# Patient Record
Sex: Female | Born: 1965 | Race: Black or African American | Hispanic: No | Marital: Single | Smoking: Never smoker
Health system: Southern US, Community
[De-identification: ages and names within clinical notes are randomized; demographics above are authoritative.]

## PROBLEM LIST (undated history)

## (undated) ENCOUNTER — Emergency Department (HOSPITAL_COMMUNITY): Admission: EM | Payer: Medicaid Other | Source: Home / Self Care

## (undated) DIAGNOSIS — G4730 Sleep apnea, unspecified: Secondary | ICD-10-CM

## (undated) DIAGNOSIS — T7840XA Allergy, unspecified, initial encounter: Secondary | ICD-10-CM

## (undated) DIAGNOSIS — S62101A Fracture of unspecified carpal bone, right wrist, initial encounter for closed fracture: Secondary | ICD-10-CM

## (undated) DIAGNOSIS — H6690 Otitis media, unspecified, unspecified ear: Secondary | ICD-10-CM

## (undated) DIAGNOSIS — D649 Anemia, unspecified: Secondary | ICD-10-CM

## (undated) DIAGNOSIS — G4733 Obstructive sleep apnea (adult) (pediatric): Secondary | ICD-10-CM

## (undated) DIAGNOSIS — F329 Major depressive disorder, single episode, unspecified: Secondary | ICD-10-CM

## (undated) DIAGNOSIS — R0902 Hypoxemia: Secondary | ICD-10-CM

## (undated) DIAGNOSIS — F419 Anxiety disorder, unspecified: Secondary | ICD-10-CM

## (undated) DIAGNOSIS — I10 Essential (primary) hypertension: Secondary | ICD-10-CM

## (undated) DIAGNOSIS — F32A Depression, unspecified: Secondary | ICD-10-CM

## (undated) DIAGNOSIS — Z87410 Personal history of cervical dysplasia: Secondary | ICD-10-CM

## (undated) DIAGNOSIS — I272 Pulmonary hypertension, unspecified: Secondary | ICD-10-CM

## (undated) DIAGNOSIS — B20 Human immunodeficiency virus [HIV] disease: Secondary | ICD-10-CM

## (undated) DIAGNOSIS — Z8619 Personal history of other infectious and parasitic diseases: Secondary | ICD-10-CM

## (undated) DIAGNOSIS — J45909 Unspecified asthma, uncomplicated: Secondary | ICD-10-CM

## (undated) DIAGNOSIS — I2510 Atherosclerotic heart disease of native coronary artery without angina pectoris: Secondary | ICD-10-CM

## (undated) DIAGNOSIS — K219 Gastro-esophageal reflux disease without esophagitis: Secondary | ICD-10-CM

## (undated) DIAGNOSIS — S62102A Fracture of unspecified carpal bone, left wrist, initial encounter for closed fracture: Secondary | ICD-10-CM

## (undated) DIAGNOSIS — E669 Obesity, unspecified: Secondary | ICD-10-CM

## (undated) DIAGNOSIS — E119 Type 2 diabetes mellitus without complications: Secondary | ICD-10-CM

## (undated) DIAGNOSIS — M1990 Unspecified osteoarthritis, unspecified site: Secondary | ICD-10-CM

## (undated) DIAGNOSIS — E785 Hyperlipidemia, unspecified: Secondary | ICD-10-CM

## (undated) DIAGNOSIS — I5032 Chronic diastolic (congestive) heart failure: Secondary | ICD-10-CM

## (undated) HISTORY — PX: TYMPANOSTOMY TUBE PLACEMENT: SHX32

## (undated) HISTORY — DX: Anemia, unspecified: D64.9

## (undated) HISTORY — DX: Essential (primary) hypertension: I10

## (undated) HISTORY — DX: Anxiety disorder, unspecified: F41.9

## (undated) HISTORY — DX: Gastro-esophageal reflux disease without esophagitis: K21.9

## (undated) HISTORY — DX: Obesity, unspecified: E66.9

## (undated) HISTORY — DX: Otitis media, unspecified, unspecified ear: H66.90

## (undated) HISTORY — DX: Human immunodeficiency virus (HIV) disease: B20

## (undated) HISTORY — DX: Depression, unspecified: F32.A

## (undated) HISTORY — DX: Fracture of unspecified carpal bone, right wrist, initial encounter for closed fracture: S62.102A

## (undated) HISTORY — DX: Hyperlipidemia, unspecified: E78.5

## (undated) HISTORY — DX: Type 2 diabetes mellitus without complications: E11.9

## (undated) HISTORY — DX: Fracture of unspecified carpal bone, right wrist, initial encounter for closed fracture: S62.101A

## (undated) HISTORY — DX: Sleep apnea, unspecified: G47.30

## (undated) HISTORY — DX: Personal history of cervical dysplasia: Z87.410

## (undated) HISTORY — DX: Obstructive sleep apnea (adult) (pediatric): G47.33

## (undated) HISTORY — DX: Chronic diastolic (congestive) heart failure: I50.32

## (undated) HISTORY — DX: Major depressive disorder, single episode, unspecified: F32.9

## (undated) HISTORY — DX: Hypoxemia: R09.02

## (undated) HISTORY — DX: Pulmonary hypertension, unspecified: I27.20

## (undated) HISTORY — DX: Personal history of other infectious and parasitic diseases: Z86.19

## (undated) HISTORY — DX: Unspecified asthma, uncomplicated: J45.909

## (undated) HISTORY — PX: ADENOIDECTOMY: SHX5191

## (undated) HISTORY — DX: Unspecified osteoarthritis, unspecified site: M19.90

## (undated) HISTORY — DX: Allergy, unspecified, initial encounter: T78.40XA

---

## 1999-11-17 DIAGNOSIS — B20 Human immunodeficiency virus [HIV] disease: Secondary | ICD-10-CM

## 1999-11-17 HISTORY — DX: Human immunodeficiency virus (HIV) disease: B20

## 2002-06-21 DIAGNOSIS — B20 Human immunodeficiency virus [HIV] disease: Secondary | ICD-10-CM | POA: Insufficient documentation

## 2002-06-23 ENCOUNTER — Encounter (INDEPENDENT_AMBULATORY_CARE_PROVIDER_SITE_OTHER): Payer: Self-pay | Admitting: *Deleted

## 2002-06-23 LAB — CONVERTED CEMR LAB: CD4 Count: 200 microliters

## 2002-07-11 ENCOUNTER — Encounter: Payer: Self-pay | Admitting: *Deleted

## 2002-07-11 ENCOUNTER — Ambulatory Visit (HOSPITAL_COMMUNITY): Admission: RE | Admit: 2002-07-11 | Discharge: 2002-07-11 | Payer: Self-pay | Admitting: *Deleted

## 2002-07-27 ENCOUNTER — Encounter: Admission: RE | Admit: 2002-07-27 | Discharge: 2002-07-27 | Payer: Self-pay | Admitting: *Deleted

## 2002-07-31 ENCOUNTER — Encounter: Admission: RE | Admit: 2002-07-31 | Discharge: 2002-07-31 | Payer: Self-pay | Admitting: Internal Medicine

## 2002-08-17 ENCOUNTER — Encounter: Admission: RE | Admit: 2002-08-17 | Discharge: 2002-08-17 | Payer: Self-pay | Admitting: *Deleted

## 2002-08-24 ENCOUNTER — Encounter: Admission: RE | Admit: 2002-08-24 | Discharge: 2002-08-24 | Payer: Self-pay | Admitting: *Deleted

## 2002-09-06 ENCOUNTER — Inpatient Hospital Stay (HOSPITAL_COMMUNITY): Admission: RE | Admit: 2002-09-06 | Discharge: 2002-09-06 | Payer: Self-pay | Admitting: *Deleted

## 2002-09-13 ENCOUNTER — Inpatient Hospital Stay (HOSPITAL_COMMUNITY): Admission: AD | Admit: 2002-09-13 | Discharge: 2002-09-19 | Payer: Self-pay | Admitting: *Deleted

## 2002-09-13 ENCOUNTER — Encounter: Admission: RE | Admit: 2002-09-13 | Discharge: 2002-09-13 | Payer: Self-pay | Admitting: *Deleted

## 2002-09-13 ENCOUNTER — Encounter: Payer: Self-pay | Admitting: Obstetrics and Gynecology

## 2002-09-16 ENCOUNTER — Encounter (INDEPENDENT_AMBULATORY_CARE_PROVIDER_SITE_OTHER): Payer: Self-pay | Admitting: Specialist

## 2002-11-16 HISTORY — PX: NASAL SINUS SURGERY: SHX719

## 2002-12-05 ENCOUNTER — Ambulatory Visit (HOSPITAL_COMMUNITY): Admission: RE | Admit: 2002-12-05 | Discharge: 2002-12-05 | Payer: Self-pay | Admitting: Internal Medicine

## 2002-12-05 ENCOUNTER — Encounter: Admission: RE | Admit: 2002-12-05 | Discharge: 2002-12-05 | Payer: Self-pay | Admitting: Internal Medicine

## 2002-12-14 ENCOUNTER — Encounter: Admission: RE | Admit: 2002-12-14 | Discharge: 2002-12-14 | Payer: Self-pay | Admitting: Infectious Diseases

## 2002-12-26 ENCOUNTER — Encounter: Admission: RE | Admit: 2002-12-26 | Discharge: 2002-12-26 | Payer: Self-pay | Admitting: Internal Medicine

## 2003-02-14 ENCOUNTER — Ambulatory Visit (HOSPITAL_COMMUNITY): Admission: RE | Admit: 2003-02-14 | Discharge: 2003-02-15 | Payer: Self-pay | Admitting: Otolaryngology

## 2003-02-14 ENCOUNTER — Encounter (INDEPENDENT_AMBULATORY_CARE_PROVIDER_SITE_OTHER): Payer: Self-pay | Admitting: Specialist

## 2003-05-17 ENCOUNTER — Encounter: Admission: RE | Admit: 2003-05-17 | Discharge: 2003-05-17 | Payer: Self-pay | Admitting: Obstetrics and Gynecology

## 2003-05-17 ENCOUNTER — Encounter (INDEPENDENT_AMBULATORY_CARE_PROVIDER_SITE_OTHER): Payer: Self-pay | Admitting: Specialist

## 2003-06-12 ENCOUNTER — Encounter: Admission: RE | Admit: 2003-06-12 | Discharge: 2003-06-12 | Payer: Self-pay | Admitting: Obstetrics and Gynecology

## 2003-09-12 ENCOUNTER — Ambulatory Visit (HOSPITAL_COMMUNITY): Admission: RE | Admit: 2003-09-12 | Discharge: 2003-09-12 | Payer: Self-pay | Admitting: Infectious Diseases

## 2003-09-12 ENCOUNTER — Encounter: Payer: Self-pay | Admitting: Infectious Diseases

## 2003-09-12 ENCOUNTER — Encounter: Admission: RE | Admit: 2003-09-12 | Discharge: 2003-09-12 | Payer: Self-pay | Admitting: Infectious Diseases

## 2004-01-21 ENCOUNTER — Emergency Department (HOSPITAL_COMMUNITY): Admission: EM | Admit: 2004-01-21 | Discharge: 2004-01-22 | Payer: Self-pay | Admitting: Emergency Medicine

## 2004-06-02 ENCOUNTER — Ambulatory Visit (HOSPITAL_BASED_OUTPATIENT_CLINIC_OR_DEPARTMENT_OTHER): Admission: RE | Admit: 2004-06-02 | Discharge: 2004-06-02 | Payer: Self-pay | Admitting: Otolaryngology

## 2004-06-24 ENCOUNTER — Ambulatory Visit (HOSPITAL_COMMUNITY): Admission: RE | Admit: 2004-06-24 | Discharge: 2004-06-24 | Payer: Self-pay | Admitting: Infectious Diseases

## 2004-06-24 ENCOUNTER — Encounter: Admission: RE | Admit: 2004-06-24 | Discharge: 2004-06-24 | Payer: Self-pay | Admitting: Internal Medicine

## 2004-07-14 ENCOUNTER — Encounter: Admission: RE | Admit: 2004-07-14 | Discharge: 2004-07-14 | Payer: Self-pay | Admitting: Infectious Diseases

## 2004-08-25 ENCOUNTER — Emergency Department (HOSPITAL_COMMUNITY): Admission: EM | Admit: 2004-08-25 | Discharge: 2004-08-25 | Payer: Self-pay | Admitting: Family Medicine

## 2004-08-27 ENCOUNTER — Ambulatory Visit: Payer: Self-pay | Admitting: Infectious Diseases

## 2004-09-29 ENCOUNTER — Ambulatory Visit: Payer: Self-pay | Admitting: Infectious Diseases

## 2004-11-16 HISTORY — PX: WRIST FRACTURE SURGERY: SHX121

## 2004-11-18 ENCOUNTER — Encounter (INDEPENDENT_AMBULATORY_CARE_PROVIDER_SITE_OTHER): Payer: Self-pay | Admitting: *Deleted

## 2004-11-18 ENCOUNTER — Ambulatory Visit: Payer: Self-pay | Admitting: Family Medicine

## 2004-12-26 ENCOUNTER — Ambulatory Visit: Payer: Self-pay | Admitting: Infectious Diseases

## 2005-01-02 ENCOUNTER — Emergency Department (HOSPITAL_COMMUNITY): Admission: EM | Admit: 2005-01-02 | Discharge: 2005-01-02 | Payer: Self-pay | Admitting: Family Medicine

## 2005-01-05 ENCOUNTER — Ambulatory Visit: Payer: Self-pay | Admitting: Infectious Diseases

## 2005-01-05 ENCOUNTER — Encounter (INDEPENDENT_AMBULATORY_CARE_PROVIDER_SITE_OTHER): Payer: Self-pay | Admitting: *Deleted

## 2005-01-05 ENCOUNTER — Ambulatory Visit (HOSPITAL_COMMUNITY): Admission: RE | Admit: 2005-01-05 | Discharge: 2005-01-05 | Payer: Self-pay | Admitting: Infectious Diseases

## 2005-01-05 LAB — CONVERTED CEMR LAB: CD4 Count: 280 microliters

## 2005-02-01 ENCOUNTER — Emergency Department (HOSPITAL_COMMUNITY): Admission: EM | Admit: 2005-02-01 | Discharge: 2005-02-01 | Payer: Self-pay | Admitting: Family Medicine

## 2005-02-16 ENCOUNTER — Inpatient Hospital Stay (HOSPITAL_COMMUNITY): Admission: AC | Admit: 2005-02-16 | Discharge: 2005-02-23 | Payer: Self-pay

## 2005-02-16 ENCOUNTER — Ambulatory Visit: Payer: Self-pay | Admitting: Internal Medicine

## 2005-02-26 ENCOUNTER — Encounter: Admission: RE | Admit: 2005-02-26 | Discharge: 2005-05-27 | Payer: Self-pay | Admitting: Orthopedic Surgery

## 2005-02-27 ENCOUNTER — Emergency Department (HOSPITAL_COMMUNITY): Admission: EM | Admit: 2005-02-27 | Discharge: 2005-02-27 | Payer: Self-pay | Admitting: Family Medicine

## 2005-04-15 ENCOUNTER — Ambulatory Visit: Payer: Self-pay | Admitting: Infectious Diseases

## 2005-05-28 ENCOUNTER — Encounter: Admission: RE | Admit: 2005-05-28 | Discharge: 2005-06-08 | Payer: Self-pay | Admitting: Orthopedic Surgery

## 2005-08-19 ENCOUNTER — Ambulatory Visit (HOSPITAL_COMMUNITY): Admission: RE | Admit: 2005-08-19 | Discharge: 2005-08-19 | Payer: Self-pay | Admitting: Infectious Diseases

## 2005-08-19 ENCOUNTER — Ambulatory Visit: Payer: Self-pay | Admitting: Infectious Diseases

## 2005-08-19 ENCOUNTER — Encounter (INDEPENDENT_AMBULATORY_CARE_PROVIDER_SITE_OTHER): Payer: Self-pay | Admitting: *Deleted

## 2005-09-21 ENCOUNTER — Ambulatory Visit: Payer: Self-pay | Admitting: Infectious Diseases

## 2006-02-22 ENCOUNTER — Ambulatory Visit: Payer: Self-pay | Admitting: Infectious Diseases

## 2007-01-10 ENCOUNTER — Encounter (INDEPENDENT_AMBULATORY_CARE_PROVIDER_SITE_OTHER): Payer: Self-pay | Admitting: *Deleted

## 2007-01-10 LAB — CONVERTED CEMR LAB: Pap Smear: ABNORMAL

## 2007-01-23 ENCOUNTER — Encounter (INDEPENDENT_AMBULATORY_CARE_PROVIDER_SITE_OTHER): Payer: Self-pay | Admitting: *Deleted

## 2007-03-22 ENCOUNTER — Ambulatory Visit: Payer: Self-pay | Admitting: Infectious Diseases

## 2007-03-22 ENCOUNTER — Encounter: Admission: RE | Admit: 2007-03-22 | Discharge: 2007-03-22 | Payer: Self-pay | Admitting: Infectious Diseases

## 2007-03-22 DIAGNOSIS — H6690 Otitis media, unspecified, unspecified ear: Secondary | ICD-10-CM | POA: Insufficient documentation

## 2007-03-22 DIAGNOSIS — I10 Essential (primary) hypertension: Secondary | ICD-10-CM

## 2007-03-22 LAB — CONVERTED CEMR LAB
ALT: 27 units/L (ref 0–35)
CO2: 28 meq/L (ref 19–32)
Cholesterol: 137 mg/dL (ref 0–200)
Creatinine, Ser: 0.72 mg/dL (ref 0.40–1.20)
HCT: 41.4 % (ref 36.0–46.0)
HDL: 34 mg/dL — ABNORMAL LOW (ref >39)
HIV 1 RNA Quant: 8230 copies/mL — ABNORMAL HIGH (ref <50)
MCV: 86.4 fL (ref 78.0–100.0)
Platelets: 227 10*3/uL (ref 150–400)
RDW: 14 % (ref 11.5–14.0)
Total Bilirubin: 0.5 mg/dL (ref 0.3–1.2)
Total CHOL/HDL Ratio: 4
VLDL: 30 mg/dL (ref 0–40)
WBC: 3.5 10*3/uL — ABNORMAL LOW (ref 4.0–10.5)

## 2007-04-20 ENCOUNTER — Telehealth: Payer: Self-pay

## 2007-06-02 ENCOUNTER — Encounter (INDEPENDENT_AMBULATORY_CARE_PROVIDER_SITE_OTHER): Payer: Self-pay | Admitting: *Deleted

## 2007-08-18 ENCOUNTER — Encounter: Payer: Self-pay | Admitting: Infectious Diseases

## 2007-08-24 ENCOUNTER — Encounter: Admission: RE | Admit: 2007-08-24 | Discharge: 2007-08-24 | Payer: Self-pay | Admitting: Family Medicine

## 2007-09-06 ENCOUNTER — Encounter: Payer: Self-pay | Admitting: Infectious Diseases

## 2007-09-06 ENCOUNTER — Ambulatory Visit: Payer: Self-pay | Admitting: Internal Medicine

## 2007-09-06 ENCOUNTER — Encounter: Admission: RE | Admit: 2007-09-06 | Discharge: 2007-09-06 | Payer: Self-pay | Admitting: Internal Medicine

## 2007-09-06 LAB — CONVERTED CEMR LAB
AST: 29 units/L (ref 0–37)
Alkaline Phosphatase: 44 units/L (ref 39–117)
BUN: 12 mg/dL (ref 6–23)
Basophils Relative: 0 % (ref 0–1)
Creatinine, Ser: 0.74 mg/dL (ref 0.40–1.20)
Eosinophils Absolute: 0 10*3/uL (ref 0.0–0.7)
Eosinophils Relative: 1 % (ref 0–5)
HCT: 37.8 % (ref 36.0–46.0)
HIV-1 RNA Quant, Log: 4.45 — ABNORMAL HIGH (ref <1.70)
Hemoglobin: 12 g/dL (ref 12.0–15.0)
MCHC: 31.7 g/dL (ref 30.0–36.0)
MCV: 88.3 fL (ref 78.0–100.0)
Monocytes Absolute: 0.3 10*3/uL (ref 0.2–0.7)
Monocytes Relative: 10 % (ref 3–11)
Neutrophils Relative %: 43 % (ref 43–77)
RBC: 4.28 M/uL (ref 3.87–5.11)

## 2007-09-15 ENCOUNTER — Emergency Department (HOSPITAL_COMMUNITY): Admission: EM | Admit: 2007-09-15 | Discharge: 2007-09-15 | Payer: Self-pay | Admitting: Emergency Medicine

## 2007-09-15 DIAGNOSIS — B029 Zoster without complications: Secondary | ICD-10-CM | POA: Insufficient documentation

## 2007-09-20 ENCOUNTER — Ambulatory Visit: Payer: Self-pay | Admitting: Infectious Diseases

## 2007-10-06 ENCOUNTER — Emergency Department (HOSPITAL_COMMUNITY): Admission: EM | Admit: 2007-10-06 | Discharge: 2007-10-07 | Payer: Self-pay | Admitting: Emergency Medicine

## 2007-11-04 ENCOUNTER — Encounter: Payer: Self-pay | Admitting: Internal Medicine

## 2007-12-19 ENCOUNTER — Encounter: Admission: RE | Admit: 2007-12-19 | Discharge: 2007-12-19 | Payer: Self-pay | Admitting: Infectious Diseases

## 2007-12-19 ENCOUNTER — Ambulatory Visit: Payer: Self-pay | Admitting: Infectious Diseases

## 2007-12-19 ENCOUNTER — Encounter: Payer: Self-pay | Admitting: Internal Medicine

## 2007-12-19 DIAGNOSIS — N879 Dysplasia of cervix uteri, unspecified: Secondary | ICD-10-CM | POA: Insufficient documentation

## 2007-12-19 LAB — CONVERTED CEMR LAB
CO2: 28 meq/L (ref 19–32)
Calcium: 9.1 mg/dL (ref 8.4–10.5)
Chloride: 101 meq/L (ref 96–112)
Cholesterol: 137 mg/dL (ref 0–200)
Creatinine, Ser: 0.75 mg/dL (ref 0.40–1.20)
Glucose, Bld: 97 mg/dL (ref 70–99)
HCT: 41.6 % (ref 36.0–46.0)
MCV: 87.4 fL (ref 78.0–100.0)
RBC: 4.76 M/uL (ref 3.87–5.11)
Sodium: 140 meq/L (ref 135–145)
Total Bilirubin: 0.4 mg/dL (ref 0.3–1.2)
Total Protein: 8.7 g/dL — ABNORMAL HIGH (ref 6.0–8.3)
Triglycerides: 111 mg/dL (ref <150)
VLDL: 22 mg/dL (ref 0–40)
WBC: 3.1 10*3/uL — ABNORMAL LOW (ref 4.0–10.5)

## 2007-12-21 ENCOUNTER — Encounter: Payer: Self-pay | Admitting: Infectious Diseases

## 2007-12-21 LAB — CONVERTED CEMR LAB: HIV 1 RNA Quant: 33400 copies/mL — ABNORMAL HIGH (ref <50)

## 2008-02-29 ENCOUNTER — Ambulatory Visit: Payer: Self-pay | Admitting: Infectious Diseases

## 2008-02-29 ENCOUNTER — Encounter: Payer: Self-pay | Admitting: Internal Medicine

## 2008-02-29 ENCOUNTER — Encounter: Admission: RE | Admit: 2008-02-29 | Discharge: 2008-02-29 | Payer: Self-pay | Admitting: Infectious Diseases

## 2008-02-29 LAB — CONVERTED CEMR LAB
Albumin: 3.9 g/dL (ref 3.5–5.2)
Alkaline Phosphatase: 50 units/L (ref 39–117)
BUN: 13 mg/dL (ref 6–23)
CO2: 26 meq/L (ref 19–32)
Calcium: 9.3 mg/dL (ref 8.4–10.5)
Chloride: 103 meq/L (ref 96–112)
Eosinophils Absolute: 0.1 10*3/uL (ref 0.0–0.7)
Glucose, Bld: 88 mg/dL (ref 70–99)
HIV 1 RNA Quant: 31700 copies/mL — ABNORMAL HIGH (ref <50)
HIV-1 RNA Quant, Log: 4.5 — ABNORMAL HIGH (ref <1.70)
Lymphocytes Relative: 44 % (ref 12–46)
Lymphs Abs: 1.3 10*3/uL (ref 0.7–4.0)
MCV: 87.2 fL (ref 78.0–100.0)
Monocytes Relative: 11 % (ref 3–12)
Neutro Abs: 1.2 10*3/uL — ABNORMAL LOW (ref 1.7–7.7)
Neutrophils Relative %: 43 % (ref 43–77)
Platelets: 242 10*3/uL (ref 150–400)
Potassium: 3.8 meq/L (ref 3.5–5.3)
RBC: 4.76 M/uL (ref 3.87–5.11)
Sodium: 139 meq/L (ref 135–145)
Total Protein: 8.7 g/dL — ABNORMAL HIGH (ref 6.0–8.3)
WBC: 2.8 10*3/uL — ABNORMAL LOW (ref 4.0–10.5)

## 2008-03-14 ENCOUNTER — Ambulatory Visit: Payer: Self-pay | Admitting: Infectious Diseases

## 2008-03-14 DIAGNOSIS — J309 Allergic rhinitis, unspecified: Secondary | ICD-10-CM

## 2008-03-14 LAB — CONVERTED CEMR LAB: Pap Smear: NORMAL

## 2008-03-19 ENCOUNTER — Telehealth: Payer: Self-pay | Admitting: Internal Medicine

## 2008-03-20 ENCOUNTER — Encounter: Payer: Self-pay | Admitting: Licensed Clinical Social Worker

## 2008-03-21 ENCOUNTER — Encounter: Payer: Self-pay | Admitting: Infectious Diseases

## 2008-03-28 ENCOUNTER — Encounter: Payer: Self-pay | Admitting: Internal Medicine

## 2008-03-30 ENCOUNTER — Encounter: Payer: Self-pay | Admitting: Internal Medicine

## 2008-04-04 ENCOUNTER — Ambulatory Visit: Payer: Self-pay | Admitting: Internal Medicine

## 2008-04-17 ENCOUNTER — Telehealth: Payer: Self-pay | Admitting: Internal Medicine

## 2008-04-17 ENCOUNTER — Emergency Department (HOSPITAL_COMMUNITY): Admission: EM | Admit: 2008-04-17 | Discharge: 2008-04-17 | Payer: Self-pay | Admitting: Emergency Medicine

## 2008-04-18 ENCOUNTER — Telehealth (INDEPENDENT_AMBULATORY_CARE_PROVIDER_SITE_OTHER): Payer: Self-pay | Admitting: *Deleted

## 2008-05-07 ENCOUNTER — Ambulatory Visit: Payer: Self-pay | Admitting: Internal Medicine

## 2008-05-07 ENCOUNTER — Encounter: Admission: RE | Admit: 2008-05-07 | Discharge: 2008-05-07 | Payer: Self-pay | Admitting: Internal Medicine

## 2008-05-07 LAB — CONVERTED CEMR LAB
ALT: 18 units/L (ref 0–35)
AST: 27 units/L (ref 0–37)
Alkaline Phosphatase: 49 units/L (ref 39–117)
BUN: 11 mg/dL (ref 6–23)
Basophils Absolute: 0 10*3/uL (ref 0.0–0.1)
Basophils Relative: 1 % (ref 0–1)
Creatinine, Ser: 0.65 mg/dL (ref 0.40–1.20)
Eosinophils Absolute: 0.1 10*3/uL (ref 0.0–0.7)
MCHC: 32 g/dL (ref 30.0–36.0)
MCV: 85.6 fL (ref 78.0–100.0)
Monocytes Absolute: 0.3 10*3/uL (ref 0.1–1.0)
Monocytes Relative: 9 % (ref 3–12)
Neutro Abs: 1.6 10*3/uL — ABNORMAL LOW (ref 1.7–7.7)
Neutrophils Relative %: 48 % (ref 43–77)
Potassium: 3.7 meq/L (ref 3.5–5.3)
RBC: 4.23 M/uL (ref 3.87–5.11)
RDW: 14.1 % (ref 11.5–15.5)

## 2008-05-14 ENCOUNTER — Telehealth: Payer: Self-pay | Admitting: Internal Medicine

## 2008-09-17 ENCOUNTER — Encounter: Admission: RE | Admit: 2008-09-17 | Discharge: 2008-09-17 | Payer: Self-pay | Admitting: Infectious Diseases

## 2008-09-19 ENCOUNTER — Encounter: Payer: Self-pay | Admitting: Infectious Diseases

## 2008-09-19 ENCOUNTER — Ambulatory Visit: Payer: Self-pay | Admitting: Internal Medicine

## 2008-09-19 LAB — CONVERTED CEMR LAB
Albumin: 3.9 g/dL (ref 3.5–5.2)
Alkaline Phosphatase: 56 units/L (ref 39–117)
BUN: 8 mg/dL (ref 6–23)
Creatinine, Ser: 0.61 mg/dL (ref 0.40–1.20)
Eosinophils Absolute: 0 10*3/uL (ref 0.0–0.7)
Eosinophils Relative: 1 % (ref 0–5)
Glucose, Bld: 82 mg/dL (ref 70–99)
HCT: 41.1 % (ref 36.0–46.0)
HIV-1 RNA Quant, Log: 4.59 — ABNORMAL HIGH (ref <1.68)
Lymphs Abs: 1.1 10*3/uL (ref 0.7–4.0)
MCHC: 32.4 g/dL (ref 30.0–36.0)
MCV: 88.2 fL (ref 78.0–100.0)
Monocytes Absolute: 0.3 10*3/uL (ref 0.1–1.0)
Monocytes Relative: 11 % (ref 3–12)
RBC: 4.66 M/uL (ref 3.87–5.11)
Total Bilirubin: 0.4 mg/dL (ref 0.3–1.2)
WBC: 2.5 10*3/uL — ABNORMAL LOW (ref 4.0–10.5)

## 2008-10-02 ENCOUNTER — Ambulatory Visit: Payer: Self-pay | Admitting: Internal Medicine

## 2008-11-16 ENCOUNTER — Encounter: Payer: Self-pay | Admitting: Pulmonary Disease

## 2009-02-04 ENCOUNTER — Encounter: Payer: Self-pay | Admitting: Infectious Diseases

## 2009-02-08 ENCOUNTER — Ambulatory Visit: Payer: Self-pay | Admitting: Internal Medicine

## 2009-02-08 LAB — CONVERTED CEMR LAB
ALT: 21 units/L (ref 0–35)
Basophils Absolute: 0 10*3/uL (ref 0.0–0.1)
Basophils Relative: 1 % (ref 0–1)
CO2: 27 meq/L (ref 19–32)
Chloride: 100 meq/L (ref 96–112)
Eosinophils Relative: 2 % (ref 0–5)
GFR calc Af Amer: >60 mL/min (ref >60)
HIV 1 RNA Quant: 17700 copies/mL — ABNORMAL HIGH (ref <48)
Hemoglobin: 13.9 g/dL (ref 12.0–15.0)
LDL Cholesterol: 81 mg/dL (ref 0–99)
MCHC: 33.6 g/dL (ref 30.0–36.0)
Monocytes Absolute: 0.2 10*3/uL (ref 0.1–1.0)
Neutro Abs: 1.2 10*3/uL — ABNORMAL LOW (ref 1.7–7.7)
Potassium: 4.2 meq/L (ref 3.5–5.3)
RDW: 13.6 % (ref 11.5–15.5)
Sodium: 139 meq/L (ref 135–145)
Total Bilirubin: 0.4 mg/dL (ref 0.3–1.2)
Total Protein: 9.2 g/dL — ABNORMAL HIGH (ref 6.0–8.3)
VLDL: 33 mg/dL (ref 0–40)

## 2009-02-13 ENCOUNTER — Telehealth (INDEPENDENT_AMBULATORY_CARE_PROVIDER_SITE_OTHER): Payer: Self-pay | Admitting: *Deleted

## 2009-02-22 ENCOUNTER — Ambulatory Visit: Payer: Self-pay | Admitting: Internal Medicine

## 2009-08-19 ENCOUNTER — Encounter: Payer: Self-pay | Admitting: Infectious Diseases

## 2010-01-20 ENCOUNTER — Encounter: Payer: Self-pay | Admitting: *Deleted

## 2010-01-20 ENCOUNTER — Emergency Department (HOSPITAL_COMMUNITY): Admission: EM | Admit: 2010-01-20 | Discharge: 2010-01-20 | Payer: Self-pay | Admitting: Family Medicine

## 2010-04-24 ENCOUNTER — Encounter: Payer: Self-pay | Admitting: Infectious Diseases

## 2010-04-24 ENCOUNTER — Ambulatory Visit: Payer: Self-pay | Admitting: Internal Medicine

## 2010-04-24 LAB — CONVERTED CEMR LAB
ALT: 18 units/L (ref 0–35)
Alkaline Phosphatase: 58 units/L (ref 39–117)
Basophils Absolute: 0 10*3/uL (ref 0.0–0.1)
Basophils Relative: 1 % (ref 0–1)
CO2: 31 meq/L (ref 19–32)
Cholesterol: 133 mg/dL (ref 0–200)
Creatinine, Ser: 0.78 mg/dL (ref 0.40–1.20)
Eosinophils Absolute: 0.1 10*3/uL (ref 0.0–0.7)
HIV 1 RNA Quant: 40400 copies/mL — ABNORMAL HIGH (ref <48)
LDL Cholesterol: 59 mg/dL (ref 0–99)
MCHC: 32.2 g/dL (ref 30.0–36.0)
MCV: 88.8 fL (ref 78.0–100.0)
Monocytes Relative: 12 % (ref 3–12)
Neutro Abs: 1.1 10*3/uL — ABNORMAL LOW (ref 1.7–7.7)
Neutrophils Relative %: 33 % — ABNORMAL LOW (ref 43–77)
Platelets: 180 10*3/uL (ref 150–400)
RDW: 14.4 % (ref 11.5–15.5)
Sodium: 138 meq/L (ref 135–145)
Total Bilirubin: 0.4 mg/dL (ref 0.3–1.2)
Total CHOL/HDL Ratio: 3.2
Total Protein: 8.8 g/dL — ABNORMAL HIGH (ref 6.0–8.3)
Triglycerides: 159 mg/dL — ABNORMAL HIGH (ref <150)
VLDL: 32 mg/dL (ref 0–40)

## 2010-05-08 ENCOUNTER — Ambulatory Visit: Payer: Self-pay | Admitting: Internal Medicine

## 2010-05-08 ENCOUNTER — Inpatient Hospital Stay (HOSPITAL_COMMUNITY): Admission: EM | Admit: 2010-05-08 | Discharge: 2010-05-14 | Payer: Self-pay | Admitting: Emergency Medicine

## 2010-05-08 ENCOUNTER — Ambulatory Visit: Payer: Self-pay | Admitting: Cardiology

## 2010-05-08 ENCOUNTER — Encounter: Payer: Self-pay | Admitting: Internal Medicine

## 2010-05-08 ENCOUNTER — Ambulatory Visit: Payer: Self-pay | Admitting: Pulmonary Disease

## 2010-05-08 DIAGNOSIS — R0602 Shortness of breath: Secondary | ICD-10-CM

## 2010-05-09 ENCOUNTER — Encounter (INDEPENDENT_AMBULATORY_CARE_PROVIDER_SITE_OTHER): Payer: Self-pay | Admitting: *Deleted

## 2010-05-09 ENCOUNTER — Encounter: Payer: Self-pay | Admitting: Internal Medicine

## 2010-05-26 ENCOUNTER — Encounter (INDEPENDENT_AMBULATORY_CARE_PROVIDER_SITE_OTHER): Payer: Self-pay | Admitting: *Deleted

## 2010-05-28 ENCOUNTER — Ambulatory Visit: Payer: Self-pay | Admitting: Internal Medicine

## 2010-05-28 DIAGNOSIS — I272 Pulmonary hypertension, unspecified: Secondary | ICD-10-CM

## 2010-05-29 ENCOUNTER — Ambulatory Visit: Payer: Self-pay | Admitting: Internal Medicine

## 2010-05-30 ENCOUNTER — Telehealth: Payer: Self-pay | Admitting: Internal Medicine

## 2010-05-30 ENCOUNTER — Encounter: Payer: Self-pay | Admitting: Internal Medicine

## 2010-06-03 ENCOUNTER — Telehealth: Payer: Self-pay | Admitting: Internal Medicine

## 2010-06-04 ENCOUNTER — Encounter (INDEPENDENT_AMBULATORY_CARE_PROVIDER_SITE_OTHER): Payer: Self-pay | Admitting: *Deleted

## 2010-06-09 ENCOUNTER — Encounter: Payer: Self-pay | Admitting: Infectious Diseases

## 2010-06-13 ENCOUNTER — Encounter (INDEPENDENT_AMBULATORY_CARE_PROVIDER_SITE_OTHER): Payer: Self-pay | Admitting: *Deleted

## 2010-06-17 ENCOUNTER — Telehealth (INDEPENDENT_AMBULATORY_CARE_PROVIDER_SITE_OTHER): Payer: Self-pay | Admitting: *Deleted

## 2010-06-17 ENCOUNTER — Encounter (INDEPENDENT_AMBULATORY_CARE_PROVIDER_SITE_OTHER): Payer: Self-pay | Admitting: *Deleted

## 2010-06-20 ENCOUNTER — Encounter: Payer: Self-pay | Admitting: Infectious Diseases

## 2010-06-20 ENCOUNTER — Telehealth: Payer: Self-pay | Admitting: Infectious Diseases

## 2010-06-23 ENCOUNTER — Ambulatory Visit: Payer: Self-pay | Admitting: Internal Medicine

## 2010-06-23 ENCOUNTER — Encounter (INDEPENDENT_AMBULATORY_CARE_PROVIDER_SITE_OTHER): Payer: Self-pay | Admitting: *Deleted

## 2010-06-23 ENCOUNTER — Ambulatory Visit (HOSPITAL_COMMUNITY): Admission: RE | Admit: 2010-06-23 | Discharge: 2010-06-23 | Payer: Self-pay | Admitting: Internal Medicine

## 2010-06-23 ENCOUNTER — Telehealth: Payer: Self-pay | Admitting: Internal Medicine

## 2010-06-25 ENCOUNTER — Telehealth: Payer: Self-pay | Admitting: Internal Medicine

## 2010-06-27 ENCOUNTER — Ambulatory Visit: Payer: Self-pay | Admitting: Pulmonary Disease

## 2010-06-27 DIAGNOSIS — G4733 Obstructive sleep apnea (adult) (pediatric): Secondary | ICD-10-CM | POA: Insufficient documentation

## 2010-07-01 ENCOUNTER — Ambulatory Visit: Payer: Self-pay | Admitting: Internal Medicine

## 2010-07-01 ENCOUNTER — Encounter (INDEPENDENT_AMBULATORY_CARE_PROVIDER_SITE_OTHER): Payer: Self-pay | Admitting: *Deleted

## 2010-07-01 DIAGNOSIS — E66811 Obesity, class 1: Secondary | ICD-10-CM | POA: Insufficient documentation

## 2010-07-01 DIAGNOSIS — E669 Obesity, unspecified: Secondary | ICD-10-CM | POA: Insufficient documentation

## 2010-07-04 ENCOUNTER — Telehealth (INDEPENDENT_AMBULATORY_CARE_PROVIDER_SITE_OTHER): Payer: Self-pay | Admitting: *Deleted

## 2010-07-04 ENCOUNTER — Ambulatory Visit: Payer: Self-pay | Admitting: Internal Medicine

## 2010-07-04 ENCOUNTER — Encounter (INDEPENDENT_AMBULATORY_CARE_PROVIDER_SITE_OTHER): Payer: Self-pay | Admitting: *Deleted

## 2010-07-04 ENCOUNTER — Telehealth: Payer: Self-pay | Admitting: Internal Medicine

## 2010-07-04 LAB — CONVERTED CEMR LAB
ALT: 18 units/L (ref 0–35)
AST: 28 units/L (ref 0–37)
Alkaline Phosphatase: 59 units/L (ref 39–117)
Basophils Absolute: 0 10*3/uL (ref 0.0–0.1)
Basophils Relative: 1 % (ref 0–1)
Creatinine, Ser: 0.79 mg/dL (ref 0.40–1.20)
Eosinophils Relative: 3 % (ref 0–5)
HCT: 45.6 % (ref 36.0–46.0)
Hemoglobin: 13.9 g/dL (ref 12.0–15.0)
MCHC: 30.5 g/dL (ref 30.0–36.0)
Monocytes Absolute: 0.3 10*3/uL (ref 0.1–1.0)
Platelets: 160 10*3/uL (ref 150–400)
RDW: 15.8 % — ABNORMAL HIGH (ref 11.5–15.5)
Sodium: 139 meq/L (ref 135–145)
Total Bilirubin: 0.4 mg/dL (ref 0.3–1.2)

## 2010-07-08 ENCOUNTER — Encounter: Payer: Self-pay | Admitting: Pulmonary Disease

## 2010-07-09 ENCOUNTER — Encounter: Payer: Self-pay | Admitting: Pulmonary Disease

## 2010-07-10 ENCOUNTER — Ambulatory Visit: Payer: Self-pay | Admitting: Internal Medicine

## 2010-07-10 ENCOUNTER — Encounter: Payer: Self-pay | Admitting: Pulmonary Disease

## 2010-07-10 ENCOUNTER — Telehealth: Payer: Self-pay | Admitting: Pulmonary Disease

## 2010-07-10 DIAGNOSIS — E662 Morbid (severe) obesity with alveolar hypoventilation: Secondary | ICD-10-CM | POA: Insufficient documentation

## 2010-07-10 LAB — CONVERTED CEMR LAB
Alkaline Phosphatase: 62 units/L (ref 39–117)
Basophils Relative: 2 % — ABNORMAL HIGH (ref 0–1)
Eosinophils Absolute: 0.1 10*3/uL (ref 0.0–0.7)
Eosinophils Relative: 3 % (ref 0–5)
Glucose, Bld: 56 mg/dL — ABNORMAL LOW (ref 70–99)
HCT: 44.3 % (ref 36.0–46.0)
HIV-1 RNA Quant, Log: 4.44 — ABNORMAL HIGH (ref <1.68)
Lymphs Abs: 1.4 10*3/uL (ref 0.7–4.0)
MCHC: 30.5 g/dL (ref 30.0–36.0)
MCV: 91.7 fL (ref 78.0–100.0)
Platelets: 169 10*3/uL (ref 150–400)
RDW: 15.6 % — ABNORMAL HIGH (ref 11.5–15.5)
Sodium: 140 meq/L (ref 135–145)
Total Bilirubin: 0.4 mg/dL (ref 0.3–1.2)
Total Protein: 8.3 g/dL (ref 6.0–8.3)

## 2010-07-14 ENCOUNTER — Encounter: Payer: Self-pay | Admitting: Internal Medicine

## 2010-07-14 ENCOUNTER — Encounter: Payer: Self-pay | Admitting: Pulmonary Disease

## 2010-07-14 ENCOUNTER — Encounter: Payer: Self-pay | Admitting: Infectious Diseases

## 2010-07-15 ENCOUNTER — Encounter (INDEPENDENT_AMBULATORY_CARE_PROVIDER_SITE_OTHER): Payer: Self-pay | Admitting: *Deleted

## 2010-07-17 ENCOUNTER — Telehealth (INDEPENDENT_AMBULATORY_CARE_PROVIDER_SITE_OTHER): Payer: Self-pay | Admitting: *Deleted

## 2010-07-18 ENCOUNTER — Telehealth (INDEPENDENT_AMBULATORY_CARE_PROVIDER_SITE_OTHER): Payer: Self-pay | Admitting: *Deleted

## 2010-07-24 ENCOUNTER — Telehealth: Payer: Self-pay | Admitting: Pulmonary Disease

## 2010-07-25 ENCOUNTER — Telehealth (INDEPENDENT_AMBULATORY_CARE_PROVIDER_SITE_OTHER): Payer: Self-pay | Admitting: *Deleted

## 2010-07-29 ENCOUNTER — Ambulatory Visit: Payer: Self-pay | Admitting: Internal Medicine

## 2010-07-29 ENCOUNTER — Encounter: Payer: Self-pay | Admitting: Infectious Diseases

## 2010-08-27 ENCOUNTER — Ambulatory Visit (HOSPITAL_BASED_OUTPATIENT_CLINIC_OR_DEPARTMENT_OTHER)
Admission: RE | Admit: 2010-08-27 | Discharge: 2010-08-27 | Payer: Self-pay | Source: Home / Self Care | Admitting: Pulmonary Disease

## 2010-08-27 ENCOUNTER — Telehealth: Payer: Self-pay | Admitting: Pulmonary Disease

## 2010-09-03 ENCOUNTER — Encounter (INDEPENDENT_AMBULATORY_CARE_PROVIDER_SITE_OTHER): Payer: Self-pay | Admitting: *Deleted

## 2010-09-16 ENCOUNTER — Encounter (INDEPENDENT_AMBULATORY_CARE_PROVIDER_SITE_OTHER): Payer: Self-pay | Admitting: *Deleted

## 2010-09-16 ENCOUNTER — Ambulatory Visit: Payer: Self-pay | Admitting: Internal Medicine

## 2010-09-18 ENCOUNTER — Encounter
Admission: RE | Admit: 2010-09-18 | Discharge: 2010-09-18 | Payer: Self-pay | Source: Home / Self Care | Attending: Internal Medicine | Admitting: Internal Medicine

## 2010-09-25 ENCOUNTER — Encounter (INDEPENDENT_AMBULATORY_CARE_PROVIDER_SITE_OTHER): Payer: Self-pay | Admitting: *Deleted

## 2010-09-26 ENCOUNTER — Ambulatory Visit (HOSPITAL_BASED_OUTPATIENT_CLINIC_OR_DEPARTMENT_OTHER)
Admission: RE | Admit: 2010-09-26 | Discharge: 2010-09-26 | Payer: Self-pay | Source: Home / Self Care | Admitting: Pulmonary Disease

## 2010-09-26 ENCOUNTER — Encounter: Payer: Self-pay | Admitting: Pulmonary Disease

## 2010-10-06 ENCOUNTER — Encounter (INDEPENDENT_AMBULATORY_CARE_PROVIDER_SITE_OTHER): Payer: Self-pay | Admitting: *Deleted

## 2010-10-08 ENCOUNTER — Ambulatory Visit: Payer: Self-pay | Admitting: Pulmonary Disease

## 2010-10-14 ENCOUNTER — Telehealth: Payer: Self-pay | Admitting: Pulmonary Disease

## 2010-10-15 ENCOUNTER — Ambulatory Visit: Payer: Self-pay | Admitting: Internal Medicine

## 2010-10-15 ENCOUNTER — Telehealth (INDEPENDENT_AMBULATORY_CARE_PROVIDER_SITE_OTHER): Payer: Self-pay | Admitting: *Deleted

## 2010-10-17 ENCOUNTER — Ambulatory Visit: Payer: Self-pay | Admitting: Pulmonary Disease

## 2010-10-20 ENCOUNTER — Encounter (INDEPENDENT_AMBULATORY_CARE_PROVIDER_SITE_OTHER): Payer: Self-pay | Admitting: *Deleted

## 2010-10-20 ENCOUNTER — Telehealth: Payer: Self-pay | Admitting: Internal Medicine

## 2010-10-22 ENCOUNTER — Encounter: Payer: Self-pay | Admitting: Internal Medicine

## 2010-10-24 LAB — CONVERTED CEMR LAB
CO2: 37 meq/L — ABNORMAL HIGH (ref 19–32)
Chloride: 96 meq/L (ref 96–112)
Creatinine, Ser: 0.7 mg/dL (ref 0.4–1.2)
Sodium: 138 meq/L (ref 135–145)

## 2010-10-28 ENCOUNTER — Encounter: Payer: Self-pay | Admitting: Internal Medicine

## 2010-10-28 ENCOUNTER — Telehealth (INDEPENDENT_AMBULATORY_CARE_PROVIDER_SITE_OTHER): Payer: Self-pay | Admitting: *Deleted

## 2010-10-30 ENCOUNTER — Ambulatory Visit: Payer: Self-pay | Admitting: Internal Medicine

## 2010-10-30 ENCOUNTER — Encounter: Payer: Self-pay | Admitting: Internal Medicine

## 2010-10-30 LAB — CONVERTED CEMR LAB
AST: 28 units/L (ref 0–37)
Albumin: 3.7 g/dL (ref 3.5–5.2)
Alkaline Phosphatase: 55 units/L (ref 39–117)
Basophils Absolute: 0 10*3/uL (ref 0.0–0.1)
Basophils Relative: 0 % (ref 0–1)
Eosinophils Absolute: 0.1 10*3/uL (ref 0.0–0.7)
Hemoglobin: 14.4 g/dL (ref 12.0–15.0)
MCHC: 31.6 g/dL (ref 30.0–36.0)
MCV: 89.4 fL (ref 78.0–100.0)
Monocytes Absolute: 0.3 10*3/uL (ref 0.1–1.0)
Neutro Abs: 0.9 10*3/uL — ABNORMAL LOW (ref 1.7–7.7)
Neutrophils Relative %: 34 % — ABNORMAL LOW (ref 43–77)
Potassium: 4.2 meq/L (ref 3.5–5.3)
RDW: 15.7 % — ABNORMAL HIGH (ref 11.5–15.5)
Sodium: 138 meq/L (ref 135–145)
Total Bilirubin: 0.3 mg/dL (ref 0.3–1.2)
Total Protein: 8.6 g/dL — ABNORMAL HIGH (ref 6.0–8.3)

## 2010-11-03 ENCOUNTER — Telehealth (INDEPENDENT_AMBULATORY_CARE_PROVIDER_SITE_OTHER): Payer: Self-pay | Admitting: *Deleted

## 2010-11-04 ENCOUNTER — Ambulatory Visit: Payer: Self-pay | Admitting: Physician Assistant

## 2010-11-12 ENCOUNTER — Encounter: Payer: Self-pay | Admitting: Infectious Diseases

## 2010-11-12 ENCOUNTER — Encounter: Payer: Self-pay | Admitting: Internal Medicine

## 2010-11-12 ENCOUNTER — Ambulatory Visit: Payer: Self-pay | Admitting: Internal Medicine

## 2010-11-19 ENCOUNTER — Encounter: Payer: Self-pay | Admitting: Pulmonary Disease

## 2010-11-24 ENCOUNTER — Encounter: Payer: Self-pay | Admitting: Internal Medicine

## 2010-11-24 ENCOUNTER — Ambulatory Visit
Admission: RE | Admit: 2010-11-24 | Discharge: 2010-11-24 | Payer: Self-pay | Source: Home / Self Care | Attending: Internal Medicine | Admitting: Internal Medicine

## 2010-11-24 ENCOUNTER — Other Ambulatory Visit: Payer: Self-pay | Admitting: Internal Medicine

## 2010-11-24 LAB — BASIC METABOLIC PANEL
BUN: 9 mg/dL (ref 6–23)
CO2: 28 mEq/L (ref 19–32)
Calcium: 9.4 mg/dL (ref 8.4–10.5)
Chloride: 100 mEq/L (ref 96–112)
Creatinine, Ser: 0.5 mg/dL (ref 0.4–1.2)
GFR: 185.66 mL/min (ref >60.00)
Glucose, Bld: 81 mg/dL (ref 70–99)
Potassium: 4.2 mEq/L (ref 3.5–5.1)
Sodium: 136 mEq/L (ref 135–145)

## 2010-11-24 LAB — BRAIN NATRIURETIC PEPTIDE: Pro B Natriuretic peptide (BNP): 19.4 pg/mL (ref 0.0–100.0)

## 2010-11-25 ENCOUNTER — Encounter
Admission: RE | Admit: 2010-11-25 | Discharge: 2010-11-25 | Payer: Self-pay | Source: Home / Self Care | Attending: Internal Medicine | Admitting: Internal Medicine

## 2010-11-25 ENCOUNTER — Encounter: Payer: Self-pay | Admitting: Pulmonary Disease

## 2010-11-25 ENCOUNTER — Telehealth (INDEPENDENT_AMBULATORY_CARE_PROVIDER_SITE_OTHER): Payer: Self-pay | Admitting: *Deleted

## 2010-11-26 ENCOUNTER — Encounter: Payer: Self-pay | Admitting: Cardiology

## 2010-11-26 ENCOUNTER — Ambulatory Visit: Admit: 2010-11-26 | Payer: Self-pay | Admitting: Internal Medicine

## 2010-11-26 ENCOUNTER — Encounter: Payer: Self-pay | Admitting: *Deleted

## 2010-11-26 ENCOUNTER — Ambulatory Visit: Admission: RE | Admit: 2010-11-26 | Discharge: 2010-11-26 | Payer: Self-pay | Source: Home / Self Care

## 2010-11-26 ENCOUNTER — Encounter (HOSPITAL_COMMUNITY)
Admission: RE | Admit: 2010-11-26 | Discharge: 2010-12-16 | Payer: Self-pay | Source: Home / Self Care | Attending: Internal Medicine | Admitting: Internal Medicine

## 2010-12-02 ENCOUNTER — Emergency Department (HOSPITAL_COMMUNITY)
Admission: EM | Admit: 2010-12-02 | Discharge: 2010-12-02 | Payer: Self-pay | Source: Home / Self Care | Admitting: Family Medicine

## 2010-12-10 ENCOUNTER — Encounter: Payer: Self-pay | Admitting: Pulmonary Disease

## 2010-12-16 NOTE — Miscellaneous (Signed)
Summary: Bridge Counselor  Clinical Lists C BC referred pt to [employer] and [employer] today for ongoing case management, mental health counseling and medication management. Once pt is enrolled for their services, BC will close pt's file for bridge counseling.

## 2010-12-16 NOTE — Miscellaneous (Signed)
Summary: went to UC  Clinical Lists Changes she is aT UC for her lip ring being caught. she has never been here. gave one time ok. told caller to ask her to get her card changed to Dr. Ninetta Lights since this is who shows as pcp.Golden Circle RN  January 20, 2010 10:39 AM  Appended Document: Orders Update    Clinical Lists Changes  Orders: Added new Test order of T-CBC w/Diff [phone]) - Signed Added new Test order of T-CD4SP [facility]) (CD4SP) - Signed Added new Test order of T-Comprehensive Metabolic Panel [phone]) - Signed Added new Test order of T-HIV Viral Load [phone]) - Signed Added new Test order of T-Lipid Profile ([id number]) - Signed Added new Test order of T-RPR (Syphilis) ([id number]) - Signed      Process Orders Check Orders Results:     [facility]: ABN not required for this insurance Order queued for requisitioning for [facility]: April 24, 2010 3:20 PM  Tests Sent for requisitioning (April 24, 2010 3:20 PM):     04/24/2010: [facility] -- T-CBC w/Diff [[id number]] (signed)     04/24/2010: [facility] -- T-Comprehensive Metabolic Panel [[id number]] (signed)     04/24/2010: [facility] -- T-HIV Viral Load [phone] (signed)     04/24/2010: [facility] -- T-Lipid Profile [phone] (signed)     04/24/2010: [facility] -- T-RPR (Syphilis) [phone] (signed)

## 2010-12-16 NOTE — Miscellaneous (Signed)
Summary: [facility] [facility]   Imported By: Florinda Marker 06/23/2010 09:11:24  _____________________________________________________________________  External Attachment:    Type:   Image     Comment:   External Document

## 2010-12-16 NOTE — Miscellaneous (Signed)
Summary: Bridge Counselor  Clinical Lists Changes BC transported pt to Dr. Marchelle Gearing today. Pt is to follow up with Dr. Craige Cotta for her sleep evaluation. Pt is also to see a nutrtionist and begin pulmonary rehab to lose weight. BC will assist pt with sheduling and attending all appointments.  Sharol Roussel  July 03, 2010 9:31 AM

## 2010-12-16 NOTE — Miscellaneous (Signed)
Summary: BPAP titration study  Clinical Lists Changes Sub-optimal titration.  Titrated up to 25/20 cm H2O with AHI still significantly elevated.  Used 3 liters oxygen with BPAP and still had oxygen desaturation both with and without apneic respiratory events.  Will start patient on auto-BPAP range 5 to 25 cm H2O with 5 liters oxygen.  Will get BPAP download and ONO on these settings.  If unable to adequately control sleep disordered breathing (OSA, OHS) with this set up will then need to consider either adaptive servo-ventilator or tracheostomy and nocturnal ventilation.  Will have my nurse call to inform patient that we will set up autoBPAP and oxygen at 5 liters at night.  Will need to schedule next available ROV to discuss further. Orders: Added new Referral order of DME Referral (DME) - Signed  Appended Document: BPAP titration study Pt is aware and is already sch for f/u on 12/2 @ 1:45 w/ VS.

## 2010-12-16 NOTE — Progress Notes (Signed)
Summary: notice to return to work  Phone Note From Other Clinic   Caller: case Production designer, theatre/television/film kim- offifce [phone] . fax [phone] Request: Talk with Nurse Details of Complaint: pt had cath on monday, when will pt return to work.  Initial call taken by: Lorne Skeens,  June 25, 2010 2:00 PM  Follow-up for Phone Call        will send to Dr Gala Romney for reveiw Meredith Staggers, RN  June 25, 2010 5:24 PM   Additional Follow-up for Phone Call Additional follow up Details #1::        Patient with severe pulmonary hypertension. Would check with Dr. Marchelle Gearing regarding work capacity. From cardiac cath point of view, OK to return to work. Dolores Patty, MD, [facility]  June 26, 2010 5:10 PM  office is closed will c/b later Meredith Staggers, RN  June 26, 2010 5:54 PM     Additional Follow-up for Phone Call Additional follow up Details #2::    Selena Batten is aware and states pt has appt w/Ramaswamy tom and will discuss then Meredith Staggers, RN  June 30, 2010 2:13 PM

## 2010-12-16 NOTE — Cardiovascular Report (Signed)
Summary: Cath/Percutaneous Orders   Cath/Percutaneous Orders   Imported By: Roderic Ovens 06/12/2010 16:08:59  _____________________________________________________________________  External Attachment:    Type:   Image     Comment:   External Document

## 2010-12-16 NOTE — Progress Notes (Signed)
Summary: spironolactone  Phone Note Other Incoming   Caller: Kim case Product manager of Call: she states pt was given a prescription for Spironolactone 12.5mg  once daily after her cath today but she [facility], [facility] Initial call taken by: Meredith Staggers, RN,  June 23, 2010 2:46 PM    New/Updated Medications: SPIRONOLACTONE 25 MG TABS (SPIRONOLACTONE) 1/2 tab daily Prescriptions: SPIRONOLACTONE 25 MG TABS (SPIRONOLACTONE) 1/2 tab daily  #30 x 6   Entered by:   Meredith Staggers, RN   Authorized by:   Dolores Patty, MD, [facility]   Signed by:   Meredith Staggers, RN on 06/23/2010   Method used:   Faxed to ...       [facility] [employer] (mail-order)       [address] [place], [place]  [address]       Ph: [phone]       Fax: [phone]   RxID:   [id number]

## 2010-12-16 NOTE — Progress Notes (Signed)
Summary: sch cath  Phone Note Outgoing Call   Call placed by: Meredith Staggers, RN,  May 30, 2010 11:59 AM Call placed to: Patient Summary of Call: received mess from [facility] in pulm that pt needs right heart cath by Dr Gala Romney, have sch cath on 8/8 at 9am, have called pt and left mess to call back   Follow-up for Phone Call        Left message to call back Meredith Staggers, RN  June 02, 2010 3:59 PM   pt aware of instructins, she states she has an appt w/Dr Craige Cotta that day will contact his office and have them resch her appt Meredith Staggers, RN  June 04, 2010 4:08 PM      Appended Document: order    Clinical Lists Changes  Orders: Added new Referral order of Cardiac Catheterization (Cardiac Cath) - Signed

## 2010-12-16 NOTE — Progress Notes (Signed)
Summary: Is paperwork done?  Phone Note From Other Clinic   Caller: caseworker--kim--[phone] Call For: Jeb Schloemer Summary of Call: Patient saw MR Tues. and gave Victorino Dike disability papers, which Victorino Dike said she would get papers to [employer].  Kim spoke with [employer] to check on status of disability papers, but [employer] told her that they never received papers.  Do we know where paperwork is? Initial call taken by: Lehman Prom,  July 04, 2010 12:58 PM  Follow-up for Phone Call        I have the paperwork, it was sent back up from[employer]. This are not disability forms, it is to help DSS evaluate her for food stamps and other programs. Pt case worker states that Dr. Philipp Deputy will not complete forms and pt does not have a PMD at this time. Please advise, I have forms at my desk. Carron Curie CMA  July 04, 2010 1:27 PM   Additional Follow-up for Phone Call Additional follow up Details #1::        ok I will fill it. Patient needs to get PMD. Can I fill it next week ? If not, I will try when I have time this week Additional Follow-up by: Kalman Shan MD,  July 07, 2010 4:01 PM    Additional Follow-up for Phone Call Additional follow up Details #2::    Spoke with pt.  She states that she finally est with PCP last wk- Dr. Arvilla Market.  She states that since MR has the forms  already she would prefer that he fill these out for her.  She states that she would like for forms to be done asap since she has very limited amount of money to buy food and other neccesities.  Pls advise when done, thanks!! Follow-up by: Vernie Murders,  July 07, 2010 4:10 PM  Additional Follow-up for Phone Call Additional follow up Details #3:: Details for Additional Follow-up Action Taken: [facility] called on behalf of pt to find out if papers were done.  Can you please chjeck on this?  [phone] Selena Batten - please call and let her know.  MW, please advise on the status of pt's  paperwork. Arman Filter LPN  July 10, 2010 10:42 AM  Additional Follow-up by: Eugene Gavia,  July 10, 2010 10:38 AM   Appended Document: Is paperwork done? I am so busy in ICU. I have not had time to come over this week. I I will attend to it tonight/tomorrow.   Appended Document: Is paperwork done? [email or web address] for kim  Appended Document: Is paperwork done? Called, spoke with Selena Batten.  Informed her of above per MR.  She verbalized understanding and requesting for [place] to call her when paperwork completed so she can pick it up d/t pt has no transportation.    Appended Document: Is paperwork done? paperwork completed and Selena Batten has been notified. I placed original in envelope at front and made a copy and placed in MR scan folder.   Appended Document: Is paperwork done? done, given to [facility]

## 2010-12-16 NOTE — Miscellaneous (Signed)
Summary: [facility] [facility]  [place] [facility]   Imported By: Florinda Marker 07/29/2010 15:51:00  _____________________________________________________________________  External Attachment:    Type:   Image     Comment:   External Document

## 2010-12-16 NOTE — Progress Notes (Signed)
  Phone Note Other Incoming   Caller: Leslie Gates Summary of Call: Given [place] auth# for Sleep Apnea.  Leslie Gates is not [facility] pt.  Informed office this is a one time [place].  [phone]. Initial call taken by: Abundio Miu,  October 15, 2010 4:53 PM

## 2010-12-16 NOTE — Progress Notes (Signed)
Summary: speak to nurse  Phone Note Other Incoming Call back at [phone]   Caller: lisa//[facility] Summary of Call: Wants to speak with nurse in ref to pt's cpap machine. Initial call taken by: Darletta Moll,  October 14, 2010 2:42 PM  Follow-up for Phone Call        Spoke with lisa and she states the pt is calling them asking about her new machine and settings but they did no t have an order for this. I looked up order and it was sent to [facility]. [facility] was wanting to know why it was sent to [facility]? Also can we cancel the order at [facility] and send the order to [facility]?  I spoke to rhond awho processed th eorder and she states she will call Misty Stanley at [facility].Jennifer Yancey Flemings CMA  October 14, 2010 3:21 PM   Additional Follow-up for Phone Call Additional follow up Details #1::        When order came through, tried calling pt with no answer and no return call. Saw that she had O2 with [facility]. Order sent to [facility] b/c that is who supply's her with the oxygen. Most of the time, we keep o2 and dme equipment with the same company. Called and explained this to lisa with sleep med and she is stating that pt wants them to set her up with this equipment. Called pt again, and went straight to voice mail. LMOAM for pt to call me b/c she is due to be set up with [facility] tomorrow. Rhonda Cobb  October 14, 2010 4:00 PM  Pt returned my call and requested order faxed to [facility] for BiPap. Pt stated that sleep med will come to her and [facility] makes her come in to office. Order faxed to [facility] at [facility] to supply Auto BiPAP 5-25 cm  h20 with h/h and mask of choice. Pt needs to use 5 lpm of oxygen with BiPAP, BiPAP download sent after 2 wks of use. Hanve ono done on auto Bipap with 5 lpm of o2. Alfonso Ramus  October 14, 2010 5:16 PM

## 2010-12-16 NOTE — Progress Notes (Signed)
  DDS request recieved sent to [employer]. [facility] [place]  June 17, 2010 8:09 AM

## 2010-12-16 NOTE — Progress Notes (Signed)
Summary: Need new prescripton for Ventolin HFA  Phone Note From Pharmacy   Caller: [facility] Details for Reason: Need new prescription Summary of Call: Received a phone call from [facility] pharmacy requesting a new prescription for Ventolin HFA inhaler.  This medication is not listed on patient's EMR chart.  They received a transfer from [facility]  with no refills remaining therefore requiring a new script.  [facility] said this was written a while ago.  Please call or fax approval to [phone] Initial call taken by: Paulo Fruit  BS,CPht II,MPH,  June 20, 2010 12:08 PM    New/Updated Medications: PROAIR HFA 108 (90 BASE) MCG/ACT AERS (ALBUTEROL SULFATE) one puff every 6 hours Prescriptions: PROAIR HFA 108 (90 BASE) MCG/ACT AERS (ALBUTEROL SULFATE) one puff every 6 hours  #30 days x 6   Entered and Authorized by:   Johny Sax MD   Signed by:   Johny Sax MD on 06/20/2010   Method used:   Print then Give to Patient   RxID:   [id number]   Appended Document: Need new prescripton for Ventolin HFA spoke to [place] who read back the order at [facility]

## 2010-12-16 NOTE — Miscellaneous (Signed)
Summary: Bridge Counselor  Clinical Lists Changes BC made a home visit this date to speak with pt regarding [facility] services. BC also reminded pt of her appointment with Dr. Philipp Deputy on the 14th at 3:30pm. Pt has arranged medicaid transportation for this appointment. BC has an intake scheduled with pt on 06/03/10 at 11am to enroll pt into [facility] program and to discuss Tx adherence.  Sharol Roussel  May 27, 2010 12:59 PM

## 2010-12-16 NOTE — Progress Notes (Signed)
Summary: Refill/gh  Phone Note Refill Request Message from:  Fax from Pharmacy  Refills Requested: Medication #1:  LISINOPRIL 10 MG TABS Take 1 tablet by mouth once a day  Medication #2:  COREG 3.125 MG TABS Take 1 tablet by mouth two times a day  Medication #3:  CLARITIN 10 MG TABS Take 1 tablet by mouth once a day  Medication #4:  MULTIVITAMINS   TABS Take 1 tablet by mouth once a day Pt [facility]new [facility]   Method Requested: Electronic Initial call taken by: Angelina Ok RN,  June 03, 2010 3:31 PM  Follow-up for Phone Call        Rx faxed to pharmacy.  Can you please confirm that this fax was successful?  Thank you! Follow-up by: Nelda Bucks DO,  June 04, 2010 7:11 AM    Prescriptions: MULTIVITAMINS   TABS (MULTIPLE VITAMIN) Take 1 tablet by mouth once a day  #30 x 12   Entered and Authorized by:   Nelda Bucks DO   Signed by:   Nelda Bucks DO on 06/04/2010   Method used:   Faxed to ...       [facility] [employer] (mail-order)       [address] [place], [place]  [address]       Ph: [phone]       Fax: [phone]   RxID:   [id number] LISINOPRIL 10 MG TABS (LISINOPRIL) Take 1 tablet by mouth once a day  #60 x 6   Entered and Authorized by:   Nelda Bucks DO   Signed by:   Nelda Bucks DO on 06/04/2010   Method used:   Faxed to ...       [facility] [employer] (mail-order)       [address] [place], [place]  [address]       Ph: [phone]       Fax: [phone]   RxID:   [id number] COREG 3.125 MG TABS (CARVEDILOL) Take 1 tablet by mouth two times a day  #60 x 6   Entered and Authorized by:   Nelda Bucks DO   Signed by:   Nelda Bucks DO on 06/04/2010   Method used:   Faxed to ...       [facility] [employer] (mail-order)       [address] [place], [place]  [address]       Ph: [phone]       Fax: [phone]   RxID:   [id number] CLARITIN 10 MG TABS (LORATADINE) Take 1 tablet by mouth once a  day  #30 x 12   Entered and Authorized by:   Nelda Bucks DO   Signed by:   Nelda Bucks DO on 06/04/2010   Method used:   Faxed to ...       [facility] [employer] (mail-order)       [address] [place], [place]  [address]       Ph: [phone]       Fax: [phone]   RxID:   [id number]

## 2010-12-16 NOTE — Consult Note (Signed)
Summary: [facility]   Imported By: Florinda Marker 09/08/2010 11:16:40  _____________________________________________________________________  External Attachment:    Type:   Image     Comment:   External Document

## 2010-12-16 NOTE — Procedures (Signed)
Summary: Oximetry / [facility]  [facility]xim[facility] / [facility]   Imported By: Lennie Odor 08/11/2010 12:01:02  _____________________________________________________________________  External Attachment:    Type:   Image     Comment:   External Document

## 2010-12-16 NOTE — Initial Assessments (Signed)
Summary: Hospital Admission  INTERNAL MEDICINE ADMISSION HISTORY AND PHYSICAL  ***Please place in Progress Notes section of chart***  First Contact: Dr. Arvilla Market [phone]) Second Contact: Dr. Comer Locket [phone])  Weekdays, Holidays, or after 5pm weekdays  First Contact: [phone] Second Contact:[phone]   PCP: Dr. Ninetta Lights  CC: SOB  HPI: Ms Kernen is a 45 y/o F with PMH of HIV (last CD4 330, 04/2010), HTN, and HLD who presents from ID clinic with c/c of SOB, LE swelling, and nasal congestion x 2 weeks and hypoxia noted during her OP ID visit.  She reports a 2 weeks hx of progressive SOB and bilateral foot swelling.  She noted her left foot became swollen after she was bitten by a mosquito, however a few days later her right foot began to swell.  She denies cough, fevers, chills, sweats, sick contacts, NVD, dizziness, and syncope.  She reports right-sided chest pain that occurs intermittently when she walks and is alleviated with rest. She reports a decreased ability to walk for more than 5 min before needed to stop to "catch her breath."   SHe also notes occasional wheezing, especially when the temp is very high outside.    She has no other complaints.  States she has not taken her HAART in approx 1.5 yrs, 2/2 inability to afford meds and increased difficulty in getting her meds once the pills were not longer delivered to her home.  She also has not taken any of her other medicines 2/2 cost.    ALLERGIES: NKDA  PAST MEDICAL HISTORY:  Hypertension HIV; last CD4 count 330 (04/24/10) Sleep apnea on CPAP Asthma Dyslipidemia Chronic sinusitis   MEDICATIONS: pt has not taken HAART for >1.5 yr, no other meds for 51mo NORVASC 10 MG TABS (AMLODIPINE BESYLATE) Take 1 tablet by mouth once a day HYDROXYZINE HCL 25 MG  TABS (HYDROXYZINE HCL) Take 1 tablet by mouth four times a day as needed FIORICET 50-325-40 MG  TABS (BUTALBITAL-APAP-CAFFEINE) as needed headahce FAMOTIDINE 20 MG  TABS (FAMOTIDINE)  Take 1 tablet by mouth once a day CLARITIN-D 12 HOUR 5-120 MG  TB12 (LORATADINE-PSEUDOEPHEDRINE) Take 1 tablet by mouth two times a day NAPHCON-A 0.025-0.3 %  SOLN (NAPHAZOLINE-PHENIRAMINE) 2 drops each eye two times a day TRUVADA 200-300 MG  TABS (EMTRICITABINE-TENOFOVIR) Take 1 tablet by mouth once a day NASACORT AQ 55 MCG/ACT  AERS (TRIAMCINOLONE ACETONIDE(NASAL)) one spray two times a day PROAIR HFA 108 (90 BASE) MCG/ACT AERS (ALBUTEROL SULFATE) one puff every 6 hours REYATAZ 300 MG CAPS (ATAZANAVIR SULFATE) Take 1 tablet by mouth once a day * NORVIR 100 MG TABLETS (RITONAVIR) Take 1 tablet by mouth once a day   SOCIAL HISTORY: Pt lives with her 2 children: daughter age 82, son age 47, and with grandchild age 42 Has a boyfriend x 2 yrs, also HIV positive Recently started working in the Surveyor, 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[employer]) Never Smoked Alcohol use-no Drug use-no   FAMILY HISTORY HTN, EtOH-ism, colon ca Mother and father deceased 2/2 complications from cirrhosis   ROS: as per HPI, all other systems reviewed and negative   [facility]IT[facility]:  [facility]  Height:      63 inches (160.02 cm) Weight:      192.0 pounds (87.27 kg) BMI:     34.13 O2 Sat:      87 % on Room air Temp:     98.4 degrees F (36.89 degrees C) oral Pulse rate:   81 / minute BP sitting:   170 /  126  (right arm)   In ED: T: 97.4 P: 80 BP: 180/120  R: 24 (12-16 on exam) O2SAT: 96 on 2L  PHYSICAL EXAM:  Gen: Patient is in NAD, Pleasant, Alert and oriented x 3. Eyes: PERRL, EOMI, No signs of anemia or jaundince. ENT: MMM, OP clear, No erythema, thrush or exudate. Neck: Supple, 8cm JVD + hepatojugular reflex, no bruits Resp: decreased BS througout, CTAB CVS: Clear S1S2 RRR, No M/R/G GI: Abdomen is soft. ND, NT, NG, NR, BS+. No organomegaly. Ext: Trace bilateral edema, cyanosis or clubbing. Lymph: No palpable lymphadenopathy. MS: Moving all 4 extremities. Neuro: A&O X3, CN II - XII are grossly intact. Motor strength  is 5/5 in the all 4 extremities, Sensations intact to light touch, Gait normal, Cerebellar signs negative. Psych: Appropriate  LABS: none av  June 9th   Tests: (1) CBC with Diff (10010)   WBC                  [L]  3.3 K/uL                    4.0-10.5   RBC                       5.10 MIL/uL                 3.87-5.11   Hemoglobin                14.6 g/dL                   16.1-09.6   Hematocrit                45.3 %                      36.0-46.0   MCV                       88.8 fL                     78.0-100.0 ! MCH                       28.6 pg                     26.0-34.0   MCHC                      32.2 g/dL                   04.5-40.9   RDW                       14.4 %                      11.5-15.5   Platelet Count            180 K/uL                    150-400   Granulocyte %        [L]  33 %                        43-77   Absolute Gran        [L]  1.1 K/uL  1.7-7.7   Lymph %              [H]  52 %                        12-46   Absolute Lymph            1.7 K/uL                    0.7-4.0   Mono %                    12 %                        3-12   Absolute Mono             0.4 K/uL                    0.1-1.0   Eos %                     3 %                         0-5   Absolute Eos              0.1 K/uL                    0.0-0.7   Baso %                    1 %                         0-1   Absolute Baso             0.0 K/uL                    0.0-0.1   WBC Morphology       RESULT: Criteria for review not met   RBC Morphology       RESULT: Criteria for review not met   Smear Review       RESULT: Criteria for review not met  Tests: (2) Comprehensive Metabolic Panel (57322)   Sodium                    138 mEq/L                   135-145   Potassium                 4.2 mEq/L                   3.5-5.3   Chloride                  98 mEq/L                    96-112   CO2                       31 mEq/L                    19-32   Glucose                    74 mg/dL  70-99   BUN                       13 mg/dL                    1-61   Creatinine                0.78 mg/dL                  0.40-1.20   Bilirubin, Total          0.4 mg/dL                   0.9-6.0   Alkaline Phosphatase      58 U/L                      39-117   AST/SGOT                  30 U/L                      0-37   ALT/SGPT                  18 U/L                      0-35   Total Protein        [H]  8.8 g/dL                    4.5-4.0   Albumin                   3.8 g/dL                    9.8-1.1   Calcium                   9.3 mg/dL                   9.1-47.8  Tests: (3) Lipid Profile (29562)   Cholesterol               133 mg/dL                   1-308     ATP III Classification:           < 200        mg/dL        Desirable          200 - 239     mg/dL        Borderline High          >= 240        mg/dL        High        Triglyceride         [H]  159 mg/dL                   <657  HDL Cholesterol            42 mg/dL                    >84  Total Chol/HDL Ratio       3.2 Ratio  VLDL Cholesterol (Calc)    32 mg/dL  0-40  LDL Cholesterol (Calc)     59 mg/dL                    4-54        Tests: (4) RPR Reflex to T.pallidum Ab, Total (09811)   RPR                       NON REAC                    NON REAC   ASSESSMENT AND PLAN:  (1) SOB with hypoxia The etiology of Ms. Oquin's SOB with hypoxia is unclear.  It is unlikely that her symptoms refleft PCP PNA, or other opportunistic infection, as her recent CD4 count is 330.  She does not have any known CAD, however her peripheral edema and JVD suggest CHF as a possibility.  Another possibility is PNA (likely CAP).  PTX and PE (much less likely based on pts exam and hx) are possible, but much less likely as the pt is not in respiratory distress and able to maintain adequate 02 sat with minimal supplementation.  Her symptoms are also not c/w acute asthma exacerbation. - Will admit to  telemetry - Check stat BNP, CXR - Will check 2D echo - Will not start empiric abx, as pt is not exhibiting clear signs of infection - Start Lasix at 40mg  by mouth  - Will provide albuterol prn  (2) HTN Pts BP was high on admission.  This is partially the result of non-adherence to her medication regimen as well as acute elevation 2/2 stress of hospital admission.  She is without signs/symptoms of hypertensive urgency/emergency. - Will start norvasc and lasxi - Continue to monitor closely  (3) Chronic sinusitis/ sinus congestion - Will start Claritin  (4)HIV, CD 4 of 330 - CD 4 recently checked, will not repeat lab - Pt has not been on HAART for >34yr.  Will defer re-starting therapy to out-pt ID f/u.   (5)VTE prophy - Lovenox        I performed and/or observed a history and physical examination of the patient.  I discussed the case with the residents as noted and reviewed the residents' notes.  I agree with the findings and plan--please refer to the attending physician note for more details.  Signature  Printed Name

## 2010-12-16 NOTE — Progress Notes (Signed)
Summary: FYI  Phone Note Other Incoming Call back at [phone]   Caller: Sigmund Hazel w/[facility] Summary of Call: trying to get in touch with pt to retrieve card but pt hasn't returned call.  Will continue to call, but will let you know Monday if she still cannot reach pt. Initial call taken by: Eugene Gavia,  July 04, 2010 10:29 AM  Follow-up for Phone Call        Spoke with patients daughter-pt is sleeping at this time and knows to get in touch with company about the card.Reynaldo Minium CMA  July 04, 2010 10:34 AM

## 2010-12-16 NOTE — Progress Notes (Signed)
Summary: nos appt  Phone Note Call from Patient   Caller: [email or web address]  Call For: sood Summary of Call: Rsc nos from 10/11 to 10/21. Initial call taken by: Darletta Moll,  August 27, 2010 2:55 PM

## 2010-12-16 NOTE — Miscellaneous (Signed)
Summary: Bridge Counselor  Clinical Lists Changes [facility] transported pt home today after her scheduled heart cath procedure. Pt was stable and discharged early. BC coordinated getting her Rx sent to [facility] from Dr. Prescott Gum office as well as her eye drops from Dr. Danella Deis office. Pt will see Dr. Craige Cotta on Friday August 12th at 3:30 and will then follow up with Dr. Gala Romney.  Sharol Roussel  June 24, 2010 10:56 AM

## 2010-12-16 NOTE — Letter (Signed)
Summary: [place] [employer]: CMN  [place] [facility] [employer]: CMN   Imported By: Florinda Marker 06/18/2010 10:24:59  _____________________________________________________________________  External Attachment:    Type:   Image     Comment:   External Document

## 2010-12-16 NOTE — Miscellaneous (Signed)
Summary: Bridge Counselor  Clinical Lists Changes Pt called BC today to discuss her appointment with Dr. Olivia Canter. BC scheduled medicaid transportation for ct to be at the hospital by 7am for her scheduled heart cath.  Sharol Roussel  June 17, 2010 3:22 PM

## 2010-12-16 NOTE — Letter (Signed)
Summary: [facility] Dept of Social Services  [facility] Dept of Social Services   Imported By: Lennie Odor 07/17/2010 10:21:24  _____________________________________________________________________  External Attachment:    Type:   Image     Comment:   External Document

## 2010-12-16 NOTE — Progress Notes (Signed)
Summary: download-  Phone Note Call from Patient Call back at [phone]   Caller: Misty Stanley @ [facility] Call For: Leslie Gates Reason for Call: Talk to Nurse Summary of Call: Do you want her to do a download on loaner machine when she picks it up?  Did we get download faxed over to [place] yesterday on pt? Initial call taken by: Eugene Gavia,  July 10, 2010 1:24 PM  Follow-up for Phone Call        Pls advise, thanks Vernie Murders  July 10, 2010 2:41 PM    Additional Follow-up for Phone Call Additional follow up Details #1::        I need download from machine she is currently using.  I have not seen any download yet. Additional Follow-up by: Coralyn Helling MD,  July 11, 2010 1:49 PM    Additional Follow-up for Phone Call Additional follow up Details #2::    attempted to call lisa at the sleep lab---no answer--will try back later-- Randell Loop [facility]  July 11, 2010 3:13 PM   ATC Misty Stanley with sleep med- [id number] Vernie Murders  July 14, 2010 11:33 AM Lawson Fiscal I am sending this to you if dr Leslie Gates needs a download since each nurse gets results for physicians   Philipp Deputy [facility]  July 14, 2010 11:37 AM   Misty Stanley will fax download results that VS does not have from the loaner machine.Michel Bickers [facility]  July 14, 2010 12:04 PM Additional download received and placed in Dr. Evlyn Courier folder for his review. Follow-up by: Michel Bickers CMA,  July 15, 2010 9:21 AM  Additional Follow-up for Phone Call Additional follow up Details #3:: Details for Additional Follow-up Action Taken: Have download and will d/w pt. Additional Follow-up by: Coralyn Helling MD,  July 23, 2010 9:03 AM

## 2010-12-16 NOTE — Letter (Signed)
Summary: CMN Orders / [facility]  [facility] Orders / [facility]   Imported By: Lennie Odor 10/21/2010 10:33:28  _____________________________________________________________________  External Attachment:    Type:   Image     Comment:   External Document

## 2010-12-16 NOTE — Miscellaneous (Signed)
Summary: Bridge Counselor  Clinical Lists Changes BC enrolled pt into the [facility] program yesterday. BC will help pt coordinate all of the referral appointments that were made upon her hospital discharge on 6.29.11.  Sharol Roussel  June 04, 2010 2:42 PM

## 2010-12-16 NOTE — Miscellaneous (Signed)
Summary: Bridge Counselor  Clinical Lists Changes Pt's file for bridge counseling was closed today.

## 2010-12-16 NOTE — Miscellaneous (Signed)
Summary: Bridge Counselor  Clinical Lists Changes [facility] transported pt to [facility] today for her scheduled appointment with Dr. Philipp Deputy. Pt's HIV regimen was changed today and BC provided treatment adherence with pt regarding how and when to take the new medication. Pt expressed understanding and will call the clinic if she experiences any problems.

## 2010-12-16 NOTE — Miscellaneous (Signed)
Summary: Bridge Counselor  Clinical Lists Changes BC went to see pt while she was in the hospital. Pt reports being non compliant with appointments and medications. Pt knows that she needs to do better and reports that her health has declined recently. Pt has agreed to [facility] services and will call [facility] next week after discharge to schedule an intake with [facility].  Sharol Roussel  May 12, 2010 11:56 AM

## 2010-12-16 NOTE — Assessment & Plan Note (Signed)
Summary: NEW ID PT EST CARE WITH DR MILLS/[facility]   Vital Signs:  Patient profile:   45 year old female Height:      63 inches (160.02 cm) Weight:      197.9 pounds (89.95 kg) BMI:     35.18 O2 Sat:      95 % on 2 L/min Temp:     97.2 degrees F oral Pulse rate:   77 / minute BP sitting:   170 / 125  (right arm)  Vitals Entered By: Chinita Pester RN (July 04, 2010 1:31 PM)  O2 Flow:  2 L/min CC: New clinic/doctor - to establish care. Pt. stated she did not take HTN med. today. Is Patient Diabetic? No Pain Assessment Patient in pain? no      Nutritional Status BMI of > 30 = obese  Have you ever been in a relationship where you felt threatened, hurt or afraid?Unable to ask; someone w/pt.   Does patient need assistance? Functional Status Self care Ambulation Normal   Primary Care Avryl Roehm:  [facility]/Vollmer - PMD., Dr Marchelle Gearing - Pulmonary, Dr. Craige Cotta - sleep, Dr. Teressa Lower - Cards  CC:  New clinic/doctor - to establish care. Pt. stated she did not take HTN med. today.Marland Kitchen  History of Present Illness: Pt is a 45 y/o F with PMH outlined in the EMR who presents today to establish care with a PCP.  1. HTN: pts BP is elevated today. States she did not take her am meds today; she had a very lat night last night, woke up late and didn't take her meds before leaving the house.   2. Pulmonary HTN/OSA: pt is following with pulm. Now on continuous O2 and recently started spironolactone with improvement of signs.  SHe is also following with [facility] for management of her right heart failure.  Is currently monitoring pulse ox at night while using CPAP in an effort to titrate CpAP; will f/u with PCCM.  Pt will hopefully be starting pulmonary rehab.  3. HIV dz: follow with Dr[facility]ollmer at [facility]; recently resumed ART 05/2010.  Now on Norvir, Truvada and Reyataz. Last CD4 in 04/2010 was 330 with VL of [id number].  Plan is to recheck labs lat Aug/early sept; has appt on 8/28.  She is taking her meds  regularly  No headaches, c/p, worsening sob,fever, chills, or other complaint.     Depression History:      The patient denies a depressed mood most of the day and a diminished interest in her usual daily activities.  The patient denies significant weight loss, significant weight gain, insomnia, and hypersomnia.  The patient denies symptoms of a manic disorder including persistently & abnormally elevated mood and abnormally & persistently irritable mood.         Preventive Screening-Counseling & Management  Alcohol-Tobacco     Alcohol drinks/day: 0     Smoking Status: never  Caffeine-Diet-Exercise     Caffeine use/day: soda     Does Patient Exercise: no  Current Medications (verified): 1)  Claritin 10 Mg Tabs (Loratadine) .... Take 1 Tablet By Mouth Once A Day 2)  Nasacort Aq 55 Mcg/act  Aers (Triamcinolone Acetonide(Nasal)) .... One Spray Two Times A Day 3)  Proair Hfa 108 (90 Base) Mcg/act Aers (Albuterol Sulfate) .... One Puff Every 6 Hours 4)  Truvada 200-300 Mg  Tabs (Emtricitabine-Tenofovir) .... Take 1 Tablet By Mouth Once A Day 5)  Reyataz 300 Mg Caps (Atazanavir Sulfate) .... Take 1 Tablet By Mouth Once A Day  6)  Norvir 100 Mg Tabs (Ritonavir) .... Take 1 Tablet By Mouth Once A Day 7)  Coreg 3.125 Mg Tabs (Carvedilol) .... Take 1 Tablet By Mouth Two Times A Day 8)  Spironolactone 25 Mg Tabs (Spironolactone) .... 1/2 Tab Daily 9)  Lisinopril 10 Mg Tabs (Lisinopril) .... Take 1 Tablet By Mouth Once A Day 10)  Naphcon-A 0.025-0.3 %  Soln (Naphazoline-Pheniramine) .... 2 Drops Each Eye Two Times A Day 11)  Apap 325 Mg Tabs (Acetaminophen) .... Take 1 Tablet By Mouth Every 4 Hours As Needed 12)  Multivitamins   Tabs (Multiple Vitamin) .... Take 1 Tablet By Mouth Once A Day  Allergies (verified): No Known Drug Allergies  Past History:  Past medical, surgical, family and social histories (including risk factors) reviewed for relevance to current acute and chronic  problems.  Past Medical History: Reviewed history from 06/27/2010 and no changes required.  1. Pulmonary hypertension.       a.     On May 09, 2010, 2-D echocardiogram:  Ejection fraction 60%        to 65%, no regional wall motion abnormalities.  Grade 2 diastolic        dysfunction.  Moderately dilated right ventricle with mild-to-        moderately reduced function.  Pulmonary artery systolic pressure        37 mmHg.       b.     Shortness of breath and hypoxia on home oxygen at 2 L per        minute.       c.    Right heart catheterization June 23, 2010: RA pressure 21-25, RV 78/39 with EDP 21, PA pressure 84/44 with mean 60, PCWP 25-30, Fick CO 4.2 L/min, CI 2.3 L/min/m2, PVR 7.1 Woods unit. Oxygen saturations>>PA 65%, Femoral artery 95%, SVC 70%.     d. Spirometry June 2011: FEV1 48%, FVC 48%, FEV1% 98     e. ABG on room air May 13, 2010: pH 7.42, PaCO2 56, PaO2 59    2. Severe obstructive sleep apnea.       a.     PSG 06/08/04 RDI 92.1, oxygen nadir 57%  3. Chronic diastolic congestive heart failure.   4. Hypertension.   5. Human immunodeficiency virus diagnosed approximately 2001.   6. Noncompliance.   7. History of bilateral wrist fractures status post surgical repair in       2007.   8. History of herpes zoster.   9. History of cervical dysplasia.   10.Status post adenoidectomy.   11.Dysphagia.   12. Chronic otitis media  13. Allergic rhinitis  14. Cervical dysplasia  Past Surgical History: Reviewed history from 06/27/2010 and no changes required. Ear tubes-as a child C-section Sinus surgery 2004 [place] Bilateral wrist repair-2006 [place] Adenoidectomy  Family History: Reviewed history from 06/27/2010 and no changes required. Mother - age 72 ESRD Father - GSW Several family members with hypertension  Social History: Reviewed history from 07/01/2010 and no changes required. Alcohol use-no Drug use-no Marital Status: single Children: 2 Occupation:  unemployed  Patient never smoked.   Physical Exam  General:  on supplemental oxygen.  alert, well-developed, well-nourished, and well-hydrated.   Head:  normocephalic and atraumatic.   Eyes:  PERRLA. EOMI; conjunctiva and sclera clear Mouth:  pharynx pink and moist, no erythema, and no exudates.   Lungs:  normal respiratory effort, no accessory muscle use, no crackles, and no wheezes.  Decreased breath sounds bilaterally with  fair air movement. Heart:  normal rate, regular rhythm, no murmur, no gallop, and no JVD.   Abdomen:  soft, non-tender, and normal bowel sounds.   Extremities:  No edema Neurologic:  alert & oriented X3, cranial nerves II-XII intact, strength normal in all extremities, and gait normal.   Skin:  turgor normal and color normal.   Psych:  Oriented X3, memory intact for recent and remote, normally interactive, good eye contact, not anxious appearing, and not depressed appearing.     Impression & Recommendations:  Problem # 1:  HYPERTENSION (ICD-401.9) BP elevated today; I believe this is the result of pt missing am meds. Review of the EMR reveals excellent control of BP at previous appts with ID and pulm (SBPs 120-140).  WIll continue her current regimen and recheck in 1 month.  Reviewed importance of medication compliance to prevent dangerous and debilitating sequelae of uncontrolled HTN; pt voiced understanding and plan to take meds regularly. Will check a CMET today as pt was recently started on spironolactone and has upcoming appt with ID.   Her updated medication list for this problem includes:    Coreg 3.125 Mg Tabs (Carvedilol) .Marland Kitchen... Take 1 tablet by mouth two times a day    Spironolactone 25 Mg Tabs (Spironolactone) .Marland Kitchen... 1/2 tab daily    Lisinopril 10 Mg Tabs (Lisinopril) .Marland Kitchen... Take 1 tablet by mouth once a day  Orders: T-Comprehensive Metabolic Panel ([id number]) T-CBC w/Diff ([id number])  Problem # 2:  HYPERTENSION, PULMONARY (ICD-416.8) Reviewed  notes from pulmonary.  Greatly appreciate their input and assistance with this pt.  WIll continue to defer managment to them.  I wonder if pt would benefit from anticoagulation with coumadin to prevent microemboli/PE; assuming she is able to demonstrate compliance with ART and CPAP.  Will continue to follow recs from pulm.  Problem # 3:  HIV DISEASE (ICD-042) Pt is tolerating ART well. Has appt with ID on 8/28 for repeat labs.  WIll obtain CMET and CBC today. Greatly appreciate ID assistance and help with this pt; will f/u note from next visit and continue to follow recs.  Orders: T-Comprehensive Metabolic Panel [phone] [id number]) T-CBC w/Diff ([id number])  Problem # 4:  Preventive Health Care (ICD-V70.0) Pt states she is UTD with annual pap smears.  Will obtain records.  She will continue with recommended vaccination schedule with ID.  Problem # 5:  MORBID OBESITY (ICD-278.01) Pt referred to nutritionist at her last appt with pulm.  Invited her to join the [facility] monthly extreme makeover classes for further help and support with weight loss and healthy lifestyle modifications.  Complete Medication List: 1)  Claritin 10 Mg Tabs (Loratadine) .... Take 1 tablet by mouth once a day 2)  Nasacort Aq 55 Mcg/act Aers (Triamcinolone acetonide(nasal)) .... One spray two times a day 3)  Proair Hfa 108 (90 Base) Mcg/act Aers (Albuterol sulfate) .... One puff every 6 hours 4)  Truvada 200-300 Mg Tabs (Emtricitabine-tenofovir) .... Take 1 tablet by mouth once a day 5)  Reyataz 300 Mg Caps (Atazanavir sulfate) .... Take 1 tablet by mouth once a day 6)  Norvir 100 Mg Tabs (Ritonavir) .... Take 1 tablet by mouth once a day 7)  Coreg 3.125 Mg Tabs (Carvedilol) .... Take 1 tablet by mouth two times a day 8)  Spironolactone 25 Mg Tabs (Spironolactone) .... 1/2 tab daily 9)  Lisinopril 10 Mg Tabs (Lisinopril) .... Take 1 tablet by mouth once a day 10)  Naphcon-a 0.025-0.3 % Soln (Naphazoline-pheniramine) .... 2 drops  each eye two times a day 11)  Apap 325 Mg Tabs (Acetaminophen) .... Take 1 tablet by mouth every 4 hours as needed 12)  Multivitamins Tabs (Multiple vitamin) .... Take 1 tablet by mouth once a day  Patient Instructions: 1)  Please schedule a follow-up appointment with Dr. Arvilla Market in late Sept. 2)  Check out one of our Extreme makeover clases.  They are in the clinic every 2nd Tues of each month at 11:00am-11. The next class in on Sept 13 2011. 3)  I will call you if any of your lab work is abnormal. 4)  Keep up the great work! 5)  Call the clinic at [phone] with any concerns or questions.  Prevention & Chronic Care Immunizations   Influenza vaccine: Historical  (05/14/2010)    Tetanus booster: Not documented    Pneumococcal vaccine: Pneumovax  (09/06/2007)  Other Screening   Pap smear: Normal  (03/14/2008)    Mammogram: ASSESSMENT: Negative - BI-RADS 1^MM DIGITAL SCREENING  (09/17/2008)  Reports requested:   Last Pap report requested.  Smoking status: never  (07/04/2010)  Lipids   Total Cholesterol: 133  (04/24/2010)   LDL: 59  (04/24/2010)   LDL Direct: Not documented   HDL: 42  (04/24/2010)   Triglycerides: 159  (04/24/2010)  Hypertension   Last Blood Pressure: 170 / 125  (07/04/2010)   Serum creatinine: 0.78  (04/24/2010)   Serum potassium 4.2  (04/24/2010) CMP ordered     Hypertension flowsheet reviewed?: Yes   Progress toward BP goal: Unchanged  Self-Management Support :   Personal Goals (by the next clinic visit) :      Personal blood pressure goal: 140/90  (07/04/2010)   Patient will work on the following items until the next clinic visit to reach self-care goals:     Medications and monitoring: take my medicines every day, bring all of my medications to every visit  (07/04/2010)     Eating: eat foods that are low in salt, eat baked foods instead of fried foods  (07/04/2010)    Hypertension self-management support: Education handout, Written self-care plan,  Resources for patients handout  (07/04/2010)   Hypertension self-care plan printed.   Hypertension education handout printed      Resource handout printed.   Nursing Instructions: Request report of last Pap (Dr. Clearance Coots)   Process Orders Check Orders Results:     [facility]: ABN not required for this insurance Tests Sent for requisitioning (July 04, 2010 3:15 PM):     07/04/2010: [facility] -- T-Comprehensive Metabolic Panel [[id number]] (signed)     07/04/2010: [facility] -- [facility] w/Diff [[id number]] (signed)     Appended Document: NEW ID PT EST CARE WITH DR MILLS/[facility] Received PAP report from Dr. Verdell Carmine office; to be scanned into pt.'s chart.

## 2010-12-16 NOTE — Progress Notes (Signed)
Summary: Records request   Records request received from the fax machine from [facility]. Forwarded to [employer] for processing. Leslie Gates  July 17, 2010 5:20 PM

## 2010-12-16 NOTE — Miscellaneous (Signed)
Summary: Bridge Counselor  Clinical Lists Changes BC coordinated ct's paperwork for DSS today. BC picked up the paperwork from Pulmonary and took them to DSS on ct's behalf.  Sharol Roussel  July 16, 2010 12:47 PM

## 2010-12-16 NOTE — Letter (Signed)
Summary: CMN/[facility]  CMN/[facility]   Imported By: Lester Chambers 10/14/2010 12:57:16  _____________________________________________________________________  External Attachment:    Type:   Image     Comment:   External Document

## 2010-12-16 NOTE — Progress Notes (Signed)
  Phone Note Other Incoming   Request: Send information Summary of Call: Request received from Disability Determination Services forwarded to [employer].       

## 2010-12-16 NOTE — Miscellaneous (Signed)
Summary: Overnight oximetry using autoCPAP and 2 liters oxygen  Clinical Lists Changes Test time 10hrs 57 min.  Mean SpO2 77%, Low 47%,  Spent 86% of test time with SpO2 < 88%.  Results d/w pt.  Have ordered in-lab BPAP titration study to be done using supplemental oxygen.

## 2010-12-16 NOTE — Miscellaneous (Signed)
Summary: Overnight oximetry on room air.  Clinical Lists Changes I had order ONO do be done on CPAP and supplemental oxygen (please see order from 06/27/10).  For some reason [facility] performed test on room air.  As a result pt had significant oxygen desaturation.  I will request that our coordinator reschedule ONO, but to ensure this time test is done with patient using CPAP and 2 liters oxygen. Orders: Added new Referral order of DME Referral (DME) - Signed

## 2010-12-16 NOTE — Progress Notes (Signed)
Summary: disability papers  Phone Note From Other Clinic   Caller: KIM POFF/ CASEWORKER FOR PT Call For: [facility] Summary of Call: caller is inquiring about disability papers that were sent to [employer] tues. are they ready? kim poff [phone] Initial call taken by: Tivis Ringer, CNA,  July 04, 2010 12:20 PM  Follow-up for Phone Call        [employer] is taking care of this; flag sent to [facility].Reynaldo Minium CMA  July 04, 2010 12:41 PM

## 2010-12-16 NOTE — Assessment & Plan Note (Signed)
Summary: hsfu   Referring Provider:  Hospital Primary Provider:  [facility]/Takira Sherrin  CC:  Hosp. follow up, pt. has been off HIV meds since hosp., and pt. has O2 at home.  History of Present Illness: Pt forgot her oxygen at home. She does not feel SOB at this time. She saw her Pulmonary MD yesterday. She is here to get started back on meds.    Preventive Screening-Counseling & Management  Alcohol-Tobacco     Alcohol drinks/day: 0     Smoking Status: never  Caffeine-Diet-Exercise     Caffeine use/day: soda     Does Patient Exercise: no  Safety-Violence-Falls     Seat Belt Use: yes      Drug Use:  no.     Updated Prior Medication List: CLARITIN 10 MG TABS (LORATADINE) Take 1 tablet by mouth once a day NAPHCON-A 0.025-0.3 %  SOLN (NAPHAZOLINE-PHENIRAMINE) 2 drops each eye two times a day TRUVADA 200-300 MG  TABS (EMTRICITABINE-TENOFOVIR) Take 1 tablet by mouth once a day NASACORT AQ 55 MCG/ACT  AERS (TRIAMCINOLONE ACETONIDE(NASAL)) one spray two times a day PROAIR HFA 108 (90 BASE) MCG/ACT AERS (ALBUTEROL SULFATE) one puff every 6 hours REYATAZ 300 MG CAPS (ATAZANAVIR SULFATE) Take 1 tablet by mouth once a day APAP 325 MG TABS (ACETAMINOPHEN) Take 1 tablet by mouth every 4 hours as needed COREG 3.125 MG TABS (CARVEDILOL) Take 1 tablet by mouth two times a day LISINOPRIL 10 MG TABS (LISINOPRIL) Take 1 tablet by mouth once a day MULTIVITAMINS   TABS (MULTIPLE VITAMIN) Take 1 tablet by mouth once a day NORVIR 100 MG TABS (RITONAVIR) Take 1 tablet by mouth once a day  Current Allergies (reviewed today): No known allergies  Past History:  Past Medical History: Last updated: 05/28/2010 SHORTNESS OF BREATH (ICD-786.05) ACUTE SINUSITIS, UNSPECIFIED (ICD-461.9) DYSPNEA ON EXERTION (ICD-786.09) ALLERGIC RHINITIS CAUSE UNSPECIFIED (ICD-477.9) DYSPLASIA OF CERVIX UNSPECIFIED (ICD-622.10) HERPES ZOSTER (ICD-053.9) OTITIS MEDIA, CHRONIC (ICD-382.9) FRACTURE, WRIST, RIGHT  (ICD-814.00) FRACTURE, WRIST, LEFT (ICD-814.00) HYPERTENSION (ICD-401.9) HIV DISEASE (ICD-042)    Review of Systems  The patient denies anorexia, fever, weight loss, chest pain, and syncope.    Vital Signs:  Patient profile:   45 year old female Height:      63 inches (160.02 cm) Weight:      193.0 pounds (87.73 kg) BMI:     34.31 O2 Sat:      85 % on Room air Temp:     98.1 degrees F (36.72 degrees C) oral Pulse rate:   81 / minute BP sitting:   125 / 84  (right arm)  Vitals Entered By: Wendall Mola CMA Duncan Dull) (May 29, 2010 2:57 PM)  O2 Flow:  Room air CC: Hosp. follow up, pt. has been off HIV meds since hosp., pt. has O2 at home Is Patient Diabetic? No Pain Assessment Patient in pain? no      Nutritional Status BMI of > 30 = obese Nutritional Status Detail appetite "good"  Does patient need assistance? Functional Status Self care Ambulation Normal Comments pt. said she was not given HIV meds in the hospital   Physical Exam  General:  alert, well-developed, well-nourished, and well-hydrated.   Head:  normocephalic and atraumatic.   Mouth:  pharynx pink and moist.   Lungs:  normal breath sounds and no wheezes.          Medication Adherence: 05/29/2010   Adherence to medications reviewed with patient. Counseling to provide adequate adherence provided  Impression & Recommendations:  Problem # 1:  HIV DISEASE (ICD-042) Discussed treatment options.  Pt would like to go back on previous regimen of Reyataz, norvir and Truvada. I discussed the need for her to take her meds every day.  Pt will return in 6 weeks for repeat labs.  Diagnostics Reviewed:  CD4: 330 (04/25/2010)   WBC: 3.3 (04/24/2010)   Hgb: 14.6 (04/24/2010)   HCT: 45.3 (04/24/2010)   Platelets: 180 (04/24/2010) HIV genotype: REPORT (12/21/2007)   HIV-1 RNA: 40400 (04/24/2010)   HBSAg: No (01/10/2007)  Problem # 2:  HYPERTENSION, PULMONARY  (ICD-416.8) Pt seeing pulmonary. pt encouraged to use her O2 and she was placed on O2 here.  Problem # 3:  SLEEP APNEA (ICD-780.57) awaiting CPAP.  Medications Added to Medication List This Visit: 1)  Norvir 100 Mg Tabs (Ritonavir) .... Take 1 tablet by mouth once a day  Other Orders: Est. Patient Level III (04540) Future Orders: T-CD4SP ([facility]) (CD4SP) ... 07/10/2010 T-HIV Viral Load [phone]) ... 07/10/2010 T-Comprehensive Metabolic Panel [phone]) ... 07/10/2010 T-CBC w/Diff ([id number]) ... 07/10/2010  Patient Instructions: 1)  Please schedule a follow-up appointment in 8 weeks, 2 weeks after labs.  Prescriptions: TRUVADA 200-300 MG  TABS (EMTRICITABINE-TENOFOVIR) Take 1 tablet by mouth once a day  #30 x 5   Entered and Authorized by:   Yisroel Ramming MD   Signed by:   Yisroel Ramming MD on 05/29/2010   Method used:   Print then Give to Patient   RxID:   [id number] REYATAZ 300 MG CAPS (ATAZANAVIR SULFATE) Take 1 tablet by mouth once a day  #30 x 5   Entered and Authorized by:   Yisroel Ramming MD   Signed by:   Yisroel Ramming MD on 05/29/2010   Method used:   Print then Give to Patient   RxID:   [id number] NORVIR 100 MG TABS (RITONAVIR) Take 1 tablet by mouth once a day  #30 x 5   Entered and Authorized by:   Yisroel Ramming MD   Signed by:   Yisroel Ramming MD on 05/29/2010   Method used:   Print then Give to Patient   RxID:   [id number]  RXs called to [facility] Wendall Mola CMA ( AAMA)  May 29, 2010 4:20 PM

## 2010-12-16 NOTE — Assessment & Plan Note (Signed)
Summary: 3 months/ mbw   Visit Type:  Follow-up Copy to:  Dr. Marchelle Gearing, Dr. Jones Broom Primary Provider/Referring Provider:  Hatcher/Vollmer - PMD., Dr Marchelle Gearing - Pulmonary, Dr. Craige Cotta - sleep, Dr. Teressa Lower - Cards  CC:  Leslie Gates here for follow-up. Leslie Gates states increased SOB x 2 months..  History of Present Illness: Obese BMI 33 (40# weighg gain in 2011), AA female, with HIV since 2000 (compliant since summer 2011 following case manager Kim Puff involvement), OSA since 2003 with CPAP noncompliance. Has Cass 3  dyspnea with desaturation at 185 feet of walkingon room air in July 2011. ECHO June 2011 with Grade 2 diast dysfunction. PFTs Spirometry June 2011 suggestive of restriction, CT Chest June 201 without evidence of PE or parencymal disease. ABG on room air from May 13, 2010: pH 7.42, PaCO2 56, PaO2 59.  OV 07/01/2010: She is following up after visiting Dr. Craige Cotta on 06/27/2010 for OSA eval; a sleep study is pending. She also had Rt heart cath on 06/23/2010. PA meanis 60, PCWP is 25-30, TRANPULM GRADIENT - 30,  CI is 2.3L, and PVR is 7.1 [facility]. Spironlactone was started. AFter this dyspnea and edema has improved. Case Manager states she is compliant with all meds. No new complaints. She is now motivated to lose weight and get better. DR Bensimohn is recommending adcirca for diastolic dysnf related Pulmnary hypertension. REC: a) WEIGHT LOSS; b) SLEEP APNEA OPTIMIZATION c) AFTER ABOVE ASSESS for [facility]   October 15, 2010: Followup Pulmnary Hypertenstion.  Since last visit I note she attended xtreme makeover class once in Sept 2011. Despite this has actually gained 10# weight. BMI is now > 36. DR Craige Cotta has noticed that CPAP was not working and is in process of setting auto BIPAP 25-5 for her. This has not been set up and she is waiting for machine. IF this fails, he is considering TRACH or SERVO vent. SHe tells me thta dyspnea and edema are better although we note she is now needing more O2. Denies  fever, cough, sputum or any illness     Preventive Screening-Counseling & Management  Alcohol-Tobacco     Alcohol drinks/day: 0     Smoking Status: never  Caffeine-Diet-Exercise     Caffeine use/day: soda     Does Patient Exercise: no  Allergies: No Known Drug Allergies  Past History:  Past medical, surgical, family and social histories (including risk factors) reviewed, and no changes noted (except as noted below).  Past Medical History:  1. Pulmonary hypertension.       a.     On May 09, 2010, 2-D echocardiogram:  Ejection fraction 60%        to 65%, no regional wall motion abnormalities.  Grade 2 diastolic        dysfunction.  Moderately dilated right ventricle with mild-to-        moderately reduced function.  Pulmonary artery systolic pressure        37 mmHg.       b.     Shortness of breath and hypoxia on home oxygen at 2 L per        minute.       c.    Right heart catheterization June 23, 2010: RA pressure 21-25, RV 78/39 with EDP 21, PA pressure 84/44 with mean 60, PCWP 25-30, Fick CO 4.2 L/min, CI 2.3 L/min/m2, PVR 7.1 [facility]. Oxygen saturations>>PA 65%, Femoral artery 95%, SVC 70%.     d. Spirometry June 2011: FEV1  48%, FVC 48%, FEV1% 98     e. ABG on room air May 13, 2010: pH 7.42, PaCO2 56, PaO2 59    f. 05/12/2010 (10/07/2007) CT angio - NEGATIVE FOR PE  2. Severe obstructive sleep apnea.       a.     PSG 06/08/04 RDI 92.1, oxygen nadir 57%  3. Chronic diastolic congestive heart failure.   4. Hypertension.   5. Human immunodeficiency virus diagnosed approximately 2001.   - CD4 340 in August 2011  6. Noncompliance.   7. History of bilateral wrist fractures status post surgical repair in       2007.   8. History of herpes zoster.   9. History of cervical dysplasia.   10.Status post adenoidectomy.   11.Dysphagia.   12. Chronic otitis media  13. Allergic rhinitis  14. Cervical dysplasia  Past Surgical History: Reviewed history from 06/27/2010 and no  changes required. Ear tubes-as a child C-section Sinus surgery 2004 [facility] Bilateral wrist repair-2006 [facility] Adenoidectomy  Past Pulmonary History:  Pulmonary History:  DATE OF ADMISSION:  05/08/2010   DATE OF DISCHARGE:  05/14/2010      DISCHARGE DIAGNOSES:   1. Shortness of breath/hypoxia, multifactorial.   2. Pulmonary hypertension.   3. Severe obstructive sleep apnea.   4. Diastolic heart failure.   5. Hypertension.   6. Human immunodeficiency virus.   Family History: Reviewed history from 06/27/2010 and no changes required. Mother - age 39 ESRD Father - GSW Several family members with hypertension  Social History: Reviewed history from 07/01/2010 and no changes required. Alcohol use-no Drug use-no Marital Status: single Children: 2 Occupation: unemployed  Patient never smoked.   Review of Systems       The patient complains of shortness of breath with activity, shortness of breath at rest, and nasal congestion/difficulty breathing through nose.  The patient denies productive cough, non-productive cough, coughing up blood, chest pain, irregular heartbeats, acid heartburn, indigestion, loss of appetite, weight change, abdominal pain, difficulty swallowing, sore throat, tooth/dental problems, headaches, sneezing, itching, ear ache, anxiety, depression, hand/feet swelling, joint stiffness or pain, rash, change in color of mucus, and fever.    Vital Signs:  Patient profile:   45 year old female Height:      63 inches Weight:      206.13 pounds BMI:     36.65 O2 Sat:      83 % on Room air Temp:     98.4 degrees F oral Pulse rate:   85 / minute BP sitting:   160 / 114  (right arm) Cuff size:   large  Vitals Entered By: Carron Curie CMA (October 15, 2010 11:28 AM)  O2 Flow:  Room air  O2 Sat Comments Placed Leslie Gates on 2 liters oxygen and sats increased to 94%.  CC: Leslie Gates here for follow-up. Leslie Gates states increased SOB x 2 months. Comments Medications reviewed with  patient Carron Curie CMA  October 15, 2010 11:29 AM Daytime phone number verified with patient.    Physical Exam  General:  on supplemental oxygen.   Head:  normocephalic and atraumatic Eyes:  PERRLA/EOM intact; conjunctiva and sclera clear Ears:  TMs intact and clear with normal canals Nose:  clear nasal discharge.   Mouth:  MP 3, no exudate Neck:  no JVD.   Chest Wall:  no deformities noted Lungs:  diminished breath sounds, no wheezing or rales Heart:  regular rhythm, normal rate, and no murmurs.   Abdomen:  bowel sounds positive;  abdomen soft and non-tender without masses, or organomegaly Msk:  no deformity or scoliosis noted with normal posture Pulses:  pulses normal Extremities:  1+ pedal edema.   Neurologic:  normal CN II-XII and strength normal.   Skin:  intact without lesions or rashes Cervical Nodes:  no significant adenopathy Axillary Nodes:  no significant adenopathy Psych:  alert, cooperative   Impression & Recommendations:  Problem # 1:  HYPERTENSION, PULMONARY (ICD-416.8) Assessment Unchanged She is gaining weight - ? due to decompensation Rt CHF or nutrtioon related. SLeep Apnea control is sub-optimal.  Dr Craige Cotta now planning auto BIPAP with plans for trach/servo vnt if auto-BIPA also fails. I will check BMET, BNP to see if there is any element of congestion going on. If so, will add diuresis with lasix. Due the fac PCPW is 25-30 the majore element to correct is PVH due to Heart Failure presevered Ejection FRaction. This is despite the fact that Transpulm gradiaent is 30 and PVR is 7 both suggesting component of PAH.   Orders: TLB-BMP (Basic Metabolic Panel-BMET) (80048-METABOL) TLB-BNP (B-Natriuretic Peptide) (83880-BNPR) Est. Patient Level III (40981)  Medications Added to Medication List This Visit: 1)  Furosemide 20 Mg Tabs (Furosemide) .... Take 1 tablet daily  Patient Instructions: 1)  please try your new bipap machine 2)  you are gaining weight  instead of losing weight 3)  continue to be strick with diet 4)  give blood for testing today 5)  return to see me in 6 months 6)  .... 7)  update 8)  start lasix 20mg  per day 9)  rov 1 month with bmet and bnp check and wieght check Prescriptions: FUROSEMIDE 20 MG TABS (FUROSEMIDE) take 1 tablet daily  #30 x 1   Entered and Authorized by:   Kalman Shan MD   Signed by:   Kalman Shan MD on 10/16/2010   Method used:   Print then Give to Patient   RxID:   [id number]    Immunization History:  Influenza Immunization History:    Influenza:  historical (08/18/2010)

## 2010-12-16 NOTE — Procedures (Signed)
Summary: Oximetry/[facility]  Oximetry/[facility]   Imported By: Sherian Rein 07/14/2010 10:06:36  _____________________________________________________________________  External Attachment:    Type:   Image     Comment:   External Document

## 2010-12-16 NOTE — Miscellaneous (Signed)
Summary: Bridge Counselor  Clinical Lists Changes BC transported pt to [facility] today to file for disability income. Pt's appointment took longer than expected and pt had to reschedule her appointment with [facility]. [facility] called the [facility] to reschedule for pt. Pt's next appointment is August 19th at 1:30pm. [facility] will provide transportation.  Sharol Roussel  June 13, 2010 2:15 PM

## 2010-12-16 NOTE — Assessment & Plan Note (Signed)
Summary: per ramaswamy f/u/MS   Visit Type:  Follow-up Copy to:  Dr. Marchelle Gearing, Dr. Jones Broom Primary Sydne Gates/Referring Leslie Gates:  Hatcher/Vollmer - PMD., Dr Marchelle Gearing - Pulmonary, Dr. Craige Cotta - sleep, Dr. Teressa Lower - Cards  CC:  Pt here for follow-up to discuss heart cath. pt staes breathing is the same. Leslie Gates  History of Present Illness: Obese BMI 33 (40# weighg gain in 2011), AA female, with HIV since 2000 (compliant since summer 2011 following case manager Kim Puff involvement), OSA since 2003 with CPAP noncompliance. Has Cass 3  dyspnea with desaturation at 185 feet of walkingon room air in July 2011. ECHO June 2011 with Grade 2 diast dysfunction. PFTs Spirometry June 2011 suggestive of restriction, CT Chest June 201 without evidence of PE or parencymal disease. ABG on room air from May 13, 2010: pH 7.42, PaCO2 56, PaO2 59.  OV 07/01/2010: She is following up after visiting Dr. Craige Cotta on 06/27/2010 for OSA eval; a sleep study is pending. She also had Rt heart cath on 06/23/2010. PA meanis 60, PCWP is 25-30, CI is 2.3L, and PVR is 7.1 [employer]. Spironlactone was started. AFter this dyspnea and edema has improved. Case Manager states she is compliant with all meds. No new complaints. She is now motivated to lose weight and get better. DR Bensimohn is recommending adcirca for diastolic dysnf related Pulmnary hypertension.   Preventive Screening-Counseling & Management  Alcohol-Tobacco     Smoking Status: never  Current Medications (verified): 1)  Claritin 10 Mg Tabs (Loratadine) .... Take 1 Tablet By Mouth Once A Day 2)  Nasacort Aq 55 Mcg/act  Aers (Triamcinolone Acetonide(Nasal)) .... One Spray Two Times A Day 3)  Proair Hfa 108 (90 Base) Mcg/act Aers (Albuterol Sulfate) .... One Puff Every 6 Hours 4)  Truvada 200-300 Mg  Tabs (Emtricitabine-Tenofovir) .... Take 1 Tablet By Mouth Once A Day 5)  Reyataz 300 Mg Caps (Atazanavir Sulfate) .... Take 1 Tablet By Mouth Once A Day 6)  Norvir 100 Mg Tabs  (Ritonavir) .... Take 1 Tablet By Mouth Once A Day 7)  Coreg 3.125 Mg Tabs (Carvedilol) .... Take 1 Tablet By Mouth Two Times A Day 8)  Spironolactone 25 Mg Tabs (Spironolactone) .... 1/2 Tab Daily 9)  Lisinopril 10 Mg Tabs (Lisinopril) .... Take 1 Tablet By Mouth Once A Day 10)  Naphcon-A 0.025-0.3 %  Soln (Naphazoline-Pheniramine) .... 2 Drops Each Eye Two Times A Day 11)  Apap 325 Mg Tabs (Acetaminophen) .... Take 1 Tablet By Mouth Every 4 Hours As Needed 12)  Multivitamins   Tabs (Multiple Vitamin) .... Take 1 Tablet By Mouth Once A Day  Allergies (verified): No Known Drug Allergies  Past History:  Family History: Last updated: 06/27/2010 Mother - age 41 ESRD Father - GSW Several family members with hypertension  Social History: Last updated: 07/01/2010 Alcohol use-no Drug use-no Marital Status: single Children: 2 Occupation: unemployed  Patient never smoked.   Risk Factors: Alcohol Use: 0 (05/29/2010) Caffeine Use: soda (05/29/2010) Exercise: no (05/29/2010)  Risk Factors: Smoking Status: never (07/01/2010)  Past Medical History: Reviewed history from 06/27/2010 and no changes required.  1. Pulmonary hypertension.       a.     On May 09, 2010, 2-D echocardiogram:  Ejection fraction 60%        to 65%, no regional wall motion abnormalities.  Grade 2 diastolic        dysfunction.  Moderately dilated right ventricle with mild-to-        moderately reduced  function.  Pulmonary artery systolic pressure        37 mmHg.       b.     Shortness of breath and hypoxia on home oxygen at 2 L per        minute.       c.    Right heart catheterization June 23, 2010: RA pressure 21-25, RV 78/39 with EDP 21, PA pressure 84/44 with mean 60, PCWP 25-30, Fick CO 4.2 L/min, CI 2.3 L/min/m2, PVR 7.1 Woods unit. Oxygen saturations>>PA 65%, Femoral artery 95%, SVC 70%.     d. Spirometry June 2011: FEV1 48%, FVC 48%, FEV1% 98     e. ABG on room air May 13, 2010: pH 7.42, PaCO2 56, PaO2  59    2. Severe obstructive sleep apnea.       a.     PSG 06/08/04 RDI 92.1, oxygen nadir 57%  3. Chronic diastolic congestive heart failure.   4. Hypertension.   5. Human immunodeficiency virus diagnosed approximately 2001.   6. Noncompliance.   7. History of bilateral wrist fractures status post surgical repair in       2007.   8. History of herpes zoster.   9. History of cervical dysplasia.   10.Status post adenoidectomy.   11.Dysphagia.   12. Chronic otitis media  13. Allergic rhinitis  14. Cervical dysplasia  Past Surgical History: Reviewed history from 06/27/2010 and no changes required. Ear tubes-as a child C-section Sinus surgery 2004 [facility] Bilateral wrist repair-2006 [facility] Adenoidectomy  Past Pulmonary History:  Pulmonary History:  DATE OF ADMISSION:  05/08/2010   DATE OF DISCHARGE:  05/14/2010      DISCHARGE DIAGNOSES:   1. Shortness of breath/hypoxia, multifactorial.   2. Pulmonary hypertension.   3. Severe obstructive sleep apnea.   4. Diastolic heart failure.   5. Hypertension.   6. Human immunodeficiency virus.   Family History: Reviewed history from 06/27/2010 and no changes required. Mother - age 71 ESRD Father - GSW Several family members with hypertension  Social History: Reviewed history from 05/28/2010 and no changes required. Alcohol use-no Drug use-no Marital Status: single Children: 2 Occupation: unemployed  Patient never smoked.   Review of Systems       The patient complains of shortness of breath with activity and shortness of breath at rest.  The patient denies productive cough, non-productive cough, coughing up blood, chest pain, irregular heartbeats, acid heartburn, indigestion, loss of appetite, weight change, abdominal pain, difficulty swallowing, sore throat, tooth/dental problems, headaches, nasal congestion/difficulty breathing through nose, sneezing, itching, ear ache, anxiety, depression, hand/feet swelling, joint  stiffness or pain, rash, change in color of mucus, and fever.    Vital Signs:  Patient profile:   45 year old female Height:      63 inches Weight:      195 pounds BMI:     34.67 O2 Sat:      63 % on Room air Temp:     98.1 degrees F oral Pulse rate:   80 / minute BP sitting:   124 / 82  (right arm) Cuff size:   regular  Vitals Entered By: Carron Curie CMA (July 01, 2010 10:26 AM)  O2 Flow:  Room air  O2 Sat Comments Pt upon arrival was on room air at 63%. Placed pt on 3 liters o2 and sat increased to 91%.  CC: Pt here for follow-up to discuss heart cath. pt staes breathing is the same.  Comments Medications reviewed  with patient Carron Curie CMA  July 01, 2010 10:31 AM Daytime phone number verified with patient.    Physical Exam  General:  on supplemental oxygen.   Head:  normocephalic and atraumatic Eyes:  PERRLA/EOM intact; conjunctiva and sclera clear Ears:  TMs intact and clear with normal canals Nose:  clear nasal discharge.   Mouth:  MP 3, no exudate Neck:  no JVD.   Chest Wall:  no deformities noted Lungs:  diminished breath sounds, no wheezing or rales Heart:  regular rhythm, normal rate, and no murmurs.   Abdomen:  bowel sounds positive; abdomen soft and non-tender without masses, or organomegaly Msk:  no deformity or scoliosis noted with normal posture Pulses:  pulses normal Extremities:  no clubbing, cyanosis, edema, or deformity noted Neurologic:  normal CN II-XII and strength normal.   Skin:  intact without lesions or rashes Cervical Nodes:  no significant adenopathy Axillary Nodes:  no significant adenopathy Psych:  alert, cooperative   Impression & Recommendations:  Problem # 1:  HYPERTENSION, PULMONARY (ICD-416.8) Assessment Unchanged  Orders: Rehabilitation Referral (Rehab) Est. Patient Level III (16109)  She has multiple possible causes for this, including diastolic dysfunction, obstructive sleep apnea, hypoventilation, and  possibly related to HIV.  However, it appears that the dominant etiology is due to OSA and diastolic dysfunction.  SHe is on spirnolactone since 06/23/2010 with subjetive improvement. I had a long chat with her and case manager Davina Poke. Explained that [facility] is an option but would want her to a) get compliant with CPAP first; b) finish Dr. Evlyn Courier OSA instructionsand CPAP titration'; c) attend nutrtion class to lose wieght; d) attend pulmnary rehab; e) get 6 minute walk test as baseline. Once these are completed, we will consider trial of adcirca. They are agreeable to this plan. Will see her again in 3-4 months  Other Orders: Nutrition Referral (Nutrition)  Patient Instructions: 1)  please see nutrtionist for weight loss 2)  pleas start attending pulmonary rehab 3)  finish up with Dr. Craige Cotta for sleep evaluation  4)  please be compliantwith meds and CPAP 5)  have 6 minute walking test at rehab or here before return 6)  return in 3-4 months 7)  callor come sooner if there are problems    Appended Document: per ramaswamy f/u/MS     Allergies: No Known Drug Allergies  Past Pulmonary History:  Pulmonary History:  DATE OF ADMISSION:  05/08/2010   DATE OF DISCHARGE:  05/14/2010      DISCHARGE DIAGNOSES:   1. Shortness of breath/hypoxia, multifactorial.   2. Pulmonary hypertension.   3. Severe obstructive sleep apnea.   4. Diastolic heart failure.   5. Hypertension.   6. Human immunodeficiency virus.    Other Orders: Est. Patient Level III (60454)

## 2010-12-16 NOTE — Assessment & Plan Note (Signed)
Summary: CHECKUP [MKJ]   Primary Provider:  Hatcher/Jalayah Gutridge  CC:  follow-up visit, pt. not taking HIV meds for one year, right now out of all meds/B/P elevated, pt. very shob, and feet swollen.  History of Present Illness: Pt is a 45 year old female with a history of HTN, HIV and COPDwho returns to clinic after about a year.  She c/o SOB and dyspnea on exertion, orthopnea as well as swelling in her legs.  She denies cough or fever.  No CP. She has not been taking any of her medications for the past year.  Preventive Screening-Counseling & Management  Alcohol-Tobacco     Alcohol drinks/day: 0     Smoking Status: never  Caffeine-Diet-Exercise     Caffeine use/day: soda     Does Patient Exercise: no  Safety-Violence-Falls     Seat Belt Use: yes      Drug Use:  no.    Comments: pt. given condoms   Updated Prior Medication List: not taking any of her meds Current Allergies (reviewed today): No known allergies  Past History:  Past Medical History: Last updated: 03/22/2007 Hypertension  Review of Systems       The patient complains of dyspnea on exertion and peripheral edema.  The patient denies anorexia, fever, weight loss, chest pain, syncope, and abdominal pain.    Vital Signs:  Patient profile:   45 year old female Height:      63 inches (160.02 cm) Weight:      192.0 pounds (87.27 kg) BMI:     34.13 O2 Sat:      87 % on Room air Temp:     98.4 degrees F (36.89 degrees C) oral Pulse rate:   81 / minute BP sitting:   170 / 126  (right arm)  Vitals Entered By: Wendall Mola CMA Duncan Dull) (May 08, 2010 3:01 PM)  O2 Flow:  Room air CC: follow-up visit, pt. not taking HIV meds for one year, right now out of all meds/B/P elevated, pt. very shob,feet swollen Is Patient Diabetic? No Pain Assessment Patient in pain? no      Nutritional Status BMI of > 30 = obese Nutritional Status Detail appetite "ok"  Does patient need assistance? Functional Status Self  care Ambulation Normal   Physical Exam  General:  alert, well-developed, and mild distress.  alert, well-developed, and mild distress.   Head:  normocephalic and atraumatic.  normocephalic and atraumatic.   Mouth:  pharynx pink and moist.  pharynx pink and moist.   Lungs:  decreased breath sounds half way up both lung fields no crackles and no wheezes.  no crackles and no wheezes.   Heart:  normal rate and regular rhythm.  normal rate and regular rhythm.   Extremities:  2+ left pedal edema and 2+ right pedal edema.  2+ left pedal edema.     Impression & Recommendations:  Problem # 1:  SHORTNESS OF BREATH (ICD-786.05)  Pt hypoxic with decreased BS bilateral lower lung fields and peripheral edema.  Probable CHF. Will place on O2 and send to Ed for admission  Orders: Est. Patient Level IV (96789)  Problem # 2:  HYPERTENSION (ICD-401.9)  uncontrolled - off meds admit  Her updated medication list for this problem includes:    Norvasc 10 Mg Tabs (Amlodipine besylate) .Marland Kitchen... Take 1 tablet by mouth once a day

## 2010-12-16 NOTE — Progress Notes (Signed)
Summary: requesting med change  Phone Note Call from Patient   Caller: Patient Summary of Call: Pt. called requesting Kaletra liquid be switched to old regimen in pill form Initial call taken by: Wendall Mola CMA Duncan Dull),  October 20, 2010 3:58 PM  Follow-up for Phone Call        ok Follow-up by: Yisroel Ramming MD,  October 20, 2010 4:08 PM    New/Updated Medications: REYATAZ 300 MG CAPS (ATAZANAVIR SULFATE) Take 1 capsule by mouth once a day NORVIR 100 MG TABS (RITONAVIR) Take 1 tablet by mouth once a day Prescriptions: NORVIR 100 MG TABS (RITONAVIR) Take 1 tablet by mouth once a day  #30 x 5   Entered by:   Wendall Mola CMA ( AAMA)   Authorized by:   Yisroel Ramming MD   Signed by:   Wendall Mola CMA ( AAMA) on 10/20/2010   Method used:   Telephoned to ...       [facility] (mail-order)       [address] [place], [place]  [address]       Ph: [phone]       Fax: [phone]   RxID:   [id number] REYATAZ 300 MG CAPS (ATAZANAVIR SULFATE) Take 1 capsule by mouth once a day  #30 x 5   Entered by:   Wendall Mola CMA ( AAMA)   Authorized by:   Yisroel Ramming MD   Signed by:   Wendall Mola CMA ( AAMA) on 10/20/2010   Method used:   Telephoned to ...       [facility] (mail-order)       [address] [place], [place]  [address]       Ph: [phone]       Fax: [phone]   RxID:   [id number] TRUVADA 200-300 MG  TABS (EMTRICITABINE-TENOFOVIR) Take 1 tablet by mouth once a day  #30 x 5   Entered by:   Wendall Mola CMA ( AAMA)   Authorized by:   Yisroel Ramming MD   Signed by:   Wendall Mola CMA ( AAMA) on 10/20/2010   Method used:   Telephoned to ...       [facility] (mail-order)       [address] [place], [place]  [address]       Ph: [phone]       Fax: [phone]   RxID:   [id number]

## 2010-12-16 NOTE — Assessment & Plan Note (Signed)
Summary: f/u ov/vs   Referring Tija Biss:  Dr. Marchelle Gearing, Dr. Jones Broom Primary Anthonymichael Munday:  Hatcher/Vollmer - PMD., Dr Marchelle Gearing - Pulmonary, Dr. Craige Cotta - sleep, Dr. Teressa Lower - Cards  CC:  follow-up visit, pt. has not taken HAART meds in 2 weeks, difficulty swallowing pills, and B/P elevated.  History of Present Illness: Pt stopped taking her HIV meds because they were too large for her to swallow. She agrees to try liquid Kaletra. She is till undergoing evaluation for her SOB.  Preventive Screening-Counseling & Management  Alcohol-Tobacco     Alcohol drinks/day: 0     Smoking Status: never  Caffeine-Diet-Exercise     Caffeine use/day: soda     Does Patient Exercise: no  Safety-Violence-Falls     Seat Belt Use: yes      Drug Use:  no.    Comments: pt. declined condoms   Updated Prior Medication List: CLARITIN 10 MG TABS (LORATADINE) Take 1 tablet by mouth once a day NASACORT AQ 55 MCG/ACT  AERS (TRIAMCINOLONE ACETONIDE(NASAL)) one spray two times a day PROAIR HFA 108 (90 BASE) MCG/ACT AERS (ALBUTEROL SULFATE) one puff every 6 hours TRUVADA 200-300 MG  TABS (EMTRICITABINE-TENOFOVIR) Take 1 tablet by mouth once a day COREG 3.125 MG TABS (CARVEDILOL) Take 1 tablet by mouth two times a day SPIRONOLACTONE 25 MG TABS (SPIRONOLACTONE) 1/2 tab daily LISINOPRIL 10 MG TABS (LISINOPRIL) Take 1 tablet by mouth once a day NAPHCON-A 0.025-0.3 %  SOLN (NAPHAZOLINE-PHENIRAMINE) 2 drops each eye two times a day APAP 325 MG TABS (ACETAMINOPHEN) Take 1 tablet by mouth every 4 hours as needed MULTIVITAMINS   TABS (MULTIPLE VITAMIN) Take 1 tablet by mouth once a day KALETRA 400-100 MG/5ML SOLN (LOPINAVIR-RITONAVIR) 10ml by mouth once a day  Current Allergies (reviewed today): No known allergies  Past History:  Past Medical History: Last updated: 06/27/2010  1. Pulmonary hypertension.       a.     On May 09, 2010, 2-D echocardiogram:  Ejection fraction 60%        to 65%, no regional wall  motion abnormalities.  Grade 2 diastolic        dysfunction.  Moderately dilated right ventricle with mild-to-        moderately reduced function.  Pulmonary artery systolic pressure        37 mmHg.       b.     Shortness of breath and hypoxia on home oxygen at 2 L per        minute.       c.    Right heart catheterization June 23, 2010: RA pressure 21-25, RV 78/39 with EDP 21, PA pressure 84/44 with mean 60, PCWP 25-30, Fick CO 4.2 L/min, CI 2.3 L/min/m2, PVR 7.1 Woods unit. Oxygen saturations>>PA 65%, Femoral artery 95%, SVC 70%.     d. Spirometry June 2011: FEV1 48%, FVC 48%, FEV1% 98     e. ABG on room air May 13, 2010: pH 7.42, PaCO2 56, PaO2 59    2. Severe obstructive sleep apnea.       a.     PSG 06/08/04 RDI 92.1, oxygen nadir 57%  3. Chronic diastolic congestive heart failure.   4. Hypertension.   5. Human immunodeficiency virus diagnosed approximately 2001.   6. Noncompliance.   7. History of bilateral wrist fractures status post surgical repair in       2007.   8. History of herpes zoster.   9. History of cervical dysplasia.   10.Status  post adenoidectomy.   11.Dysphagia.   12. Chronic otitis media  13. Allergic rhinitis  14. Cervical dysplasia  Review of Systems  The patient denies anorexia, fever, and weight loss.    Vital Signs:  Patient profile:   45 year old female Height:      63 inches (160.02 cm) Weight:      197.8 pounds (89.91 kg) BMI:     35.17 O2 Sat:      94 % on 2 L/min Temp:     98.0 degrees F (36.67 degrees C) oral Pulse rate:   78 / minute BP sitting:   159 / 106  (right arm)  Vitals Entered By: Wendall Mola CMA ( AAMA) (September 16, 2010 2:45 PM)  O2 Flow:  2 L/min CC: follow-up visit, pt. has not taken HAART meds in 2 weeks, difficulty swallowing pills, B/P elevated Is Patient Diabetic? No Pain Assessment Patient in pain? no      Nutritional Status BMI of > 30 = obese Nutritional Status Detail appetite "normal"  Have you  ever been in a relationship where you felt threatened, hurt or afraid?No   Does patient need assistance? Functional Status Self care Ambulation Normal Comments pt. has not been taking HIV meds   Physical Exam  General:  alert, well-developed, well-nourished, and well-hydrated.   Head:  normocephalic and atraumatic.   Lungs:  normal breath sounds.     Impression & Recommendations:  Problem # 1:  HIV DISEASE (ICD-042) Will switch toTruvada and Kaletra. Potential side effects discussed. F/u labs in 6 weeks. 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 Her updated medication list for this problem includes:    Coreg 3.125 Mg Tabs (Carvedilol) .Marland Kitchen... Take 1 tablet by mouth two times a day    Spironolactone 25 Mg Tabs (Spironolactone) .Marland Kitchen... 1/2 tab daily    Lisinopril 10 Mg Tabs (Lisinopril) .Marland Kitchen... Take 1 tablet by mouth once a day  Medications Added to Medication List This Visit: 1)  Kaletra 400-100 Mg/49ml Soln (Lopinavir-ritonavir) .Marland Kitchen.. 10ml by mouth once a day  Other Orders: Est. Patient Level III (13244) Future Orders: T-CD4SP ([facility]) (CD4SP) ... 10/28/2010 T-HIV Viral Load [phone]) ... 10/28/2010 T-Comprehensive Metabolic Panel [id number]) ... 10/28/2010 T-CBC w/Diff ([id number]) ... 10/28/2010  Patient Instructions: 1)  Please schedule a follow-up appointment in 8 weeks, 2 weeks after labs. 2)  schedule for PAP in PAP clinic  Prescriptions: KALETRA 400-100 MG/5ML SOLN (LOPINAVIR-RITONAVIR) 10ml by mouth once a day  #337ml x 5   Entered and Authorized by:   Yisroel Ramming MD   Signed  by:   Yisroel Ramming MD on 09/16/2010   Method used:   Print then Give to Patient   RxID:   [id number]     Appended Document: f/u ov/vs RX for Kaletra called to [facility]

## 2010-12-16 NOTE — Assessment & Plan Note (Signed)
Summary: sleep eval/apc   Visit Type:  Initial Consult Copy to:  Dr. Marchelle Gearing, Dr. Jones Broom Primary Provider/Referring Provider:  Hatcher/Vollmer  CC:  Sleep consult.  Epworth is 21.Marland Kitchen  History of Present Illness: 45 yo female for sleep evaluation with history of severe sleep apnea, hypoventilation, diastolic dysfunction, HIV, and pulmonary hypertension  She was diagnosed with severe sleep apnea in 2005, but has not been consistent with CPAP therapy.  She is being evaluated for dyspnea, hypoxemia, and pulmonary hypertension.  Sleep consultation was requested to determine what interventions could be done to optimize her therapy for sleep apnea.  She goes to sleep at 1 am.  She falls asleep quickly.  She has been trying to be more regular about using CPAP recently.  She has a full face mask.  She is also using 2 liters oxygen 24/7.  She wakes up several times to use the bathroom.  She is still snoring even while using CPAP.  She will also get cramps in her legs.  She gets out of bed at 10am, but still feels sleepy.  She will also get headaches in the morning.  She has problems with sinus congestion, and this can cause some trouble with using CPAP.  She does not use anything to help her sleep, or stay awake.  She does talk in her sleep.  She denies sleep walking, nightmares, or bruxism.  There is no history of restless legs.  She denies sleep hallucinations, sleep paralysis, or cataplexy.  She has gained some weight since her original sleep study.  She denies history of thyroid disease.  She feels anxious about her health problems, but otherwise feels her mood is good.  She had spirometry during recent admission suggestive of restrictive defect.  CT chest from June 2011, did not show evidence for thrombo-embolic disease or lung parenchymal disease.  ABG on room air from May 13, 2010: pH 7.42, PaCO2 56, PaO2 59.  CT of Chest  Procedure date:  05/12/2010  Findings:      1. Pulmonary  hypertension.       a.     On May 09, 2010, 2-D echocardiogram:  Ejection fraction 60%        to 65%, no regional wall motion abnormalities.  Grade 2 diastolic        dysfunction.  Moderately dilated right ventricle with mild-to-        moderately reduced function.  Pulmonary artery systolic pressure        37 mmHg.       b.     Shortness of breath and hypoxia on home oxygen at 2 L per        minute.   2. Severe obstructive sleep apnea.       a.     Using continuous positive airway pressure intermittently.   3. Chronic diastolic congestive heart failure.   4. Hypertension.   5. Human immunodeficiency virus diagnosed approximately 10 years ago.   6. Noncompliance.   7. History of bilateral wrist fractures status post surgical repair in       2007.   8. History of herpes zoster.   9. History of cervical dysplasia.   10.Status post adenoidectomy.   11.Dysphagia.    Preventive Screening-Counseling & Management  Alcohol-Tobacco     Smoking Status: never  Current Medications (verified): 1)  Claritin 10 Mg Tabs (Loratadine) .... Take 1 Tablet By Mouth Once A Day 2)  Naphcon-A 0.025-0.3 %  Soln (Naphazoline-Pheniramine) .... 2 Drops  Each Eye Two Times A Day 3)  Truvada 200-300 Mg  Tabs (Emtricitabine-Tenofovir) .... Take 1 Tablet By Mouth Once A Day 4)  Nasacort Aq 55 Mcg/act  Aers (Triamcinolone Acetonide(Nasal)) .... One Spray Two Times A Day 5)  Proair Hfa 108 (90 Base) Mcg/act Aers (Albuterol Sulfate) .... One Puff Every 6 Hours 6)  Reyataz 300 Mg Caps (Atazanavir Sulfate) .... Take 1 Tablet By Mouth Once A Day 7)  Apap 325 Mg Tabs (Acetaminophen) .... Take 1 Tablet By Mouth Every 4 Hours As Needed 8)  Coreg 3.125 Mg Tabs (Carvedilol) .... Take 1 Tablet By Mouth Two Times A Day 9)  Lisinopril 10 Mg Tabs (Lisinopril) .... Take 1 Tablet By Mouth Once A Day 10)  Multivitamins   Tabs (Multiple Vitamin) .... Take 1 Tablet By Mouth Once A Day 11)  Norvir 100 Mg Tabs (Ritonavir) .... Take  1 Tablet By Mouth Once A Day 12)  Spironolactone 25 Mg Tabs (Spironolactone) .... 1/2 Tab Daily  Allergies (verified): No Known Drug Allergies  Past History:  Past Medical History:  1. Pulmonary hypertension.       a.     On May 09, 2010, 2-D echocardiogram:  Ejection fraction 60%        to 65%, no regional wall motion abnormalities.  Grade 2 diastolic        dysfunction.  Moderately dilated right ventricle with mild-to-        moderately reduced function.  Pulmonary artery systolic pressure        37 mmHg.       b.     Shortness of breath and hypoxia on home oxygen at 2 L per        minute.       c.    Right heart catheterization June 23, 2010: RA pressure 21-25, RV 78/39 with EDP 21, PA pressure 84/44 with mean 60, PCWP 25-30, Fick CO 4.2 L/min, CI 2.3 L/min/m2, PVR 7.1 Woods unit. Oxygen saturations>>PA 65%, Femoral artery 95%, SVC 70%.     d. Spirometry June 2011: FEV1 48%, FVC 48%, FEV1% 98     e. ABG on room air May 13, 2010: pH 7.42, PaCO2 56, PaO2 59    2. Severe obstructive sleep apnea.       a.     PSG 06/08/04 RDI 92.1, oxygen nadir 57%  3. Chronic diastolic congestive heart failure.   4. Hypertension.   5. Human immunodeficiency virus diagnosed approximately 2001.   6. Noncompliance.   7. History of bilateral wrist fractures status post surgical repair in       2007.   8. History of herpes zoster.   9. History of cervical dysplasia.   10.Status post adenoidectomy.   11.Dysphagia.   12. Chronic otitis media  13. Allergic rhinitis  14. Cervical dysplasia  Past Surgical History: Ear tubes-as a child C-section Sinus surgery 2004 [facility] Bilateral wrist repair-2006 [facility] Adenoidectomy  Past Pulmonary History:  Pulmonary History:  DATE OF ADMISSION:  05/08/2010   DATE OF DISCHARGE:  05/14/2010      DISCHARGE DIAGNOSES:   1. Shortness of breath/hypoxia, multifactorial.   2. Pulmonary hypertension.   3. Severe obstructive sleep apnea.   4. Diastolic heart  failure.   5. Hypertension.   6. Human immunodeficiency virus.   Family History: Mother - age 89 ESRD Father - GSW Several family members with hypertension  Vital Signs:  Patient profile:   45 year old female Height:  63 inches (160.02 cm) Weight:      193.25 pounds (87.84 kg) BMI:     34.36 O2 Sat:      80 % on Room air Temp:     98.5 degrees F (36.94 degrees C) oral Pulse rate:   92 / minute BP sitting:   140 / 110  (left arm) Cuff size:   large  Vitals Entered By: Michel Bickers CMA (June 27, 2010 2:55 PM)  O2 Sat at Rest %:  80 O2 Flow:  Room air  O2 Sat Comments Patient placed on 2 liters of oxygen and her sats recovered to 95%. Pulse was 74. Patient brought her oxygen to the appt but did not have it on as she walked from the waiting room to the exam room.Michel Bickers [facility]  June 27, 2010 3:10 PM CC: Sleep consult.  Epworth is 21. Comments Medications reviewed. Daytime phone verified. Michel Bickers CMA  June 27, 2010 3:11 PM   Physical Exam  General:  on supplemental oxygen.   Nose:  clear nasal discharge.   Mouth:  MP 3, no exudate Neck:  no JVD.   Lungs:  diminished breath sounds, no wheezing or rales Heart:  regular rhythm, normal rate, and no murmurs.   Abdomen:  bowel sounds positive; abdomen soft and non-tender without masses, or organomegaly Extremities:  no clubbing, cyanosis, edema, or deformity noted Neurologic:  normal CN II-XII and strength normal.   Cervical Nodes:  no significant adenopathy Psych:  alert, cooperative   Impression & Recommendations:  Problem # 1:  OBSTRUCTIVE SLEEP APNEA (ICD-327.23) She has a history of OSA with sleep study from 2005 showing severe sleep apnea.  She has continued daytime sleepiness.  She was not very compliant with her CPAP before.  She reports having better compliance now.  Will get a copy of her current CPAP download and arrange for an overnight oximetry on CPAP and supplemental oxygen.  Explained that  depending on the results of these tests she may need to have a repeat in-lab titration study and may need to be changed to BPAP.  Problem # 2:  HYPERTENSION, PULMONARY (ICD-416.8) She has multiple possible causes for this, including diastolic dysfunction, obstructive sleep apnea, hypoventilation, and possibly related to HIV.  May be best to optimize her other secondary causes first, and then re-assess whether she needs specific therapy targeting her pulmonary hypertension.  Will defer decision for this to Dr. Marchelle Gearing and Dr. Jones Broom.  Complete Medication List: 1)  Claritin 10 Mg Tabs (Loratadine) .... Take 1 tablet by mouth once a day 2)  Nasacort Aq 55 Mcg/act Aers (Triamcinolone acetonide(nasal)) .... One spray two times a day 3)  Proair Hfa 108 (90 Base) Mcg/act Aers (Albuterol sulfate) .... One puff every 6 hours 4)  Truvada 200-300 Mg Tabs (Emtricitabine-tenofovir) .... Take 1 tablet by mouth once a day 5)  Reyataz 300 Mg Caps (Atazanavir sulfate) .... Take 1 tablet by mouth once a day 6)  Norvir 100 Mg Tabs (Ritonavir) .... Take 1 tablet by mouth once a day 7)  Coreg 3.125 Mg Tabs (Carvedilol) .... Take 1 tablet by mouth two times a day 8)  Spironolactone 25 Mg Tabs (Spironolactone) .... 1/2 tab daily 9)  Lisinopril 10 Mg Tabs (Lisinopril) .... Take 1 tablet by mouth once a day 10)  Naphcon-a 0.025-0.3 % Soln (Naphazoline-pheniramine) .... 2 drops each eye two times a day 11)  Apap 325 Mg Tabs (Acetaminophen) .... Take 1 tablet by mouth every 4 hours  as needed 12)  Multivitamins Tabs (Multiple vitamin) .... Take 1 tablet by mouth once a day  Other Orders: Est. Patient Level V (16109) DME Referral (DME)  Patient Instructions: 1)  Will get report from CPAP machine 2)  Will arrange for oxygen test at night 3)  Follow up in 2 months

## 2010-12-16 NOTE — Miscellaneous (Signed)
Summary: Bridge Counselor  Clinical Lists Changes BC transported pt to [facility] today to see Dr. Arvilla Market. Pt was in to establish a PCP. Pt was very pleased with Dr. Arvilla Market and will return for follow up in a month.  Sharol Roussel  July 08, 2010 11:25 AM

## 2010-12-16 NOTE — Assessment & Plan Note (Signed)
Summary: CLASS/DS   Allergies: No Known Drug Allergies Patient attended a 1 hour [employer]: Lifestyle meeting today. The meeting included information about: healthy food choices - more fruits and vegetables, healthy fats, healthy snacks on a budget- made healthy lower fat grilled cheese & tomato sandwiches on whole wheatbread and learned exercises to keep feet healthy and feeling good.

## 2010-12-16 NOTE — Progress Notes (Signed)
  Phone Note Other Incoming   Request: Send information Summary of Call: Request for records received from [facility]. Request forwarded to [facility].      Appended Document:  Request from [facility] sent to [facility]

## 2010-12-16 NOTE — Letter (Signed)
Summary: Cardiac Catheterization Instructions- Main Lab  [facility], Main Office  [address]   [place], [place] [address]   Phone: [phone]  Fax: [phone]     05/30/2010 MRN: [id number]  [facility] [address] [place], [place]  [address]  Dear Leslie Gates,   You are scheduled for Cardiac Catheterization on Monday 06/23/10 with  Dr. Gala Romney  Please arrive at the [facility] at 7:00 am      a.m./p.m. on the day of your procedure.  1. DIET     __X__ Nothing to eat or drink after midnight except your medications with a sip of water.  2. Come to the [place] office on             for lab work.  The lab at [facility] is open from 8:30 a.m. to 1:30 p.m. and 2:30 p.m. to 5:00 p.m.  The lab at [facility] is open from 7:30 a.m. to 5:30 p.m.  You do not have to be fasting.  3. MAKE SURE YOU TAKE YOUR ASPIRIN.  4. _____ DO NOT TAKE these medications before your procedure:         ________________________________________________________________________       __X__ YOU MAY TAKE ALL of your remaining medications with a small amount of water.      ____ START NEW medications:     ________________________________________________________________________________      ____ Leslie Gates instructions:     ________________________________________________________________________________  5. Plan for one night stay - bring personal belongings (i.e. toothpaste, toothbrush, etc.)  6. Bring a current list of your medications and current insurance cards.  7. Must have a responsible person to drive you home.   8. Someone must be with yu for the first 24 hours after you arrive home.  9. Please wear clothes that are easy to get on and off and wear slip-on shoes.  *Special note: Every effort is made to have your procedure done on time.  Occasionally there are emergencies that present themselves at the hospital that may  cause delays.  Please be patient if a delay does occur.  If you have any questions after you get home, please call the office at the number listed above.  Meredith Staggers, RN

## 2010-12-16 NOTE — Miscellaneous (Signed)
  Clinical Lists Changes  Observations: Added new observation of YEARAIDSPOS: 2005  (10/06/2010 11:33)

## 2010-12-16 NOTE — Miscellaneous (Signed)
Summary: Bridge Counselor  Clinical Lists Changes BC spoke with pt today. Pt reports that she is having to do her sleep apnea test over again because someone called her and told her "they lost the data." Pt reports that she hopes to have this done next week. BC rescheduled pt's missed appointment with Dr. Philipp Deputy for October 26th at [facility] will provide transportation so that pt makes the appointment.

## 2010-12-16 NOTE — Progress Notes (Signed)
Summary: ONO results  Phone Note Call from Patient Call back at Home Phone [phone]   Caller: Patient Call For: sood Reason for Call: Talk to Nurse, Lab or Test Results Summary of Call: pt would like results of her ONO. Initial call taken by: Eugene Gavia,  July 24, 2010 4:13 PM  Follow-up for Phone Call        called [facility] with [facility], ono done 8.24.11 and faxed to [facility] on 9.2.11.  not in EMR, Lawson Fiscal have you seen this ono? Boone Master CNA/MA  July 24, 2010 4:21 PM   Per Lawson Fiscal, results have been recieived.  I called pt and advised that we have results and that [facility] is out of the office until 08/04/10 and we will contact her once results are reviewed.  Pt verbalized understanding and is okay to wait. Follow-up by: Vernie Murders,  July 24, 2010 4:31 PM  Additional Follow-up for Phone Call Additional follow up Details #1::        spoke with pt about CPAP download and ONO. Additional Follow-up by: Coralyn Helling MD,  July 31, 2010 3:25 PM

## 2010-12-16 NOTE — Assessment & Plan Note (Signed)
Summary: POST HOSP SEEN BY SIMONDS/CB   Visit Type:  Hospital Follow-up Copy to:  Hospital Primary Provider/Referring Provider:  Hatcher/Vollmer  CC:  Pt c/o SOB with activity and at rest.  History of Present Illness: 45 year old AA female. OBESE BMI 33.  with HIV for 10 years and nonccompliant on HAART R (CD4 count June 2011 at 330). x. Also Sleep apnea for 7 years and using CPAP irregularly at home but no sleep doctor. At baseline was very functional but past  6 months gained 40# due to dietary indiscretions. After that has had new onset dyspnea on exertion that was initially progressive but hsa stabilized. Class 3 dyspnea. Was admitted for same 6/.23/2011 through 05/14/2010. Had CT angio chest 05/12/2010  that rouled out PE, mediastinal nodes and infiltrates. ECHO 6/24/2011Gr2 diastolic dysfn, showed RV dilatation with reduced EF and PA pressure . PFTs showed restriction in spirometry with Fev1 48% and FVC 48% and normal ratio.  BNP in hospital was 155. She has been referred here for further care. No change in symptoms since discharge. Overall stable. Stil with class 3 dyspnea. Walking desaturation test on room air - she desaturates  Preventive Screening-Counseling & Management  Alcohol-Tobacco     Alcohol drinks/day: 0     Smoking Status: never  Caffeine-Diet-Exercise     Caffeine use/day: soda     Does Patient Exercise: no  Current Medications (verified): 1)  Norvasc 10 Mg Tabs (Amlodipine Besylate) .... Take 1 Tablet By Mouth Once A Day 2)  Hydroxyzine Hcl 25 Mg  Tabs (Hydroxyzine Hcl) .... Take 1 Tablet By Mouth Four Times A Day As Needed 3)  Fioricet 50-325-40 Mg  Tabs (Butalbital-Apap-Caffeine) .... As Needed [place] 4)  Famotidine 20 Mg  Tabs (Famotidine) .... Take 1 Tablet By Mouth Once A Day 5)  Claritin 10 Mg Tabs (Loratadine) .... Take 1 Tablet By Mouth Once A Day 6)  Naphcon-A 0.025-0.3 %  Soln (Naphazoline-Pheniramine) .... 2 Drops Each Eye Two Times A Day 7)   Truvada 200-300 Mg  Tabs (Emtricitabine-Tenofovir) .... Take 1 Tablet By Mouth Once A Day 8)  Nasacort Aq 55 Mcg/act  Aers (Triamcinolone Acetonide(Nasal)) .... One Spray Two Times A Day 9)  Proair Hfa 108 (90 Base) Mcg/act Aers (Albuterol Sulfate) .... One Puff Every 6 Hours 10)  Reyataz 300 Mg Caps (Atazanavir Sulfate) .... Take 1 Tablet By Mouth Once A Day 11)  Norvir 100 Mg Tablets (Ritonavir) .... Take 1 Tablet By Mouth Once A Day 12)  Apap 325 Mg Tabs (Acetaminophen) .... Take 1 Tablet By Mouth Every 4 Hours As Needed 13)  Coreg 3.125 Mg Tabs (Carvedilol) .... Take 1 Tablet By Mouth Two Times A Day 14)  Lisinopril 10 Mg Tabs (Lisinopril) .... Take 1 Tablet By Mouth Once A Day 15)  Multivitamins   Tabs (Multiple Vitamin) .... Take 1 Tablet By Mouth Once A Day  Allergies (verified): No Known Drug Allergies  Past History:  Family History: Last updated: 03/22/2007 Family History Hypertension  Social History: Last updated: 05/28/2010 Alcohol use-no Drug use-no Marital Status: single Children: 2 Occupation: unemployed   Risk Factors: Alcohol Use: 0 (05/28/2010) Caffeine Use: soda (05/28/2010) Exercise: no (05/28/2010)  Risk Factors: Smoking Status: never (05/28/2010)  Past Medical History: SHORTNESS OF BREATH (ICD-786.05) ACUTE SINUSITIS, UNSPECIFIED (ICD-461.9) DYSPNEA ON EXERTION (ICD-786.09) ALLERGIC RHINITIS CAUSE UNSPECIFIED (ICD-477.9) DYSPLASIA OF CERVIX UNSPECIFIED (ICD-622.10) HERPES ZOSTER (ICD-053.9) OTITIS MEDIA, CHRONIC (ICD-382.9) FRACTURE, WRIST, RIGHT (ICD-814.00) FRACTURE, WRIST, LEFT (ICD-814.00) HYPERTENSION (ICD-401.9) HIV DISEASE (ICD-042)  Past Surgical History: Ear tubes-as a child Bilateral wrist repair-2007 Adenoidectomy  Past Pulmonary History:  Pulmonary History:  DATE OF ADMISSION:  05/08/2010   DATE OF DISCHARGE:  05/14/2010      DISCHARGE DIAGNOSES:   1. Shortness of breath/hypoxia, multifactorial.   2. Pulmonary  hypertension.   3. Severe obstructive sleep apnea.   4. Diastolic heart failure.   5. Hypertension.   6. Human immunodeficiency virus.   Social History: Alcohol use-no Drug use-no Marital Status: single Children: 2 Occupation: unemployed   Review of Systems       The patient complains of shortness of breath with activity, shortness of breath at rest, non-productive cough, chest pain, irregular heartbeats, acid heartburn, indigestion, weight change, abdominal pain, difficulty swallowing, sore throat, headaches, nasal congestion/difficulty breathing through nose, sneezing, itching, anxiety, depression, hand/feet swelling, and joint stiffness or pain.  The patient denies productive cough, coughing up blood, loss of appetite, tooth/dental problems, ear ache, rash, change in color of mucus, and fever.    Vital Signs:  Patient profile:   45 year old female Height:      63 inches Weight:      192 pounds BMI:     34.13 O2 Sat:      91 % on Room air Temp:     98.3 degrees F oral Pulse rate:   74 / minute BP sitting:   140 / 92  (left arm) Cuff size:   large  Vitals Entered By: Zackery Barefoot CMA (May 28, 2010 2:30 PM)  O2 Flow:  Room air   Serial Vital Signs/Assessments:  Comments: Ambulatory Pulse Oximetry  Resting; HR_____    02 Sat_____  Lap1 (185 feet)   HR_____   02 Sat_____ Lap2 (185 feet)   HR_____   02 Sat_____    Lap3 (185 feet)   HR_____   02 Sat_____  ___Test Completed without Difficulty _x__Test Stopped due to: pt desat to 86%RA, pt recovered to 95% 2 liters after walk. Zackery Barefoot CMA  May 28, 2010 2:57 PM    By: Zackery Barefoot CMA  Ambulatory Pulse Oximetry  Resting; HR__82___    02 Sat__93%RA___  Lap1 (185 feet)   HR__100___   02 Sat__86%RA___ Lap2 (185 feet)   HR_____   02 Sat_____    Lap3 (185 feet)   HR_____   02 Sat_____  ___Test Completed without Difficulty _x__Test Stopped due to: pt desat to 86%RA, pt recovered to 95% 2 liters after  walk. Zackery Barefoot CMA  May 28, 2010 2:58 PM    By: Zackery Barefoot CMA   CC: Pt c/o SOB with activity and at rest Comments Medications reviewed with patient Verified contact number and pharmacy with patient Zackery Barefoot CMA  May 28, 2010 2:30 PM    Physical Exam  General:  well developed, well nourished, in no acute distressobese.   Head:  normocephalic and atraumatic Eyes:  PERRLA/EOM intact; conjunctiva and sclera clear Ears:  TMs intact and clear with normal canals Nose:  no deformity, discharge, inflammation, or lesions Mouth:  no deformity or lesionsMelampatti Class III.   Neck:  no masses, thyromegaly, or abnormal cervical nodes Chest Wall:  no deformities noted Lungs:  clear bilaterally to auscultation and percussion Heart:  regular rate and rhythm, S1, S2 without murmurs, rubs, gallops, or clicks Abdomen:  bowel sounds positive; abdomen soft and non-tender without masses, or organomegaly Msk:  no deformity or scoliosis noted with normal posture Pulses:  pulses normal Extremities:  no clubbing,  cyanosis, edema, or deformity noted Neurologic:  CN II-XII grossly intact with normal reflexes, coordination, muscle strength and tone Skin:  intact without lesions or rashes Cervical Nodes:  no significant adenopathy Axillary Nodes:  no significant adenopathy Psych:  alert and cooperative; normal mood and affect; normal attention span and concentration   Impression & Recommendations:  Problem # 1:  SLEEP APNEA (ICD-780.57) Assessment New Long time sleep apnea. Irregular CPAP use. No sleep MD. Recent weight gain  plan refer dR. Sood Orders: Sleep Disorder Referral (Sleep Disorder) Est. Patient Level IV (30865)  Problem # 2:  HYPERTENSION, PULMONARY (ICD-416.8) Assessment: New  Seend on ECHO. Need to sort out if due to HIV or Not. If due to HIV then she is eligiigble for advanced PAH Rx  PLAN refer Dr. Teressa Lower for right heart cath  Orders: Cardiology  Referral (Cardiology) Sleep Disorder Referral (Sleep Disorder) Est. Patient Level IV (78469)  Problem # 3:  SHORTNESS OF BREATH (ICD-786.05) Assessment: Unchanged Probably combo of deconditioning, obesity and pulmonary hypertension. Per dc summary PFTs show restriction. I will get these and review. Meanwhile need to proceed with PAH workup with right heart cath Orders: Cardiology Referral (Cardiology) Est. Patient Level IV (62952)  Medications Added to Medication List This Visit: 1)  Claritin 10 Mg Tabs (Loratadine) .... Take 1 tablet by mouth once a day 2)  Apap 325 Mg Tabs (Acetaminophen) .... Take 1 tablet by mouth every 4 hours as needed 3)  Coreg 3.125 Mg Tabs (Carvedilol) .... Take 1 tablet by mouth two times a day 4)  Lisinopril 10 Mg Tabs (Lisinopril) .... Take 1 tablet by mouth once a day 5)  Multivitamins Tabs (Multiple vitamin) .... Take 1 tablet by mouth once a day  Patient Instructions: 1)  plese see Dr. Teressa Lower for heart evaluation 2)  please see Dr. Craige Cotta for sleep eval 3)  return to see me after you see both of them    Immunization History:  Influenza Immunization History:    Influenza:  historical (05/14/2010)    Appended Document: POST HOSP SEEN BY SIMONDS/CB Jesusita Oka, This lady needs right heart cath  Appended Document: POST HOSP SEEN BY SIMONDS/CB we got it. I am out this week but she is scheduled for 8/8. Thanks.   Appended Document: POST HOSP SEEN BY SIMONDS/CB pls schedule fu after seeing both Dr. Craige Cotta and Bensimohn   Appended Document: POST HOSP SEEN BY SIMONDS/CB Spoke with pt and she states she ahs an apt with Dr.bensimhn on 8/8 and Sood 8/12. Pt is scheduled with MR on aug 16 at 10:40. Pt is aware of this apt and verbally stated she understood

## 2010-12-16 NOTE — Miscellaneous (Signed)
Summary: autoCPAP download 07/04/10 to 07/10/10  Clinical Lists Changes Used on 6 of 7 nights with average 3hrs .  Had some problems with airleak.  AHI remained elevated at 33 even with maximal pressure up to 15 cm.  Explained results to pt over the phone.  She will likely do better with BPAP given her OSA and OHS.  Will proceed with arranging in-lab BPAP titration study, and then also determine how much supplemental oxygen she needs. Problems: Added new problem of SLEEP RELATED HYPOVENTILATION/HYPOXEMIA CCE (ICD-327.26) Orders: Added new Referral order of Sleep Disorder Referral (Sleep Disorder) - Signed

## 2010-12-18 NOTE — Letter (Signed)
Summary: generic letter  [facility]  [address]   [place], [place] [address]   Phone: [phone]  Fax: [phone]    10/28/2010  MIKITA LESMEISTER [address] [place], [place]  [address]   TO WHOM IT MAY CONCERN  The following is a list of problems the above mentioned patients suffers from    1. Pulmonary hypertension and hypoxemia     2.  Severe obstructive sleep apnea.       a.     PSG 06/08/04 RDI 92.1, oxygen nadir 57%  3. Chronic diastolic congestive heart failure.   4. Hypertension.   5. Human immunodeficiency virus diagnosed approximately 2001.    6. History of bilateral wrist fractures status post surgical repair in       2007.   7 History of herpes zoster.   8 History of cervical dysplasia.   9.Status post adenoidectomy.   10..Dysphagia.   11 Chronic otitis media  12. Allergic rhinitis  13. Cervical dysplasia  Please do not hesitate to contact our office if you have any further questions

## 2010-12-18 NOTE — Letter (Signed)
Summary: DME/[facility]  DME/[facility]   Imported By: Sherian Rein 12/12/2010 07:35:12  _____________________________________________________________________  External Attachment:    Type:   Image     Comment:   External Document

## 2010-12-18 NOTE — Letter (Signed)
Summary: [facility]  [facility]   Imported By: Lester Granville 12/12/2010 07:43:15  _____________________________________________________________________  External Attachment:    Type:   Image     Comment:   External Document

## 2010-12-18 NOTE — Assessment & Plan Note (Signed)
Summary: Cardiology Nuclear Testing  Nuclear Med Background Indications for Stress Test: Evaluation for Ischemia   History: Asthma, Echo, Heart Catheterization  History Comments: 6/11 Echo: EF= 60% 8/11 Heart Cath: Severe Pulm HTN  Symptoms: DOE, Fatigue, Fatigue with Exertion, Rapid HR, SOB  Symptoms Comments: Exertional (L) arm tingling.   Nuclear Pre-Procedure Cardiac Risk Factors: Hypertension Caffeine/Decaff Intake: none NPO After: 8:00 PM Lungs: Clear IV 0.9% NS with Angio Cath: 18g     IV Site: R Antecubital IV Started by: Stanton Kidney, EMT-P Chest Size (in) 36     Cup Size C     Height (in): 61 Weight (lb): 192 BMI: 36.41 Tech Comments: The patient took lisinopril and amlodipine this am.  Nuclear Med Study 1 or 2 day study:  1 day     Stress Test Type:  Eugenie Birks Reading MD:  Cassell Clement, MD     Referring MD:  D. Bensimhon Resting Radionuclide:  Technetium 89m Tetrofosmin     Resting Radionuclide Dose:  11 mCi  Stress Radionuclide:  Technetium 47m Tetrofosmin     Stress Radionuclide Dose:  33 mCi   Stress Protocol      Max HR:  106 bpm     Predicted Max HR:  177 bpm  Max Systolic BP: 135 mm Hg     Percent Max HR:  59.89 %Rate Pressure Product:  16109  Lexiscan: 0.4 mg   Stress Test Technologist:  Irean Hong,  RN     Nuclear Technologist:  Doyne Keel, CNMT  Rest Procedure  Myocardial perfusion imaging was performed at rest 45 minutes following the intravenous administration of Technetium 60m Tetrofosmin.  Stress Procedure  The patient received IV Lexiscan 0.4 mg over 15-seconds.  Technetium 66m Tetrofosmin injected at 30-seconds.  There were no significant changes with lexiscan.  Quantitative spect images were obtained after a 45 minute delay.  QPS Raw Data Images:  Normal; no motion artifact; normal heart/lung ratio. Stress Images:  Normal homogeneous uptake in all areas of the myocardium. Rest Images:  Normal homogeneous uptake in all areas of the  myocardium. Subtraction (SDS):  No evidence of ischemia. Transient Ischemic Dilatation:  1.12  (Normal <1.22)  Lung/Heart Ratio:  0.21  (Normal <0.45)  Quantitative Gated Spect Images QGS EDV:  87 ml QGS ESV:  27 ml QGS EF:  68 % QGS cine images:  Normal wall motion.  Findings Normal nuclear study      Overall Impression  Exercise Capacity: Lexiscan with no exercise. BP Response: Normal blood pressure response. Clinical Symptoms: No chest pain ECG Impression: No significant ST segment change suggestive of ischemia. Overall Impression: Normal stress nuclear study.  Appended Document: Cardiology Nuclear Testing ok  Appended Document: Cardiology Nuclear Testing Left message to call back   Appended Document: Cardiology Nuclear Testing pt aware

## 2010-12-18 NOTE — Assessment & Plan Note (Signed)
Summary: F/U  //KP   Visit Type:  Follow-up Copy to:  Dr. Marchelle Gearing, Dr. Jones Broom Primary Niraj Kudrna/Referring Hyman Crossan:  Hatcher/Vollmer - PMD., Dr Marchelle Gearing - Pulmonary, Dr. Craige Cotta - sleep, Dr. Teressa Lower - Cards  CC:  OSA follow-up...pt is sleeping on 5 liters O2 at night and says this gives her headaches.Leslie KitchenMarland Kitchenpt has not received new cpap machine from DME.  History of Present Illness: 45 yo female with OSA/OHS, 2nd pulmonary HTN.  She had titration study, but was sub-optimal.  She is now using 5 liters oxygen at night, but has not received her autoBPAP.  She says there is a problem with getting the machine paid for.  Current Medications (verified): 1)  Claritin 10 Mg Tabs (Loratadine) .... Take 1 Tablet By Mouth Once A Day 2)  Nasacort Aq 55 Mcg/act  Aers (Triamcinolone Acetonide(Nasal)) .... One Spray Two Times A Day 3)  Proair Hfa 108 (90 Base) Mcg/act Aers (Albuterol Sulfate) .... One Puff Every 6 Hours 4)  Truvada 200-300 Mg  Tabs (Emtricitabine-Tenofovir) .... Take 1 Tablet By Mouth Once A Day 5)  Coreg 3.125 Mg Tabs (Carvedilol) .... Take 1 Tablet By Mouth Two Times A Day 6)  Spironolactone 25 Mg Tabs (Spironolactone) .... 1/2 Tab Daily 7)  Lisinopril 10 Mg Tabs (Lisinopril) .... Take 1 Tablet By Mouth Once A Day 8)  Naphcon-A 0.025-0.3 %  Soln (Naphazoline-Pheniramine) .... 2 Drops Each Eye Two Times A Day 9)  Apap 325 Mg Tabs (Acetaminophen) .... Take 1 Tablet By Mouth Every 4 Hours As Needed 10)  Multivitamins   Tabs (Multiple Vitamin) .... Take 1 Tablet By Mouth Once A Day 11)  Kaletra 400-100 Mg/30ml Soln (Lopinavir-Ritonavir) .Leslie Gates.. 10ml By Mouth Once A Day 12)  Furosemide 20 Mg Tabs (Furosemide) .... Take 1 Tablet Daily  Allergies (verified): No Known Drug Allergies  Past Pulmonary History:  Pulmonary History:  DATE OF ADMISSION:  05/08/2010   DATE OF DISCHARGE:  05/14/2010      DISCHARGE DIAGNOSES:   1. Shortness of breath/hypoxia, multifactorial.   2. Pulmonary  hypertension.   3. Severe obstructive sleep apnea.   4. Diastolic heart failure.   5. Hypertension.   6. Human immunodeficiency virus.   Vital Signs:  Patient profile:   45 year old female Height:      63 inches (160.02 cm) Weight:      203.38 pounds (92.45 kg) BMI:     36.16 O2 Sat:      91 % on Room air Temp:     97.4 degrees F (36.33 degrees C) oral Pulse rate:   83 / minute BP sitting:   130 / 80  (left arm) Cuff size:   regular  Vitals Entered By: Michel Bickers CMA (October 17, 2010 1:49 PM)  O2 Sat at Rest %:  91 O2 Flow:  Room air CC: OSA follow-up...pt is sleeping on 5 liters O2 at night and says this gives her headaches.Leslie KitchenMarland Kitchenpt has not received new cpap machine from DME Comments Medications reviewed with patient Michel Bickers [facility]  October 17, 2010 1:57 PM   Physical Exam  General:  obese.   Nose:  clear nasal discharge.   Mouth:  MP 3, no exudate, 3+ tonsills Neck:  no JVD.   Lungs:  diminished breath sounds, no wheezing or rales Heart:  regular rhythm, normal rate, and no murmurs.   Extremities:  no clubbing, cyanosis, edema, or deformity noted Cervical Nodes:  no significant adenopathy   Impression & Recommendations:  Problem #  1:  OBSTRUCTIVE SLEEP APNEA (ICD-327.23) Unfortunately she has not received her autoBPAP.  I explained that if BPAP does not work she may need to consider tracheostomy.  Will arrange for ENT evaluation to determine if surgical intervention can assist with acheiving better tolerance of PAP therapy.  I explained to her that surgery is not likely to be currative of her sleep disordered breathing.  Problem # 2:  SLEEP RELATED HYPOVENTILATION/HYPOXEMIA CCE (ICD-327.26) She is to continue BPAP and supplemental oxygen.  Problem # 3:  MORBID OBESITY (ICD-278.01) Explained the importance of weight loss.  Complete Medication List: 1)  Claritin 10 Mg Tabs (Loratadine) .... Take 1 tablet by mouth once a day 2)  Nasacort Aq 55 Mcg/act Aers  (Triamcinolone acetonide(nasal)) .... One spray two times a day 3)  Proair Hfa 108 (90 Base) Mcg/act Aers (Albuterol sulfate) .... One puff every 6 hours 4)  Truvada 200-300 Mg Tabs (Emtricitabine-tenofovir) .... Take 1 tablet by mouth once a day 5)  Coreg 3.125 Mg Tabs (Carvedilol) .... Take 1 tablet by mouth two times a day 6)  Spironolactone 25 Mg Tabs (Spironolactone) .... 1/2 tab daily 7)  Lisinopril 10 Mg Tabs (Lisinopril) .... Take 1 tablet by mouth once a day 8)  Naphcon-a 0.025-0.3 % Soln (Naphazoline-pheniramine) .... 2 drops each eye two times a day 9)  Apap 325 Mg Tabs (Acetaminophen) .... Take 1 tablet by mouth every 4 hours as needed 10)  Multivitamins Tabs (Multiple vitamin) .... Take 1 tablet by mouth once a day 11)  Kaletra 400-100 Mg/79ml Soln (Lopinavir-ritonavir) .Leslie Gates.. 10ml by mouth once a day 12)  Furosemide 20 Mg Tabs (Furosemide) .... Take 1 tablet daily  Other Orders: Est. Patient Level III (08657) ENT Referral (ENT)  Patient Instructions: 1)  Will arrange for autoBPAP 2)  Will arrange for ENT eval 3)  Follow up in 2 months

## 2010-12-18 NOTE — Letter (Signed)
Summary: Plan/[facility]  Plan/[facility]   Imported By: Lester Asbury 10/28/2010 08:45:23  _____________________________________________________________________  External Attachment:    Type:   Image     Comment:   External Document

## 2010-12-18 NOTE — Progress Notes (Signed)
  [facility]  November 03, 2010 12:58 PM

## 2010-12-18 NOTE — Procedures (Signed)
Summary: Oxim[facility]  Oxim[facility]   Imported By: Sherian Rein 12/10/2010 10:40:27  _____________________________________________________________________  External Attachment:    Type:   Image     Comment:   External Document

## 2010-12-18 NOTE — Miscellaneous (Signed)
Summary: walk test  Ambulatory Pulse Oximetry  Resting; HR____68_    02 Sat__97___  Lap1 (185 feet)   HR__92___   02 Sat_96____ Lap2 (185 feet)   HR__105___   02 Sat_88____    Lap3 (185 feet)   HR_____   02 Sat_____  ___Test Completed without Difficulty ___Test Stopped due to:  Clinical Lists Changes

## 2010-12-18 NOTE — Assessment & Plan Note (Signed)
Summary: 1 mth withlabs//jrc   Visit Type:  Follow-up Copy to:  Dr. Marchelle Gearing, Dr. Jones Broom Primary Provider/Referring Provider:  Hatcher/Vollmer - PMD., Dr Marchelle Gearing - Pulmonary, Dr. Craige Cotta - sleep, Dr. Teressa Lower - Cards  CC:  Pt here for one month follow-up. Pt states SOb has been improved since getting Bipap machine.  Marland Kitchen  History of Present Illness: Obese, OSA, HFpEF 60% with diast dysfn, HIV, Pulmonary hypertension on right heart cath with features all c/w HFpEF and related to diast dysfn (PA mean 60, PCWP 25-30, TPG 31, CI 2.3, PVR 7)   November 24, 2010: Followup for pulm htn. Last seen Nov 2011. In interim, has been able to lose 10# weight due to dietary changes and motoivation. Complaint with all meds. Yet to start lasix. Several weeks ago developed bilateral arm tingling and saw Dr. Teressa Lower on 11/12/2010  - nuclear medicine stress test is pending. Otherwise, no new problems. Still with class 3 dyspnea. Desaturated to 88% after walking 155 feet x 2 laps but not using o2 at night. Compliant with bipap at night.    Preventive Screening-Counseling & Management  Alcohol-Tobacco     Alcohol drinks/day: 0     Smoking Status: never  Current Medications (verified): 1)  Claritin 10 Mg Tabs (Loratadine) .... Take 1 Tablet By Mouth Once A Day 2)  Proair Hfa 108 (90 Base) Mcg/act Aers (Albuterol Sulfate) .... One Puff Every 6 Hours 3)  Truvada 200-300 Mg  Tabs (Emtricitabine-Tenofovir) .... Take 1 Tablet By Mouth Once A Day 4)  Spironolactone 25 Mg Tabs (Spironolactone) .... 1/2 Tab Daily 5)  Lisinopril 10 Mg Tabs (Lisinopril) .... Take 1 Tablet By Mouth Once A Day 6)  Naphcon-A 0.025-0.3 %  Soln (Naphazoline-Pheniramine) .... 2 Drops Each Eye Two Times A Day 7)  Apap 325 Mg Tabs (Acetaminophen) .... Take 1 Tablet By Mouth Every 4 Hours As Needed 8)  Multivitamins   Tabs (Multiple Vitamin) .... Take 1 Tablet By Mouth Once A Day 9)  Reyataz 300 Mg Caps (Atazanavir Sulfate) .... Take 1  Capsule By Mouth Once A Day 10)  Norvir 100 Mg Tabs (Ritonavir) .... Take 1 Tablet By Mouth Once A Day 11)  Amlodipine Besylate 10 Mg Tabs (Amlodipine Besylate) .... Once A Day 12)  Lasix 20 Mg Tabs (Furosemide) .... Take 1 Tablet By Mouth Once A Day  Allergies (verified): No Known Drug Allergies  Past History:  Past medical, surgical, family and social histories (including risk factors) reviewed, and no changes noted (except as noted below).  Past Medical History:  #. Pulmonary hypertension.       a.     On May 09, 2010, 2-D echocardiogram:  EF 60%.  Grade 2 diastolic   dysfunction.  Moderately dilated right ventricle with mild-to-       moderately reduced function.  Pulmonary artery systolic pressure       37 mmHg.       b.    Right heart catheterization June 23, 2010: RA pressure 21-25, RV 78/39 with EDP 21, PA pressure 84/44 with mean 60, PCWP 25-30, Fick CO 4.2 L/min, CI 2.3 L/min/m2, PVR 7.1 Woods unit. Oxygen saturations>>PA 65%, Femoral artery 95%, SVC 70%.     d. Spirometry June 2011: FEV1 48%, FVC 48%, FEV1% 98     e. ABG on room air May 13, 2010: pH 7.42, PaCO2 56, PaO2 59       f. 05/12/2010 (10/07/2007) CT angio - NEGATIVE FOR PE  # Severe obstructive  sleep apnea.       a.     PSG 06/08/04 RDI 92.1, oxygen nadir 57%  #. Chronic diastolic congestive heart failure.  #. Hypertension.  # Human immunodeficiency virus diagnosed approximately 2001.   - CD4 340 in August 2011  #. Noncompliance.  #. History of bilateral wrist fractures status post surgical repair in      2007.   #. History of herpes zoster.   #. History of cervical dysplasia.   #.Status post adenoidectomy.   #.Dysphagia.   #. Chronic otitis media  #. Allergic rhinitis  #. Cervical dysplasia #. Obesity  - BMI 36.5, 206# - Nov 2011  - BMI 34.71, 195#  - Jan 2012  Past Surgical History: Reviewed history from 06/27/2010 and no changes required. Ear tubes-as a child C-section Sinus surgery 2004  [facility] Bilateral wrist repair-2006 [facility] Adenoidectomy  Past Pulmonary History:  Pulmonary History:  DATE OF ADMISSION:  05/08/2010   DATE OF DISCHARGE:  05/14/2010      DISCHARGE DIAGNOSES:   1. Shortness of breath/hypoxia, multifactorial.   2. Pulmonary hypertension.   3. Severe obstructive sleep apnea.   4. Diastolic heart failure.   5. Hypertension.   6. Human immunodeficiency virus.   Family History: Reviewed history from 06/27/2010 and no changes required. Mother - age 22 ESRD Father - GSW Several family members with hypertension  Social History: Reviewed history from 07/01/2010 and no changes required. Alcohol use-no Drug use-no Marital Status: single Children: 2 Occupation: unemployed  Patient never smoked.   Review of Systems       The patient complains of shortness of breath with activity, shortness of breath at rest, and nasal congestion/difficulty breathing through nose.  The patient denies productive cough, non-productive cough, coughing up blood, chest pain, irregular heartbeats, acid heartburn, indigestion, loss of appetite, weight change, abdominal pain, difficulty swallowing, sore throat, tooth/dental problems, headaches, sneezing, itching, ear ache, anxiety, depression, hand/feet swelling, joint stiffness or pain, rash, change in color of mucus, and fever.    Vital Signs:  Patient profile:   45 year old female Height:      63 inches Weight:      195.25 pounds BMI:     34.71 O2 Sat:      90 % on Room air Temp:     97.8 degrees F oral Pulse rate:   77 / minute BP sitting:   160 / 120  (right arm) Cuff size:   regular  Vitals Entered By: Carron Curie CMA (November 24, 2010 4:19 PM)  O2 Flow:  Room air CC: Pt here for one month follow-up. Pt states SOb has been improved since getting Bipap machine.   Comments Medications reviewed with patient Carron Curie CMA  November 24, 2010 4:21 PM Daytime phone number verified with  patient.    Physical Exam  General:  obese.  looks well. slimmer than before Head:  normocephalic and atraumatic Eyes:  PERRLA/EOM intact; conjunctiva and sclera clear Ears:  TMs intact and clear with normal canals Nose:  clear nasal discharge.   Mouth:  MP 3, no exudate, 3+ tonsills Neck:  no JVD.   Chest Wall:  no deformities noted Lungs:  diminished breath sounds, no wheezing or rales Heart:  regular rhythm, normal rate, and no murmurs.   Abdomen:  bowel sounds positive; abdomen soft and non-tender without masses, or organomegaly Msk:  no deformity or scoliosis noted with normal posture Pulses:  pulses normal Extremities:  no clubbing, cyanosis,  edema, or deformity noted Neurologic:  normal CN II-XII and strength normal.   Skin:  intact without lesions or rashes Cervical Nodes:  no significant adenopathy Axillary Nodes:  no significant adenopathy Psych:  alert, cooperative   Impression & Recommendations:  Problem # 1:  HYPERTENSION, PULMONARY (ICD-416.8) Assessment Unchanged  she is now losing weight through dietary approach. This is very encouraging. She is yet to start lasix for Diast dysfn. She is not in an exercise program either. I would like her to start lasix and enrol in [facility] rehabilitation. During this time, perhaps with weight loss we can get her sleep apnea more optimized. IF all goes well, we can reassess for advanced [facility] Rx. She is agreeable iwth plan. Meanwhile, we will wait for restults of cardiac stress test  Orders: Est. Patient Level III (47829)  Medications Added to Medication List This Visit: 1)  Lasix 20 Mg Tabs (Furosemide) .... Take 1 tablet by mouth once a day  Patient Instructions: 1)  please continue weight loss 2)  pleast start lasix as soon as possible 3)  i am referring you to pulmonary rehab 4)   - takes 1 month to get in 5)   - start attending that regularly 6)  followup with your other doctors as scheduled 7)  return in 6 months  for followup 8)  at followup can decide on additional testing or meds for pulmnary  hypertenstion

## 2010-12-18 NOTE — Assessment & Plan Note (Signed)
Summary: eph /hypertension-mb   Visit Type:  eph / np Referring Provider:  Dr. Marchelle Gearing, Dr. Jones Broom Primary Provider:  Hatcher/Vollmer - PMD., Dr Marchelle Gearing - Pulmonary, Dr. Craige Cotta - sleep, Dr. Teressa Lower - Cards  CC:  shortness of breath and swelling.  History of Present Illness: [name] is a 45 year old woman with history of obesity, hypertension, HIV, and obstructive sleep apnea who was recently found to have pulmonary hypertension with RV dysfunction on echocardiogram.    I perfromed a RHC on her in August 2011.  RA 21, RV pressure 78/39/21.  Pulmonary artery pressure 84/44 (60). PCWP 25-30.  Fick  4.2 L/2.3 Pulmonary vascular resistance was 7.1 Woods units.  The patient had evidence of Cheyne-Stokes breathing during the procedure secondary to mild sedation.  We started her on spiro for her elevated L heart pressures but we also suggested the possibility of Adcirca as a pulmonary vasodilator.   Has been following with both Dr.s Craige Cotta and [place]. Trying to help her with weight loss and management of OSA. At last visit started BIPAP with plans for trach/servo vnt if auto-BIPAP also fails. Also added lasix. Have not started pulomonary VD therapy yet.   Presents for cardiology f/u post cath. Still SOB with mild activity. Very fatigued. Using BIPAP. [place]lling OK but hasn't started her lasix yet. Mild orthopnea. When she walks notices L arm can tingle. Given that it only heppens with exertion she feels it is her heart. No CP. Has lost about 5 pounds.   Problems Prior to Update: 1)  Sleep Related Hypoventilation/hypoxemia Cce  (ICD-327.26) 2)  Morbid Obesity  (ICD-278.01) 3)  Obstructive Sleep Apnea  (ICD-327.23) 4)  Hypertension, Pulmonary  (ICD-416.8) 5)  Adenoidectomy  () 6)  Shortness of Breath  (ICD-786.05) 7)  Allergic Rhinitis Cause Unspecified  (ICD-477.9) 8)  Dysplasia of Cervix Unspecified  (ICD-622.10) 9)  Herpes Zoster  (ICD-053.9) 10)  Otitis Media, Chronic   (ICD-382.9) 11)  Hypertension  (ICD-401.9) 12)  HIV Disease  (ICD-042)  Medications Prior to Update: 1)  Claritin 10 Mg Tabs (Loratadine) .... Take 1 Tablet By Mouth Once A Day 2)  Proair Hfa 108 (90 Base) Mcg/act Aers (Albuterol Sulfate) .... One Puff Every 6 Hours 3)  Truvada 200-300 Mg  Tabs (Emtricitabine-Tenofovir) .... Take 1 Tablet By Mouth Once A Day 4)  Coreg 3.125 Mg Tabs (Carvedilol) .... Take 1 Tablet By Mouth Two Times A Day 5)  Spironolactone 25 Mg Tabs (Spironolactone) .... 1/2 Tab Daily 6)  Lisinopril 10 Mg Tabs (Lisinopril) .... Take 1 Tablet By Mouth Once A Day 7)  Naphcon-A 0.025-0.3 %  Soln (Naphazoline-Pheniramine) .... 2 Drops Each Eye Two Times A Day 8)  Apap 325 Mg Tabs (Acetaminophen) .... Take 1 Tablet By Mouth Every 4 Hours As Needed 9)  Multivitamins   Tabs (Multiple Vitamin) .... Take 1 Tablet By Mouth Once A Day 10)  Furosemide 20 Mg Tabs (Furosemide) .... Take 1 Tablet Daily 11)  Reyataz 300 Mg Caps (Atazanavir Sulfate) .... Take 1 Capsule By Mouth Once A Day 12)  Norvir 100 Mg Tabs (Ritonavir) .... Take 1 Tablet By Mouth Once A Day  Current Medications (verified): 1)  Claritin 10 Mg Tabs (Loratadine) .... Take 1 Tablet By Mouth Once A Day 2)  Proair Hfa 108 (90 Base) Mcg/act Aers (Albuterol Sulfate) .... One Puff Every 6 Hours 3)  Truvada 200-300 Mg  Tabs (Emtricitabine-Tenofovir) .... Take 1 Tablet By Mouth Once A Day 4)  Coreg 3.125 Mg Tabs (Carvedilol) .Marland KitchenMarland KitchenMarland Kitchen  Take 1 Tablet By Mouth Two Times A Day 5)  Spironolactone 25 Mg Tabs (Spironolactone) .... 1/2 Tab Daily 6)  Lisinopril 10 Mg Tabs (Lisinopril) .... Take 1 Tablet By Mouth Once A Day 7)  Naphcon-A 0.025-0.3 %  Soln (Naphazoline-Pheniramine) .... 2 Drops Each Eye Two Times A Day 8)  Apap 325 Mg Tabs (Acetaminophen) .... Take 1 Tablet By Mouth Every 4 Hours As Needed 9)  Multivitamins   Tabs (Multiple Vitamin) .... Take 1 Tablet By Mouth Once A Day 10)  Furosemide 20 Mg Tabs (Furosemide) .... Take 1  Tablet Daily 11)  Reyataz 300 Mg Caps (Atazanavir Sulfate) .... Take 1 Capsule By Mouth Once A Day 12)  Norvir 100 Mg Tabs (Ritonavir) .... Take 1 Tablet By Mouth Once A Day  Allergies (verified): No Known Drug Allergies  Past History:  Past Medical History: Last updated: 10/15/2010  1. Pulmonary hypertension.       a.     On May 09, 2010, 2-D echocardiogram:  Ejection fraction 60%        to 65%, no regional wall motion abnormalities.  Grade 2 diastolic        dysfunction.  Moderately dilated right ventricle with mild-to-        moderately reduced function.  Pulmonary artery systolic pressure        37 mmHg.       b.     Shortness of breath and hypoxia on home oxygen at 2 L per        minute.       c.    Right heart catheterization June 23, 2010: RA pressure 21-25, RV 78/39 with EDP 21, PA pressure 84/44 with mean 60, PCWP 25-30, Fick CO 4.2 L/min, CI 2.3 L/min/m2, PVR 7.1 Woods unit. Oxygen saturations>>PA 65%, Femoral artery 95%, SVC 70%.     d. Spirometry June 2011: FEV1 48%, FVC 48%, FEV1% 98     e. ABG on room air May 13, 2010: pH 7.42, PaCO2 56, PaO2 59    f. 05/12/2010 (10/07/2007) CT angio - NEGATIVE FOR PE  2. Severe obstructive sleep apnea.       a.     PSG 06/08/04 RDI 92.1, oxygen nadir 57%  3. Chronic diastolic congestive heart failure.   4. Hypertension.   5. Human immunodeficiency virus diagnosed approximately 2001.   - CD4 340 in August 2011  6. Noncompliance.   7. History of bilateral wrist fractures status post surgical repair in       2007.   8. History of herpes zoster.   9. History of cervical dysplasia.   10.Status post adenoidectomy.   11.Dysphagia.   12. Chronic otitis media  13. Allergic rhinitis  14. Cervical dysplasia  Past Surgical History: Last updated: 06/27/2010 Ear tubes-as a child C-section Sinus surgery 2004 [facility] Bilateral wrist repair-2006 [facility] Adenoidectomy  Family History: Last updated: 06/27/2010 Mother - age 55  ESRD Father - GSW Several family members with hypertension  Social History: Last updated: 07/01/2010 Alcohol use-no Drug use-no Marital Status: single Children: 2 Occupation: unemployed  Patient never smoked.   Risk Factors: Alcohol Use: 0 (10/15/2010) Caffeine Use: soda (10/15/2010) Exercise: no (10/15/2010)  Risk Factors: Smoking Status: never (10/15/2010)  Family History: Reviewed history from 06/27/2010 and no changes required. Mother - age 82 ESRD Father - GSW Several family members with hypertension  Social History: Reviewed history from 07/01/2010 and no changes required. Alcohol use-no Drug use-no Marital Status: single Children: 2 Occupation: unemployed  Patient never smoked.   Review of Systems       As per HPI and past medical history; otherwise all systems negative.   Vital Signs:  Patient profile:   45 year old female Height:      63 inches Weight:      199 pounds BMI:     35.38 Pulse rate:   73 / minute BP sitting:   163 / 108  (left arm) Cuff size:   regular  Vitals Entered By: Caralee Ates CMA (November 12, 2010 2:23 PM)  Physical Exam  General:  obese. short of breath with mild exertion.   HEENT: normal Neck: supple. JVP 7. Carotids 2+ bilat; no bruits. No lymphadenopathy or thryomegaly appreciated. Cor: PMI nondisplaced. Regular rate & rhythm. No rubs, gallops. 2/6 SEM RSB prominent P2. no RV lift. Lungs: clear Abdomen: obese soft, nontender, nondistended. No hepatosplenomegaly. No bruits or masses. Good bowel sounds. Extremities: no cyanosis, clubbing, rash, tr-1+ edema Neuro: alert & orientedx3, cranial nerves grossly intact. moves all 4 extremities w/o difficulty. affect pleasant    Impression & Recommendations:  Problem # 1:  Arm tingiling Doubt this is ischemia but she is very concerned about it particularly due to the exertional component. Unable to do treadmill. Will order [employer].  Problem # 2:  HYPERTENSION,  PULMONARY (ICD-416.8) She has both L and R sided components. I agree with Drs. Sood and Magnolia that adequate treatment of OSA and weight loss are the main components of treating her PAH but given degree of PAH I also would consider trial of pulmaonary vasodialtor such as AdCirca. I reinforced need for weight loss and compliance with BiPAP.   Problem # 3:  HYPERTENSION (ICD-401.9) BP remains markedly elevated. Will stop Coreg and switch to Norvasc 10.   Other Orders: Nuclear Stress Test (Nuc Stress Test)  Patient Instructions: 1)  Your physician recommends that you schedule a follow-up appointment as needed 2)  Your physician has recommended you make the following change in your medication: Stop Carvedilol and start Amlodipine 10 mg daily 3)  Your physician has requested that you have an exercise stress myoview.  For further information please visit [email or web address]  Please follow instruction sheet, as given. Prescriptions: AMLODIPINE BESYLATE 10 MG TABS (AMLODIPINE BESYLATE) once a day  #90 x 3   Entered by:   Charolotte Capuchin, RN   Authorized by:   Dolores Patty, MD, [facility]   Signed by:   Charolotte Capuchin, RN on 11/12/2010   Method used:   Faxed to ...       [facility] (mail-order)       [address] [place], [place]  [address]       Ph: [phone]       Fax: [phone]   RxID:   [id number] AMLODIPINE BESYLATE 10 MG TABS (AMLODIPINE BESYLATE) once a day  #30 x 1   Entered by:   Charolotte Capuchin, RN   Authorized by:   Dolores Patty, MD, [facility]   Signed by:   Charolotte Capuchin, RN on 11/12/2010   Method used:   Electronically to        [facility] (retail)       [address]       [place], [place]  [id number]       Ph: [phone]       Fax: [phone]   RxID:   [id number]

## 2010-12-18 NOTE — Progress Notes (Signed)
Summary: Nuclear Pre-Procedure  Phone Note Outgoing Call Call back at [facility] Phone [phone]   Call placed by: Stanton Kidney, EMT-P,  November 25, 2010 2:15 PM Action Taken: Phone Call Completed Summary of Call: Left message with information on Myoview Information Sheet (see scanned document for details). Stanton Kidney, EMT-P  November 25, 2010 2:15 PM     Nuclear Med Background Indications for Stress Test: Evaluation for Ischemia   History: Echo, Heart Catheterization  History Comments: 6/11 Echo: EF= 60% 8/11 Heart Cath: Severe Pulm HTN  Symptoms: DOE, SOB    Nuclear Pre-Procedure Cardiac Risk Factors: Hypertension Height (in): 63

## 2010-12-18 NOTE — Miscellaneous (Signed)
Summary: autoBPAP download from 10/21/10 to 11/19/09  Clinical Lists Changes Used on 28 of 28 nights with average 5hrs 42 min.  Optimal pressure 22/16, but wide pressure range.  Average AHI 8 with minimal airleak.  Will have my nurse inform patient that BPAP report looked good.  Will place order to continue with autoBPAP set up. Orders: Added new Referral order of DME Referral (DME) - Signed  Appended Document: autoBPAP download from 10/21/10 to 11/19/09 Patient is aware BPAP looks good and to continue per Dr. Craige Cotta.

## 2010-12-18 NOTE — Miscellaneous (Signed)
Summary: [facility] DDS   DDS   Imported By: Florinda Marker 11/12/2010 16:40:58  _____________________________________________________________________  External Attachment:    Type:   Image     Comment:   External Document

## 2010-12-18 NOTE — Letter (Signed)
Summary: CMN for DME / SleepMed Therapy  CMN for DME / Sleep[facility] Therapy   Imported By: Lennie Odor 12/09/2010 17:17:14  _____________________________________________________________________  External Attachment:    Type:   Image     Comment:   External Document

## 2010-12-18 NOTE — Progress Notes (Signed)
Summary: letter needed  Phone Note Call from Patient Call back at Home Phone [phone]   Caller: Patient Call For: sood Summary of Call: needs a letter about her condition for social security Initial call taken by: Lacinda Axon,  October 28, 2010 1:08 PM  Follow-up for Phone Call        Pt requesting Drs. Sood and Ramaswamy to write a letter stating pt's condition and what she is being treated for. Pt request same be mailed to her home address. Zackery Barefoot CMA  October 28, 2010 2:18 PM   Additional Follow-up for Phone Call Additional follow up Details #1::        can health port take care of this ? Additional Follow-up by: Kalman Shan MD,  October 28, 2010 2:43 PM    Additional Follow-up for Phone Call Additional follow up Details #2::    [employer] strictly deals with disability forms Vernie Murders  October 28, 2010 3:15 PM   Additional Follow-up for Phone Call Additional follow up Details #3:: Details for Additional Follow-up Action Taken: doone. I do not think Dr. Craige Cotta needs to do another one to replicate the same thing.  Additional Follow-up by: Kalman Shan MD,  October 28, 2010 6:01 PM    Called, spoke with pt.  She is aware letter completed by MR.  Pt mailing address verified and placed in mail per pt request.  Pt also aware no letter should be needed by VS as it will more than likely replicate what MR has done.  Pt will call office back if she feels another letter from VS is needed once letter received from MR.  Gweneth Dimitri RN  October 29, 2010 9:27 AM

## 2010-12-23 ENCOUNTER — Encounter (HOSPITAL_COMMUNITY): Payer: Medicaid Other | Attending: Internal Medicine

## 2010-12-23 DIAGNOSIS — Z5189 Encounter for other specified aftercare: Secondary | ICD-10-CM | POA: Insufficient documentation

## 2010-12-23 DIAGNOSIS — G4733 Obstructive sleep apnea (adult) (pediatric): Secondary | ICD-10-CM | POA: Insufficient documentation

## 2010-12-23 DIAGNOSIS — B20 Human immunodeficiency virus [HIV] disease: Secondary | ICD-10-CM | POA: Insufficient documentation

## 2010-12-23 DIAGNOSIS — G4736 Sleep related hypoventilation in conditions classified elsewhere: Secondary | ICD-10-CM | POA: Insufficient documentation

## 2010-12-23 DIAGNOSIS — R0602 Shortness of breath: Secondary | ICD-10-CM | POA: Insufficient documentation

## 2010-12-23 DIAGNOSIS — I10 Essential (primary) hypertension: Secondary | ICD-10-CM | POA: Insufficient documentation

## 2010-12-30 ENCOUNTER — Encounter (HOSPITAL_COMMUNITY): Payer: Medicaid Other

## 2011-01-01 ENCOUNTER — Encounter (HOSPITAL_COMMUNITY): Payer: Medicaid Other

## 2011-01-01 NOTE — Letter (Signed)
Summary: CMN for Auto Bi-Level/SleepMed  CMN for Auto Bi-Level/SleepMed   Imported By: Sherian Rein 12/23/2010 09:04:03  _____________________________________________________________________  External Attachment:    Type:   Image     Comment:   External Document

## 2011-01-02 ENCOUNTER — Ambulatory Visit: Payer: Medicaid Other | Admitting: Adult Health

## 2011-01-05 ENCOUNTER — Other Ambulatory Visit: Payer: Self-pay

## 2011-01-05 ENCOUNTER — Telehealth (INDEPENDENT_AMBULATORY_CARE_PROVIDER_SITE_OTHER): Payer: Self-pay | Admitting: *Deleted

## 2011-01-06 ENCOUNTER — Encounter (HOSPITAL_COMMUNITY): Payer: Medicaid Other

## 2011-01-08 ENCOUNTER — Encounter (HOSPITAL_COMMUNITY): Payer: Medicaid Other

## 2011-01-13 ENCOUNTER — Encounter (HOSPITAL_COMMUNITY): Payer: Medicaid Other

## 2011-01-13 NOTE — Progress Notes (Signed)
Summary: carrier tank---LMOMTCB x1  Phone Note Call from Patient Call back at [facility] Phone [phone]   Caller: Patient Call For: sood Summary of Call: Pt requests an order for O2 carrier tank. Initial call taken by: Darletta Moll,  January 05, 2011 12:46 PM  Follow-up for Phone Call        [facility].Michel Bickers [facility]  January 05, 2011 2:26 PM  Already done see other PN  Vernie Murders  January 06, 2011 12:38 PM

## 2011-01-13 NOTE — Progress Notes (Signed)
Summary: portable o2  Phone Note Call from Patient Call back at Home Phone [phone] P PH     Caller: Patient Call For: sood Reason for Call: Talk to Nurse Summary of Call: Patient requesting an order for portable o2, she used [facility]. Initial call taken by: Lehman Prom,  January 05, 2011 2:38 PM  Follow-up for Phone Call        Spoke with pt.  She is requesting that order be sent to [facility] for portable o2.  She states that she only uses o2 at hs, but needs portable system for when she has to travel.  Pls advise thanks Follow-up by: Vernie Murders,  January 05, 2011 4:57 PM  Additional Follow-up for Phone Call Additional follow up Details #1::        order sent. Additional Follow-up by: Coralyn Helling MD,  January 05, 2011 4:59 PM    Additional Follow-up for Phone Call Additional follow up Details #2::    called and spoke with pt.  pt aware order sent to DME company for portable oxygen. Marland KitchenArman Filter LPN  January 05, 2011 5:02 PM

## 2011-01-15 ENCOUNTER — Encounter (HOSPITAL_COMMUNITY): Payer: Self-pay

## 2011-01-20 ENCOUNTER — Encounter (HOSPITAL_COMMUNITY): Payer: Self-pay

## 2011-01-22 ENCOUNTER — Encounter (HOSPITAL_COMMUNITY): Payer: Self-pay

## 2011-01-27 ENCOUNTER — Encounter (HOSPITAL_COMMUNITY): Payer: Self-pay

## 2011-01-29 ENCOUNTER — Encounter (HOSPITAL_COMMUNITY): Payer: Self-pay

## 2011-01-29 LAB — T-HELPER CELL (CD4) - (RCID CLINIC ONLY): CD4 % Helper T Cell: 24 % — ABNORMAL LOW (ref 33–55)

## 2011-01-30 LAB — POCT I-STAT 3, VENOUS BLOOD GAS (G3P V)
Acid-Base Excess: 1 mmol/L (ref 0.0–2.0)
Acid-Base Excess: 6 mmol/L — ABNORMAL HIGH (ref 0.0–2.0)
O2 Saturation: 62 %
TCO2: 36 mmol/L (ref 0–100)
TCO2: 38 mmol/L (ref 0–100)
pCO2, Ven: 71.6 mmHg (ref 45.0–50.0)
pCO2, Ven: 76 mmHg (ref 45.0–50.0)
pH, Ven: 7.267 (ref 7.250–7.300)
pO2, Ven: 41 mmHg (ref 30.0–45.0)

## 2011-01-30 LAB — BASIC METABOLIC PANEL
BUN: 8 mg/dL (ref 6–23)
Chloride: 100 mEq/L (ref 96–112)
Glucose, Bld: 83 mg/dL (ref 70–99)
Potassium: 4.2 mEq/L (ref 3.5–5.1)

## 2011-01-30 LAB — CBC
HCT: 44.3 % (ref 36.0–46.0)
MCV: 90.2 fL (ref 78.0–100.0)
RDW: 15.2 % (ref 11.5–15.5)
WBC: 2.9 10*3/uL — ABNORMAL LOW (ref 4.0–10.5)

## 2011-01-30 LAB — POCT I-STAT 3, ART BLOOD GAS (G3+)
O2 Saturation: 94 %
TCO2: 36 mmol/L (ref 0–100)
pCO2 arterial: 70.6 mmHg (ref 35.0–45.0)
pO2, Arterial: 81 mmHg (ref 80.0–100.0)

## 2011-01-30 LAB — APTT: aPTT: 27 seconds (ref 24–37)

## 2011-02-01 LAB — BASIC METABOLIC PANEL
CO2: 38 mEq/L — ABNORMAL HIGH (ref 19–32)
Calcium: 8.9 mg/dL (ref 8.4–10.5)
Calcium: 9.1 mg/dL (ref 8.4–10.5)
Calcium: 9.3 mg/dL (ref 8.4–10.5)
Chloride: 98 mEq/L (ref 96–112)
GFR calc Af Amer: >60 mL/min (ref >60)
GFR calc Af Amer: >60 mL/min (ref >60)
GFR calc Af Amer: >60 mL/min (ref >60)
GFR calc non Af Amer: >60 mL/min (ref >60)
GFR calc non Af Amer: >60 mL/min (ref >60)
Glucose, Bld: 94 mg/dL (ref 70–99)
Potassium: 4.8 mEq/L (ref 3.5–5.1)
Potassium: 4.8 mEq/L (ref 3.5–5.1)
Sodium: 136 mEq/L (ref 135–145)
Sodium: 138 mEq/L (ref 135–145)
Sodium: 139 mEq/L (ref 135–145)

## 2011-02-01 LAB — BLOOD GAS, ARTERIAL
Acid-Base Excess: 11.3 mmol/L — ABNORMAL HIGH (ref 0.0–2.0)
Drawn by: 331471
pCO2 arterial: 56.6 mmHg — ABNORMAL HIGH (ref 35.0–45.0)
pH, Arterial: 7.422 — ABNORMAL HIGH (ref 7.350–7.400)

## 2011-02-01 LAB — COMPREHENSIVE METABOLIC PANEL WITH GFR
ALT: 23 U/L (ref 0–35)
AST: 38 U/L — ABNORMAL HIGH (ref 0–37)
Albumin: 3.2 g/dL — ABNORMAL LOW (ref 3.5–5.2)
Alkaline Phosphatase: 54 U/L (ref 39–117)
BUN: 8 mg/dL (ref 6–23)
CO2: 31 meq/L (ref 19–32)
Calcium: 8.8 mg/dL (ref 8.4–10.5)
Chloride: 104 meq/L (ref 96–112)
Creatinine, Ser: 0.69 mg/dL (ref 0.4–1.2)
GFR calc non Af Amer: >60 mL/min
Glucose, Bld: 78 mg/dL (ref 70–99)
Potassium: 4.2 meq/L (ref 3.5–5.1)
Sodium: 139 meq/L (ref 135–145)
Total Bilirubin: 0.8 mg/dL (ref 0.3–1.2)
Total Protein: 8.2 g/dL (ref 6.0–8.3)

## 2011-02-01 LAB — URINALYSIS, ROUTINE W REFLEX MICROSCOPIC
Bilirubin Urine: NEGATIVE
Glucose, UA: NEGATIVE mg/dL
Hgb urine dipstick: NEGATIVE
Ketones, ur: NEGATIVE mg/dL
Leukocytes, UA: NEGATIVE
Nitrite: NEGATIVE
Protein, ur: 30 mg/dL — AB
Specific Gravity, Urine: 1.012 (ref 1.005–1.030)
Urobilinogen, UA: 0.2 mg/dL (ref 0.0–1.0)
pH: 7.5 (ref 5.0–8.0)

## 2011-02-01 LAB — CBC
MCHC: 32.7 g/dL (ref 30.0–36.0)
Platelets: 117 10*3/uL — ABNORMAL LOW (ref 150–400)
RDW: 14.5 % (ref 11.5–15.5)
WBC: 3 10*3/uL — ABNORMAL LOW (ref 4.0–10.5)

## 2011-02-01 LAB — URINE CULTURE
Colony Count: NO GROWTH
Culture: NO GROWTH

## 2011-02-01 LAB — BRAIN NATRIURETIC PEPTIDE: Pro B Natriuretic peptide (BNP): 155 pg/mL — ABNORMAL HIGH (ref 0.0–100.0)

## 2011-02-01 LAB — HEMOGLOBIN A1C: Mean Plasma Glucose: 103 mg/dL (ref <117)

## 2011-02-01 LAB — TSH: TSH: 1.206 u[IU]/mL (ref 0.350–4.500)

## 2011-02-02 LAB — T-HELPER CELL (CD4) - (RCID CLINIC ONLY)
CD4 % Helper T Cell: 19 % — ABNORMAL LOW (ref 33–55)
CD4 T Cell Abs: 330 uL — ABNORMAL LOW (ref 400–2700)

## 2011-02-03 ENCOUNTER — Telehealth: Payer: Self-pay | Admitting: Pulmonary Disease

## 2011-02-03 ENCOUNTER — Encounter (HOSPITAL_COMMUNITY): Payer: Medicaid Other

## 2011-02-03 NOTE — Telephone Encounter (Signed)
Order was sent to [facility] on 01/05/11 for pt to be evaluated for portable o2.  I called [facility] and was placed on hold for several minutes, so I went ahead and refaxed the order and advised that they need to contact the pt today as this is the second request.  LMOM for the pt to be made aware.

## 2011-02-03 NOTE — Telephone Encounter (Signed)
lmomtcb x1 

## 2011-02-05 ENCOUNTER — Encounter (HOSPITAL_COMMUNITY): Payer: Self-pay

## 2011-02-10 ENCOUNTER — Encounter (HOSPITAL_COMMUNITY): Payer: Self-pay

## 2011-02-12 ENCOUNTER — Encounter (HOSPITAL_COMMUNITY): Payer: Self-pay

## 2011-02-12 NOTE — Telephone Encounter (Signed)
Pt aware order sent to DME for portable oxygen. Carron Curie, CMA

## 2011-02-17 ENCOUNTER — Encounter (HOSPITAL_COMMUNITY): Payer: Medicaid Other | Attending: Critical Care Medicine

## 2011-02-17 DIAGNOSIS — G4736 Sleep related hypoventilation in conditions classified elsewhere: Secondary | ICD-10-CM | POA: Insufficient documentation

## 2011-02-17 DIAGNOSIS — I10 Essential (primary) hypertension: Secondary | ICD-10-CM | POA: Insufficient documentation

## 2011-02-17 DIAGNOSIS — R0602 Shortness of breath: Secondary | ICD-10-CM | POA: Insufficient documentation

## 2011-02-17 DIAGNOSIS — B20 Human immunodeficiency virus [HIV] disease: Secondary | ICD-10-CM | POA: Insufficient documentation

## 2011-02-17 DIAGNOSIS — G4733 Obstructive sleep apnea (adult) (pediatric): Secondary | ICD-10-CM | POA: Insufficient documentation

## 2011-02-17 DIAGNOSIS — Z5189 Encounter for other specified aftercare: Secondary | ICD-10-CM | POA: Insufficient documentation

## 2011-02-19 ENCOUNTER — Encounter (HOSPITAL_COMMUNITY): Payer: Medicaid Other

## 2011-02-24 ENCOUNTER — Encounter (HOSPITAL_COMMUNITY): Payer: Medicaid Other

## 2011-02-26 ENCOUNTER — Encounter (HOSPITAL_COMMUNITY): Payer: Medicaid Other

## 2011-02-26 LAB — T-HELPER CELL (CD4) - (RCID CLINIC ONLY)
CD4 % Helper T Cell: 22 % — ABNORMAL LOW (ref 33–55)
CD4 T Cell Abs: 260 uL — ABNORMAL LOW (ref 400–2700)

## 2011-03-03 ENCOUNTER — Encounter (HOSPITAL_COMMUNITY): Payer: Medicaid Other

## 2011-03-04 ENCOUNTER — Telehealth: Payer: Self-pay | Admitting: Internal Medicine

## 2011-03-04 NOTE — Telephone Encounter (Signed)
Left message to call back  

## 2011-03-04 NOTE — Telephone Encounter (Signed)
Pt states she needs a letter stating her condition.  Pt would like to talk to [employer]

## 2011-03-04 NOTE — Telephone Encounter (Signed)
Pt needs a letter for [employer] stating her medical condition and that she uses portable liquid oxygen during the day and cpap at night, will have Dr Gala Romney complete on Tue 4/24 and let her know when done

## 2011-03-05 ENCOUNTER — Encounter: Payer: Self-pay | Admitting: Adult Health

## 2011-03-05 ENCOUNTER — Encounter (HOSPITAL_COMMUNITY): Payer: Medicaid Other

## 2011-03-05 ENCOUNTER — Ambulatory Visit (INDEPENDENT_AMBULATORY_CARE_PROVIDER_SITE_OTHER): Payer: Medicaid Other | Admitting: Adult Health

## 2011-03-05 DIAGNOSIS — Z79899 Other long term (current) drug therapy: Secondary | ICD-10-CM

## 2011-03-05 DIAGNOSIS — B20 Human immunodeficiency virus [HIV] disease: Secondary | ICD-10-CM

## 2011-03-05 DIAGNOSIS — J309 Allergic rhinitis, unspecified: Secondary | ICD-10-CM

## 2011-03-05 DIAGNOSIS — I272 Pulmonary hypertension, unspecified: Secondary | ICD-10-CM

## 2011-03-05 DIAGNOSIS — I2789 Other specified pulmonary heart diseases: Secondary | ICD-10-CM

## 2011-03-05 LAB — CBC WITH DIFFERENTIAL/PLATELET
Basophils Absolute: 0 10*3/uL (ref 0.0–0.1)
Basophils Relative: 0 % (ref 0–1)
Eosinophils Absolute: 0.3 10*3/uL (ref 0.0–0.7)
Hemoglobin: 13.1 g/dL (ref 12.0–15.0)
MCH: 29 pg (ref 26.0–34.0)
MCHC: 31.3 g/dL (ref 30.0–36.0)
Monocytes Relative: 9 % (ref 3–12)
Neutro Abs: 3.3 10*3/uL (ref 1.7–7.7)
Neutrophils Relative %: 60 % (ref 43–77)
RDW: 14.4 % (ref 11.5–15.5)

## 2011-03-06 LAB — COMPREHENSIVE METABOLIC PANEL
AST: 33 U/L (ref 0–37)
BUN: 10 mg/dL (ref 6–23)
CO2: 32 mEq/L (ref 19–32)
Calcium: 9.4 mg/dL (ref 8.4–10.5)
Chloride: 97 mEq/L (ref 96–112)
Creat: 0.6 mg/dL (ref 0.40–1.20)
Total Bilirubin: 0.4 mg/dL (ref 0.3–1.2)

## 2011-03-06 LAB — LIPID PANEL
Cholesterol: 135 mg/dL (ref 0–200)
HDL: 35 mg/dL — ABNORMAL LOW (ref >39)
Total CHOL/HDL Ratio: 3.9 Ratio

## 2011-03-06 LAB — T-HELPER CELL (CD4) - (RCID CLINIC ONLY): CD4 % Helper T Cell: 19 % — ABNORMAL LOW (ref 33–55)

## 2011-03-09 ENCOUNTER — Other Ambulatory Visit: Payer: Self-pay | Admitting: Licensed Clinical Social Worker

## 2011-03-09 LAB — HIV-1 RNA ULTRAQUANT REFLEX TO GENTYP+
HIV 1 RNA Quant: 26700 copies/mL — ABNORMAL HIGH (ref <20)
HIV-1 RNA Quant, Log: 4.43 {Log} — ABNORMAL HIGH (ref <1.30)

## 2011-03-10 ENCOUNTER — Encounter (HOSPITAL_COMMUNITY): Payer: Medicaid Other

## 2011-03-11 NOTE — Telephone Encounter (Signed)
Dr Gala Romney feels pcp should complete letter since doesn't see pt very frequently, pt is aware and will contact pcp

## 2011-03-12 ENCOUNTER — Encounter (HOSPITAL_COMMUNITY): Payer: Medicaid Other

## 2011-03-13 LAB — HIV-1 GENOTYPR PLUS

## 2011-03-17 ENCOUNTER — Encounter (HOSPITAL_COMMUNITY): Payer: Medicaid Other

## 2011-03-19 ENCOUNTER — Ambulatory Visit (INDEPENDENT_AMBULATORY_CARE_PROVIDER_SITE_OTHER): Payer: Medicaid Other | Admitting: Adult Health

## 2011-03-19 ENCOUNTER — Encounter (HOSPITAL_COMMUNITY): Payer: Medicaid Other

## 2011-03-19 ENCOUNTER — Encounter: Payer: Self-pay | Admitting: Adult Health

## 2011-03-19 VITALS — BP 144/94 | HR 70 | Temp 99.0°F | Ht 62.0 in | Wt 189.0 lb

## 2011-03-19 DIAGNOSIS — B20 Human immunodeficiency virus [HIV] disease: Secondary | ICD-10-CM

## 2011-03-19 NOTE — Progress Notes (Signed)
She presents today for followup for which he was supposed to be evaluated as to how. She is doing with her current antiretroviral medications. However, she just received her medications. 2 days ago and started, and then. Therefore, evaluating her tolerance to the regimen, is little difficult today. She voices no physical complaints and has no specific issues to discuss today. We, therefore, instructed her to return to clinic in 4 weeks, have labs drawn then, and followup with [place] in 6 weeks or sooner if there is a problem. That needs to be addressed. She verbally acknowledged this and agreed with plan.

## 2011-03-19 NOTE — Progress Notes (Signed)
  Subjective:    Patient ID: Leslie Gates, female    DOB: [birth year], 45 y.o.   MRN: [id number]  HPI    Review of Systems     Objective:   Physical Exam        Assessment & Plan:

## 2011-03-24 ENCOUNTER — Encounter (HOSPITAL_COMMUNITY): Payer: Medicaid Other

## 2011-03-26 ENCOUNTER — Encounter (HOSPITAL_COMMUNITY): Payer: Medicaid Other

## 2011-03-31 ENCOUNTER — Encounter (HOSPITAL_COMMUNITY): Payer: Medicaid Other

## 2011-04-02 ENCOUNTER — Encounter (HOSPITAL_COMMUNITY): Payer: Medicaid Other

## 2011-04-03 NOTE — Op Note (Signed)
Leslie Gates, Leslie Gates              ACCOUNT NO.:  [id number]   MEDICAL RECORD NO.:  [id number]          PATIENT TYPE:  INP   LOCATION:  5028                         FACILITY:  [facility]   PHYSICIAN:  Artist Pais. Weingold, M.D.DATE OF BIRTH:  [birth year]   DATE OF PROCEDURE:  02/18/2005  DATE OF DISCHARGE:                                 OPERATIVE REPORT   PREOPERATIVE DIAGNOSIS:  Bilateral intra-articular distal radius fractures  with carpal tunnel symptoms.   POSTOPERATIVE DIAGNOSIS:  Bilateral intra-articular distal radius fractures  with carpal tunnel symptoms.   PROCEDURE:  Open reduction and internal fixation, bilateral distal radial  fractures, with DVR plate and screws, and bilateral carpal tunnel releases  through separate incisions.   SURGEON:  Artist Pais. Mina Marble, M.D.   ASSISTANT:  Aura Fey. Bobbe Medico.   ANESTHESIA:  General.   TOURNIQUET TIME:  45 minutes left, 40 minutes right.   OPERATIVE REPORT:  The patient was taken to the operating room.  After the  induction of adequate general anesthesia, both the right and left upper  extremity were prepped and draped in the usual sterile fashion.  Starting  with the right side, an Esmarch was used to exsanguinate the limb.  The  tourniquet was inflated to 250 mmHg.  At this point in time, a volar  approach to the distal radius was undertaken with a 6 cm incision over the  flexor carpi radialis tendon, the incision was taken down through the skin  and subcutaneous tissue, and the sheath overlying the FCR was incised.  The  FCR was retracted to the midline and the artery to the radial side, and this  interval was developed down to the level of the pronator quadratus.  The  pronator quadratus was subperiosteally stripped off the distal radius volar  aspect.  Closed reduction was performed, including reduction of a large  volar fragment.  The DVR right plate was fastened to the volar aspect of the  radius with a cortical screw in  the slotted hole.  Intraoperative  fluoroscopy then showed good reduction and placement of the plate in both  the AP, lateral and oblique view.  The remaining three screw holes  proximally were placed with the appropriate cortical screws 2.5 mm, followed  by placement of smooth pegs x7 distally to reduce the fracture.  Intraoperative x-ray showed good reduction on both the AP, lateral and  oblique view.  The wound was thoroughly irrigated.  It was loosely closed  with 3-0 Vicryl for the pronator quadratus and a running 3-0 Prolene  subcuticular stitch on the skin.  A second incision was made in the palmar  aspect of the right hand in line with the long finger metacarpal starting at  Kaplan's cardinal line.  The incision was taken down through the skin and  subcutaneous tissues.  The palmar fascia was identified and split.  The  distal edge of the transverse carpal ligament was incised and the median  nerve was identified and protected with a Therapist, nutritional, and the remaining  aspects of the transverse carpal ligament were divided under direct vision  using a curved blunt scissors.  There was a significant amount of blood in  the canal, which was irrigated out, and this was also then loosely closed  with a running 3-0 Prolene subcuticular stitch.  At this point in time the  wound was dressed with Steri-Strips, 4 x 4's, fluffs and a volar splint.  The tourniquet was released.  Attention was then paid to the left side,  where an Esmarch again was used to exsanguinate the limb.  The tourniquet  was inflated to 275 mmHg.  A standard approach was undertaken as per the  right side.  Dissection was carried down through the skin and subcutaneous  tissues.  The radial artery was retracted.  The FCR was retracted.  The  fracture site was identified.  There was significant comminution and dorsal  displacement.  After this was identified and reduced, the plate was fastened  to the volar aspect of the  distal radius as per protocol and intraoperative  fluoroscopy showed good reduction in both the AP, lateral and oblique view.  The three remaining proximal cortical screws were placed, followed by  placement of the seven smooth pegs distally.  Intraoperative x-ray showed  good reduction in both the AP, lateral and oblique view.  The wound was  thoroughly irrigated and then again loosely closed in layers of 3-0 Vicryl  and 3-0 Prolene and then staples on the skin.  A second incision was made in  the palmar aspect of the left hand in line with the long finger metacarpal  starting at Kaplan's cardinal line.  The incision was taken down through the  skin and subcutaneous tissues.  The palmar fascia was identified and split.  The distal edge of the transverse carpal ligament was identified and split  with a 15 blade, thus exposing the underlying median nerve and contents of  the carpal canal.  A Freer elevator was then used to protect the median  nerve and then the remaining aspects of the transverse carpal ligament were  divided under direct vision using a curved blunt scissors.  There was a  significant amount of blood in the canal and this was also irrigated out,  and this was then loosely closed with a running 3-0 Prolene subcuticular  stitch.  Steri-Strips, 4 x 4's, fluffs, a compressive dressing and a volar  splint was applied.  The patient tolerated the procedure well, went to the  recovery room in stable condition.      MAW/MEDQ  D:  02/18/2005  T:  02/18/2005  Job:  [id number]

## 2011-04-03 NOTE — Group Therapy Note (Signed)
   NAME:  Leslie Gates, Leslie Gates                        ACCOUNT NO.:  [id number]   MEDICAL RECORD NO.:  [id number]                   PATIENT TYPE:  OUT   LOCATION:  [facility]                           FACILITY:  [facility]   PHYSICIAN:  Argentina Donovan, M.D.                   DATE OF BIRTH:  09-Jul-1966   DATE OF SERVICE:  06/12/2003                                    CLINIC NOTE   HISTORY OF PRESENT ILLNESS:  The patient is a 45 year old female with a five  year history of HIV who had an atypical Pap smear recently showing high risk  HPV and showing atypical squamous cells, slight atypia.  Colposcopic  examination was carried out.  The cervix was clean and with a very small  opening in this patient that had had previous cesarean sections.  However, I  could see the entire transition zone 360 degrees and it was satisfactory  examination.  There was an acetowhite area around the entire cervix,  especially noted at about 10 o'clock, but no punctation, atypical vessels,  no mosaicism were noted.  An ECC was done and a biopsy of the aforementioned  point.  Pending pathology report.  Atypical Pap smear.                                               Argentina Donovan, M.D.    PR/MEDQ  D:  06/12/2003  T:  06/12/2003  Job:  [id number]

## 2011-04-03 NOTE — Group Therapy Note (Signed)
   NAME:  Leslie Gates, HEFTER                        ACCOUNT NO.:  [id number]   MEDICAL RECORD NO.:  [id number]                   PATIENT TYPE:  OUT   LOCATION:  [facility]                           FACILITY:  [facility]   PHYSICIAN:  Argentina Donovan, M.D.                   DATE OF BIRTH:  January 04, 1966   DATE OF SERVICE:  05/17/2003                                    CLINIC NOTE   SUBJECTIVE:  The patient is a 45 year old G3, P3 who is presenting for  follow-up of abnormal Pap smear.  The patient was suggested to have an  abnormal Pap smear during her pregnancy.  The patient did not have follow-up  Pap smear after delivery in November 2003.  The patient is followed in [facility]  Clinic for HIV positive status.  She is presenting for follow-up of her  abnormal Pap smear.  No vaginal discharge.  No irregular bleeding.   OBJECTIVE:  VITAL SIGNS:  Noted in chart.  GENERAL:  Well-nourished, well-developed female in no acute distress.  GENITOURINARY:  Cervix was quite difficult to visualize secondary to  prolapsed tissue as well as long vault.  Large speculum was required in  addition to a lateral retractor to be used to obtain Pap smear.  Pap smear  was performed without difficulty.  No masses on visual examination.  No  gross abnormalities appreciated.   ASSESSMENT/PLAN:  History of abnormal Pap smear.  We will repeat Pap smear  at this time.  Will contact the patient in the upcoming one to two weeks in  regards to the status of Pap smear and indication for further studies.  The  patient expressed understanding at this time.     Nilda Simmer, M.D.                        Argentina Donovan, M.D.    KS/MEDQ  D:  05/17/2003  T:  05/18/2003  Job:  [id number]
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
## 2011-04-03 NOTE — Op Note (Signed)
NAME:  Leslie Gates, Leslie Gates                        ACCOUNT NO.:  [id number]   MEDICAL RECORD NO.:  [id number]                   PATIENT TYPE:  OIB   LOCATION:  5711                                 FACILITY:  [facility]   PHYSICIAN:  Suzanna Obey, M.D.                    DATE OF BIRTH:  Mar 18, 1966   DATE OF PROCEDURE:  02/14/2003  DATE OF DISCHARGE:                                 OPERATIVE REPORT   PREOPERATIVE DIAGNOSES:  1. Chronic sinusitis.  2. Deviated septum.  3. Turbinate hypertrophy.  4. Adenoid hypertrophy.  5. Chronic serous otitis media.   POSTOPERATIVE DIAGNOSES:  1. Chronic sinusitis.  2. Deviated septum.  3. Turbinate hypertrophy.  4. Adenoid hypertrophy.  5. Chronic serous otitis media.   PROCEDURES:  1. Bilateral maxillary antrostomy.  2. Bilateral ethmoidectomy.  3. Septoplasty.  4. Submucous resection of inferior turbinates.  5. Adenoidectomy.  6. Bilateral myringotomy with tubes.   SURGEON:  Suzanna Obey, M.D.   ANESTHESIA:  General endotracheal.   ESTIMATED BLOOD LOSS:  Approximately 100 mL.   INDICATIONS FOR PROCEDURE:  The patient is a 45 year old who has had almost  a lifelong history of nasal obstruction.  She cannot breathe through her  nose at all.  She has had facial pain and pressure.  She had been treated  medically which has failed to resolve any of her nasal obstruction symptoms.  Her CT scan showed bilateral maxillary thickening and ethmoid thickening.  She has significant turbinate hypertrophy by nasal endoscopy.  She has  bilateral serous effusion with retraction of the right tympanic membrane.  She was informed of the risks and benefits of the procedure including  bleeding, infection, perforation, change in the external appearance of the  nose, numbness of the teeth, CSF leak, change in sense of smell, blindness,  chronic crusting and drying, perforation of the tympanic membrane, hearing  loss, and risks of the anesthetic.  All questions were  answered and consent  was obtained.   DESCRIPTION OF PROCEDURE:  The patient was taken to the operating room and  placed in the supine position.  After adequate general endotracheal tube  anesthesia, she was prepped and draped in the usual sterile manner.  The  oxymetazoline pledgets were placed into the nose bilaterally in the septum,  and the inferior turbinate and middle turbinates were injected with 1%  lidocaine with 1:100,000 epinephrine.   The left hemitransfixion incision was performed raising the  mucoperichondrial and ostial flap. The cartilage was divided about 2 cm  posterior to the caudal strut and the deviated portion of the anterior  cartilage was removed.  The posterior bony portion was removed with Leane Para-  Middleton forceps after elevating the opposite flap.  This corrected the  septal deflection.  The left side was begun with the nasal endoscopy.  There  was a polyp in the middle meatus which was biopsied and microdebrided  out.  Francesca Oman was opened widely and there was purulent material in the  maxillary sinus that was suctioned out.  The maxillary sinus had very  thickened mucosa.  The antrostomy was opened widely with a microdebrider.   The ethmoid cavity was opened and dissection of the ethmoids was created  from posterior to anterior.  There was some thickened material in the  ethmoid cavities.  The pledget of oxymetazoline was placed and the right  side was repeated in a similar fashion.  Again, there was some thick  material that was suctioned out and antrostomy opened widely.  The thickened  mucosa of the maxillary sinus and the uncinate process was removed up to the  attachment of the middle turbinate and then the ethmoid cavity was opened.  Dissection was performed with a microdebrider from posterior to anterior.  There was some thickened material.   The pledget was placed.  The turbinates were then infractured and midline  incision made with a 15 blade,  mucosal flap elevated superiorly.  The  inferior mucosa and bone were removed with turbinate scissors and the edges  cauterized with suction cautery.  The flap was laid back down over the raw  surface and the turbinate was outfractured bilaterally.  The adenoid tissue  could easily be seen coming through the choana up into the nose.  This was  cauterized, injected and then microdebrider was used to remove the portion  extending into the nasal cavity with the nasal endoscopy.   The patient then had placed a Crowe-Davis mouth gag and a red rubber  catheter was inserted.  The adenoid tissue was immense and completely  filling the nasopharynx.  It was cauterized and then microdebrided out.  This was then controlled with hemostasis with suction cautery.  This was  also used to remove the remaining pieces of adenoid tissue.  This opened up  the nasopharynx and choana significantly.  The nasopharynx was irrigated  expressing clear fluid.  The sinuses were then placed with Kyung Rudd packs  soaked in bacitracin in each ethmoid cavity and injected with saline.   The hemitransfixion incision was closed with interrupted  4-0 chromic and 4-  0 plain gut placed to the septum.  Telfa rolls soaked in bacitracin, placed  into the nose bilaterally and secured with a 3-0 nylon.  The Crowe-Davis  mouth gag and red rubber catheter were removed.   The right ear was begun cleaning the cerumen out under otomicroscope  direction.  Myringotomy made in the anterior inferior quadrant.  There was a  lot of retraction of the tympanic membrane and retraction pocket in the  posterior superior quadrant.  There was no evidence of cholesteatoma but  there was some crusted debris that was removed.  There was very thick mucoid  material suctioned from the middle ear and a Paparella tube placed without  difficulty.  Cipro HC was instilled.  The left ear had a lot of myringosclerosis but no retraction and no  cholesteatoma.   An anterior inferior myringotomy and a Paparella tube was  placed without difficulty.   The patient was then awakened and brought to recovery room in stable  condition.  Counts correct.                                               Suzanna Obey, M.D.  JB/MEDQ  D:  02/14/2003  T:  02/14/2003  Job:  [id number]

## 2011-04-03 NOTE — Op Note (Signed)
NAME:  Leslie Gates, Leslie Gates                        ACCOUNT NO.:  [id number]   MEDICAL RECORD NO.:  [id number]                   PATIENT TYPE:  INP   LOCATION:  9180                                 [facility]   PHYSICIAN:  Phil D. Okey Dupre, M.D.                  DATE OF BIRTH:  [birth year]   DATE OF PROCEDURE:  09/16/2002  DATE OF DISCHARGE:                                 OPERATIVE REPORT   PROCEDURES:  1. Low flap cesarean section.  2. Bilateral tubal ligation.   PREOPERATIVE DIAGNOSES:  1. Preeclampsia.  2. Human immunodeficiency virus infection with high viral load.  3. Advanced maternal age.   POSTOPERATIVE DIAGNOSES:  1. Preeclampsia.  2. Human immunodeficiency virus infection with high viral load.  3. Advanced maternal age.   SURGEON:  Javier Glazier. Okey Dupre, M.D.   PROCEDURE WENT AS FOLLOWS:  Under satisfactory spinal anesthesia with the  patient in dorsal supine position, a Foley catheter in the urinary bladder,  the abdomen was prepped and draped in the usual sterile manner and entered  through a Pfannenstiel incision situated 3 cm above the symphysis pubis and  extending for a total length of 16 cm.  The abdomen was entered by layers.  On entering the peritoneal cavity, the visceral peritoneum on the anterior  surface of the uterus was opened transversely by sharp dissection.  The  bladder pushed away from the lower uterine segment which was entered by  sharp and blunt dissection.  From an OT  presentation, the baby boy was  easily delivered.  The cord doubly clamped, divided.  The baby handed to the  pediatrician.  The placenta manually removed.  It was somewhat adherent to  the uterine wall but could be peeled off.  The uterus was then explored to  make sure there was no residual tissue left and the cervix dilated with the  ring forceps.  The uterus was then closed with a continuous running-lock 0  Vicryl in an atraumatic needle.  Each fallopian tube was grasped in the mid  portion with a Babcock clamp.  An opening made in an avascular portion of  the broad ligament beneath the tube with a hemostat and a 1 plain catgut  suture brought through that, tied around the distal and proximal ends of the  tube forming a 2-cm loop above the tube.  A second _____ tie of the same  material was tied just below the aforementioned tie.  The section of tube  above the tie was excised and either ends of the tube within the sutures was  coagulated with hot cautery and the tubes were placed into the peritoneal  cavity.  The area of the uterine incision was observed and oozing a small  amount in the mid portion and a figure-of-eight of 0 Vicryl was used to  control this.  All sutures were then cut short and the fascia  was closed  from either end of the incision meeting in the mid point with continuous  alternating-locked 0 Vicryl on an atraumatic needle.  Hot cautery was used  to control subcutaneous bleeders and skin edges approximated with skin  staples.  A dry sterile dressing was applied.  The  patient tolerated the procedure well and was transferred to the recovery  room in satisfactory condition with the Foley catheter draining clear urine.  At the end of the procedure tape, instrument, sponge, and needle count were  reported correct.                                                Phil D. Okey Dupre, M.D.    PDR/MEDQ  D:  09/16/2002  T:  09/16/2002  Job:  [id number]
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
## 2011-04-03 NOTE — Procedures (Signed)
NAME:  Leslie Gates, Leslie Gates            ACCOUNT NO.:  [id number]   MEDICAL RECORD NO.:  [id number]          PATIENT TYPE:  OUT   LOCATION:  SLEEP CENTER                 FACILITY[facility]  [facility]   PHYSICIAN:  Clinton D. Maple Hudson, M.D. DATE OF BIRTH:  06/27/1966   DATE OF ADMISSION:  06/02/2004  DATE OF DISCHARGE:  06/02/2004                              NOCTURNAL POLYSOMNOGRAM   REFERRING PHYSICIAN:  Dr. Suzanna Obey   INDICATION FOR STUDY:  Hypersomnia with obstructive sleep apnea.  Complaints  of snoring, waking, choking, sleep talking, cough and a postnasal drip,  excessive daytime somnolence with difficulty concentrating.   EPWORTH SLEEPINESS SCORE:  23/24   BODY MASS INDEX:  28.3   WEIGHT:  160 pounds   MEDICATIONS:  No medications were listed.   SLEEP ARCHITECTURE:  Total sleep time recorded 402 minutes with sleep  efficiency of 93%.  Stage I was 4%, stage II 71%, Stages III and IV 3%, REM  21%.  Latency to sleep onset was 9.5 minutes, latency to REM was 24 minutes.  Wake after sleep onset 19 minutes.  Arousal index 36 times per hour, most  associated with respiratory events.   RESPIRATORY DATA:  Split-study protocol.  Severe obstructive sleep  apnea/hypopnea syndrome, RDI 92.1 with 202 obstructive apneas, 1 central  apnea, 18 hypopneas recorded before initiation of CPAP.  She slept only  supine.  REM RDI was 48.1/hr.  CPAP was titrated to 17 CWP, RDI of 0.6/hr  using a medium Respironics full face mask with heated humidifier which gave  good control.   OXYGEN DATA:  Before CPAP control snoring was moderate to loud with severe  oxygen desaturation to 57%.  Oxygenation was normal on CPAP.   CARDIAC DATA:  Normal sinus rhythm with occasional PACs.   MOVEMENT/PARASOMNIA:  Fifty seven body jerks were recorded of which eight  were associated with arousals for a periodic limb movement with arousal  index of 1.2/hr, consistent with very mild periodic limb movement syndrome.  This is  difficult to distinguish from arousals and jerks associated with  apneic events and can be reassessed after CPAP control if appropriate.   IMPRESSION/RECOMMENDATION:  Severe obstructive sleep apnea/hypopnea syndrome  with Respiratory Disturbance Index 92/hr.  Good control on continuous  positive airway pressure at 17 CWP.  Very mild periodic limb movement.                                   ______________________________                                Rennis Chris. Maple Hudson, M.D.                                Diplomate, American Board of Sleep Medicine    CDY/MEDQ  D:  06/08/2004 11:33:53  T:  06/09/2004 10:38:31  Job:  [id number]

## 2011-04-03 NOTE — Discharge Summary (Signed)
NAME:  Leslie Gates, Leslie Gates                        ACCOUNT NO.:  [id number]   MEDICAL RECORD NO.:  [id number]                   PATIENT TYPE:  INP   LOCATION:  9146                                 FACILITY:  [facility]   PHYSICIAN:  Phil D. Okey Dupre, M.D.                  DATE OF BIRTH:  [birth year]   DATE OF ADMISSION:  09/13/2002  DATE OF DISCHARGE:  09/19/2002                                 DISCHARGE SUMMARY   DISCHARGE DIAGNOSES:  1. Status post low transverse cesarean section with bilateral tubal ligation     postoperative day number three.  2. Severe preeclampsia.  3. Human immunodeficiency virus.   DISCHARGE MEDICATIONS:  1. Percocet 5/325 mg one p.o. q.4h. p.r.n. number 20.  2. Ibuprofen 600 mg one p.o. q.6h. p.r.n. number 20.   DISCHARGE INSTRUCTIONS:  Include the OB teaching booklet.  [facility].   HOSPITAL COURSE:  The patient is a 45 year old African-American female that  presented to [facility] G3, P2-0-0-1 at 33 and 3 weeks that was sent  to [facility] for an evaluation for increased blood pressures.  Prior to pregnancy patient had a history of HIV and had a history of IUGR in  a previous pregnancy and the baby died of SIDS.  She also has advanced  maternal age and prior to pregnancy was on HIV medications, prenatal  vitamins, and iron.   OB HISTORY:  She had spontaneous vaginal delivery x2.  She has a history of  herpes and a history of Pap with LGSIL and HPV, also a history of fibroids.   PAST MEDICAL HISTORY:  She is HIV positive with a viral load of greater than  4000 on August 2003 diagnosed about three years ago.  CD-4 count 200 in  August 2003.   FAMILY HISTORY:  She lives with her daughter.  Denies any tobacco, alcohol,  or drug use.   PRENATAL LABORATORY RESULTS:  She is syphilis negative.  HIV positive.  Gonorrhea negative.  Hepatitis B surface antigen negative.  Her most recent  ultrasound was one week prior showing a decreased amniotic fluid index at  6.2 given her early IUGR in the past.  On presentation her cervix was 2.4 cm  and shortened.  On admission she was afebrile, vital signs stable with a  blood pressure of 166/110-149/96.  Original laboratories revealed an LDH of  157.   The patient was admitted to floor and started on magnesium.  Blood pressure  was monitored.  Uric acid and LDH levels improved on admission.  HIV  medications were continued.  The patient was complaining of right upper  quadrant pain on October 31.  Blood pressure remained 160/107.  She had  trace edema.  Platelet count was 197,000, hemoglobin 10.9, uric acid 5.3,  and her 24-hour urine showed 462  mg of protein.  Due to her severe pregnancy  induced hypertension and based on her growth restriction, oligohydramnios,  abnormal Doppler ultrasound, and high viral load HIV, a cesarean section was  planned for the morning of September 16, 2002.  A low transverse cesarean  section with bilateral tubal ligation was performed on September 16, 2002  without complications.  The patient tolerated the procedure well and was  diuresing well after the procedure.  Her magnesium sulfate was discontinued  24 hours after delivery and patient was transferred to the floor.  She  remained on her HIV medications and blood pressure remained under good  control, currently at 150/90.  On discharge day blood pressure has gone down  to 138/80.  Staples have been removed.  The patient will be discharged home.  Baby remains in the NICU.  She desires circumcision after discharge and baby  [facility].     Lorne Skeens, D.O.                         Phil D. Okey Dupre, M.D.    Erick Alley  D:  09/19/2002  T:  09/20/2002  Job:  [id number]

## 2011-04-07 ENCOUNTER — Encounter (HOSPITAL_COMMUNITY): Payer: Medicaid Other

## 2011-04-09 ENCOUNTER — Encounter (HOSPITAL_COMMUNITY): Payer: Medicaid Other

## 2011-04-09 ENCOUNTER — Other Ambulatory Visit: Payer: Medicaid Other

## 2011-04-10 ENCOUNTER — Telehealth: Payer: Self-pay | Admitting: Internal Medicine

## 2011-04-10 ENCOUNTER — Encounter: Payer: Self-pay | Admitting: *Deleted

## 2011-04-10 ENCOUNTER — Ambulatory Visit (INDEPENDENT_AMBULATORY_CARE_PROVIDER_SITE_OTHER)
Admission: RE | Admit: 2011-04-10 | Discharge: 2011-04-10 | Disposition: A | Discharge status: Home / Self Care | Payer: Medicaid Other | Source: Ambulatory Visit | Attending: Adult Health | Admitting: Adult Health

## 2011-04-10 ENCOUNTER — Ambulatory Visit (INDEPENDENT_AMBULATORY_CARE_PROVIDER_SITE_OTHER): Payer: Medicaid Other | Admitting: Adult Health

## 2011-04-10 VITALS — BP 126/90 | HR 88 | Temp 100.2°F | Ht 62.0 in | Wt 189.0 lb

## 2011-04-10 DIAGNOSIS — J209 Acute bronchitis, unspecified: Secondary | ICD-10-CM

## 2011-04-10 DIAGNOSIS — R0602 Shortness of breath: Secondary | ICD-10-CM

## 2011-04-10 MED ORDER — LEVALBUTEROL HCL 0.63 MG/3ML IN NEBU
0.6300 mg | INHALATION_SOLUTION | Freq: Once | RESPIRATORY_TRACT | Status: AC
  Administered 2011-04-10: 0.63 mg via RESPIRATORY_TRACT

## 2011-04-10 NOTE — Assessment & Plan Note (Signed)
Complicated female with underlying HIV+ with questionable med compliance w/ associated pulmonary HTN Hypoxia and OSA/OHS on BIPAP - Off her O2 she has significant desaturations , on O2 sats are adequate.  Have recommended admission to hospital -she declines.  Will begin on Avelox 400mg  daily for 7 days Mucinex DM Twice daily  As needed  Cough/congestion Fluids and rest  follow up in 2 weeks and As needed   Wear O2 continuously at 4 l/m , along w/ bipap at At bedtime   Advised if getting worse or not improving will need to be seen sooner or go to ER.

## 2011-04-10 NOTE — Assessment & Plan Note (Signed)
fdsf

## 2011-04-10 NOTE — Patient Instructions (Addendum)
Avelox 400mg  daily for 7 days--samples given  Mucinex DM Twice daily  As needed  Cough/congestion Fluids and rest.  I will call with xray results.  Please contact office for sooner follow up if symptoms do not improve or worsen or seek emergency care  follow up 2 weeks Dr. Marchelle Gearing and As needed

## 2011-04-10 NOTE — Progress Notes (Signed)
Subjective:    Patient ID: Leslie Gates, female    DOB: 01/05/1966, 45 y.o.   MRN: [id number]  HPI Obese BMI 56 AA female, with HIV since 2000 (following case manager Davina Poke ), OSA since 2003 with CPAP noncompliance. Has Cass 3 dyspnea with desaturation at 185 feet of walkingon room air in July 2011. ECHO June 2011 with Grade 2 diast dysfunction. PFTs Spirometry June 2011 suggestive of restriction, CT Chest June 201 without evidence of PE or parencymal disease. ABG on room air from May 13, 2010: pH 7.42, PaCO2 56, PaO2 59.   OV 07/01/2010: She is following up after visiting Dr. Craige Cotta on 06/27/2010 for OSA eval; a sleep study is pending. She also had Rt heart cath on 06/23/2010. PA meanis 60, PCWP is 25-30, TRANPULM GRADIENT - 30, CI is 2.3L, and PVR is 7.1 Woods Unit. Spironlactone was started. AFter this dyspnea and edema has improved. Case Manager states she is compliant with all meds. No new complaints. She is now motivated to lose weight and get better. DR Bensimohn is recommending adcirca for diastolic dysnf related Pulmnary hypertension. REC: a) WEIGHT LOSS; b) SLEEP APNEA OPTIMIZATION c) AFTER ABOVE ASSESS for [facility]   October 15, 2010: Followup Pulmnary Hypertenstion. Since last visit I note she attended xtreme makeover class once in Sept 2011. Despite this has actually gained 10# weight. BMI is now > 36. DR Craige Cotta has noticed that CPAP was not working and is in process of setting auto BIPAP 25-5 for her. This has not been set up and she is waiting for machine. IF this fails, he is considering TRACH or SERVO vent. SHe tells me thta dyspnea and edema are better although we note she is now needing more O2. Denies fever, cough, sputum or any illness   November 24, 2010: Followup for pulm htn. Last seen Nov 2011. In interim, has been able to lose 10# weight due to dietary changes and motoivation. Complaint with all meds. Yet to start lasix. Several weeks ago developed bilateral arm tingling  and saw Dr. Teressa Lower on 11/12/2010 - nuclear medicine stress test is pending. Otherwise, no new problems. Still with class 3 dyspnea. Desaturated to 88% after walking 155 feet x 2 laps but not using o2 at night. Compliant with bipap at night.    04/10/11 Acute OV  Pt presents for an acute office visit. Complains of prod cough with yellow mucus, wheezing, increased SOB x2days. She did not wear her O2 to the office today. Sats were low on arrival at 76% , she was placed on O2 at 4 l/m at 94%. She says she has not been taking her antivirals meds for her HIV due to diarrhea. Says Dr. Ninetta Lights is aware however on review did not see that in the last ID clinic notes.   Says she is has good appetite w/ no n/v. Says she was doing good and has been wearing her O2 at home at all times. At home she wears O2 at 4 l/m continuous. Says she uses her BIPAP each night.   No leg swelling or chest pain.  I have recommended hospitlization today however she declines.     Past Medical History:  1. Pulmonary hypertension.  a. On May 09, 2010, 2-D echocardiogram: Ejection fraction 60%  to 65%, no regional wall motion abnormalities. Grade 2 diastolic  dysfunction. Moderately dilated right ventricle with mild-to-  moderately reduced function. Pulmonary artery systolic pressure  37 mmHg.  b. Shortness of breath  and hypoxia on home oxygen at 2 L per minute.  c. Right heart catheterization June 23, 2010: RA pressure 21-25, RV 78/39 with EDP 21, PA pressure 84/44 with mean 60, PCWP 25-30, Fick CO 4.2 L/min, CI 2.3 L/min/m2, PVR 7.1 Woods unit. Oxygen saturations>>PA 65%, Femoral artery 95%, SVC 70%.  d. Spirometry June 2011: FEV1 48%, FVC 48%, FEV1% 98  e. ABG on room air May 13, 2010: pH 7.42, PaCO2 56, PaO2 59  f. 05/12/2010 (10/07/2007) CT angio - NEGATIVE FOR PE  2. Severe obstructive sleep apnea.  a. PSG 06/08/04 RDI 92.1, oxygen nadir 57%  3. Chronic diastolic congestive heart failure.  4. Hypertension.  5. Human  immunodeficiency virus diagnosed approximately 2001.  - CD4 340 in August 2011  6. Noncompliance.  7. History of bilateral wrist fractures status post surgical repair in  2007.  8. History of herpes zoster.  9. History of cervical dysplasia.  10.Status post adenoidectomy.  11.Dysphagia.  12. Chronic otitis media  13. Allergic rhinitis  14. Cervical dysplasia     Review of Systems Constitutional:   No  weight loss, night sweats,  Fevers, chills,  +fatigue, or  lassitude.  HEENT:   No headaches,  Difficulty swallowing,  Tooth/dental problems, or  Sore throat,                No sneezing, itching, ear ache, nasal congestion, post nasal drip,   CV:  No chest pain,  Orthopnea, PND, swelling in lower extremities, anasarca, dizziness, palpitations, syncope.   GI  No heartburn, indigestion, abdominal pain, nausea, vomiting, diarrhea, change in bowel habits, loss of appetite, bloody stools.   Resp:+ shortness of breath with exertion      No coughing up of blood.    No chest wall deformity  Skin: no rash or lesions.  GU: no dysuria, change in color of urine, no urgency or frequency.  No flank pain, no hematuria   MS:  No joint pain or swelling.  No decreased range of motion.  Marland Kitchen  Psych:  No change in mood or affect. No depression or anxiety.            Objective:   Physical Exam GEN: A/Ox3; pleasant , NAD, obese   HEENT:  [place]/AT,  EACs-clear, TMs-wnl, NOSE-clear, THROAT-clear, no lesions, no postnasal drip or exudate noted.   NECK:  Supple w/ fair ROM; no JVD; normal carotid impulses w/o bruits; no thyromegaly or nodules palpated; no lymphadenopathy.  RESP  Coarse BS w/ no wheezing no accessory muscle use, no dullness to percussion  CARD:  RRR, no m/r/g  , no peripheral edema, pulses intact, no cyanosis or clubbing.  GI:   Soft & nt; nml bowel sounds; no organomegaly or masses detected.  Musco: Warm bil, no deformities or joint swelling noted.   Neuro: alert, no focal  deficits noted.    Skin: Warm, no lesions or rashes         Assessment & Plan:

## 2011-04-10 NOTE — Telephone Encounter (Signed)
Pt states she is having increased sob, cough, chest tightness and feels aweful. Pt states she needs to be seen today. Offered apt this AM but could not make it. Pt is going to come in and see TP this afternoon at 4:15 to be evaluated

## 2011-04-16 ENCOUNTER — Other Ambulatory Visit: Payer: Medicaid Other

## 2011-04-17 ENCOUNTER — Encounter: Payer: Self-pay | Admitting: *Deleted

## 2011-04-17 NOTE — Progress Notes (Signed)
Quick Note:  ATC both contact numbers for pt. Was told neither number works. Will send results letter to pt's home. ______

## 2011-04-22 ENCOUNTER — Other Ambulatory Visit: Payer: Self-pay | Admitting: Obstetrics

## 2011-04-24 ENCOUNTER — Ambulatory Visit: Payer: Medicaid Other | Admitting: Adult Health

## 2011-04-30 ENCOUNTER — Telehealth: Payer: Self-pay | Admitting: Internal Medicine

## 2011-04-30 NOTE — Telephone Encounter (Signed)
ATC pt's home # and received same message Aundra Millet received earlier.  I also ATC # listed as pt's cell # but this was disconnected.  WCB.

## 2011-04-30 NOTE — Telephone Encounter (Signed)
ATC pt at home # to get answers to MR's questions. However automated machine stated " the quick care customer is unavailable either phone is off or outside service area. Please try your call again later."  No option to leave a message.  WCB

## 2011-04-30 NOTE — Telephone Encounter (Signed)
Need to know where she is going.. And how long is travel ? And at destination will she get back on 4L ?

## 2011-04-30 NOTE — Telephone Encounter (Signed)
Pt called back.  She was very upset because she states she spoke with someone earlier and was told they would call back in 15 minutes.  She could not tell me the name of the person she spoke with just that is was a Estate agent."  I apologized for this and explained to pt that I do not see any documentation so I will just need to clarify message to make sure MR receives the correct message.  She is currently at [facility] and put Elease Hashimoto, who works with [facility], on the phone.  Per Elease Hashimoto, pt is trying to travel.  Pt states she will be leaving Monday.  Elease Hashimoto states they have a travel concentrator they could loan the pt while she is traveling.  However, this only goes up to 3lpm.  Pt is on 4lpm.  Elease Hashimoto states the patient can use this concentrator in the car, with the battery pack, and plug it in at night to use with bipap - however, will only be on 3Lpm not 4 lmp.  Pt requesting to have it approved to travel at 3 lmp for this reason.  I advised Elease Hashimoto MR is seeing pts but will have him address this asap.  We will call back once he addresses it.  Elease Hashimoto will inform pt of this. MR, pls advise if this is ok.  Thanks!

## 2011-05-01 NOTE — Telephone Encounter (Signed)
PATIENT CALLED AGAIN.  SHE INSISTS THAT [facility] NEEDS WRITTEN ORDER FAXED TO THEM.

## 2011-05-01 NOTE — Telephone Encounter (Signed)
ATC pt's home number and msg states customer unavailble, cell number states not valid number, WCB home number later.

## 2011-05-01 NOTE — Telephone Encounter (Signed)
I already faxed order and I have been waiting on call back from [facility] to ensure that this was received and nothing else is needed. I tried calling pt back, and msg states "[facility] customer are unavailable". WCB.

## 2011-05-01 NOTE — Telephone Encounter (Signed)
I spoke with pt. She is upset we have not sent order yet. I explained I was wanting to speak with Gaynelle Adu to just give a verbal.  She states that something still needs to be written. I advised will go ahead and fax order to [facility]- stamped with MR sig and I am going to wait for a call back from [place] to make sure that he received this.

## 2011-05-01 NOTE — Telephone Encounter (Signed)
PATIENT CALLED BACK AND WANTS CALL BACK REGARDING MACHINE.  PLEASE CALL [phone] BY 2 PM OR SHE IS GOING TO CALL BACK AGAIN.

## 2011-05-01 NOTE — Telephone Encounter (Signed)
ATC Leslie Gates and msg states she is out of the office until next wk, so called and LMOVM for Leslie Gates at [facility] so return my call.

## 2011-05-01 NOTE — Telephone Encounter (Signed)
Pt called back.  Best # to reach her at is [phone].  To answer MR's questions:  Pt states she will be traveling by car to [place].  The drive is approx 4 hours.  She is planning to stay x 1 month.  Pt does plan to go back on the 4 LPM once she arrives in [place] but does need the order for 3 LPM while she is in the car traveling.  Will forward message back to [place] to address.

## 2011-05-01 NOTE — Telephone Encounter (Signed)
ATC pt, same msg given, WCB.

## 2011-05-01 NOTE — Telephone Encounter (Signed)
That is fine. Please give order

## 2011-05-04 NOTE — Telephone Encounter (Signed)
Spoke with Will at [facility] to verify that the order faxed to them on 6/15 was received. He states that the order was received and nothing further needed.

## 2011-05-07 ENCOUNTER — Encounter: Payer: Self-pay | Admitting: *Deleted

## 2011-05-07 NOTE — Progress Notes (Signed)
Due to inability to contact the patient both from this office and by Darral Dash, liaison for [facility], a letter was sent by certified mail to pt's home on 6.21.12.  Pt did sign a "contract" on the date the oxygen tank was loaned to her.  Pt was given a copy of this, and the original retained.

## 2011-05-11 ENCOUNTER — Other Ambulatory Visit: Payer: Self-pay | Admitting: Adult Health

## 2011-06-12 ENCOUNTER — Other Ambulatory Visit: Payer: Self-pay | Admitting: Internal Medicine

## 2011-06-15 ENCOUNTER — Other Ambulatory Visit: Payer: Self-pay | Admitting: *Deleted

## 2011-06-15 DIAGNOSIS — I10 Essential (primary) hypertension: Secondary | ICD-10-CM

## 2011-06-15 MED ORDER — MULTIVITAMINS PO TABS: 1.0000 | ORAL_TABLET | Freq: Every day | ORAL | Status: DC

## 2011-06-15 MED ORDER — LISINOPRIL 10 MG PO TABS: 10.0000 mg | ORAL_TABLET | Freq: Every day | ORAL | Status: DC

## 2011-07-27 ENCOUNTER — Ambulatory Visit: Payer: Medicaid Other | Admitting: Internal Medicine

## 2011-07-27 ENCOUNTER — Other Ambulatory Visit (INDEPENDENT_AMBULATORY_CARE_PROVIDER_SITE_OTHER): Payer: Medicaid Other

## 2011-07-27 DIAGNOSIS — B20 Human immunodeficiency virus [HIV] disease: Secondary | ICD-10-CM

## 2011-07-28 LAB — PATHOLOGIST SMEAR REVIEW

## 2011-07-28 LAB — COMPREHENSIVE METABOLIC PANEL
AST: 71 U/L — ABNORMAL HIGH (ref 0–37)
Alkaline Phosphatase: 49 U/L (ref 39–117)
BUN: 10 mg/dL (ref 6–23)
Calcium: 9.4 mg/dL (ref 8.4–10.5)
Chloride: 102 mEq/L (ref 96–112)
Creat: 0.7 mg/dL (ref 0.50–1.10)
Glucose, Bld: 71 mg/dL (ref 70–99)

## 2011-07-28 LAB — T-HELPER CELL (CD4) - (RCID CLINIC ONLY): CD4 % Helper T Cell: 19 % — ABNORMAL LOW (ref 33–55)

## 2011-07-28 LAB — CBC WITH DIFFERENTIAL/PLATELET
Basophils Relative: 1 % (ref 0–1)
Eosinophils Absolute: 0.1 10*3/uL (ref 0.0–0.7)
Eosinophils Relative: 3 % (ref 0–5)
MCH: 27.4 pg (ref 26.0–34.0)
MCHC: 32.3 g/dL (ref 30.0–36.0)
Neutrophils Relative %: 40 % — ABNORMAL LOW (ref 43–77)
Platelets: 85 10*3/uL — ABNORMAL LOW (ref 150–400)

## 2011-07-28 LAB — HIV-1 RNA ULTRAQUANT REFLEX TO GENTYP+: HIV 1 RNA Quant: 14000 copies/mL — ABNORMAL HIGH (ref <20)

## 2011-08-07 ENCOUNTER — Ambulatory Visit: Payer: Medicaid Other | Admitting: Adult Health

## 2011-08-07 LAB — HIV-1 GENOTYPR PLUS

## 2011-08-10 ENCOUNTER — Ambulatory Visit: Payer: Medicaid Other | Admitting: Infectious Disease

## 2011-08-11 LAB — T-HELPER CELL (CD4) - (RCID CLINIC ONLY)
CD4 % Helper T Cell: 20 — ABNORMAL LOW
CD4 T Cell Abs: 290 — ABNORMAL LOW

## 2011-08-12 ENCOUNTER — Ambulatory Visit (INDEPENDENT_AMBULATORY_CARE_PROVIDER_SITE_OTHER): Payer: Medicaid Other | Admitting: Infectious Disease

## 2011-08-12 ENCOUNTER — Encounter: Payer: Self-pay | Admitting: Infectious Disease

## 2011-08-12 VITALS — BP 123/84 | HR 82 | Temp 97.9°F | Wt 185.0 lb

## 2011-08-12 DIAGNOSIS — Z23 Encounter for immunization: Secondary | ICD-10-CM

## 2011-08-12 DIAGNOSIS — I2789 Other specified pulmonary heart diseases: Secondary | ICD-10-CM

## 2011-08-12 DIAGNOSIS — N879 Dysplasia of cervix uteri, unspecified: Secondary | ICD-10-CM

## 2011-08-12 DIAGNOSIS — R21 Rash and other nonspecific skin eruption: Secondary | ICD-10-CM

## 2011-08-12 DIAGNOSIS — B20 Human immunodeficiency virus [HIV] disease: Secondary | ICD-10-CM

## 2011-08-12 MED ORDER — SULFAMETHOXAZOLE-TRIMETHOPRIM 800-160 MG PO TABS: 1.0000 | ORAL_TABLET | ORAL | Status: DC

## 2011-08-12 MED ORDER — TRIAMCINOLONE ACETONIDE 0.5 % EX CREA: TOPICAL_CREAM | Freq: Two times a day (BID) | CUTANEOUS | Status: DC

## 2011-08-12 MED ORDER — EMTRICITABINE-TENOFOVIR DF 200-300 MG PO TABS: 1.0000 | ORAL_TABLET | Freq: Every day | ORAL | Status: DC

## 2011-08-12 MED ORDER — ETRAVIRINE 200 MG PO TABS: 400.0000 mg | ORAL_TABLET | Freq: Every day | ORAL | Status: DC

## 2011-08-12 NOTE — Assessment & Plan Note (Signed)
Will try topical triamcinolone

## 2011-08-12 NOTE — Assessment & Plan Note (Signed)
Needs close followup with Gyn

## 2011-08-12 NOTE — Progress Notes (Signed)
Subjective:    Patient ID: Leslie Gates, female    DOB: [birth year], 45 y.o.   MRN: [id number]  HPI 45 year old lady with HIV who has been noncompliant with antivirals returns for followup. She just received her antivirals and on for 2 days when she starters protrusion or branch transient. Her viral load and he has been high. She claimed that she did not receive antiviral and that they were never sent to her or prescribed. We spent greater than 45 minutes with the patient including greater than 50% of time counseling the patient face-to-face including time spent discussing discussing various antiviral regimens and incorporate in her care. She voiced to me concerns about swallowing pills and this appears apparently has been an obstacle to her taking antiretrovirals in the past. Through reviewing her past genotype to see that she had a K103 but not a specific mutation and understood the heart may be deemed. I therefore proposed the possibility of putting her on once daily Intelence at a dose of 400 mg daily all the drink along with crush Truvada once daily. She complains of problems with pruritic rash on arms and legs taht she attributes to mosquitoe bites. Review of Systems  Constitutional: Negative for fever, chills, diaphoresis, activity change, appetite change, fatigue and unexpected weight change.  HENT: Negative for congestion, sore throat, rhinorrhea, sneezing, trouble swallowing and sinus pressure.   Eyes: Negative for photophobia and visual disturbance.  Respiratory: Positive for shortness of breath. Negative for cough, chest tightness, wheezing and stridor.   Cardiovascular: Negative for chest pain, palpitations and leg swelling.  Gastrointestinal: Negative for nausea, vomiting, abdominal pain, diarrhea, constipation, blood in stool, abdominal distention and anal bleeding.  Genitourinary: Negative for dysuria, hematuria, flank pain and difficulty urinating.  Musculoskeletal: Negative for  myalgias, back pain, joint swelling, arthralgias and gait problem.  Skin: Positive for rash. Negative for color change, pallor and wound.  Neurological: Negative for dizziness, tremors, weakness and light-headedness.  Hematological: Negative for adenopathy. Does not bruise/bleed easily.  Psychiatric/Behavioral: Negative for behavioral problems, confusion, sleep disturbance, dysphoric mood, decreased concentration and agitation.       Objective:   Physical Exam  Constitutional: She is oriented to person, place, and time. She appears well-developed and well-nourished. No distress.  HENT:  Head: Normocephalic and atraumatic.  Mouth/Throat: Oropharynx is clear and moist. No oropharyngeal exudate.  Eyes: Conjunctivae and EOM are normal. Pupils are equal, round, and reactive to light. No scleral icterus.  Neck: Normal range of motion. Neck supple. No JVD present.  Cardiovascular: Normal rate, regular rhythm and normal heart sounds.  Exam reveals no gallop and no friction rub.   No murmur heard. Pulmonary/Chest: Effort normal and breath sounds normal. No respiratory distress. She has no wheezes. She has no rales. She exhibits no tenderness.       Prolonged expiratory phase  Abdominal: She exhibits no distension and no mass. There is no tenderness. There is no rebound and no guarding.  Musculoskeletal: She exhibits no edema and no tenderness.  Lymphadenopathy:    She has no cervical adenopathy.  Neurological: She is alert and oriented to person, place, and time. She has normal reflexes. She exhibits normal muscle tone. Coordination normal.  Skin: Skin is warm and dry. She is not diaphoretic. No erythema. No pallor.     Psychiatric: She has a normal mood and affect. Her behavior is normal. Judgment and thought content normal.          Assessment & Plan:  HIV DISEASE Try once daily intelence with truvada (former dissolved in drink, latter crushed in apple sauce) Was clear that if she was  not highly adherent to this regimen would have to go back to a PI based regimen. LIquid kaletra with crushed truvada should be something she could swallow though it will not taste very good. I will put her on three times weeekly bactrim  HYPERTENSION, PULMONARY Being followed by Dr. Marchelle Gearing. She is on chronic O2 though she stopped it during visit to preserve her portable tank then restarted it)  DYSPLASIA OF CERVIX UNSPECIFIED Needs close followup with Gyn  Rash Will try topical triamcinolone

## 2011-08-12 NOTE — Assessment & Plan Note (Signed)
Try once daily intelence with truvada (former dissolved in drink, latter crushed in apple sauce) Was clear that if she was not highly adherent to this regimen would have to go back to a PI based regimen. LIquid kaletra with crushed truvada should be something she could swallow though it will not taste very good. I will put her on three times weeekly bactrim

## 2011-08-12 NOTE — Patient Instructions (Signed)
I HAVE PRESCRIBED A NEW REGIMEN FOR YOU  INTELENCE 200MG  TABLET: TAKE TWO TABLETS (400MG ) AND DISSOLVE IN GLASS WITH DRINK OF YOUR CHOOSING DRINK DISSOLVED PILLS AND DRINK REFILL THE EMPTY GLASS WITH DRINK AND WASH DOWN THE REMAINING PARTICLES OF PILLS  DO THIS ONCE DAILY  TAKE ONE TABLET OF TRUVADA AND CRUSH IT, MIX WITH APPLESAUCE AND SWALLOW IT ONCE DAILY  PLEASE TAKE THESE MEDICATIONS EVERY SINGLE DAY WITHOUT FAIL!!  I AM ALSO PRESCRIBING YOU BACTRIM TO BE TAKEN THREE TIMES WEEKLY TO PREVENT PNEUMONIA

## 2011-08-12 NOTE — Assessment & Plan Note (Signed)
Being followed by Dr. Marchelle Gearing. She is on chronic O2 though she stopped it during visit to preserve her portable tank then restarted it)

## 2011-08-13 ENCOUNTER — Ambulatory Visit: Payer: Medicaid Other | Admitting: Internal Medicine

## 2011-08-13 LAB — T-HELPER CELL (CD4) - (RCID CLINIC ONLY): CD4 % Helper T Cell: 23 — ABNORMAL LOW

## 2011-08-18 LAB — T-HELPER CELL (CD4) - (RCID CLINIC ONLY)
CD4 % Helper T Cell: 22 — ABNORMAL LOW
CD4 T Cell Abs: 280 — ABNORMAL LOW

## 2011-08-25 ENCOUNTER — Emergency Department (HOSPITAL_COMMUNITY)
Admission: EM | Admit: 2011-08-25 | Discharge: 2011-08-25 | Disposition: A | Discharge status: Home / Self Care | Payer: Medicaid Other | Attending: Emergency Medicine | Admitting: Emergency Medicine

## 2011-08-25 ENCOUNTER — Other Ambulatory Visit: Payer: Self-pay | Admitting: *Deleted

## 2011-08-25 DIAGNOSIS — Z21 Asymptomatic human immunodeficiency virus [HIV] infection status: Secondary | ICD-10-CM | POA: Insufficient documentation

## 2011-08-25 DIAGNOSIS — J45909 Unspecified asthma, uncomplicated: Secondary | ICD-10-CM | POA: Insufficient documentation

## 2011-08-25 DIAGNOSIS — I10 Essential (primary) hypertension: Secondary | ICD-10-CM | POA: Insufficient documentation

## 2011-08-25 DIAGNOSIS — Z79899 Other long term (current) drug therapy: Secondary | ICD-10-CM | POA: Insufficient documentation

## 2011-08-25 DIAGNOSIS — T46905A Adverse effect of unspecified agents primarily affecting the cardiovascular system, initial encounter: Secondary | ICD-10-CM | POA: Insufficient documentation

## 2011-08-25 DIAGNOSIS — T783XXA Angioneurotic edema, initial encounter: Secondary | ICD-10-CM | POA: Insufficient documentation

## 2011-08-25 LAB — D-DIMER, QUANTITATIVE: D-Dimer, Quant: 1.01 — ABNORMAL HIGH

## 2011-08-25 LAB — CBC
MCHC: 33.2
RBC: 4.3
RDW: 12.9

## 2011-08-25 LAB — POCT CARDIAC MARKERS
CKMB, poc: 2.8
Myoglobin, poc: 122
Operator id: 284251
Troponin i, poc: <0.05

## 2011-08-25 LAB — I-STAT 8, (EC8 V) (CONVERTED LAB)
Acid-Base Excess: 4 — ABNORMAL HIGH
HCT: 41
Hemoglobin: 13.9
Potassium: 4
Sodium: 139
TCO2: 30

## 2011-08-25 LAB — DIFFERENTIAL
Basophils Absolute: 0
Basophils Relative: 1
Lymphocytes Relative: 31
Neutro Abs: 2.3
Neutrophils Relative %: 59

## 2011-08-25 NOTE — Telephone Encounter (Signed)
[facility] patients lisinopril due to he allergic reaction she had. She went to the ED this morning with a very swollen lower lip.

## 2011-08-26 ENCOUNTER — Ambulatory Visit: Payer: Medicaid Other | Admitting: Internal Medicine

## 2011-08-27 ENCOUNTER — Encounter: Payer: Self-pay | Admitting: Internal Medicine

## 2011-08-27 ENCOUNTER — Ambulatory Visit (INDEPENDENT_AMBULATORY_CARE_PROVIDER_SITE_OTHER): Payer: Medicaid Other | Admitting: Internal Medicine

## 2011-08-27 VITALS — BP 118/82 | HR 64 | Temp 98.0°F | Ht 62.0 in | Wt 189.0 lb

## 2011-08-27 DIAGNOSIS — I2789 Other specified pulmonary heart diseases: Secondary | ICD-10-CM

## 2011-08-27 NOTE — Patient Instructions (Signed)
Glad you are doing better Keep up with diet control and weight loss Continue with CPAP We will refer you back to Dr. Teressa Lower of cardiology for possible reassessment of pulmonary artery pressures Return to see me in 3 months

## 2011-08-27 NOTE — Assessment & Plan Note (Signed)
Clinically since last visit and this past year she has done all the right things asked of her to improved secondary issues with pulmonary hypertension (the venous side). She is on calcium channel blocker. She is on HIV ART Rx. She has had intentional weihgt loss of 30#. She is compliant iwht CPAP. She did not desaturate on exertrion 185 feet x  3 laps but has some residual class 2 dyspnea. So, it is likely time to reassess pulmonary hypertension. I wil have her see Dr. Teressa Lower for consideration of repeat right heart cath

## 2011-08-27 NOTE — Progress Notes (Signed)
Subjective:    Patient ID: Leslie Gates, female    DOB: 1966-05-06, 45 y.o.   MRN: [id number]  HPI Obese BMI 33 AA female, with HIV since 2000 (following case manager Davina Poke ), OSA since 2003 with CPAP noncompliance. Has Cass 3 dyspnea with desaturation at 185 feet of walkingon room air in July 2011. ECHO June 2011 with Grade 2 diast dysfunction. PFTs Spirometry June 2011 suggestive of restriction, CT Chest June 201 without evidence of PE or parencymal disease. ABG on room air from May 13, 2010: pH 7.42, PaCO2 56, PaO2 59.   OV 07/01/2010: She is following up after visiting Dr. Craige Cotta on 06/27/2010 for OSA eval; a sleep study is pending. She also had Rt heart cath on 06/23/2010. PA meanis 60, PCWP is 25-30, TRANPULM GRADIENT - 30, CI is 2.3L, and PVR is 7.1 Woods Unit. Spironlactone was started. AFter this dyspnea and edema has improved. Case Manager states she is compliant with all meds. No new complaints. She is now motivated to lose weight and get better. DR Bensimohn is recommending adcirca for diastolic dysnf related Pulmnary hypertension. REC: a) WEIGHT LOSS; b) SLEEP APNEA OPTIMIZATION c) AFTER ABOVE ASSESS for [facility] RX   October 15, 2010: Followup Pulmnary Hypertenstion. Since last visit I note she attended xtreme makeover class once in Sept 2011. Despite this has actually gained 10# weight. BMI is now > 36. DR Craige Cotta has noticed that CPAP was not working and is in process of setting auto BIPAP 25-5 for her. This has not been set up and she is waiting for machine. IF this fails, he is considering TRACH or SERVO vent. SHe tells me thta dyspnea and edema are better although we note she is now needing more O2. Denies fever, cough, sputum or any illness   November 24, 2010: Followup for pulm htn. Last seen Nov 2011. In interim, has been able to lose 10# weight due to dietary changes and motoivation. Complaint with all meds. Yet to start lasix. Several weeks ago developed bilateral arm tingling  and saw Dr. Teressa Lower on 11/12/2010 - nuclear medicine stress test is pending. Otherwise, no new problems. Still with class 3 dyspnea. Desaturated to 88% after walking 155 feet x 2 laps but not using o2 at night. Compliant with bipap at night.    04/10/11 Acute OV  Pt presents for an acute office visit. Complains of prod cough with yellow mucus, wheezing, increased SOB x2days. She did not wear her O2 to the office today. Sats were low on arrival at 76% , she was placed on O2 at 4 l/m at 94%. She says she has not been taking her antivirals meds for her HIV due to diarrhea. Says Dr. Ninetta Lights is aware however on review did not see that in the last ID clinic notes.   Says she is has good appetite w/ no n/v. Says she was doing good and has been wearing her O2 at home at all times. At home she wears O2 at 4 l/m continuous. Says she uses her BIPAP each night.   No leg swelling or chest pain.  I have recommended hospitlization today however she declines.   Avelox 400mg  daily for 7 days--samples given  Mucinex DM Twice daily As needed Cough/congestion  Fluids and rest.  I will call with xray results.  Please contact office for sooner follow up if symptoms do not improve or worsen or seek emergency care  follow up 2 weeks Dr. Marchelle Gearing and As needed  OV 08/27/11:  Dyspnea improved significantly. Rates it as mild. Exertional when she climbs steps. Associated 30# intentional wiehgt loss of obesity present due to improved diet control (BMI now 34.6). Compliant with HIV ART Rx and CPAP for OSA. Feels the best ever in a long time. Walked her on room air 185 feet x 3 laps and did not desaturate . She has not seen Dr Teressa Lower of cards in > 9 months about her pulmonary hypertension. No repeeat right heart cath since August 2011. Not on advanced PAH Rx.  Denies associated edema, chest pain, syncope, fever, chills, nausea, vomit diarrhea. Has had flu shot  Past, Family, Social: reviewed. No changes noted    Review  of Systems  Constitutional: Negative for fever and unexpected weight change.  HENT: Negative for ear pain, nosebleeds, congestion, sore throat, rhinorrhea, sneezing, trouble swallowing, dental problem, postnasal drip and sinus pressure.   Eyes: Negative for redness and itching.  Respiratory: Negative for cough, chest tightness, shortness of breath and wheezing.   Cardiovascular: Negative for palpitations and leg swelling.  Gastrointestinal: Negative for nausea and vomiting.  Genitourinary: Negative for dysuria.  Musculoskeletal: Negative for joint swelling.  Skin: Negative for rash.  Neurological: Negative for headaches.  Hematological: Does not bruise/bleed easily.  Psychiatric/Behavioral: Negative for dysphoric mood. The patient is not nervous/anxious.        Objective:   Physical Exam  GEN: A/Ox3; pleasant , NAD, obese   HEENT:  [place]/AT,  EACs-clear, TMs-wnl, NOSE-clear, THROAT-clear, no lesions, no postnasal drip or exudate noted.   NECK:  Supple w/ fair ROM; no JVD; normal carotid impulses w/o bruits; no thyromegaly or nodules palpated; no lymphadenopathy.  RESP  Coarse BS w/ no wheezing no accessory muscle use, no dullness to percussion  CARD:  RRR, no m/r/g  , no peripheral edema, pulses intact, no cyanosis or clubbing.  GI:   Soft & nt; nml bowel sounds; no organomegaly or masses detected.  Musco: Warm bil, no deformities or joint swelling noted.   Neuro: alert, no focal deficits noted.    Skin: Warm, no lesions or rashes       Assessment & Plan:

## 2011-09-09 ENCOUNTER — Encounter: Payer: Self-pay | Admitting: Infectious Disease

## 2011-09-09 ENCOUNTER — Ambulatory Visit (INDEPENDENT_AMBULATORY_CARE_PROVIDER_SITE_OTHER): Payer: Medicaid Other | Admitting: Infectious Disease

## 2011-09-09 VITALS — BP 118/77 | HR 80 | Temp 98.3°F | Wt 184.0 lb

## 2011-09-09 DIAGNOSIS — R21 Rash and other nonspecific skin eruption: Secondary | ICD-10-CM

## 2011-09-09 DIAGNOSIS — Z87898 Personal history of other specified conditions: Secondary | ICD-10-CM | POA: Insufficient documentation

## 2011-09-09 DIAGNOSIS — T783XXA Angioneurotic edema, initial encounter: Secondary | ICD-10-CM

## 2011-09-09 DIAGNOSIS — B20 Human immunodeficiency virus [HIV] disease: Secondary | ICD-10-CM

## 2011-09-09 DIAGNOSIS — I10 Essential (primary) hypertension: Secondary | ICD-10-CM

## 2011-09-09 NOTE — Assessment & Plan Note (Signed)
Secondary to ACEI, resolving

## 2011-09-09 NOTE — Assessment & Plan Note (Signed)
bp controlled   °

## 2011-09-09 NOTE — Assessment & Plan Note (Signed)
stable °

## 2011-09-09 NOTE — Assessment & Plan Note (Signed)
Continue etravirine 400mg  daily and truvada daily,

## 2011-09-09 NOTE — Progress Notes (Signed)
  Subjective:    Patient ID: Leslie Gates, female    DOB: 11-13-1966, 45 y.o.   MRN: [id number]  HPI  45 year old African American  lady with HIV who has been noncompliant with antiviral whom I saw just 2-3 weeks ago. At that time I encourage her to be on once daily dissolved in a drink  etravirine and crushed truvada, she claims that she has been taking this since then. She did develop an allergic reaction apparently to the lisinopril was started for her blood pressure and she is in the emergency department. This has resolved since then. She does have some nausea she attributes to the medications although this is become tolerable. She has not been vomiting. She has no other new complaints today. Review of Systems  Constitutional: Negative for fever, chills, diaphoresis, activity change, appetite change, fatigue and unexpected weight change.  HENT: Negative for congestion, sore throat, rhinorrhea, sneezing, trouble swallowing and sinus pressure.   Eyes: Negative for photophobia and visual disturbance.  Respiratory: Positive for cough and shortness of breath. Negative for chest tightness, wheezing and stridor.   Cardiovascular: Negative for chest pain, palpitations and leg swelling.  Gastrointestinal: Negative for nausea, vomiting, abdominal pain, diarrhea, constipation, blood in stool, abdominal distention and anal bleeding.  Genitourinary: Negative for dysuria, hematuria, flank pain and difficulty urinating.  Musculoskeletal: Negative for myalgias, back pain, joint swelling, arthralgias and gait problem.  Skin: Positive for rash. Negative for color change, pallor and wound.  Neurological: Negative for dizziness, tremors, weakness and light-headedness.  Hematological: Negative for adenopathy. Does not bruise/bleed easily.  Psychiatric/Behavioral: Negative for behavioral problems, confusion, sleep disturbance, dysphoric mood, decreased concentration and agitation.       Objective:   Physical  Exam  Constitutional: She is oriented to person, place, and time. She appears well-developed and well-nourished. No distress.  HENT:  Head: Normocephalic and atraumatic.  Mouth/Throat: Oropharynx is clear and moist. No oropharyngeal exudate.  Eyes: Conjunctivae and EOM are normal. Pupils are equal, round, and reactive to light. No scleral icterus.  Neck: Normal range of motion. Neck supple. No JVD present.  Cardiovascular: Normal rate, regular rhythm and normal heart sounds.  Exam reveals no gallop and no friction rub.   No murmur heard. Pulmonary/Chest: Effort normal. No respiratory distress. She has no wheezes. She has rales. She exhibits no tenderness.  Abdominal: She exhibits no distension and no mass. There is no tenderness. There is no rebound and no guarding.  Musculoskeletal: She exhibits no edema and no tenderness.  Lymphadenopathy:    She has no cervical adenopathy.  Neurological: She is alert and oriented to person, place, and time. She has normal reflexes. She exhibits normal muscle tone. Coordination normal.  Skin: Skin is warm and dry. She is not diaphoretic. No erythema. No pallor.     Psychiatric: She has a normal mood and affect. Her behavior is normal. Judgment and thought content normal.          Assessment & Plan:  HIV DISEASE Continue etravirine 400mg  daily and truvada daily,   HYPERTENSION bp controlled  Rash stable  Angioedema of lips Secondary to ACEI, resolving

## 2011-09-11 LAB — HIV-1 RNA QUANT-NO REFLEX-BLD: HIV-1 RNA Quant, Log: 2.26 {Log} — ABNORMAL HIGH (ref <1.30)

## 2011-09-14 ENCOUNTER — Telehealth: Payer: Self-pay | Admitting: Internal Medicine

## 2011-09-14 ENCOUNTER — Encounter: Payer: Self-pay | Admitting: Internal Medicine

## 2011-09-14 NOTE — Telephone Encounter (Signed)
Jen  Please ensure fu wit Dr Teressa Lower for repeat right heart cath. FU with me after that.  Thanks  MR

## 2011-09-17 NOTE — Telephone Encounter (Signed)
Appt is scheduled for 09-24-11 with Dr. Teressa Lower. I will check to see if pt keeps appt.  Carron Curie, CMA

## 2011-09-24 ENCOUNTER — Ambulatory Visit (HOSPITAL_COMMUNITY)
Admission: RE | Admit: 2011-09-24 | Discharge: 2011-09-24 | Disposition: A | Discharge status: Home / Self Care | Payer: Medicaid Other | Source: Ambulatory Visit | Attending: Internal Medicine | Admitting: Internal Medicine

## 2011-09-24 VITALS — BP 112/78 | HR 75 | Wt 183.2 lb

## 2011-09-24 DIAGNOSIS — G4733 Obstructive sleep apnea (adult) (pediatric): Secondary | ICD-10-CM

## 2011-09-24 DIAGNOSIS — I2789 Other specified pulmonary heart diseases: Secondary | ICD-10-CM | POA: Insufficient documentation

## 2011-09-24 DIAGNOSIS — I10 Essential (primary) hypertension: Secondary | ICD-10-CM

## 2011-09-24 NOTE — Assessment & Plan Note (Signed)
Compliant with meddications.

## 2011-09-24 NOTE — Assessment & Plan Note (Signed)
Stable.  Continue current treatment

## 2011-09-24 NOTE — Assessment & Plan Note (Addendum)
She is currently doing well, volume status looks good.  NYHA II.  Will continue current medications.  Will reassess RV function with echocardiogram initially.  If RV improved would continue current treatment as functional status has improved.  If evidence of persistent RV strain would proceed with RHC for further assessment.  Patient is agreeable to this plan.  Have encouraged her to continue walking and watching diet.    Patient seen and examined with Ulyess Blossom PA-C. We discussed all aspects of the encounter. I agree with the assessment and plan as stated above.

## 2011-09-24 NOTE — Progress Notes (Signed)
HPI:  [name] is a 45 year old woman with history of obesity, hypertension, HIV, and obstructive sleep apnea who was found to have pulmonary hypertension with RV dysfunction on echocardiogram.    Dr. Gala Romney perfromed a RHC on August 2011.  RA 21, RV pressure 78/39/21.  Pulmonary artery pressure 84/44 (60). PCWP 25-30.  Fick  4.2 L/2.3 Pulmonary vascular resistance was 7.1 Woods units.  The patient had evidence of Cheyne-Stokes breathing during the procedure secondary to mild sedation.  She was started on spiro for her elevated L heart pressures but we also suggested the possibility of Adcirca as a pulmonary vasodilator.   She had a nuclear stress in January 2012 that was normal with EF 68%.  She has been followed by Dr. Marchelle Gearing for OSA and is wearing her CPAP nightly and O2 during the day.  She has lost about 30 pounds since last year.  She has not followed up with Dr. Gala Romney in almost a year.  She recently followed up with Dr. Marchelle Gearing and functionally she is improving but still with Class II symptoms and felt it was time for reassessment of her pulmonary HTN.  He walked her 185 ft x 3 laps without desaturation.   She returns for follow up today.  She says she feels good.  Her dyspnea has improved greatly, she credits this to pulmonary rehab.  She is dyspneic with climbing stairs but says she has improved from only being able to walk 2 blocks to currently walking 5 without distress.  She denies lower extremity edema.  No orthopnea or PND but is on CPAP at night.  She is watching her diet closely.  States medication compliance.  She has occasional ache in her chest that is resolved with inhaler use.  No fever, chills, or presyncope/syncope.     ROS: All other systems normal except as mentioned in HPI, past medical history and problem list.    Past Medical History  Diagnosis Date  . Pulmonary HTN   . OSA (obstructive sleep apnea)   . Diastolic CHF, chronic   . HTN (hypertension)     . Human immunodeficiency virus (HIV) 2001  . Wrist fracture, bilateral   . History of herpes zoster   . History of cervical dysplasia   . Dysphagia   . Chronic otitis media   . Allergic rhinitis   . Obesity     Current Outpatient Prescriptions  Medication Sig Dispense Refill  . albuterol (PROVENTIL HFA;VENTOLIN HFA) 108 (90 BASE) MCG/ACT inhaler Inhale 2 puffs into the lungs every 6 (six) hours as needed.        Marland Kitchen amLODipine (NORVASC) 10 MG tablet Take 10 mg by mouth daily.        . cetirizine (ZYRTEC) 10 MG tablet TAKE ONE TABLET BY MOUTH DAILY  30 tablet  2  . emtricitabine-tenofovir (TRUVADA) 200-300 MG per tablet Take 2 tablets by mouth daily.        . furosemide (LASIX) 20 MG tablet Take 20 mg by mouth 2 (two) times daily.        Marland Kitchen loratadine (CLARITIN) 10 MG tablet Take 10 mg by mouth daily.        . Multiple Vitamin (MULTIVITAMIN) tablet TAKE ONE TABLET BY MOUTH DAILY  30 tablet  12  . naphazoline-pheniramine (NAPHCON-A) 0.025-0.3 % ophthalmic solution Place 1 drop into both eyes 4 (four) times daily as needed.        Marland Kitchen spironolactone (ALDACTONE) 25 MG tablet Take 25 mg by mouth  daily.           Allergies  Allergen Reactions  . Lisinopril Swelling    Very swollen bottom lip    History   Social History  . Marital Status: Single    Spouse Name: N/A    Number of Children: 2  . Years of Education: N/A   Occupational History  .      unemployed   Social History Main Topics  . Smoking status: Never Smoker   . Smokeless tobacco: Never Used  . Alcohol Use: No  . Drug Use: No  . Sexually Active: Not Currently   Other Topics Concern  . Not on file   Social History Narrative  . No narrative on file    Family History  Problem Relation Age of Onset  . Other Mother 77    ESRD  . Other Father     GSW  . Hypertension      PHYSICAL EXAM: Filed Vitals:   09/24/11 0938  BP: 112/78  Pulse: 75  Wt 183  General:  Well appearing. No respiratory  difficulty HEENT: normal Neck: supple. JVP 6-7. Carotids 2+ bilat; no bruits. No lymphadenopathy or thryomegaly appreciated. Cor: PMI nondisplaced. Regular rate & rhythm. No rubs, gallops or murmurs. Lungs: clear Abdomen: obese, soft, nontender, nondistended. No hepatosplenomegaly. No bruits or masses. Good bowel sounds. Extremities: no cyanosis, clubbing, rash, edema Neuro: alert & oriented x 3, cranial nerves grossly intact. moves all 4 extremities w/o difficulty. Affect pleasant.    ASSESSMENT & PLAN:

## 2011-09-24 NOTE — Assessment & Plan Note (Signed)
Stable.  Continue CPAP usage.

## 2011-09-24 NOTE — Patient Instructions (Signed)
Continue current medications.  Keep watching working on diet and exercise, you are doing great!  Your physician has requested that you have an echocardiogram. Echocardiography is a painless test that uses sound waves to create images of your heart. It provides your doctor with information about the size and shape of your heart and how well your heart's chambers and valves are working. This procedure takes approximately one hour. There are no restrictions for this procedure.  Follow up with Dr. Gala Romney in 3 months.

## 2011-09-28 NOTE — Telephone Encounter (Signed)
Pt saw Robbi Garter, PA at heart failure clinic on 09-24-11. Carron Curie, CMA

## 2011-10-08 NOTE — Progress Notes (Signed)
Patient seen and examined with Nicki Bradley PA-C. We discussed all aspects of the encounter. I agree with the assessment and plan as stated above.   

## 2011-10-14 ENCOUNTER — Ambulatory Visit: Payer: Medicaid Other | Admitting: Infectious Disease

## 2011-10-14 ENCOUNTER — Telehealth: Payer: Self-pay | Admitting: Licensed Clinical Social Worker

## 2011-10-14 NOTE — Telephone Encounter (Signed)
Called and left message for patient about missed appointment today. Left [facility] number and told her to call and reschedule. Starleen Arms

## 2011-10-15 ENCOUNTER — Ambulatory Visit (HOSPITAL_COMMUNITY)
Admission: RE | Admit: 2011-10-15 | Discharge: 2011-10-15 | Disposition: A | Discharge status: Home / Self Care | Payer: Medicaid Other | Source: Ambulatory Visit | Attending: Physician Assistant | Admitting: Physician Assistant

## 2011-10-15 DIAGNOSIS — I2789 Other specified pulmonary heart diseases: Secondary | ICD-10-CM | POA: Insufficient documentation

## 2011-10-15 DIAGNOSIS — E785 Hyperlipidemia, unspecified: Secondary | ICD-10-CM | POA: Insufficient documentation

## 2011-10-15 DIAGNOSIS — I270 Primary pulmonary hypertension: Secondary | ICD-10-CM | POA: Insufficient documentation

## 2011-10-15 DIAGNOSIS — I10 Essential (primary) hypertension: Secondary | ICD-10-CM | POA: Insufficient documentation

## 2011-10-15 NOTE — Progress Notes (Addendum)
  Echocardiogram 2D Echocardiogram has been performed.  Dewitt Hoes, RDCS 10/15/2011, 10:26 AM

## 2011-10-22 ENCOUNTER — Other Ambulatory Visit: Payer: Self-pay | Admitting: *Deleted

## 2011-10-22 DIAGNOSIS — B20 Human immunodeficiency virus [HIV] disease: Secondary | ICD-10-CM

## 2011-10-22 DIAGNOSIS — J302 Other seasonal allergic rhinitis: Secondary | ICD-10-CM

## 2011-10-22 MED ORDER — LORATADINE 10 MG PO TABS: 10.0000 mg | ORAL_TABLET | Freq: Every day | ORAL | Status: DC

## 2011-10-22 MED ORDER — EMTRICITABINE-TENOFOVIR DF 200-300 MG PO TABS: 1.0000 | ORAL_TABLET | Freq: Every day | ORAL | Status: DC

## 2011-11-25 ENCOUNTER — Other Ambulatory Visit: Payer: Self-pay | Admitting: Internal Medicine

## 2011-11-25 DIAGNOSIS — Z1231 Encounter for screening mammogram for malignant neoplasm of breast: Secondary | ICD-10-CM

## 2011-12-08 ENCOUNTER — Other Ambulatory Visit: Payer: Self-pay | Admitting: Internal Medicine

## 2011-12-08 MED ORDER — AMLODIPINE BESYLATE 10 MG PO TABS: 10.0000 mg | ORAL_TABLET | Freq: Every day | ORAL | Status: DC

## 2011-12-09 ENCOUNTER — Other Ambulatory Visit: Payer: Self-pay | Admitting: Allergy

## 2011-12-09 MED ORDER — ALBUTEROL SULFATE HFA 108 (90 BASE) MCG/ACT IN AERS: 1.0000 | INHALATION_SPRAY | Freq: Four times a day (QID) | RESPIRATORY_TRACT | Status: DC | PRN

## 2011-12-09 NOTE — Telephone Encounter (Signed)
[facility] REQUESTING VENTOLIN 1 PUFF Q6 HOURS PRN

## 2011-12-11 ENCOUNTER — Ambulatory Visit: Payer: Medicaid Other

## 2011-12-21 ENCOUNTER — Ambulatory Visit: Payer: Medicaid Other

## 2011-12-25 ENCOUNTER — Encounter (HOSPITAL_COMMUNITY): Payer: Medicaid Other

## 2011-12-28 NOTE — Assessment & Plan Note (Signed)
The timing of her symptoms seems to mostly, associate with early morning hours following the use of the CPAP overnight. Some measures to improve may include having the CPAP evaluated to improve humidification and change. The filter. Additionally, we should recommend the following to help with some of her symptoms: Guaifenesin/Dextromethorphan15 mL every 4 hours when necessary  Ibuprofen. 600 mg every 6-8 hours when necessary , fever, and discomfort  Diphenhydramine.25-50 mg by mouth each bedtime when necessary  Cetirizine 10 mg by mouth every morning Increase fluid intake. Proper nutrition with a least 3 meals a day. Contact clinic or go to urgent care. If symptoms worsen over the next 5 days or do not improve in the next 7 days.

## 2011-12-28 NOTE — Assessment & Plan Note (Signed)
Clinically stable on current regimen. Continue present management. She is scheduled for labs today. Counseling provided on prevention of transmission of HIV. Condoms offered:  Refused Medication adherence discussed with patient. Referrals: None Follow up visit in 3 months with labs 2 weeks prior to appointment. Patient verbally acknowledged information provided to them and agreed with plan of care.

## 2011-12-28 NOTE — Progress Notes (Signed)
Subjective:    Patient ID: Leslie Gates is a 46 y.o. female.  Chief Complaint: HIV Follow-up Visit Leslie Gates is here for follow-up of HIV infection. She is feeling worse since her last visit.  She claims continued adherence to therapy with good tolerance and no complications. There are additional complaints. Continuously complains of itchy, watery eyes, chronic cough, and some shortness of breath, especially in the morning after using CPAP machine at nighttime. She claims he is adherent with her CPAP. She also relates that the symptoms do improve as the day goes by. Denies, fevers, chills, sweats.  Data Review: Diagnostic studies reviewed.  Review of Systems - General ROS: positive for  - fatigue and malaise Psychological ROS: negative Ophthalmic ROS: negative ENT ROS: negative Breast ROS: negative for breast lumps Respiratory ROS: positive for - cough and orthopnea Cardiovascular ROS: no chest pain or dyspnea on exertion Gastrointestinal ROS: no abdominal pain, change in bowel habits, or black or bloody stools Musculoskeletal ROS: negative Neurological ROS: no TIA or stroke symptoms  Objective:   General appearance: alert, cooperative and morbidly obese Head: Normocephalic, without obvious abnormality, atraumatic Eyes: positive findings: sclera Red with excessive tearing Ears: normal TM's and external ear canals both ears Throat: lips, mucosa, and tongue normal; teeth and gums normal Resp: wheezes RUL Cardio: regular rate and rhythm, S1, S2 normal, no murmur, click, rub or gallop GI: soft, non-tender; bowel sounds normal; no masses,  no organomegaly Pulses: 2+ and symmetric Neurologic: Grossly normal Psych:  No vegetative signs or delusional behaviors noted.    Laboratory: From 10/30/2010 ,  CD4 count was 310 c/cmm @ 24 %. Viral load 136 copies/ml.     Assessment/Plan:   HIV DISEASE Clinically stable on current regimen. Continue present management. She is  scheduled for labs today. Counseling provided on prevention of transmission of HIV. Condoms offered:  Refused Medication adherence discussed with patient. Referrals: None Follow up visit in 3 months with labs 2 weeks prior to appointment. Patient verbally acknowledged information provided to them and agreed with plan of care.   ALLERGIC RHINITIS CAUSE UNSPECIFIED The timing of her symptoms seems to mostly, associate with early morning hours following the use of the CPAP overnight. Some measures to improve may include having the CPAP evaluated to improve humidification and change. The filter. Additionally, we should recommend the following to help with some of her symptoms: Guaifenesin/Dextromethorphan15 mL every 4 hours when necessary  Ibuprofen. 600 mg every 6-8 hours when necessary , fever, and discomfort  Diphenhydramine.25-50 mg by mouth each bedtime when necessary  Cetirizine 10 mg by mouth every morning Increase fluid intake. Proper nutrition with a least 3 meals a day. Contact clinic or go to urgent care. If symptoms worsen over the next 5 days or do not improve in the next 7 days.      Leslie Gates A. Sundra Aland, MS, [facility] [phone]  12/28/2011, 1:00 PM

## 2011-12-29 ENCOUNTER — Other Ambulatory Visit: Payer: Self-pay | Admitting: Infectious Disease

## 2012-01-22 ENCOUNTER — Other Ambulatory Visit (HOSPITAL_COMMUNITY): Payer: Self-pay | Admitting: Adult Health

## 2012-01-22 ENCOUNTER — Ambulatory Visit (HOSPITAL_COMMUNITY)
Admission: RE | Admit: 2012-01-22 | Discharge: 2012-01-22 | Disposition: A | Discharge status: Home / Self Care | Payer: Medicaid Other | Source: Ambulatory Visit | Attending: Internal Medicine | Admitting: Internal Medicine

## 2012-01-22 ENCOUNTER — Telehealth (HOSPITAL_COMMUNITY): Payer: Self-pay | Admitting: Adult Health

## 2012-01-22 ENCOUNTER — Encounter (HOSPITAL_COMMUNITY): Payer: Self-pay | Admitting: *Deleted

## 2012-01-22 VITALS — BP 110/78 | HR 74 | Wt 181.0 lb

## 2012-01-22 DIAGNOSIS — Z888 Allergy status to other drugs, medicaments and biological substances status: Secondary | ICD-10-CM | POA: Insufficient documentation

## 2012-01-22 DIAGNOSIS — I509 Heart failure, unspecified: Secondary | ICD-10-CM | POA: Insufficient documentation

## 2012-01-22 DIAGNOSIS — I2789 Other specified pulmonary heart diseases: Secondary | ICD-10-CM

## 2012-01-22 DIAGNOSIS — E669 Obesity, unspecified: Secondary | ICD-10-CM | POA: Insufficient documentation

## 2012-01-22 DIAGNOSIS — Z21 Asymptomatic human immunodeficiency virus [HIV] infection status: Secondary | ICD-10-CM | POA: Insufficient documentation

## 2012-01-22 DIAGNOSIS — Z79899 Other long term (current) drug therapy: Secondary | ICD-10-CM | POA: Insufficient documentation

## 2012-01-22 DIAGNOSIS — G4733 Obstructive sleep apnea (adult) (pediatric): Secondary | ICD-10-CM | POA: Insufficient documentation

## 2012-01-22 DIAGNOSIS — I272 Pulmonary hypertension, unspecified: Secondary | ICD-10-CM

## 2012-01-22 DIAGNOSIS — I5032 Chronic diastolic (congestive) heart failure: Secondary | ICD-10-CM | POA: Insufficient documentation

## 2012-01-22 DIAGNOSIS — I10 Essential (primary) hypertension: Secondary | ICD-10-CM | POA: Insufficient documentation

## 2012-01-22 NOTE — Progress Notes (Signed)
Patient ID: Leslie Gates, female   DOB: 10/03/1966, 45 y.o.   MRN: [id number] HPI:  Leslie Gates is a 45-year-old woman with history of obesity, hypertension, HIV, and obstructive sleep apnea who was found to have pulmonary hypertension with RV dysfunction on echocardiogram.    Leslie Gates perfromed a RHC on August 2011.  RA 21, RV pressure 78/39/21.  Pulmonary artery pressure 84/44 (60). PCWP 25-30.  Fick  4.2 L/2.3 Pulmonary vascular resistance was 7.1 Woods units.  The patient had evidence of Cheyne-Stokes breathing during the procedure secondary to mild sedation.  She was started on spiro for her elevated L heart pressures but we also suggested the possibility of Adcirca as a pulmonary vasodilator.   She had a nuclear stress in January 2012 that was normal with EF 68%.  She has been followed by Leslie Gates for OSA and is wearing her CPAP nightly and O2 during the day.  She has lost about 30 pounds since last year.  She has not followed up with Leslie Gates in almost a year.  She recently followed up with Leslie Gates and functionally she is improving but still with Class II symptoms and felt it was time for reassessment of her pulmonary HTN.  She walked her 185 ft x 3 laps without desaturation.   11/12EF 60 %.RV mildly dilated minimal hyopkenesis. No TR jet.  She returns for follow up today. Breathing better.   Denies SOB/PND/Orthopnea.   Her dyspnea has improved greatly, she credits this to pulmonary rehab.  She is dyspneic with climbing stairs but says Completed pulmonary rehab. Continues to use CPAP. Wears oxygen as needed. Compliant with medications.    ROS: All other systems normal except as mentioned in HPI, past medical history and problem list.    Past Medical History  Diagnosis Date  . Pulmonary HTN   . OSA (obstructive sleep apnea)   . Diastolic CHF, chronic   . HTN (hypertension)   . Human immunodeficiency virus (HIV) 2001  . Wrist fracture, bilateral   . History of  herpes zoster   . History of cervical dysplasia   . Dysphagia   . Chronic otitis media   . Allergic rhinitis   . Obesity     Current Outpatient Prescriptions  Medication Sig Dispense Refill  . albuterol (PROVENTIL HFA;VENTOLIN HFA) 108 (90 BASE) MCG/ACT inhaler Inhale 1 puff into the lungs every 6 (six) hours as needed.  1 Inhaler  6  . amLODipine (NORVASC) 10 MG tablet Take 1 tablet (10 mg total) by mouth daily.  90 tablet  0  . cetirizine (ZYRTEC) 10 MG tablet TAKE ONE TABLET BY MOUTH DAILY  30 tablet  2  . emtricitabine-tenofovir (TRUVADA) 200-300 MG per tablet Take 1 tablet by mouth daily.  30 tablet  5  . furosemide (LASIX) 20 MG tablet Take 20 mg by mouth daily.       . loratadine (CLARITIN) 10 MG tablet Take 1 tablet (10 mg total) by mouth daily.  30 tablet  5  . Multiple Vitamin (MULTIVITAMIN) tablet TAKE ONE TABLET BY MOUTH DAILY  30 tablet  12  . naphazoline-pheniramine (NAPHCON-A) 0.025-0.3 % ophthalmic solution Place 1 drop into both eyes 4 (four) times daily as needed.        . spironolactone (ALDACTONE) 25 MG tablet Take 25 mg by mouth daily.           Allergies  Allergen Reactions  . Lisinopril Swelling    Very swollen bottom lip      History   Social History  . Marital Status: Single    Spouse Name: N/A    Number of Children: 2  . Years of Education: N/A   Occupational History  .      unemployed   Social History Main Topics  . Smoking status: Never Smoker   . Smokeless tobacco: Never Used  . Alcohol Use: No  . Drug Use: No  . Sexually Active: Not Currently   Other Topics Concern  . Not on file   Social History Narrative  . No narrative on file    Family History  Problem Relation Age of Onset  . Other Mother 52    ESRD  . Other Father     GSW  . Hypertension      PHYSICAL EXAM: Filed Vitals:   01/22/12 0901  BP: 110/78  Pulse: 74  Wt 181 (183)  General:  Well appearing. No respiratory difficulty HEENT: normal Neck: supple. JVP 6-7.  Carotids 2+ bilat; no bruits. No lymphadenopathy or thryomegaly appreciated. Cor: PMI nondisplaced. Regular rate & rhythm. No rubs, gallops or murmurs. Lungs: clear Abdomen: obese, soft, nontender, nondistended. No hepatosplenomegaly. No bruits or masses. Good bowel sounds. Extremities: no cyanosis, clubbing, rash, edema Neuro: alert & oriented x 3, cranial nerves grossly intact. moves all 4 extremities w/o difficulty. Affect pleasant.    ASSESSMENT & PLAN:  

## 2012-01-22 NOTE — Patient Instructions (Addendum)
Will schedule RHC  Follow up in 3 months  Do the following things EVERYDAY: 1) Weigh yourself in the morning before breakfast. Write it down and keep it in a log. 2) Take your medicines as prescribed 3) Eat low salt foods--Limit salt (sodium) to 2000mg  per day.  4) Stay as active as you can everyday

## 2012-01-22 NOTE — Telephone Encounter (Signed)
RHC scheduled for February 01, 2012. Arrive at short stay at 7:30 am

## 2012-01-22 NOTE — Assessment & Plan Note (Addendum)
NYHA III. Voulme status stable. ECHO results discussed and improving.  EF 60 %.RV mildly dilated minimal hyopkenesis. No TR jet.  Recommend RHC to further asses hemodynamics. Continue current medications.  Follow up in 3 months.   Patient seen and examined with Tonye Becket, NP. We discussed all aspects of the encounter. I agree with the assessment and plan as stated above. She has improved from a functional standpoint and .euvolemic on exam. However, echo reveals persistent RV strain. We discussed options of watchful waiting or repeat RHC to reassess PH with potential titration of pulmonary vasodialtors. She would like to proceed with RHC.

## 2012-01-27 ENCOUNTER — Telehealth (HOSPITAL_COMMUNITY): Payer: Self-pay | Admitting: Physician Assistant

## 2012-01-27 DIAGNOSIS — I272 Pulmonary hypertension, unspecified: Secondary | ICD-10-CM

## 2012-01-27 NOTE — Telephone Encounter (Signed)
Addended by: Hadassah Pais on: 01/27/2012 03:27 PM   Modules accepted: Orders

## 2012-01-27 NOTE — Telephone Encounter (Signed)
Chip Boer from [facility] called this am regarding Ms Fosters labs. She went to [facility] yesterday to have some labs drawn but there were no orders in the computer. She is scheduled for a cath next Monday. Please call Chip Boer back. Thanks.  Returned a call back to [place] and clarified precath labs.  The orders have been placed in the computer.

## 2012-01-27 NOTE — Telephone Encounter (Signed)
Error

## 2012-01-29 ENCOUNTER — Other Ambulatory Visit: Payer: Medicaid Other

## 2012-01-29 DIAGNOSIS — I272 Pulmonary hypertension, unspecified: Secondary | ICD-10-CM

## 2012-01-29 LAB — BASIC METABOLIC PANEL
Calcium: 9.3 mg/dL (ref 8.4–10.5)
Chloride: 99 mEq/L (ref 96–112)
Creatinine, Ser: 0.7 mg/dL (ref 0.4–1.2)
GFR: 122.65 mL/min (ref >60.00)

## 2012-01-29 LAB — CBC
MCV: 87.1 fl (ref 78.0–100.0)
RBC: 4.53 Mil/uL (ref 3.87–5.11)
RDW: 12.9 % (ref 11.5–14.6)

## 2012-01-29 LAB — PROTIME-INR
INR: 1.1 ratio — ABNORMAL HIGH (ref 0.8–1.0)
Prothrombin Time: 11.8 s (ref 10.2–12.4)

## 2012-01-29 NOTE — Telephone Encounter (Signed)
Received call from [facility] labs and labs orders need to be placed under physician's name in order to cross over to [place]. Will reorder CBC, BMP, PT

## 2012-01-29 NOTE — Telephone Encounter (Signed)
Addended by: Dossie Arbour on: 01/29/2012 01:35 PM   Modules accepted: Orders

## 2012-02-01 ENCOUNTER — Other Ambulatory Visit: Payer: Self-pay

## 2012-02-01 ENCOUNTER — Encounter (HOSPITAL_COMMUNITY)
Admission: RE | Disposition: A | Discharge status: Home / Self Care | Payer: Self-pay | Source: Ambulatory Visit | Attending: Internal Medicine

## 2012-02-01 ENCOUNTER — Encounter (HOSPITAL_BASED_OUTPATIENT_CLINIC_OR_DEPARTMENT_OTHER): Admission: RE | Payer: Self-pay | Source: Ambulatory Visit

## 2012-02-01 ENCOUNTER — Inpatient Hospital Stay (HOSPITAL_BASED_OUTPATIENT_CLINIC_OR_DEPARTMENT_OTHER): Admission: RE | Admit: 2012-02-01 | Payer: Medicaid Other | Source: Ambulatory Visit | Admitting: Internal Medicine

## 2012-02-01 ENCOUNTER — Ambulatory Visit (HOSPITAL_COMMUNITY)
Admission: RE | Admit: 2012-02-01 | Discharge: 2012-02-01 | Disposition: A | Discharge status: Home / Self Care | Payer: Medicaid Other | Source: Ambulatory Visit | Attending: Internal Medicine | Admitting: Internal Medicine

## 2012-02-01 DIAGNOSIS — I5022 Chronic systolic (congestive) heart failure: Secondary | ICD-10-CM | POA: Insufficient documentation

## 2012-02-01 DIAGNOSIS — G4733 Obstructive sleep apnea (adult) (pediatric): Secondary | ICD-10-CM | POA: Insufficient documentation

## 2012-02-01 DIAGNOSIS — E669 Obesity, unspecified: Secondary | ICD-10-CM | POA: Insufficient documentation

## 2012-02-01 DIAGNOSIS — Z21 Asymptomatic human immunodeficiency virus [HIV] infection status: Secondary | ICD-10-CM | POA: Insufficient documentation

## 2012-02-01 DIAGNOSIS — I10 Essential (primary) hypertension: Secondary | ICD-10-CM | POA: Insufficient documentation

## 2012-02-01 DIAGNOSIS — I509 Heart failure, unspecified: Secondary | ICD-10-CM

## 2012-02-01 DIAGNOSIS — I2789 Other specified pulmonary heart diseases: Secondary | ICD-10-CM | POA: Insufficient documentation

## 2012-02-01 DIAGNOSIS — I272 Pulmonary hypertension, unspecified: Secondary | ICD-10-CM

## 2012-02-01 HISTORY — PX: RIGHT HEART CATHETERIZATION: SHX5447

## 2012-02-01 LAB — POCT I-STAT 3, VENOUS BLOOD GAS (G3P V)
Bicarbonate: 25.4 mEq/L — ABNORMAL HIGH (ref 20.0–24.0)
pCO2, Ven: 49.6 mmHg (ref 45.0–50.0)
pH, Ven: 7.316 — ABNORMAL HIGH (ref 7.250–7.300)
pO2, Ven: 42 mmHg (ref 30.0–45.0)

## 2012-02-01 LAB — POCT I-STAT 3, ART BLOOD GAS (G3+)

## 2012-02-01 SURGERY — RIGHT HEART CATH
Anesthesia: LOCAL

## 2012-02-01 SURGERY — JV LEFT HEART CATHETERIZATION WITH CORONARY ANGIOGRAM
Anesthesia: Moderate Sedation

## 2012-02-01 MED ORDER — LIDOCAINE HCL (PF) 1 % IJ SOLN
INTRAMUSCULAR | Status: AC
  Filled 2012-02-01: qty 30

## 2012-02-01 MED ORDER — ACETAMINOPHEN 325 MG PO TABS: 650.0000 mg | ORAL_TABLET | ORAL | Status: DC | PRN

## 2012-02-01 MED ORDER — FENTANYL CITRATE 0.05 MG/ML IJ SOLN
INTRAMUSCULAR | Status: AC
  Filled 2012-02-01: qty 2

## 2012-02-01 MED ORDER — SODIUM CHLORIDE 0.9 % IJ SOLN: 3.0000 mL | INTRAMUSCULAR | Status: DC | PRN

## 2012-02-01 MED ORDER — MIDAZOLAM HCL 2 MG/2ML IJ SOLN
INTRAMUSCULAR | Status: AC
  Filled 2012-02-01: qty 2

## 2012-02-01 MED ORDER — SODIUM CHLORIDE 0.9 % IV SOLN: INTRAVENOUS | Status: DC

## 2012-02-01 MED ORDER — HEPARIN (PORCINE) IN NACL 2-0.9 UNIT/ML-% IJ SOLN
INTRAMUSCULAR | Status: AC
  Filled 2012-02-01: qty 1000

## 2012-02-01 MED ORDER — SODIUM CHLORIDE 0.9 % IJ SOLN: 3.0000 mL | Freq: Two times a day (BID) | INTRAMUSCULAR | Status: DC

## 2012-02-01 MED ORDER — DIAZEPAM 5 MG PO TABS
5.0000 mg | ORAL_TABLET | ORAL | Status: AC
  Administered 2012-02-01: 5 mg via ORAL

## 2012-02-01 MED ORDER — DIAZEPAM 5 MG PO TABS
ORAL_TABLET | ORAL | Status: AC
  Filled 2012-02-01: qty 1

## 2012-02-01 MED ORDER — SODIUM CHLORIDE 0.9 % IV SOLN: 250.0000 mL | INTRAVENOUS | Status: DC | PRN

## 2012-02-01 MED ORDER — ONDANSETRON HCL 4 MG/2ML IJ SOLN: 4.0000 mg | Freq: Four times a day (QID) | INTRAMUSCULAR | Status: DC | PRN

## 2012-02-01 MED ORDER — ASPIRIN 81 MG PO CHEW
CHEWABLE_TABLET | ORAL | Status: AC
  Filled 2012-02-01: qty 4

## 2012-02-01 MED ORDER — ASPIRIN 81 MG PO CHEW
324.0000 mg | CHEWABLE_TABLET | ORAL | Status: AC
  Administered 2012-02-01: 324 mg via ORAL

## 2012-02-01 NOTE — H&P (View-Only) (Signed)
Patient ID: [name], female   DOB: 24-Oct-1966, 46 y.o.   MRN: [id number] HPI:  Ms. Pederson is a 46 year old woman with history of obesity, hypertension, HIV, and obstructive sleep apnea who was found to have pulmonary hypertension with RV dysfunction on echocardiogram.    Dr. Gala Romney perfromed a RHC on August 2011.  RA 21, RV pressure 78/39/21.  Pulmonary artery pressure 84/44 (60). PCWP 25-30.  Fick  4.2 L/2.3 Pulmonary vascular resistance was 7.1 Woods units.  The patient had evidence of Cheyne-Stokes breathing during the procedure secondary to mild sedation.  She was started on spiro for her elevated L heart pressures but we also suggested the possibility of Adcirca as a pulmonary vasodilator.   She had a nuclear stress in January 2012 that was normal with EF 68%.  She has been followed by Dr. Marchelle Gearing for OSA and is wearing her CPAP nightly and O2 during the day.  She has lost about 30 pounds since last year.  She has not followed up with Dr. Gala Romney in almost a year.  She recently followed up with Dr. Marchelle Gearing and functionally she is improving but still with Class II symptoms and felt it was time for reassessment of her pulmonary HTN.  She walked her 185 ft x 3 laps without desaturation.   11/12EF 60 %.RV mildly dilated minimal hyopkenesis. No TR jet.  She returns for follow up today. Breathing better.   Denies SOB/PND/Orthopnea.   Her dyspnea has improved greatly, she credits this to pulmonary rehab.  She is dyspneic with climbing stairs but says Completed pulmonary rehab. Continues to use CPAP. Wears oxygen as needed. Compliant with medications.    ROS: All other systems normal except as mentioned in HPI, past medical history and problem list.    Past Medical History  Diagnosis Date  . Pulmonary HTN   . OSA (obstructive sleep apnea)   . Diastolic CHF, chronic   . HTN (hypertension)   . Human immunodeficiency virus (HIV) 2001  . Wrist fracture, bilateral   . History of  herpes zoster   . History of cervical dysplasia   . Dysphagia   . Chronic otitis media   . Allergic rhinitis   . Obesity     Current Outpatient Prescriptions  Medication Sig Dispense Refill  . albuterol (PROVENTIL HFA;VENTOLIN HFA) 108 (90 BASE) MCG/ACT inhaler Inhale 1 puff into the lungs every 6 (six) hours as needed.  1 Inhaler  6  . amLODipine (NORVASC) 10 MG tablet Take 1 tablet (10 mg total) by mouth daily.  90 tablet  0  . cetirizine (ZYRTEC) 10 MG tablet TAKE ONE TABLET BY MOUTH DAILY  30 tablet  2  . emtricitabine-tenofovir (TRUVADA) 200-300 MG per tablet Take 1 tablet by mouth daily.  30 tablet  5  . furosemide (LASIX) 20 MG tablet Take 20 mg by mouth daily.       Marland Kitchen loratadine (CLARITIN) 10 MG tablet Take 1 tablet (10 mg total) by mouth daily.  30 tablet  5  . Multiple Vitamin (MULTIVITAMIN) tablet TAKE ONE TABLET BY MOUTH DAILY  30 tablet  12  . naphazoline-pheniramine (NAPHCON-A) 0.025-0.3 % ophthalmic solution Place 1 drop into both eyes 4 (four) times daily as needed.        Marland Kitchen spironolactone (ALDACTONE) 25 MG tablet Take 25 mg by mouth daily.           Allergies  Allergen Reactions  . Lisinopril Swelling    Very swollen bottom lip  History   Social History  . Marital Status: Single    Spouse Name: N/A    Number of Children: 2  . Years of Education: N/A   Occupational History  .      unemployed   Social History Main Topics  . Smoking status: Never Smoker   . Smokeless tobacco: Never Used  . Alcohol Use: No  . Drug Use: No  . Sexually Active: Not Currently   Other Topics Concern  . Not on file   Social History Narrative  . No narrative on file    Family History  Problem Relation Age of Onset  . Other Mother 34    ESRD  . Other Father     GSW  . Hypertension      PHYSICAL EXAM: Filed Vitals:   01/22/12 0901  BP: 110/78  Pulse: 74  Wt 181 (183)  General:  Well appearing. No respiratory difficulty HEENT: normal Neck: supple. JVP 6-7.  Carotids 2+ bilat; no bruits. No lymphadenopathy or thryomegaly appreciated. Cor: PMI nondisplaced. Regular rate & rhythm. No rubs, gallops or murmurs. Lungs: clear Abdomen: obese, soft, nontender, nondistended. No hepatosplenomegaly. No bruits or masses. Good bowel sounds. Extremities: no cyanosis, clubbing, rash, edema Neuro: alert & oriented x 3, cranial nerves grossly intact. moves all 4 extremities w/o difficulty. Affect pleasant.    ASSESSMENT & PLAN:

## 2012-02-01 NOTE — Interval H&P Note (Signed)
History and Physical Interval Note:  02/01/2012 10:39 AM  Leslie Gates  has presented today for surgery, with the diagnosis of CHF  The various methods of treatment have been discussed with the patient and family. After consideration of risks, benefits and other options for treatment, the patient has consented to  Procedure(s) (LRB): RIGHT HEART CATH (N/A) as a surgical intervention .  The patients' history has been reviewed, patient examined, no change in status, stable for surgery.  I have reviewed the patients' chart and labs.  Questions were answered to the patient's satisfaction.     Clois Treanor

## 2012-02-01 NOTE — Discharge Instructions (Signed)

## 2012-02-01 NOTE — Op Note (Signed)
Cardiac Cath Procedure Note:  Indication:   Procedures performed:  1) Right heart catheterization  Description of procedure:   The risks and indication of the procedure were explained. Consent was signed and placed on the chart. An appropriate timeout was taken prior to the procedure. The right groin was prepped and draped in the routine sterile fashion and anesthetized with 1% local lidocaine.   A 7 FR venous sheath was placed in the right femoral vein using a modified Seldinger technique. A standard Swan-Ganz catheter was used for the procedure.   Complications: None apparent.  Findings:  RA =  6 RV = 39/4/8 PA =  42/14 (25) PCW =  9 Fick cardiac output/index = 6.2/3.2 PVR = 2.5 Woods FA sat = 96% PA sat = 72%, 73%  Assessment:  1. Minimal PAH with almost complete resolution of pulmonary HTN. 2. Well compensated left-side pressures  Plan/Discussion:  Continue current therapy.  Ida Uppal 10:59 AM

## 2012-02-02 LAB — POCT I-STAT 3, VENOUS BLOOD GAS (G3P V)
Bicarbonate: 26.3 mEq/L — ABNORMAL HIGH (ref 20.0–24.0)
O2 Saturation: 73 %
TCO2: 28 mmol/L (ref 0–100)
pO2, Ven: 42 mmHg (ref 30.0–45.0)

## 2012-02-15 ENCOUNTER — Other Ambulatory Visit: Payer: Self-pay | Admitting: *Deleted

## 2012-02-15 ENCOUNTER — Telehealth: Payer: Self-pay | Admitting: *Deleted

## 2012-02-15 DIAGNOSIS — I272 Pulmonary hypertension, unspecified: Secondary | ICD-10-CM

## 2012-02-15 NOTE — Telephone Encounter (Signed)
Called and left pt a voice mail to call the clinic to schedule lab and office visit.  Received request from pharmacy to fill spironolactone and do not see when this was previously prescribed by this office, faxed back to [facility] denied stating patient needs f/u appointment. Wendall Mola CMA

## 2012-02-19 ENCOUNTER — Emergency Department (HOSPITAL_COMMUNITY)
Admission: EM | Admit: 2012-02-19 | Discharge: 2012-02-19 | Disposition: A | Discharge status: Home / Self Care | Payer: Medicaid Other | Attending: Emergency Medicine | Admitting: Emergency Medicine

## 2012-02-19 ENCOUNTER — Emergency Department (HOSPITAL_COMMUNITY): Payer: Medicaid Other

## 2012-02-19 ENCOUNTER — Encounter (HOSPITAL_COMMUNITY): Payer: Self-pay | Admitting: *Deleted

## 2012-02-19 DIAGNOSIS — I509 Heart failure, unspecified: Secondary | ICD-10-CM | POA: Insufficient documentation

## 2012-02-19 DIAGNOSIS — I5032 Chronic diastolic (congestive) heart failure: Secondary | ICD-10-CM | POA: Insufficient documentation

## 2012-02-19 DIAGNOSIS — I2510 Atherosclerotic heart disease of native coronary artery without angina pectoris: Secondary | ICD-10-CM | POA: Insufficient documentation

## 2012-02-19 DIAGNOSIS — Z79899 Other long term (current) drug therapy: Secondary | ICD-10-CM | POA: Insufficient documentation

## 2012-02-19 DIAGNOSIS — Z21 Asymptomatic human immunodeficiency virus [HIV] infection status: Secondary | ICD-10-CM | POA: Insufficient documentation

## 2012-02-19 DIAGNOSIS — I10 Essential (primary) hypertension: Secondary | ICD-10-CM | POA: Insufficient documentation

## 2012-02-19 DIAGNOSIS — J40 Bronchitis, not specified as acute or chronic: Secondary | ICD-10-CM

## 2012-02-19 HISTORY — DX: Atherosclerotic heart disease of native coronary artery without angina pectoris: I25.10

## 2012-02-19 LAB — BASIC METABOLIC PANEL
Calcium: 9.1 mg/dL (ref 8.4–10.5)
Chloride: 98 mEq/L (ref 96–112)
Creatinine, Ser: 0.78 mg/dL (ref 0.50–1.10)
GFR calc Af Amer: >90 mL/min (ref >90)

## 2012-02-19 LAB — DIFFERENTIAL
Basophils Absolute: 0 10*3/uL (ref 0.0–0.1)
Basophils Relative: 0 % (ref 0–1)
Monocytes Absolute: 0.4 10*3/uL (ref 0.1–1.0)
Neutro Abs: 4.2 10*3/uL (ref 1.7–7.7)

## 2012-02-19 LAB — CBC
HCT: 37.8 % (ref 36.0–46.0)
MCHC: 33.6 g/dL (ref 30.0–36.0)
RDW: 13.4 % (ref 11.5–15.5)

## 2012-02-19 MED ORDER — DOXYCYCLINE HYCLATE 100 MG PO CAPS: 100.0000 mg | ORAL_CAPSULE | Freq: Two times a day (BID) | ORAL | Status: AC

## 2012-02-19 NOTE — ED Provider Notes (Signed)
History     CSN: [id number]  Arrival date & time 02/19/12  1316   First MD Initiated Contact with Patient 02/19/12 1532      Chief Complaint  Patient presents with  . Cough  . Nasal Congestion    (Consider location/radiation/quality/duration/timing/severity/associated sxs/prior treatment) Patient is a 46 y.o. female presenting with cough.  Cough   Cough, nasal congestion, headache, chills but denies fever for three days.  Denies shorntess of breath, nausea or vomiting.  Children in home with similar symptoms.   Past Medical History  Diagnosis Date  . Pulmonary HTN   . OSA (obstructive sleep apnea)   . Diastolic CHF, chronic   . HTN (hypertension)   . Human immunodeficiency virus (HIV) 2001  . Wrist fracture, bilateral   . History of herpes zoster   . History of cervical dysplasia   . Dysphagia   . Chronic otitis media   . Allergic rhinitis   . Obesity   . Coronary artery disease     Past Surgical History  Procedure Date  . Tympanostomy tube placement     as a child  . Cesarean section   . Nasal sinus surgery 2004    [facility]  . Wrist fracture surgery 2006    bilateral - [facility]  . Adenoidectomy     Family History  Problem Relation Age of Onset  . Other Mother 76    ESRD  . Other Father     GSW  . Hypertension      History  Substance Use Topics  . Smoking status: Never Smoker   . Smokeless tobacco: Never Used  . Alcohol Use: No    OB History    Grav Para Term Preterm Abortions TAB SAB Ect Mult Living                  Review of Systems  Respiratory: Positive for cough.   All other systems reviewed and are negative.    Allergies  Lisinopril  Home Medications   Current Outpatient Rx  Name Route Sig Dispense Refill  . ALBUTEROL SULFATE HFA 108 (90 BASE) MCG/ACT IN AERS Inhalation Inhale 1 puff into the lungs every 6 (six) hours as needed. For wheezing or shortness breath    . AMLODIPINE BESYLATE 10 MG PO TABS Oral Take 10 mg by mouth  daily.    Marland Kitchen CETIRIZINE HCL 10 MG PO TABS Oral Take 10 mg by mouth daily.    Marland Kitchen EMTRICITABINE-TENOFOVIR 200-300 MG PO TABS Oral Take 1 tablet by mouth daily.    . FUROSEMIDE 20 MG PO TABS Oral Take 20 mg by mouth daily.     Marland Kitchen LORATADINE 10 MG PO TABS Oral Take 10 mg by mouth daily.    . ADULT MULTIVITAMIN W/MINERALS CH Oral Take 1 tablet by mouth daily.    Marland Kitchen SPIRONOLACTONE 25 MG PO TABS Oral Take 25 mg by mouth daily.       BP 107/69  Pulse 91  Temp(Src) 98.6 F (37 C) (Oral)  Resp 16  SpO2 95%  Physical Exam  Vitals reviewed.   ED Course  Procedures (including critical care time)  Labs Reviewed - No data to display Dg Chest 2 View  02/19/2012  *RADIOLOGY REPORT*  Clinical Data: Congestion, dizziness, body aches  CHEST - 2 VIEW  Comparison: Chest x-Abanoub Hanken of 04/10/2011  Findings: No active infiltrate or effusion is seen.  Mediastinal contours are stable.  The heart is within upper limits of normal. No acute  bony abnormality is noted.  IMPRESSION: Stable chest x-Noel Rodier.  No active lung disease.  Original Report Authenticated By: Juline Patch, M.D.     No diagnosis found.    Patient with URI symptoms. She does have HIV and has not had hers white blood cell count checked recently. This is checked here and is normal. Electrolytes are normal with the exception of her potassium is slightly low and her glucose is slightly high. She is advised of these abnormalities and need for recheck. She is placed on doxycycline for her upper respiratory infection given her status of HIV.       Hilario Quarry, MD 02/22/12 1355

## 2012-02-19 NOTE — ED Notes (Signed)
To ED for eval of congestion and cough for the past 3 days. Denies fevers, but c/o body aches.

## 2012-02-19 NOTE — Discharge Instructions (Signed)
Bronchitis Bronchitis is a problem of the air tubes leading to your lungs. This problem makes it hard for air to get in and out of the lungs. You may cough a lot because your air tubes are narrow. Going without care can cause lasting (chronic) bronchitis. HOME CARE   Drink enough fluids to keep your pee (urine) clear or pale yellow.   Use a cool mist humidifier.   Quit smoking if you smoke. If you keep smoking, the bronchitis might not get better.   Only take medicine as told by your doctor.  GET HELP RIGHT AWAY IF:   Coughing keeps you awake.   You start to wheeze.   You become more sick or weak.   You have a hard time breathing or get short of breath.   You cough up blood.   Coughing lasts more than 2 weeks.   You have a fever.   Your baby is older than 3 months with a rectal temperature of 102 F (38.9 C) or higher.   Your baby is 3 months old or younger with a rectal temperature of 100.4 F (38 C) or higher.  MAKE SURE YOU:  Understand these instructions.   Will watch your condition.   Will get help right away if you are not doing well or get worse.  Document Released: 04/20/2008 Document Revised: 10/22/2011 Document Reviewed: 10/04/2009 Exit[employer] Patient Information 2012 [employer]. 

## 2012-02-25 ENCOUNTER — Other Ambulatory Visit: Payer: Self-pay | Admitting: Internal Medicine

## 2012-03-08 ENCOUNTER — Other Ambulatory Visit: Payer: Self-pay | Admitting: Licensed Clinical Social Worker

## 2012-06-30 ENCOUNTER — Other Ambulatory Visit: Payer: Self-pay | Admitting: Infectious Disease

## 2012-06-30 ENCOUNTER — Other Ambulatory Visit: Payer: Self-pay | Admitting: Internal Medicine

## 2012-06-30 ENCOUNTER — Other Ambulatory Visit: Payer: Self-pay | Admitting: Infectious Diseases

## 2012-07-11 ENCOUNTER — Ambulatory Visit: Payer: Medicaid Other

## 2012-07-21 ENCOUNTER — Ambulatory Visit: Payer: Medicaid Other

## 2012-07-27 ENCOUNTER — Encounter (HOSPITAL_COMMUNITY): Payer: Self-pay

## 2012-07-27 ENCOUNTER — Ambulatory Visit (HOSPITAL_COMMUNITY)
Admission: RE | Admit: 2012-07-27 | Discharge: 2012-07-27 | Disposition: A | Discharge status: Home / Self Care | Payer: Medicaid Other | Source: Ambulatory Visit | Attending: Internal Medicine | Admitting: Internal Medicine

## 2012-07-27 VITALS — BP 124/84 | HR 80 | Ht 62.0 in | Wt 187.0 lb

## 2012-07-27 DIAGNOSIS — I2789 Other specified pulmonary heart diseases: Secondary | ICD-10-CM | POA: Insufficient documentation

## 2012-07-27 DIAGNOSIS — R0602 Shortness of breath: Secondary | ICD-10-CM | POA: Insufficient documentation

## 2012-07-27 NOTE — Patient Instructions (Addendum)
Your physician wants you to follow-up in: 4 months (Jan. 2014) You will receive a reminder letter in the mail two months in advance. If you don't receive a letter, please call our office to schedule the follow-up appointment.  Your physician has requested that you have an echocardiogram. Echocardiography is a painless test that uses sound waves to create images of your heart. It provides your doctor with information about the size and shape of your heart and how well your heart's chambers and valves are working. This procedure takes approximately one hour. There are no restrictions for this procedure.  You have been referred to [facility] Dr.Ramasmi 08/02/2012 @ 4:00pm

## 2012-07-27 NOTE — Assessment & Plan Note (Addendum)
Functional status continues to improve.  No volume on exam.  RHC with no need for further pulmonary dilator titration.  Will use O2 only as needed at this time and follow up with Dr. Marchelle Gearing with repeat PFTs.  Will also recheck echo as last echo was completed last November.      Patient seen and examined with Tonye Becket, NP. We discussed all aspects of the encounter. I agree with the assessment and plan as stated above. She is doing great. Reviewed results of recent RHC. Pressures essentially normal. Reinforced need to continue exercise program. Can wean oxygen as tolerated. Gave her information on how to get finger pulse oximeter. Reinforced need for daily weights and reviewed use of sliding scale diuretics.

## 2012-07-27 NOTE — Progress Notes (Signed)
HPI:  Ms. Mapel is a 46 year old woman with history of obesity, hypertension, HIV, and obstructive sleep apnea who was found to have pulmonary hypertension with RV dysfunction on echocardiogram.    Dr. Gala Romney perfromed a RHC on August 2011.  RA 21, RV pressure 78/39/21.  Pulmonary artery pressure 84/44 (60). PCWP 25-30.  Fick  4.2 L/2.3 Pulmonary vascular resistance was 7.1 Woods units.  The patient had evidence of Cheyne-Stokes breathing during the procedure secondary to mild sedation.  She was started on spiro for her elevated L heart pressures but we also suggested the possibility of Adcirca as a pulmonary vasodilator.   She had a nuclear stress in January 2012 that was normal with EF 68%.  She has been followed by Dr. Marchelle Gearing for OSA and is wearing her CPAP nightly and O2 during the day.  She has lost about 30 pounds since last year.   09/2011 EF 60 %.RV mildly dilated minimal hyopkenesis. No TR jet.  02/01/12: RHC RA = 6  RV = 39/4/8  PA = 42/14 (25)  PCW = 9  Fick cardiac output/index = 6.2/3.2  PVR = 2.5 Woods  FA sat = 96%  PA sat = 72%, 73%  She returns for follow up today.  She is doing well.  She is walking 10-15 laps around the track.  Denies SOB/PND/Orthopnea.  She gets dyspneic with increased activities but slows down and catches her breath.  Continues to complete breathing exercises as she was taught in pulm rehab.  Wears continous O2 at 2L.  Continues to use CPAP.  Compliant with medications.   Ambulated in halls without O2 and O2 sat remained >97% with adequate HR response.    ROS: All other systems normal except as mentioned in HPI, past medical history and problem list.    Past Medical History  Diagnosis Date  . Pulmonary HTN   . OSA (obstructive sleep apnea)   . Diastolic CHF, chronic   . HTN (hypertension)   . Human immunodeficiency virus (HIV) 2001  . Wrist fracture, bilateral   . History of herpes zoster   . History of cervical dysplasia   .  Dysphagia   . Chronic otitis media   . Allergic rhinitis   . Obesity   . Coronary artery disease     Current Outpatient Prescriptions  Medication Sig Dispense Refill  . albuterol (PROVENTIL HFA;VENTOLIN HFA) 108 (90 BASE) MCG/ACT inhaler Inhale 1 puff into the lungs every 6 (six) hours as needed. For wheezing or shortness breath      . amLODipine (NORVASC) 10 MG tablet Take 1 tablet by mouth daily.  90 tablet  21  . cetirizine (ZYRTEC) 10 MG tablet Take 10 mg by mouth daily.      . furosemide (LASIX) 20 MG tablet Take 20 mg by mouth daily.       . INTELENCE 200 MG TABS Take 2 tablets by mouth every day  60 tablet  4  . loratadine (CLARITIN) 10 MG tablet Take 1 tablet (10 mg total) by mouth daily.  30 tablet  4  . Multiple Vitamin (MULTIVITAMIN) tablet TAKE ONE TABLET BY MOUTH DAILY  30 tablet  6  . spironolactone (ALDACTONE) 25 MG tablet Take 25 mg by mouth daily.       . TRUVADA 200-300 MG per tablet Take 1 tablet by mouth daily.  30 tablet  4  . DISCONTD: amLODipine (NORVASC) 10 MG tablet Take 10 mg by mouth daily.      Marland Kitchen  DISCONTD: emtricitabine-tenofovir (TRUVADA) 200-300 MG per tablet Take 1 tablet by mouth daily.      Marland Kitchen DISCONTD: loratadine (CLARITIN) 10 MG tablet Take 10 mg by mouth daily.        Allergies  Allergen Reactions  . Lisinopril Swelling    Very swollen bottom lip   PHYSICAL EXAM: Filed Vitals:   07/27/12 0948  BP: 124/84  Pulse: 80  Height: 5\' 2"  (1.575 m)  Weight: 187 lb (84.823 kg)  SpO2: 80%    General:  Well appearing. No respiratory difficulty on 2 L n/c HEENT: normal Neck: supple. JVP flat. Carotids 2+ bilat; no bruits. No lymphadenopathy or thryomegaly appreciated. Cor: PMI nondisplaced. Regular rate & rhythm. No rubs, gallops or murmurs. Lungs: clear Abdomen: obese, soft, nontender, nondistended. No hepatosplenomegaly. No bruits or masses. Good bowel sounds. Extremities: no cyanosis, clubbing, rash, edema Neuro: alert & oriented x 3, cranial  nerves grossly intact. moves all 4 extremities w/o difficulty. Affect pleasant.    ASSESSMENT & PLAN:

## 2012-07-28 ENCOUNTER — Other Ambulatory Visit: Payer: Self-pay | Admitting: Infectious Disease

## 2012-08-02 ENCOUNTER — Ambulatory Visit: Payer: Medicaid Other | Admitting: Internal Medicine

## 2012-08-09 ENCOUNTER — Ambulatory Visit
Admission: RE | Admit: 2012-08-09 | Discharge: 2012-08-09 | Disposition: A | Discharge status: Home / Self Care | Payer: Medicaid Other | Source: Ambulatory Visit | Attending: Internal Medicine | Admitting: Internal Medicine

## 2012-08-09 DIAGNOSIS — Z1231 Encounter for screening mammogram for malignant neoplasm of breast: Secondary | ICD-10-CM

## 2012-08-10 ENCOUNTER — Telehealth: Payer: Self-pay | Admitting: Internal Medicine

## 2012-08-10 NOTE — Telephone Encounter (Signed)
Attempt to call patient at number provided, no answer. [id number]

## 2012-08-11 ENCOUNTER — Ambulatory Visit: Payer: Medicaid Other | Admitting: Internal Medicine

## 2012-08-11 NOTE — Telephone Encounter (Signed)
Patient requesting to cancel her appt today with MR because she feels she should see Dr. Gala Romney first and that appt is on Fri., 08/12/12. The pt has resch her appt with MR to 08/30/12.

## 2012-08-12 ENCOUNTER — Ambulatory Visit (HOSPITAL_COMMUNITY)
Admission: RE | Admit: 2012-08-12 | Discharge: 2012-08-12 | Disposition: A | Discharge status: Home / Self Care | Payer: Medicaid Other | Source: Ambulatory Visit | Attending: Internal Medicine | Admitting: Internal Medicine

## 2012-08-12 DIAGNOSIS — I379 Nonrheumatic pulmonary valve disorder, unspecified: Secondary | ICD-10-CM | POA: Insufficient documentation

## 2012-08-12 DIAGNOSIS — I270 Primary pulmonary hypertension: Secondary | ICD-10-CM | POA: Insufficient documentation

## 2012-08-12 DIAGNOSIS — I2789 Other specified pulmonary heart diseases: Secondary | ICD-10-CM

## 2012-08-12 DIAGNOSIS — I517 Cardiomegaly: Secondary | ICD-10-CM

## 2012-08-12 NOTE — Progress Notes (Signed)
  Echocardiogram 2D Echocardiogram has been performed.  Leslie Gates 08/12/2012, 10:01 AM

## 2012-08-30 ENCOUNTER — Encounter: Payer: Self-pay | Admitting: Internal Medicine

## 2012-08-30 ENCOUNTER — Ambulatory Visit (INDEPENDENT_AMBULATORY_CARE_PROVIDER_SITE_OTHER): Payer: Medicaid Other | Admitting: Internal Medicine

## 2012-08-30 VITALS — BP 114/72 | HR 81 | Temp 99.0°F | Ht 62.0 in | Wt 186.0 lb

## 2012-08-30 DIAGNOSIS — B20 Human immunodeficiency virus [HIV] disease: Secondary | ICD-10-CM

## 2012-08-30 DIAGNOSIS — R0602 Shortness of breath: Secondary | ICD-10-CM

## 2012-08-30 DIAGNOSIS — E669 Obesity, unspecified: Secondary | ICD-10-CM

## 2012-08-30 NOTE — Progress Notes (Signed)
Subjective:    Patient ID: Leslie Gates, female    DOB: 05/12/1966, 46 y.o.   MRN: [id number]  HPI #HIV - since 2000  - non compliant; last ID visit fall 2012  #Obesity - BMI 34.22 Aug 2011  - Body mass index is 34.02 kg/(m^2). - Oct 2013   #OSA. -> Dr Craige Cotta  - since 2003  - Dr Craige Cotta since August 2011  - Nov 2011: DR Craige Cotta has noticed that CPAP was not working and changed to auto BIPAP 25-5  - complant with bipap as of oct 2013  #Diastolic Dysfunction  - Has  ECHO June 2011 with Grade 2 diast dysfunction  #Pulmonary Hypertension - Rt heart cath on 06/23/2010. PA meanis 60, PCWP is 25-30, TRANPULM GRADIENT - 30, CI is 2.3L, and PVR is 7.1 Woods Unit.  - Spironlactone was started. AFter this dyspnea and edema has improved  #Dyspnea (multifactorial)   - Cass 3 dyspnea with desaturation at 185 feet of walkingon room air in July 2011.  -  PFTs Spirometry June 2011 suggestive of restriction,   - CT Chest June 201 without evidence of PE or parencymal disease.   - ABG on room air from May 13, 2010: pH 7.42, PaCO2 56, PaO2 59.  - Jan 2012: Still with class 3 dyspnea. Desaturated to 88% after walking 155 feet x 2 laps but not using o2 at night    OV 08/30/2012  1) HIV - not taking HAART. Says it always makes her nauseated and vomiting and poor appetite. Not seen ID > 1 year. Stopped ART Rx > 6 months ago. Looking for god's will. I asked her if HAART Rx is god's will. She agreed it is god's will but feels god should have medications without side effects and god has mvt and herbs as options as well. She also wants to change to a female staff ID member if possible. She says she is compliant with other medications but only has issues with HIV medications and related side effects  2) Dyspnea: improved. RHC march 2013 significantly better. Using bipap. She is compliant with lasix, and aldactone however. She is trying to lose weight but agrees pace of weight loss is not good. Current Estimated  Body mass index is 34.02 kg/(m^2) as calculated from the following:   Height as of this encounter: 5\' 2" (1.575 m).   Weight as of this encounter: 186 lb(84.369 kg). She feels her ethnicity is counter=productive for her weight loss due to dietary habits of african americans  Had RHC March 2013 and shows PAP is at cusp of normal to eleavated to 25 but with normal PVR and PCWP (see below). DR Bensimohn plans to continue current medical RX  RHC 02/01/12  -Findings:  RA = 6  RV = 39/4/8  PA = 42/14 (25)  PCW = 9  Fick cardiac output/index = 6.2/3.2  PVR = 2.5 Woods  FA sat = 96%  PA sat = 72%, 73%  Assessment:  1. Minimal PAH with almost complete resolution of pulmonary HTN.  2. Well compensated left-side pressures  Plan/Discussion:  Continue current therapy.   Past, Family, Social reviewed: as above     Review of Systems  Constitutional: Negative for fever and unexpected weight change.  HENT: Negative for ear pain, nosebleeds, congestion, sore throat, rhinorrhea, sneezing, trouble swallowing, dental problem, postnasal drip and sinus pressure.   Eyes: Negative for redness and itching.  Respiratory: Positive for cough and shortness of breath. Negative for chest tightness  and wheezing.   Cardiovascular: Negative for palpitations and leg swelling.  Gastrointestinal: Negative for nausea and vomiting.  Genitourinary: Negative for dysuria.  Musculoskeletal: Negative for joint swelling.  Skin: Negative for rash.  Neurological: Negative for headaches.  Hematological: Does not bruise/bleed easily.  Psychiatric/Behavioral: Negative for dysphoric mood. The patient is not nervous/anxious.    Current outpatient prescriptions:albuterol (PROVENTIL HFA;VENTOLIN HFA) 108 (90 BASE) MCG/ACT inhaler, Inhale 1 puff into the lungs every 6 (six) hours as needed. For wheezing or shortness breath, Disp: , Rfl: ;  amLODipine (NORVASC) 10 MG tablet, Take 1 tablet by mouth daily., Disp: 90 tablet, Rfl: 21;   cetirizine (ZYRTEC) 10 MG tablet, Take 10 mg by mouth daily., Disp: , Rfl:  furosemide (LASIX) 20 MG tablet, Take 20 mg by mouth daily. , Disp: , Rfl: ;  INTELENCE 200 MG TABS, Take 2 tablets by mouth every day, Disp: 60 tablet, Rfl: 4;  loratadine (CLARITIN) 10 MG tablet, Take 1 tablet (10 mg total) by mouth daily., Disp: 30 tablet, Rfl: 4;  Multiple Vitamin (MULTIVITAMIN) tablet, TAKE ONE TABLET BY MOUTH DAILY, Disp: 30 tablet, Rfl: 6;  spironolactone (ALDACTONE) 25 MG tablet, Take 25 mg by mouth daily. , Disp: , Rfl:     Objective:   Physical Exam GEN: A/Ox3; pleasant , NAD, obese   HEENT:  [place]/AT,  EACs-clear, TMs-wnl, NOSE-clear, THROAT-clear, no lesions, no postnasal drip or exudate noted.   NECK:  Supple w/ fair ROM; no JVD; normal carotid impulses w/o bruits; no thyromegaly or nodules palpated; no lymphadenopathy.  RESP  Coarse BS w/ no wheezing no accessory muscle use, no dullness to percussion  CARD:  RRR, no m/r/g  , no peripheral edema, pulses intact, no cyanosis or clubbing.  GI:   Soft & nt; nml bowel sounds; no organomegaly or masses detected.  Musco: Warm bil, no deformities or joint swelling noted.   Neuro: alert, no focal deficits noted.    Skin: Warm, no lesions or rashes         Assessment & Plan:

## 2012-08-30 NOTE — Patient Instructions (Addendum)
Glad you are doing better Continue medications and bipap Follow weight loss diet sheet I gave you along with instructions below I will see if Dr Drue Second will accept you in ID clinic Return in 6 months - lose 6 pounds at least in 6 months  For diet #WEight Management    - we discussed extensively about weight management   - follow low glycemic diet plan that I outlined for you after extensive discussion. Do not follow other plans  - General  - drink lot of water  - avoid all moderate and high glycemic foods especially bad fruits, breads, pastas, fried foods, battered foods, sugary foods  - make non-starchy vegetables your base in terms of volume you eat  - always make sure you balance good carbs, good protein and good fat source  - good carbs are non-starchy vegetables, uncanned beans in the left column and low glycemic fruits in the left colum  - good protein source is egg white, beans, tofu, fish, chicken breast, fish, [place] and bison. Remember meat has to be skinless  - good healthy fat source is nuts, and fish   - focusing on eating right healthy foods (left lane) and avoiding unhealthy foods (middle and right lane) is better way to lose weight than to go hypo-caloric  - focus on staying full by eating right  - having a daily and weekly plan for what you will eat and where you will eat depending on your work, social life schedule is very important   - watch out for misleading labels on grocery aisle: High Fiber and Low Fat labeled foods generally are high in bad carbs or sugar  - measure weight once  a week  - discipline and attitude is key. Do not care for anyone else's opinion or feelings. Only yours matters   - For breakfast  - most important meal of the day. So, eat daily breakfast. Do not skip   - recommend 1/2 to 1 cup steel cut oat meal or 1/2 to 1 cup fiber one 60 cal   Or  1 to 1.5 cups Kashi go-lean with non-fat plain milk or 60 Cal Silk Soy mild. Can add Berries. Can have  egg at same time for breakfast  - For snacks  - recommend total 2-3 snacks per day  - snack should be light and filling  - best times are between breakfast and lunch, lunch and dinner and sometimes post-dinner snack  - Nut are great snacks. Stick to low glycemic nuts (less than 50gm per day) and eat only the nuts in the left lane like peanuts, pista, almond, walnut  - Low glycemic fruits are great snacks. Have 1-2 servings each day of fruits from the left lane   - If you like yogurt or cottage cheese - recommend [place]ikos or Fage 0% greek yogourt or Plan non-fat yogurt or [place] non-fat cottage cheese. Theyse have the least sugar. Do no exceed 100-200 gram per day. Fruit yogurts are the worst  - For Lunch and dinner  - unlimited non-starchy vegetable (prefer raw fresh or roasted or grilled) with skinless chicken or fish  - Special Notes  - Nuts: Nut are great snacks and have heart benefits. Stick to low glycemic nuts (less than 50gm per day) and eat only the nuts in the left lane like peanuts, pista, almond, walnuts  -  Ok to eat above nuts daily but only < 50gm/day  - If you eat more than 50gm/day then you run  risk of eating too many calories or saturated fat  - AVoid nuts glazed with sugar. Nuts have to be in salted/original form or roasted   - Fruits: Eat 1-2 fresh fruit servings daily but fruits can be dangerous because of high sugar content. So, choose your fruits wisely. Eat only the low glycemic fruits (left lane). Eat them fresh.  Do not eat them canned  - Avoid all fresh juices except if you use the low glycemic fruits and make them yourself without adding extra sugar   - Dairy: Is optional. Eat zero fat or low fat, fruit free yogurts or cottage cheese but not more than 100-200g per day  - Restaurant  - all restaurants have bad and good choices. Even fast food restaurants offer you good choices  - at restaurants do no fall prey to social pressure.. One way to eat healthy at  restaurant is to eat healthy snack or light healthy meal before you go to restaurant so that will prevent your cravings  - Restaurants with worst choices: [place] (except [employer]ipot[employer], or [employer]), [place], [place]. At these restaurants avoid the bread, curry, fried and battered foods and chips  - Restaurants with best choices: greek, [place], [place], [place] (again here avoid bread, deep fried stuffed and pasta)  - Restaurants with Ok choice: McDonald's, [employer], [employer] (again here avoid the bread, fried stuff, fried meat)  - Always ask for grilled meat or vegetables, and fresh salad choices (get your salad dressing as low fat and to the side)

## 2012-08-31 ENCOUNTER — Telehealth: Payer: Self-pay | Admitting: *Deleted

## 2012-08-31 ENCOUNTER — Telehealth: Payer: Self-pay | Admitting: Internal Medicine

## 2012-08-31 NOTE — Assessment & Plan Note (Signed)
Discussed diet and advised low glycemic diet and gave her extensive nutrition counseling

## 2012-08-31 NOTE — Telephone Encounter (Signed)
HI Leslie Gates (cc to Dr Ninetta Lights and Daiva Eves)   This patient is non compliant with HIV ART Rx but is compliant with other Rx. She says side effects major issues. She is willing to come back to ID Clinic but is preferring a femal ID staff if possible for continuity. I hope you guys can accommodat her request. Her pulm htn has improved with diuresis and bipap  Thanks  MR

## 2012-08-31 NOTE — Assessment & Plan Note (Signed)
She wants to see female ID staff if possible. Dr Drue Second has agreed to see patient but will seek approval of change from Dr Ninetta Lights or Dr Synthia Innocent. I will send a phone note to all of them. She is not compliant with HIV Rx but is compliant with othre Rx

## 2012-08-31 NOTE — Telephone Encounter (Signed)
Called patient to try and schedule her for an appt with Dr Drue Second but got her voice mail and advised her to call the office asap to get on the schedule. Will try to call her back later today.

## 2012-08-31 NOTE — Telephone Encounter (Signed)
Thanks [facility] would be a good one for her then! Asher Muir can you plug in this pt with [facility] with Dr. Drue Second?

## 2012-08-31 NOTE — Telephone Encounter (Signed)
We will get her into my clinic. Thanks for referral.

## 2012-08-31 NOTE — Assessment & Plan Note (Signed)
Focused on nutrition advice

## 2012-09-02 ENCOUNTER — Other Ambulatory Visit: Payer: Self-pay | Admitting: Infectious Disease

## 2012-09-02 ENCOUNTER — Other Ambulatory Visit: Payer: Medicaid Other

## 2012-09-02 DIAGNOSIS — B20 Human immunodeficiency virus [HIV] disease: Secondary | ICD-10-CM

## 2012-09-02 LAB — RPR

## 2012-09-02 LAB — COMPLETE METABOLIC PANEL WITH GFR
Albumin: 3.6 g/dL (ref 3.5–5.2)
Alkaline Phosphatase: 55 U/L (ref 39–117)
BUN: 9 mg/dL (ref 6–23)
CO2: 29 mEq/L (ref 19–32)
GFR, Est African American: >89 mL/min
GFR, Est Non African American: >89 mL/min
Glucose, Bld: 82 mg/dL (ref 70–99)
Potassium: 3.7 mEq/L (ref 3.5–5.3)

## 2012-09-02 LAB — CBC WITH DIFFERENTIAL/PLATELET
Basophils Relative: 0 % (ref 0–1)
Eosinophils Absolute: 0 10*3/uL (ref 0.0–0.7)
HCT: 36.2 % (ref 36.0–46.0)
Hemoglobin: 11.8 g/dL — ABNORMAL LOW (ref 12.0–15.0)
Lymphs Abs: 1.5 10*3/uL (ref 0.7–4.0)
MCH: 28.3 pg (ref 26.0–34.0)
MCHC: 32.6 g/dL (ref 30.0–36.0)
Monocytes Absolute: 0.3 10*3/uL (ref 0.1–1.0)
Monocytes Relative: 11 % (ref 3–12)
Neutro Abs: 0.7 10*3/uL — ABNORMAL LOW (ref 1.7–7.7)

## 2012-09-04 ENCOUNTER — Telehealth: Payer: Self-pay | Admitting: Infectious Disease

## 2012-09-04 DIAGNOSIS — B20 Human immunodeficiency virus [HIV] disease: Secondary | ICD-10-CM

## 2012-09-04 NOTE — Telephone Encounter (Signed)
Leslie Gates can we add an HIV genotype to her labs

## 2012-09-07 LAB — HIV-1 GENOTYPR PLUS

## 2012-09-22 ENCOUNTER — Encounter: Payer: Self-pay | Admitting: Internal Medicine

## 2012-09-22 ENCOUNTER — Ambulatory Visit (INDEPENDENT_AMBULATORY_CARE_PROVIDER_SITE_OTHER): Payer: Medicaid Other | Admitting: Internal Medicine

## 2012-09-22 VITALS — BP 129/87 | HR 71 | Temp 98.2°F | Wt 185.0 lb

## 2012-09-22 DIAGNOSIS — H10219 Acute toxic conjunctivitis, unspecified eye: Secondary | ICD-10-CM

## 2012-09-22 DIAGNOSIS — B20 Human immunodeficiency virus [HIV] disease: Secondary | ICD-10-CM

## 2012-09-22 DIAGNOSIS — Z21 Asymptomatic human immunodeficiency virus [HIV] infection status: Secondary | ICD-10-CM

## 2012-09-22 DIAGNOSIS — L299 Pruritus, unspecified: Secondary | ICD-10-CM

## 2012-09-22 MED ORDER — NAPHAZOLINE-PHENIRAMINE 0.025-0.3 % OP SOLN: 1.0000 [drp] | Freq: Four times a day (QID) | OPHTHALMIC | Status: DC | PRN

## 2012-09-22 MED ORDER — ELVITEG-COBIC-EMTRICIT-TENOFDF 150-150-200-300 MG PO TABS: 1.0000 | ORAL_TABLET | Freq: Every day | ORAL | Status: DC

## 2012-09-22 MED ORDER — CETIRIZINE HCL 10 MG PO TABS: 10.0000 mg | ORAL_TABLET | Freq: Every day | ORAL | Status: DC

## 2012-09-22 NOTE — Progress Notes (Signed)
HIV CLINIC NOTE  RFV: re-establishing care Subjective:    Patient ID: Leslie Gates, female    DOB: [birth year], 46 y.o.   MRN: [id number]  HPI 46 yo F with HIV, diagnosed in 2003,  CD 4 count 220, VL 49,000, off of therapy for 6 months, previously taking truvada and etravirine. Also has HTN, pulm htn, OSA. She states that she has been out of care for the last 12 months, but stopped her HIV meds. Towards the end of her last month of medications, she was spacing out her cART in order to prolonged them. She is motivated to start back on medications. She is hoping to have a single tablet regimen since it is difficult for her to swallow pills.   Roughly 3 days ago, she splashed bleach like solution into her eyes, for which her left eye is still bothering her, and she is taking eye drops that have improved her symptoms, but not completely resolved.  Allergies  Allergen Reactions  . Lisinopril Swelling    Very swollen bottom lip     Current Outpatient Prescriptions on File Prior to Visit  Medication Sig Dispense Refill  . albuterol (PROVENTIL HFA;VENTOLIN HFA) 108 (90 BASE) MCG/ACT inhaler Inhale 1 puff into the lungs every 6 (six) hours as needed. For wheezing or shortness breath      . amLODipine (NORVASC) 10 MG tablet Take 1 tablet by mouth daily.  90 tablet  21  . cetirizine (ZYRTEC) 10 MG tablet Take 10 mg by mouth daily.      . furosemide (LASIX) 20 MG tablet Take 20 mg by mouth daily.       . INTELENCE 200 MG TABS Take 2 tablets by mouth every day  60 tablet  4  . loratadine (CLARITIN) 10 MG tablet Take 1 tablet (10 mg total) by mouth daily.  30 tablet  4  . Multiple Vitamin (MULTIVITAMIN) tablet TAKE ONE TABLET BY MOUTH DAILY  30 tablet  6  . spironolactone (ALDACTONE) 25 MG tablet Take 25 mg by mouth daily.       Marland Kitchen sulfamethoxazole-trimethoprim (BACTRIM DS) 800-160 MG per tablet Take 1 tablet by mouth 3 (three) times a week.  20 tablet  4  . TRUVADA 200-300 MG per tablet Take 1 tablet  by mouth daily.  30 tablet  4   Social hx: takes care of her 68 yo son. Stay at home.  AO:ZHYQMV history includes Hypertension in an unspecified family member; Other in her father; and Other (age of onset:52) in her mother.   Review of Systems  Constitutional: Negative for fever, chills, diaphoresis, activity change, appetite change, fatigue and unexpected weight change.  HENT: _+ sinusitis Negative for congestion, sore throat, rhinorrhea, sneezing, trouble swallowing and sinus pressure.  Eyes: positive for left eye slightly burning. No visual deficits Negative for photophobia and visual disturbance.  Respiratory: Negative for cough, chest tightness, shortness of breath, wheezing and stridor.  Cardiovascular: Negative for chest pain, palpitations and leg swelling.  Gastrointestinal: Negative for nausea, vomiting, abdominal pain, diarrhea, constipation, blood in stool, abdominal distention and anal bleeding.  Genitourinary: Negative for dysuria, hematuria, flank pain and difficulty urinating.  Musculoskeletal: Negative for myalgias, back pain, joint swelling, arthralgias and gait problem.  Skin: Negative for color change, pallor, rash and wound.  Neurological: Negative for dizziness, tremors, weakness and light-headedness.  Hematological: Negative for adenopathy. Does not bruise/bleed easily.  Psychiatric/Behavioral: Negative for behavioral problems, confusion, sleep disturbance, dysphoric mood, decreased concentration and agitation.  Objective:   Physical Exam  BP 129/87  Pulse 71  Temp 98.2 F (36.8 C) (Oral)  Wt 185 lb (83.915 kg) Physical Exam  Constitutional:  oriented to person, place, and time.  appears well-developed and well-nourished. No distress.  HENT: left eye injected conjunctiva Mouth/Throat: Oropharynx is clear and moist. No oropharyngeal exudate.  Cardiovascular: Normal rate, regular rhythm and normal heart sounds. Exam reveals no gallop and no friction rub.    No murmur heard.  Pulmonary/Chest: Effort normal and breath sounds normal. No respiratory distress. He has no wheezes.  Lymphadenopathy:  no cervical adenopathy.  Neurological: He is alert and oriented to person, place, and time.  Skin: Skin is warm and dry. No rash noted. No erythema.       Assessment & Plan:  HIV = will start stribild, 1 tab daily; will have her come back in 4 wks to check labs and see how she is doing with adherence. She is able to crush stribild if she needs to.  Chemical conjunctivitis = will re prescribe her eye drops. Instructed her to go to eye doctor if no improvement in 3-4 days.  seasonal allergies= will re-prescribe zyrtec  Health maintenance= already had flu vax on 10/1. Will schedule pap at beginning of the year.    rtc in 4 wks

## 2012-10-04 ENCOUNTER — Telehealth: Payer: Self-pay | Admitting: Licensed Clinical Social Worker

## 2012-10-04 NOTE — Telephone Encounter (Signed)
She can start trying a dose or 2 of immodium for the diarrhea. Will try to call her at the end of the day to speak with her. Try to encourage her to not miss any doses of the stribild

## 2012-10-04 NOTE — Telephone Encounter (Signed)
I called the patient she did not answer, and I did not get a voicemail.

## 2012-10-04 NOTE — Telephone Encounter (Signed)
Patient is having problems with diarrhea, and lack of appetite since starting stribild. She is having 4 to 5 diarrheal stools daily, fatigue, and pains in her stomach. Please advise

## 2012-10-24 ENCOUNTER — Other Ambulatory Visit: Payer: Medicaid Other

## 2012-11-17 ENCOUNTER — Ambulatory Visit: Payer: Medicaid Other | Admitting: Internal Medicine

## 2012-11-22 ENCOUNTER — Encounter (HOSPITAL_COMMUNITY): Payer: Self-pay | Admitting: Cardiology

## 2012-11-22 ENCOUNTER — Telehealth (HOSPITAL_COMMUNITY): Payer: Self-pay | Admitting: Cardiology

## 2012-11-22 NOTE — Telephone Encounter (Signed)
Attempting to contact pt regarding follow up appointment dun in Jan 2014. Letter mailed

## 2012-11-24 ENCOUNTER — Other Ambulatory Visit: Payer: Medicaid Other

## 2012-11-28 ENCOUNTER — Other Ambulatory Visit: Payer: Medicaid Other

## 2012-11-28 DIAGNOSIS — B20 Human immunodeficiency virus [HIV] disease: Secondary | ICD-10-CM

## 2012-11-28 LAB — CBC WITH DIFFERENTIAL/PLATELET
Eosinophils Absolute: 0 10*3/uL (ref 0.0–0.7)
Eosinophils Relative: 1 % (ref 0–5)
HCT: 37.6 % (ref 36.0–46.0)
Hemoglobin: 12.9 g/dL (ref 12.0–15.0)
Lymphocytes Relative: 47 % — ABNORMAL HIGH (ref 12–46)
Lymphs Abs: 1.3 10*3/uL (ref 0.7–4.0)
MCH: 28.5 pg (ref 26.0–34.0)
MCV: 83.2 fL (ref 78.0–100.0)
Monocytes Absolute: 0.3 10*3/uL (ref 0.1–1.0)
Monocytes Relative: 10 % (ref 3–12)
Platelets: 139 10*3/uL — ABNORMAL LOW (ref 150–400)
RBC: 4.52 MIL/uL (ref 3.87–5.11)

## 2012-11-28 LAB — COMPLETE METABOLIC PANEL WITH GFR
Albumin: 4.1 g/dL (ref 3.5–5.2)
BUN: 11 mg/dL (ref 6–23)
CO2: 30 mEq/L (ref 19–32)
Calcium: 9.6 mg/dL (ref 8.4–10.5)
Chloride: 101 mEq/L (ref 96–112)
GFR, Est Non African American: >89 mL/min
Glucose, Bld: 82 mg/dL (ref 70–99)
Potassium: 3.9 mEq/L (ref 3.5–5.3)
Sodium: 137 mEq/L (ref 135–145)
Total Protein: 8.8 g/dL — ABNORMAL HIGH (ref 6.0–8.3)

## 2012-11-29 LAB — T-HELPER CELL (CD4) - (RCID CLINIC ONLY): CD4 T Cell Abs: 210 uL — ABNORMAL LOW (ref 400–2700)

## 2012-12-13 ENCOUNTER — Ambulatory Visit: Payer: Medicaid Other | Admitting: Internal Medicine

## 2012-12-15 ENCOUNTER — Other Ambulatory Visit: Payer: Self-pay | Admitting: Internal Medicine

## 2012-12-27 ENCOUNTER — Ambulatory Visit (INDEPENDENT_AMBULATORY_CARE_PROVIDER_SITE_OTHER): Payer: Medicaid Other | Admitting: Internal Medicine

## 2012-12-27 ENCOUNTER — Encounter: Payer: Self-pay | Admitting: Internal Medicine

## 2012-12-27 VITALS — BP 128/84 | HR 85 | Temp 97.7°F | Ht 61.0 in | Wt 187.0 lb

## 2012-12-27 DIAGNOSIS — B20 Human immunodeficiency virus [HIV] disease: Secondary | ICD-10-CM

## 2012-12-27 DIAGNOSIS — Z23 Encounter for immunization: Secondary | ICD-10-CM

## 2012-12-27 DIAGNOSIS — J811 Chronic pulmonary edema: Secondary | ICD-10-CM

## 2012-12-27 DIAGNOSIS — I10 Essential (primary) hypertension: Secondary | ICD-10-CM

## 2012-12-27 DIAGNOSIS — R110 Nausea: Secondary | ICD-10-CM

## 2012-12-27 MED ORDER — DARUNAVIR ETHANOLATE 800 MG PO TABS: 800.0000 mg | ORAL_TABLET | Freq: Every day | ORAL | Status: DC

## 2012-12-27 MED ORDER — GUAIFENESIN ER 600 MG PO TB12: 600.0000 mg | ORAL_TABLET | Freq: Two times a day (BID) | ORAL | Status: DC

## 2012-12-27 MED ORDER — ONDANSETRON HCL 4 MG PO TABS: 4.0000 mg | ORAL_TABLET | Freq: Two times a day (BID) | ORAL | Status: DC | PRN

## 2012-12-27 MED ORDER — SODIUM CHLORIDE 0.65 % NA SOLN: 1.0000 | NASAL | Status: DC | PRN

## 2012-12-27 MED ORDER — EMTRICITABINE-TENOFOVIR DF 200-300 MG PO TABS: 1.0000 | ORAL_TABLET | Freq: Every day | ORAL | Status: DC

## 2012-12-27 MED ORDER — RITONAVIR 100 MG PO TABS: 100.0000 mg | ORAL_TABLET | Freq: Every day | ORAL | Status: DC

## 2012-12-27 NOTE — Progress Notes (Signed)
[facility] NOTE  RFV: routine visit Subjective:    Patient ID: Leslie Gates, female    DOB: August 16, 1966, 47 y.o.   MRN: [id number]  HPI 47 yo F with HIV, CD 4 count 210/VL 19,602, on stribild, having significant nausea and diarrhea. Lost appetite. Still felt poorly after eating. Tried it for about a month, but stopped right before Jan 1st. She states that she is worried about taking hiv meds since the nausea is bad, thinks she may have some pill aversion. No fever, chills, nightsweats, cough, diarrhea after stopping stribild.  Current Outpatient Prescriptions on File Prior to Visit  Medication Sig Dispense Refill  . amLODipine (NORVASC) 10 MG tablet Take 1 tablet by mouth daily.  90 tablet  21  . cetirizine (ZYRTEC) 10 MG tablet Take 1 tablet (10 mg total) by mouth daily.  30 tablet  11  . elvitegravir-cobicistat-emtricitabine-tenofovir (STRIBILD) 150-150-200-300 MG TABS Take 1 tablet by mouth daily with breakfast.  30 tablet  11  . furosemide (LASIX) 20 MG tablet Take 20 mg by mouth daily.       Marland Kitchen loratadine (CLARITIN) 10 MG tablet Take 1 tablet (10 mg total) by mouth daily.  30 tablet  4  . Multiple Vitamin (MULTIVITAMIN) tablet TAKE ONE TABLET BY MOUTH DAILY  30 tablet  6  . naphazoline-pheniramine (NAPHCON-A) 0.025-0.3 % ophthalmic solution Place 1 drop into the left eye 4 (four) times daily as needed.  15 mL  1  . PROAIR HFA 108 (90 BASE) MCG/ACT inhaler Inhale 1 puff into the lungs every 6 (six) hours as needed.  8.5 g  5  . spironolactone (ALDACTONE) 25 MG tablet Take 25 mg by mouth daily.        No current facility-administered medications on file prior to visit.   Active Ambulatory Problems    Diagnosis Date Noted  . HIV DISEASE 06/21/2002  . HERPES ZOSTER 09/15/2007  . Obesity (BMI 30.0-34.9) 07/01/2010  . OBSTRUCTIVE SLEEP APNEA 06/27/2010  . SLEEP RELATED HYPOVENTILATION/HYPOXEMIA CCE 07/10/2010  . OTITIS MEDIA, CHRONIC 03/22/2007  . HYPERTENSION 03/22/2007  .  HYPERTENSION, PULMONARY 05/28/2010  . ALLERGIC RHINITIS CAUSE UNSPECIFIED 03/14/2008  . DYSPLASIA OF CERVIX UNSPECIFIED 12/19/2007  . SHORTNESS OF BREATH 05/08/2010  . Acute bronchitis 04/10/2011  . Rash 08/12/2011  . Angioedema of lips 09/09/2011  . Chemical conjunctivitis 09/22/2012   Resolved Ambulatory Problems    Diagnosis Date Noted  . No Resolved Ambulatory Problems   Past Medical History  Diagnosis Date  . Pulmonary HTN   . OSA (obstructive sleep apnea)   . Diastolic CHF, chronic   . HTN (hypertension)   . Human immunodeficiency virus (HIV) 2001  . Wrist fracture, bilateral   . History of herpes zoster   . History of cervical dysplasia   . Dysphagia   . Chronic otitis media   . Allergic rhinitis   . Obesity   . Coronary artery disease        Review of Systems Nasal congestion    Objective:   Physical Exam BP 128/84  Pulse 85  Temp(Src) 97.7 F (36.5 C) (Oral)  Ht 5\' 1"  (1.549 m)  Wt 187 lb (84.823 kg)  BMI 35.35 kg/m2  LMP 12/26/2012 Physical Exam  Constitutional: oriented to person, place, and time.  appears well-developed and well-nourished. No distress.  HENT: [place]/AT,  Mouth/Throat: Oropharynx is clear and moist. No oropharyngeal exudate.  Cardiovascular: Normal rate, regular rhythm and normal heart sounds. Exam reveals no gallop and no  friction rub. No murmur heard.  Pulmonary/Chest: Effort normal and breath sounds normal. No respiratory distress.  no wheezes.  Abdominal: Soft. Bowel sounds are normal.exhibits no distension. There is no tenderness.  Lymphadenopathy:  no cervical adenopathy.  Skin: Skin is warm and dry. No rash noted. No erythema.       Assessment & Plan:  HIV = will abandon using stribild due to side effects. Will switch regimen - > truvada/darunavir/ritonavir once a day. Will pretreat with anti-emetic for the first 10 days.  Nausea = zofran 4mg  PRN trial  htn = continue with current regimen.  Health maintenance = need  pneumococcal vaccine

## 2013-01-24 ENCOUNTER — Ambulatory Visit: Payer: Medicaid Other | Admitting: Internal Medicine

## 2013-01-31 ENCOUNTER — Ambulatory Visit: Payer: Medicaid Other | Admitting: Internal Medicine

## 2013-01-31 ENCOUNTER — Ambulatory Visit (HOSPITAL_COMMUNITY): Payer: Medicaid Other | Attending: Internal Medicine

## 2013-03-01 ENCOUNTER — Telehealth: Payer: Self-pay | Admitting: *Deleted

## 2013-03-01 MED ORDER — ONE-DAILY MULTI VITAMINS PO TABS: 1.0000 | ORAL_TABLET | Freq: Every day | ORAL | Status: DC

## 2013-03-01 NOTE — Telephone Encounter (Signed)
Requesting refill

## 2013-03-06 ENCOUNTER — Encounter: Payer: Self-pay | Admitting: *Deleted

## 2013-03-07 ENCOUNTER — Ambulatory Visit (INDEPENDENT_AMBULATORY_CARE_PROVIDER_SITE_OTHER): Payer: Medicaid Other | Admitting: Internal Medicine

## 2013-03-07 ENCOUNTER — Encounter: Payer: Self-pay | Admitting: Internal Medicine

## 2013-03-07 VITALS — BP 130/88 | HR 85 | Temp 98.3°F | Ht 62.0 in | Wt 178.0 lb

## 2013-03-07 DIAGNOSIS — R0600 Dyspnea, unspecified: Secondary | ICD-10-CM

## 2013-03-07 DIAGNOSIS — R0602 Shortness of breath: Secondary | ICD-10-CM

## 2013-03-07 DIAGNOSIS — R0989 Other specified symptoms and signs involving the circulatory and respiratory systems: Secondary | ICD-10-CM

## 2013-03-07 NOTE — Progress Notes (Signed)
Subjective:    Patient ID: Leslie Gates, female    DOB: 06/12/1966, 47 y.o.   MRN: [id number]  HPI #HIV - since 2000  - non compliant; last ID visit fall 2012 - truvada/darunavir/ritonavir once a day - feb 2014, now compliant   #Obesity - BMI 34.22 Aug 2011  - Body mass index is 34.02 kg/(m^2). - Oct 2013 - Body mass index is 32.55 kg/(m^2). on 03/07/2013    #OSA. -> Dr Craige Cotta  - since 2003  - Dr Craige Cotta since August 2011  - Nov 2011: DR Craige Cotta has noticed that CPAP was not working and changed to auto BIPAP 25-5  - complant with bipap as of oct 2013  #Diastolic Dysfunction  - Has  ECHO June 2011 with Grade 2 diast dysfunction  #Pulmonary Hypertension - Rt heart cath on 06/23/2010. PA meanis 60, PCWP is 25-30, TRANPULM GRADIENT - 30, CI is 2.3L, and PVR is 7.1 Woods Unit.  - Spironlactone was started. AFter this dyspnea and edema has improved - RHC 02/01/12:  -Findings: RA = 6 , RV = 39/4/8 , PA = 42/14 (25) , PCW = 9 , Fick cardiac output/index = 6.2/3.2 , PVR = 2.5 Woods , FA sat = 96%  PA sat = 72%, 73% . Assessment: 1. Minimal PAH with almost complete resolution of pulmonary HTN. 2. Well compensated left-side pressures  Plan/Discussion:  Continue current therapy.   #Dyspnea (multifactorial)   - Cass 3 dyspnea with desaturation at 185 feet of walkingon room air in July 2011.  -  PFTs Spirometry June 2011 suggestive of restriction,   - CT Chest June 201 without evidence of PE or parencymal disease.   - ABG on room air from May 13, 2010: pH 7.42, PaCO2 56, PaO2 59.  - Jan 2012: Still with class 3 dyspnea. Desaturated to 88% after walking 155 feet x 2 laps but not using o2 at night    OV 08/30/2012  1) HIV - not taking HAART. Says it always makes her nauseated and vomiting and poor appetite. Not seen ID > 1 year. Stopped ART Rx > 6 months ago. Looking for god's will. I asked her if HAART Rx is god's will. She agreed it is god's will but feels god should have medications without  side effects and god has mvt and herbs as options as well. She also wants to change to a female staff ID member if possible. She says she is compliant with other medications but only has issues with HIV medications and related side effects  2) Dyspnea: improved. RHC march 2013 significantly better. Using bipap. She is compliant with lasix, and aldactone however. She is trying to lose weight but agrees pace of weight loss is not good. Current Estimated Body mass index is 34.02 kg/(m^2) as calculated from the following:   Height as of this encounter: 5\' 2" (1.575 m).   Weight as of this encounter: 186 lb(84.369 kg). She feels her ethnicity is counter=productive for her weight loss due to dietary habits of african americans  Had RHC March 2013 and shows PAP is at cusp of normal to eleavated to 25 but with normal PVR and PCWP (see below). DR Bensimohn plans to continue current medical RX   Past, Family, Social reviewed: as above   Glad you are doing better  Continue medications and bipap  Follow weight loss diet sheet I gave you along with instructions below  I will see if Dr Drue Second will accept you in ID clinic  Return in 6 months - lose 6 pounds at least in 6 months    OV 03/07/2013 Body mass index is 32.55 kg/(m^2).  Doing well. Has lost weight intentionally. Following at HIV clinic and taking HAART medications. No major respiratory issues. No interim problems   Review of Systems  Constitutional: Negative for fever and unexpected weight change.  HENT: Negative for ear pain, nosebleeds, congestion, sore throat, rhinorrhea, sneezing, trouble swallowing, dental problem, postnasal drip and sinus pressure.   Eyes: Negative for redness and itching.  Respiratory: Negative for cough, chest tightness, shortness of breath and wheezing.   Cardiovascular: Negative for palpitations and leg swelling.  Gastrointestinal: Negative for nausea and vomiting.  Genitourinary: Negative for dysuria.   Musculoskeletal: Negative for joint swelling.  Skin: Negative for rash.  Neurological: Negative for headaches.  Hematological: Does not bruise/bleed easily.  Psychiatric/Behavioral: Negative for dysphoric mood. The patient is not nervous/anxious.    Current outpatient prescriptions:amLODipine (NORVASC) 10 MG tablet, Take 1 tablet by mouth daily., Disp: 90 tablet, Rfl: 21;  cetirizine (ZYRTEC) 10 MG tablet, Take 1 tablet (10 mg total) by mouth daily., Disp: 30 tablet, Rfl: 11;  Darunavir Ethanolate (PREZISTA) 800 MG tablet, Take 1 tablet (800 mg total) by mouth daily with breakfast., Disp: 30 tablet, Rfl: 11 emtricitabine-tenofovir (TRUVADA) 200-300 MG per tablet, Take 1 tablet by mouth daily., Disp: 30 tablet, Rfl: 11;  furosemide (LASIX) 20 MG tablet, Take 20 mg by mouth daily. , Disp: , Rfl: ;  guaiFENesin (MUCINEX) 600 MG 12 hr tablet, Take 1 tablet (600 mg total) by mouth 2 (two) times daily., Disp: 30 tablet, Rfl: 0;  loratadine (CLARITIN) 10 MG tablet, Take 1 tablet (10 mg total) by mouth daily., Disp: 30 tablet, Rfl: 4 Multiple Vitamin (MULTIVITAMIN) tablet, Take 1 tablet by mouth daily., Disp: 30 tablet, Rfl: 6;  naphazoline-pheniramine (NAPHCON-A) 0.025-0.3 % ophthalmic solution, Place 1 drop into the left eye 4 (four) times daily as needed., Disp: 15 mL, Rfl: 1;  ondansetron (ZOFRAN) 4 MG tablet, Take 1 tablet (4 mg total) by mouth every 12 (twelve) hours as needed for nausea., Disp: 30 tablet, Rfl: 3 PROAIR HFA 108 (90 BASE) MCG/ACT inhaler, Inhale 1 puff into the lungs every 6 (six) hours as needed., Disp: 8.5 g, Rfl: 5;  ritonavir (NORVIR) 100 MG TABS, Take 1 tablet (100 mg total) by mouth daily with breakfast., Disp: 30 tablet, Rfl: 11;  sodium chloride (OCEAN) 0.65 % nasal spray, Place 1 spray into the nose as needed for congestion., Disp: 15 mL, Rfl: 12;  spironolactone (ALDACTONE) 25 MG tablet, Take 25 mg by mouth daily. , Disp: , Rfl:      Objective:   Physical Exam GEN: A/Ox3;  pleasant , NAD, obese   HEENT:  [place]/AT,  EACs-clear, TMs-wnl, NOSE-clear, THROAT-clear, no lesions, no postnasal drip or exudate noted.   NECK:  Supple w/ fair ROM; no JVD; normal carotid impulses w/o bruits; no thyromegaly or nodules palpated; no lymphadenopathy.  RESP  Coarse BS w/ no wheezing no accessory muscle use, no dullness to percussion  CARD:  RRR, no m/r/g  , no peripheral edema, pulses intact, no cyanosis or clubbing.  GI:   Soft & nt; nml bowel sounds; no organomegaly or masses detected.  Musco: Warm bil, no deformities or joint swelling noted.   Neuro: alert, no focal deficits noted.    Skin: Warm, no lesions or rashes            Assessment & Plan:

## 2013-03-07 NOTE — Patient Instructions (Addendum)
Glad you are doing well Conitnue focusing on nutrition related weight loss REturn to see me 9 months Full PFT at followup

## 2013-03-11 NOTE — Assessment & Plan Note (Signed)
Glad you are doing well Conitnue focusing on nutrition related weight loss REturn to see me 9 months Full PFT at followup 

## 2013-03-13 ENCOUNTER — Ambulatory Visit (HOSPITAL_COMMUNITY): Admission: RE | Admit: 2013-03-13 | Payer: Medicaid Other | Source: Ambulatory Visit

## 2013-05-04 ENCOUNTER — Encounter (HOSPITAL_COMMUNITY): Payer: Self-pay

## 2013-05-04 ENCOUNTER — Ambulatory Visit (HOSPITAL_COMMUNITY)
Admission: RE | Admit: 2013-05-04 | Discharge: 2013-05-04 | Disposition: A | Discharge status: Home / Self Care | Payer: Medicaid Other | Source: Ambulatory Visit | Attending: Internal Medicine | Admitting: Internal Medicine

## 2013-05-04 VITALS — BP 120/80 | HR 80 | Ht 62.0 in | Wt 170.0 lb

## 2013-05-04 DIAGNOSIS — Z21 Asymptomatic human immunodeficiency virus [HIV] infection status: Secondary | ICD-10-CM | POA: Insufficient documentation

## 2013-05-04 DIAGNOSIS — Z79899 Other long term (current) drug therapy: Secondary | ICD-10-CM | POA: Insufficient documentation

## 2013-05-04 DIAGNOSIS — G4733 Obstructive sleep apnea (adult) (pediatric): Secondary | ICD-10-CM | POA: Insufficient documentation

## 2013-05-04 DIAGNOSIS — I2789 Other specified pulmonary heart diseases: Secondary | ICD-10-CM | POA: Insufficient documentation

## 2013-05-04 DIAGNOSIS — I10 Essential (primary) hypertension: Secondary | ICD-10-CM | POA: Insufficient documentation

## 2013-05-04 DIAGNOSIS — E669 Obesity, unspecified: Secondary | ICD-10-CM | POA: Insufficient documentation

## 2013-05-04 DIAGNOSIS — I272 Pulmonary hypertension, unspecified: Secondary | ICD-10-CM

## 2013-05-04 NOTE — Progress Notes (Signed)
Patient ID: [name], female   DOB: 1966-11-11, 47 y.o.   MRN: [id number] Pulmonologist: Dr Marchelle Gearing ID: Dr Drue Second HPI:  Ms. Trudel is a 47 year old woman with history of obesity, hypertension, HIV, and obstructive sleep apnea who was found to have pulmonary hypertension with RV dysfunction on echocardiogram.     RHC on August 2011.  RA 21, RV pressure 78/39/21.  Pulmonary artery pressure 84/44 (60). PCWP 25-30.  Fick  4.2 L/2.3 Pulmonary vascular resistance was 7.1 Woods units.  She was started on spiro for her elevated L heart pressures but we also suggested the possibility of Adcirca as a pulmonary vasodilator .   She had a nuclear stress in January 2012 that was normal with EF 68%.  09/2011 EF 60 %.RV mildly dilated minimal hyopkenesis. No TR jet.  02/01/12: RHC RA = 6  RV = 39/4/8  PA = 42/14 (25)  PCW = 9  Fick cardiac output/index = 6.2/3.2  PVR = 2.5 Woods  FA sat = 96%  PA sat = 72%, 73%  She returns for follow up today.   Overall she feels good. Denies SOB/PND/Orthopnea.  Does say she is SOB in the heat. Weight at home 170 pounds. Says her weight is going down.  She is weighing every now and then.  Wears O2 at 2L when she is short of breath. Continues to use CPAP.  Compliant with medications. She continues to follow with Dr Marchelle Gearing with PFTs at next OV.      ROS: All other systems normal except as mentioned in HPI, past medical history and problem list.    Past Medical History  Diagnosis Date  . Pulmonary HTN   . OSA (obstructive sleep apnea)   . Diastolic CHF, chronic   . HTN (hypertension)   . Human immunodeficiency virus (HIV) 2001  . Wrist fracture, bilateral   . History of herpes zoster   . History of cervical dysplasia   . Dysphagia   . Chronic otitis media   . Allergic rhinitis   . Obesity   . Coronary artery disease     Current Outpatient Prescriptions  Medication Sig Dispense Refill  . amLODipine (NORVASC) 10 MG tablet Take 1 tablet by mouth  daily.  90 tablet  21  . cetirizine (ZYRTEC) 10 MG tablet Take 1 tablet (10 mg total) by mouth daily.  30 tablet  11  . emtricitabine-tenofovir (TRUVADA) 200-300 MG per tablet Take 1 tablet by mouth daily.  30 tablet  11  . furosemide (LASIX) 20 MG tablet Take 20 mg by mouth daily.       Marland Kitchen guaiFENesin (MUCINEX) 600 MG 12 hr tablet Take 1 tablet (600 mg total) by mouth 2 (two) times daily.  30 tablet  0  . loratadine (CLARITIN) 10 MG tablet Take 1 tablet (10 mg total) by mouth daily.  30 tablet  4  . Multiple Vitamin (MULTIVITAMIN) tablet Take 1 tablet by mouth daily.  30 tablet  6  . PROAIR HFA 108 (90 BASE) MCG/ACT inhaler Inhale 1 puff into the lungs every 6 (six) hours as needed.  8.5 g  5  . ritonavir (NORVIR) 100 MG TABS Take 1 tablet (100 mg total) by mouth daily with breakfast.  30 tablet  11  . spironolactone (ALDACTONE) 25 MG tablet Take 25 mg by mouth daily.       . Darunavir Ethanolate (PREZISTA) 800 MG tablet Take 1 tablet (800 mg total) by mouth daily with breakfast.  30 tablet  11  . ondansetron (ZOFRAN) 4 MG tablet Take 1 tablet (4 mg total) by mouth every 12 (twelve) hours as needed for nausea.  30 tablet  3  . sodium chloride (OCEAN) 0.65 % nasal spray Place 1 spray into the nose as needed for congestion.  15 mL  12   No current facility-administered medications for this encounter.    Allergies  Allergen Reactions  . Lisinopril Swelling    Very swollen bottom lip   PHYSICAL EXAM: Filed Vitals:   05/04/13 1121  BP: 120/80  Pulse: 80  Height: 5\' 2"  (1.575 m)  Weight: 170 lb (77.111 kg)  SpO2: 95%    General:  Well appearing. No respiratory difficulty  HEENT: normal Neck: supple. JVP flat. Carotids 2+ bilat; no bruits. No lymphadenopathy or thryomegaly appreciated. Cor: PMI nondisplaced. Regular rate & rhythm. No rubs, gallops or murmurs. Lungs: clear Abdomen: obese, soft, nontender, nondistended. No hepatosplenomegaly. No bruits or masses. Good bowel  sounds. Extremities: no cyanosis, clubbing, rash, edema Neuro: alert & oriented x 3, cranial nerves grossly intact. moves all 4 extremities w/o difficulty. Affect pleasant.    ASSESSMENT & PLAN:

## 2013-05-04 NOTE — Assessment & Plan Note (Signed)
Volume status stable. Continue oxygen as needed and CPAP. Follow up in 3 months with ECHO and Dr Gala Romney

## 2013-05-04 NOTE — Patient Instructions (Addendum)
Follow up in 3 months with an ECHO 

## 2013-05-04 NOTE — Assessment & Plan Note (Signed)
Continue CPAP as ordered 

## 2013-05-18 ENCOUNTER — Ambulatory Visit: Payer: Self-pay | Admitting: Obstetrics

## 2013-05-22 ENCOUNTER — Encounter: Payer: Self-pay | Admitting: Obstetrics

## 2013-05-22 ENCOUNTER — Ambulatory Visit (INDEPENDENT_AMBULATORY_CARE_PROVIDER_SITE_OTHER): Payer: Medicaid Other | Admitting: Obstetrics

## 2013-05-22 ENCOUNTER — Other Ambulatory Visit: Payer: Self-pay | Admitting: *Deleted

## 2013-05-22 VITALS — BP 126/86 | HR 83 | Temp 98.4°F | Ht 63.0 in | Wt 169.2 lb

## 2013-05-22 DIAGNOSIS — Z0000 Encounter for general adult medical examination without abnormal findings: Secondary | ICD-10-CM

## 2013-05-22 DIAGNOSIS — Z113 Encounter for screening for infections with a predominantly sexual mode of transmission: Secondary | ICD-10-CM

## 2013-05-22 DIAGNOSIS — N760 Acute vaginitis: Secondary | ICD-10-CM

## 2013-05-22 DIAGNOSIS — Z79899 Other long term (current) drug therapy: Secondary | ICD-10-CM

## 2013-05-22 DIAGNOSIS — B20 Human immunodeficiency virus [HIV] disease: Secondary | ICD-10-CM

## 2013-05-22 NOTE — Addendum Note (Signed)
Addended by: Julaine Hua on: 05/22/2013 05:07 PM   Modules accepted: Orders

## 2013-05-22 NOTE — Progress Notes (Signed)
.   Subjective:     Leslie Gates is a 47 y.o. female here for a routine exam.  No current complaints.  Personal health questionnaire reviewed: yes.   Gynecologic History No LMP recorded. Contraception: none Last Pap: 02/2011.  Results were: LSIL.  Colpo 04/22/2011 Last mammogram: 2014. Results were: normal  Obstetric History OB History   Grav Para Term Preterm Abortions TAB SAB Ect Mult Living                   The following portions of the patient's history were reviewed and updated as appropriate: allergies, current medications, past family history, past medical history, past social history, past surgical history and problem list.  Review of Systems Pertinent items are noted in HPI.    Objective:    General appearance: alert and no distress Breasts: normal appearance, no masses or tenderness Abdomen: normal findings: soft, non-tender Pelvic: cervix normal in appearance, external genitalia normal, no adnexal masses or tenderness, no cervical motion tenderness, uterus normal size, shape, and consistency and vagina normal without discharge    Assessment:    Healthy female exam.    Plan:    Follow up in: 1 year.

## 2013-05-23 ENCOUNTER — Other Ambulatory Visit: Payer: Medicaid Other

## 2013-05-23 DIAGNOSIS — B20 Human immunodeficiency virus [HIV] disease: Secondary | ICD-10-CM

## 2013-05-23 DIAGNOSIS — Z79899 Other long term (current) drug therapy: Secondary | ICD-10-CM

## 2013-05-23 LAB — LIPID PANEL
HDL: 41 mg/dL (ref >39)
LDL Cholesterol: 90 mg/dL (ref 0–99)
Triglycerides: 112 mg/dL (ref <150)
VLDL: 22 mg/dL (ref 0–40)

## 2013-05-23 LAB — GC/CHLAMYDIA PROBE AMP: CT Probe RNA: NEGATIVE

## 2013-05-23 LAB — CBC WITH DIFFERENTIAL/PLATELET
Basophils Absolute: 0 10*3/uL (ref 0.0–0.1)
Lymphocytes Relative: 44 % (ref 12–46)
Neutro Abs: 1.2 10*3/uL — ABNORMAL LOW (ref 1.7–7.7)
Neutrophils Relative %: 43 % (ref 43–77)
Platelets: 178 10*3/uL (ref 150–400)
RDW: 13.9 % (ref 11.5–15.5)
WBC: 2.8 10*3/uL — ABNORMAL LOW (ref 4.0–10.5)

## 2013-05-23 LAB — WET PREP BY MOLECULAR PROBE
Candida species: NEGATIVE
Trichomonas vaginosis: NEGATIVE

## 2013-05-24 LAB — T-HELPER CELL (CD4) - (RCID CLINIC ONLY): CD4 T Cell Abs: 210 uL — ABNORMAL LOW (ref 400–2700)

## 2013-05-24 LAB — PAP IG W/ RFLX HPV ASCU

## 2013-05-24 LAB — COMPLETE METABOLIC PANEL WITH GFR
Albumin: 3.8 g/dL (ref 3.5–5.2)
Alkaline Phosphatase: 54 U/L (ref 39–117)
BUN: 10 mg/dL (ref 6–23)
Calcium: 9.6 mg/dL (ref 8.4–10.5)
Chloride: 100 mEq/L (ref 96–112)
GFR, Est African American: >89 mL/min
GFR, Est Non African American: >89 mL/min
Glucose, Bld: 90 mg/dL (ref 70–99)
Sodium: 134 mEq/L — ABNORMAL LOW (ref 135–145)

## 2013-05-25 LAB — HIV-1 RNA QUANT-NO REFLEX-BLD: HIV-1 RNA Quant, Log: 4.59 {Log} — ABNORMAL HIGH (ref <1.30)

## 2013-06-06 ENCOUNTER — Ambulatory Visit: Payer: Medicaid Other | Admitting: Internal Medicine

## 2013-06-12 ENCOUNTER — Encounter: Payer: Self-pay | Admitting: Obstetrics

## 2013-06-19 ENCOUNTER — Other Ambulatory Visit: Payer: Self-pay

## 2013-06-19 ENCOUNTER — Encounter: Payer: Self-pay | Admitting: Obstetrics

## 2013-06-19 ENCOUNTER — Ambulatory Visit (INDEPENDENT_AMBULATORY_CARE_PROVIDER_SITE_OTHER): Payer: Medicaid Other | Admitting: Obstetrics

## 2013-06-19 VITALS — BP 144/92 | HR 74 | Temp 99.1°F | Ht 62.0 in | Wt 173.0 lb

## 2013-06-19 DIAGNOSIS — Z113 Encounter for screening for infections with a predominantly sexual mode of transmission: Secondary | ICD-10-CM

## 2013-06-19 DIAGNOSIS — N760 Acute vaginitis: Secondary | ICD-10-CM

## 2013-06-19 DIAGNOSIS — R87612 Low grade squamous intraepithelial lesion on cytologic smear of cervix (LGSIL): Secondary | ICD-10-CM

## 2013-06-19 DIAGNOSIS — Z01812 Encounter for preprocedural laboratory examination: Secondary | ICD-10-CM

## 2013-06-19 NOTE — Progress Notes (Signed)
Colposcopy Procedure Note  Indications: Pap smear 1 months ago showed: low-grade squamous intraepithelial neoplasia (LGSIL - encompassing HPV,mild dysplasia,CIN I). The prior pap showed unknown.  Prior cervical/vaginal disease: normal exam without visible pathology. Prior cervical treatment: no treatment.  Procedure Details  The risks and benefits of the procedure and Written informed consent obtained.  A time-out was performed confirming the patient, procedure and allergy status  Speculum placed in vagina and excellent visualization of cervix achieved, cervix swabbed x 3 with acetic acid solution.  Findings: Cervix: acetowhite lesion(s) noted at 9 and 10 o'clock, punctation noted at 9 o'clock, mosaicism noted at 10 o'clock and HPV changes noted at 9 o'clock; SCJ visualized 360 degrees without lesions, endocervical curettage performed, cervical biopsies taken at 8, 9 and 10 o'clock, specimen labelled and sent to pathology and hemostasis achieved with silver nitrate.   Vaginal inspection: normal without visible lesions. Vulvar colposcopy: normal mucosa without lesions.    Specimens: ECC and Cervical biopsies  Complications: none.  Plan: Specimens labelled and sent to Pathology. Will base further treatment on Pathology findings. Post biopsy instructions given to patient. Return to discuss Pathology results in 2 weeks.

## 2013-06-20 ENCOUNTER — Encounter: Payer: Self-pay | Admitting: Obstetrics

## 2013-06-20 LAB — GC/CHLAMYDIA PROBE AMP
CT Probe RNA: NEGATIVE
GC Probe RNA: NEGATIVE

## 2013-06-21 ENCOUNTER — Other Ambulatory Visit: Payer: Self-pay | Admitting: *Deleted

## 2013-06-21 ENCOUNTER — Telehealth: Payer: Self-pay | Admitting: *Deleted

## 2013-06-21 DIAGNOSIS — N760 Acute vaginitis: Secondary | ICD-10-CM

## 2013-06-21 MED ORDER — METRONIDAZOLE 500 MG PO TABS
500.0000 mg | ORAL_TABLET | Freq: Two times a day (BID) | ORAL | Status: DC
Start: 2013-06-21 — End: 2013-09-11

## 2013-06-21 NOTE — Telephone Encounter (Signed)
Called patient to try and schedule her for an OV and she advised she was out of town and will have to call us when she returns. Advised her it is important that she do so.

## 2013-07-03 ENCOUNTER — Ambulatory Visit: Payer: Medicaid Other | Admitting: Obstetrics

## 2013-07-10 ENCOUNTER — Encounter: Payer: Self-pay | Admitting: Obstetrics

## 2013-07-10 ENCOUNTER — Ambulatory Visit (INDEPENDENT_AMBULATORY_CARE_PROVIDER_SITE_OTHER): Payer: Medicaid Other | Admitting: Obstetrics

## 2013-07-10 VITALS — BP 122/88 | HR 83 | Temp 98.5°F | Ht 62.0 in | Wt 170.6 lb

## 2013-07-10 DIAGNOSIS — N871 Moderate cervical dysplasia: Secondary | ICD-10-CM | POA: Insufficient documentation

## 2013-07-10 NOTE — Progress Notes (Signed)
.   Subjective:     Leslie Gates is a 47 y.o. female here for her results from her colposcopy - done 06/19/13.  No current complaints.  Personal health questionnaire reviewed: yes.   Gynecologic History Patient's last menstrual period was 07/03/2013. Contraception: none Last Pap: 05/2013. Results were: abnormal  LSIL. Last mammogram: 2014. Results were: normal  Obstetric History OB History  No data available     The following portions of the patient's history were reviewed and updated as appropriate: allergies, current medications, past family history, past medical history, past social history, past surgical history and problem list.  Review of Systems Pertinent items are noted in HPI.    Objective:    No exam.  Consultation visit for colposcopy results.  Assessment:    CIN 2.   Plan:    Education reviewed: Management of cervical dysplasia..    F/U after next period for Cryocautery.

## 2013-07-11 ENCOUNTER — Telehealth: Payer: Self-pay | Admitting: *Deleted

## 2013-07-11 NOTE — Telephone Encounter (Signed)
Message copied by Lowell Bouton on Tue Jul 11, 2013  4:56 PM ------      Message from: Smith Mince M      Created: Thu Jun 15, 2013 10:57 AM      Regarding: Retest Medicaid Patient      Contact: [phone]       Medicaid needs updated oxygen saturation levels for this patient.       Please bring the patient in to obtain saturation levels:      1) Rest at Room Air      2) Exertion on Room Air      3) Exertion on oxygen            Speak to the need of oxygen due to the Chronic diagnosis.             Thank you Shanda Bumps! ------

## 2013-07-11 NOTE — Telephone Encounter (Signed)
Called spoke with patient, appt scheduled with MR 9.16.14 @ 1145 for ov and O2 requalification.  Nothing further needed; will sign off.

## 2013-07-25 ENCOUNTER — Other Ambulatory Visit: Payer: Medicaid Other

## 2013-07-25 ENCOUNTER — Ambulatory Visit (INDEPENDENT_AMBULATORY_CARE_PROVIDER_SITE_OTHER): Payer: Medicaid Other | Admitting: *Deleted

## 2013-07-25 ENCOUNTER — Ambulatory Visit: Payer: Medicaid Other | Admitting: Internal Medicine

## 2013-07-25 ENCOUNTER — Other Ambulatory Visit: Payer: Self-pay | Admitting: *Deleted

## 2013-07-25 ENCOUNTER — Telehealth: Payer: Self-pay | Admitting: *Deleted

## 2013-07-25 DIAGNOSIS — B20 Human immunodeficiency virus [HIV] disease: Secondary | ICD-10-CM

## 2013-07-25 DIAGNOSIS — Z23 Encounter for immunization: Secondary | ICD-10-CM

## 2013-07-25 NOTE — Telephone Encounter (Signed)
Patient called and wanted to cancel her office visit today advised she is not taking her medication. Advised she will come in for labs to check her viral load and CD4 but she will not come for a doctor visit because she knows the docotor cant tell her anything to change her mind and make her take the medication. She advised she told her doctor that the medications make her sick and she will not take them she wants and she wants to discuss herbal medications. Advised her to come in for labs but that she will need to see the doctor in 2 weeks. She agreed with this

## 2013-07-26 ENCOUNTER — Other Ambulatory Visit: Payer: Self-pay | Admitting: *Deleted

## 2013-07-26 LAB — T-HELPER CELL (CD4) - (RCID CLINIC ONLY): CD4 T Cell Abs: 190 /uL — ABNORMAL LOW (ref 400–2700)

## 2013-07-26 MED ORDER — AMLODIPINE BESYLATE 10 MG PO TABS: 10.0000 mg | ORAL_TABLET | Freq: Every day | ORAL | Status: DC

## 2013-08-01 ENCOUNTER — Encounter: Payer: Self-pay | Admitting: Internal Medicine

## 2013-08-01 ENCOUNTER — Ambulatory Visit (INDEPENDENT_AMBULATORY_CARE_PROVIDER_SITE_OTHER): Payer: Medicaid Other | Admitting: Internal Medicine

## 2013-08-01 VITALS — BP 126/80 | HR 73 | Ht 62.0 in | Wt 176.0 lb

## 2013-08-01 DIAGNOSIS — R0602 Shortness of breath: Secondary | ICD-10-CM

## 2013-08-01 DIAGNOSIS — B20 Human immunodeficiency virus [HIV] disease: Secondary | ICD-10-CM

## 2013-08-01 DIAGNOSIS — G4733 Obstructive sleep apnea (adult) (pediatric): Secondary | ICD-10-CM

## 2013-08-01 NOTE — Assessment & Plan Note (Signed)
She is not taking her HIV meds due to side effects. She has not followed up with HIV clinic since February 2014. I will have my coordinators make an appointment for her

## 2013-08-01 NOTE — Assessment & Plan Note (Signed)
appointment to follow up with Dr Coralyn Helling

## 2013-08-01 NOTE — Assessment & Plan Note (Signed)
This is resolved. She is no longer hypoxemic on exertion submaximal exertion. We'll test the oxygen overnight on room air and decide on continuing oxygen at night. She does not need oxygen any more daytime

## 2013-08-01 NOTE — Addendum Note (Signed)
Addended by: Darrell Jewel on: 08/01/2013 12:19 PM   Modules accepted: Orders

## 2013-08-01 NOTE — Patient Instructions (Addendum)
#  Shortness of Breath  - resolved  - you do not need oxygen in day time - we will test you at night for oxygen to see you if you need oxygen at night anymore - no more followup with DR Marchelle Gearing  #Sleep apnea - follow up with DR Craige Cotta; [facility] to make appointment  #HIV  - you cannot skip appointment with DR Drue Second; [facility] Will make you one  #FOllowup  =1st available with Dr Craige Cotta -= no followup with DR Marchelle Gearing [facility] to make appt for ID Clinic

## 2013-08-01 NOTE — Progress Notes (Signed)
Subjective:    Patient ID: Leslie Gates, female    DOB: 04-Nov-1966, 47 y.o.   MRN: [id number]   #HIV - since 2000  - non compliant; last ID visit fall 2012 - truvada/darunavir/ritonavir once a day - feb 2014, now compliant   #Obesity - BMI 34.22 Aug 2011  - Body mass index is 34.02 kg/(m^2). - Oct 2013 - Body mass index is 32.18 kg/(m^2). - 08/01/2013   #OSA. -> Dr Craige Cotta  - since 2003  - Dr Craige Cotta since August 2011  - Nov 2011: DR Craige Cotta has noticed that CPAP was not working and changed to auto BIPAP 25-5  - complant with bipap as of oct 2013  #Diastolic Dysfunction  - Has  ECHO June 2011 with Grade 2 diast dysfunction  #Pulmonary Hypertension - Rt heart cath on 06/23/2010. PA meanis 60, PCWP is 25-30, TRANPULM GRADIENT - 30, CI is 2.3L, and PVR is 7.1 Woods Unit.  - Spironlactone was started. AFter this dyspnea and edema has improved - RHC 02/01/12:  -Findings: RA = 6 , RV = 39/4/8 , PA = 42/14 (25) , PCW = 9 , Fick cardiac output/index = 6.2/3.2 , PVR = 2.5 Woods , FA sat = 96%  PA sat = 72%, 73% . Assessment: 1. Minimal PAH with almost complete resolution of pulmonary HTN. 2. Well compensated left-side pressures  Plan/Discussion:  Continue current therapy.   #Dyspnea (multifactorial)   - Cass 3 dyspnea with desaturation at 185 feet of walkingon room air in July 2011.  -  PFTs Spirometry June 2011 suggestive of restriction,   - CT Chest June 201 without evidence of PE or parencymal disease.   - ABG on room air from May 13, 2010: pH 7.42, PaCO2 56, PaO2 59.  - Jan 2012: Still with class 3 dyspnea. Desaturated to 88% after walking 155 feet x 2 laps but not using o2 at night - September 2014: Dyspnea resolved did not desaturate on walking 185 feet x3 laps on room air   OV 08/01/2013  Followup for dyspnea multifactorial. I last saw her in April 2014 at that time she stable. Subsequently she did see cardiology in June 2014 and was noticed to be euvolemic. They recommended  continuing her oxygen and CPAP at followup echo recommended in 3 months. Today she tells me that her dyspnea has resolved. She no longer has cough, chest pain, edema, paroxysmal nocturnal dyspnea, orthopnea, wheeze, hemoptysis. In fact Walking desaturation test 108 5 feet x3 laps on room air: Did not desaturate. Lowest pulse ox at the end of the third lap was 95%. Peak heart rate was 111 per minute and she did not have dyspnea.   Past, Family, Social reviewed: no change since last visit but  I do note that she has not attended the HIV clinic since February 2014. Infact  not taking her HIV meds. She also has not followed up with sleep specialist Dr Coralyn Helling HPI Review of Systems  Constitutional: Negative for fever and unexpected weight change.  HENT: Negative for ear pain, nosebleeds, congestion, sore throat, rhinorrhea, sneezing, trouble swallowing, dental problem, postnasal drip and sinus pressure.   Eyes: Negative for redness and itching.  Respiratory: Positive for chest tightness. Negative for cough, shortness of breath and wheezing.   Cardiovascular: Negative for palpitations and leg swelling.  Gastrointestinal: Negative for nausea and vomiting.  Genitourinary: Negative for dysuria.  Musculoskeletal: Negative for joint swelling.  Skin: Negative for rash.  Neurological: Negative for headaches.  Hematological:  Does not bruise/bleed easily.  Psychiatric/Behavioral: Negative for dysphoric mood. The patient is not nervous/anxious.        Objective:   Physical Exam Filed Vitals:   08/01/13 1158  BP: 126/80  Pulse: 73  Height: 5\' 2"  (1.575 m)  Weight: 176 lb (79.833 kg)  SpO2: 95%     Physical Exam GEN: A/Ox3; pleasant , NAD, obese   HEENT:  [place]/AT,  EACs-clear, TMs-wnl, NOSE-clear, THROAT-clear, no lesions, no postnasal drip or exudate noted.   NECK:  Supple w/ fair ROM; no JVD; normal carotid impulses w/o bruits; no thyromegaly or nodules palpated; no lymphadenopathy.  RESP   Coarse BS w/ no wheezing no accessory muscle use, no dullness to percussion  CARD:  RRR, no m/r/g  , no peripheral edema, pulses intact, no cyanosis or clubbing.  GI:   Soft & nt; nml bowel sounds; no organomegaly or masses detected.  Musco: Warm bil, no deformities or joint swelling noted.   Neuro: alert, no focal deficits noted.    Skin: Warm, no lesions or rashes             Assessment & Plan:

## 2013-08-22 ENCOUNTER — Ambulatory Visit (HOSPITAL_BASED_OUTPATIENT_CLINIC_OR_DEPARTMENT_OTHER)
Admission: RE | Admit: 2013-08-22 | Discharge: 2013-08-22 | Disposition: A | Discharge status: Home / Self Care | Payer: Medicaid Other | Source: Ambulatory Visit | Attending: Cardiology | Admitting: Cardiology

## 2013-08-22 ENCOUNTER — Ambulatory Visit (HOSPITAL_COMMUNITY)
Admission: RE | Admit: 2013-08-22 | Discharge: 2013-08-22 | Disposition: A | Discharge status: Home / Self Care | Payer: Medicaid Other | Source: Ambulatory Visit | Attending: Cardiology | Admitting: Cardiology

## 2013-08-22 ENCOUNTER — Encounter (HOSPITAL_COMMUNITY): Payer: Self-pay

## 2013-08-22 VITALS — BP 108/86 | HR 78 | Ht 62.0 in | Wt 170.1 lb

## 2013-08-22 DIAGNOSIS — I5032 Chronic diastolic (congestive) heart failure: Secondary | ICD-10-CM | POA: Insufficient documentation

## 2013-08-22 DIAGNOSIS — I272 Pulmonary hypertension, unspecified: Secondary | ICD-10-CM

## 2013-08-22 DIAGNOSIS — I079 Rheumatic tricuspid valve disease, unspecified: Secondary | ICD-10-CM | POA: Insufficient documentation

## 2013-08-22 DIAGNOSIS — Z21 Asymptomatic human immunodeficiency virus [HIV] infection status: Secondary | ICD-10-CM | POA: Insufficient documentation

## 2013-08-22 DIAGNOSIS — I2789 Other specified pulmonary heart diseases: Secondary | ICD-10-CM

## 2013-08-22 DIAGNOSIS — E669 Obesity, unspecified: Secondary | ICD-10-CM | POA: Insufficient documentation

## 2013-08-22 DIAGNOSIS — I2510 Atherosclerotic heart disease of native coronary artery without angina pectoris: Secondary | ICD-10-CM | POA: Insufficient documentation

## 2013-08-22 DIAGNOSIS — G4733 Obstructive sleep apnea (adult) (pediatric): Secondary | ICD-10-CM | POA: Insufficient documentation

## 2013-08-22 DIAGNOSIS — I10 Essential (primary) hypertension: Secondary | ICD-10-CM | POA: Insufficient documentation

## 2013-08-22 DIAGNOSIS — I369 Nonrheumatic tricuspid valve disorder, unspecified: Secondary | ICD-10-CM

## 2013-08-22 DIAGNOSIS — I509 Heart failure, unspecified: Secondary | ICD-10-CM | POA: Insufficient documentation

## 2013-08-22 DIAGNOSIS — I270 Primary pulmonary hypertension: Secondary | ICD-10-CM | POA: Insufficient documentation

## 2013-08-22 NOTE — Progress Notes (Signed)
Patient ID: Leslie Gates, female   DOB: 11-20-1965, 47 y.o.   MRN: [id number] Pulmonologist: Dr Marchelle Gearing ID: Dr Drue Second  HPI: Ms. Fronczak is a 47 year old woman with history of obesity, hypertension, HIV, and obstructive sleep apnea who was found to have pulmonary arterial hypertension with RV dysfunction on echocardiogram.    RHC on August 2011: RA 21, RV pressure 78/39/21.  Pulmonary artery pressure 84/44 (60). PCWP 25-30.  Fick  4.2 L/2.3 Pulmonary vascular resistance was 7.1 Woods units.  She was started on spironolactone for her elevated L heart pressures but we also suggested the possibility of Adcirca as a pulmonary vasodilator .   She had a nuclear stress in January 2012 that was normal with EF 68%.  09/2011 EF 60 %.RV mildly dilated minimal hypokinesis. No TR jet.  02/01/12: RHC (improved) RA = 6  RV = 39/4/8  PA = 42/14 (25)  PCW = 9  Fick cardiac output/index = 6.2/3.2  PVR = 2.5 Woods  FA sat = 96%  PA sat = 72%, 73%  ECHO 08/22/13: EF 60-65%, normal diastolic function, mild RV dilation with normal RV systolic function, PA systolic pressure 29 mmHg.   She returns for follow up . Saw Dr. Marchelle Gearing 08/01/13. No desaturations noted. Not taking HIV medications. Uses CPAP and oxygen every night.  SOB going up steps, otherwise no dyspnea. Denies presyncope. No chest pain.   ROS: All other systems normal except as mentioned in HPI, past medical history and problem list.    Past Medical History  Diagnosis Date  . Pulmonary HTN   . OSA (obstructive sleep apnea)   . Diastolic CHF, chronic   . HTN (hypertension)   . Human immunodeficiency virus (HIV) 2001  . Wrist fracture, bilateral   . History of herpes zoster   . History of cervical dysplasia   . Dysphagia   . Chronic otitis media   . Allergic rhinitis   . Obesity   . Coronary artery disease     Current Outpatient Prescriptions  Medication Sig Dispense Refill  . amLODipine (NORVASC) 10 MG tablet Take 1 tablet (10 mg  total) by mouth daily.  90 tablet  3  . cetirizine (ZYRTEC) 10 MG tablet Take 1 tablet (10 mg total) by mouth daily.  30 tablet  11  . Darunavir Ethanolate (PREZISTA) 800 MG tablet Take 1 tablet (800 mg total) by mouth daily with breakfast.  30 tablet  11  . emtricitabine-tenofovir (TRUVADA) 200-300 MG per tablet Take 1 tablet by mouth daily.  30 tablet  11  . furosemide (LASIX) 20 MG tablet Take 20 mg by mouth daily.       Marland Kitchen guaiFENesin (MUCINEX) 600 MG 12 hr tablet Take 1 tablet (600 mg total) by mouth 2 (two) times daily.  30 tablet  0  . loratadine (CLARITIN) 10 MG tablet Take 1 tablet (10 mg total) by mouth daily.  30 tablet  4  . metroNIDAZOLE (FLAGYL) 500 MG tablet Take 1 tablet (500 mg total) by mouth 2 (two) times daily.  14 tablet  0  . Multiple Vitamin (MULTIVITAMIN) tablet Take 1 tablet by mouth daily.  30 tablet  6  . ondansetron (ZOFRAN) 4 MG tablet Take 1 tablet (4 mg total) by mouth every 12 (twelve) hours as needed for nausea.  30 tablet  3  . PROAIR HFA 108 (90 BASE) MCG/ACT inhaler Inhale 1 puff into the lungs every 6 (six) hours as needed.  8.5 g  5  .  ritonavir (NORVIR) 100 MG TABS Take 1 tablet (100 mg total) by mouth daily with breakfast.  30 tablet  11  . sodium chloride (OCEAN) 0.65 % nasal spray Place 1 spray into the nose as needed for congestion.  15 mL  12  . spironolactone (ALDACTONE) 25 MG tablet Take 25 mg by mouth daily.        No current facility-administered medications for this encounter.    Allergies  Allergen Reactions  . Lisinopril Swelling    Very swollen bottom lip   Filed Vitals:   08/22/13 1153  BP: 108/86  Pulse: 78  Height: 5\' 2"  (1.575 m)  Weight: 170 lb 1.9 oz (77.166 kg)  SpO2: 99%    PHYSICAL EXAM: General:  Well appearing. No respiratory difficulty  HEENT: normal Neck: supple. JVP flat. Carotids 2+ bilat; no bruits. No lymphadenopathy or thryomegaly appreciated. Cor: PMI nondisplaced. Regular rate & rhythm. No rubs, gallops or  murmurs. Lungs: clear Abdomen: obese, soft, nontender, nondistended. No hepatosplenomegaly. No bruits or masses. Good bowel sounds. Extremities: no cyanosis, clubbing, rash, edema Neuro: alert & oriented x 3, cranial nerves grossly intact. moves all 4 extremities w/o difficulty. Affect pleasant.    ASSESSMENT & PLAN:  1. Pulmonary HTN  Dr Shirlee Latch reviewed and discussed ECHO.EF 60%  Peak PA pressure 29  Appears stable.  2. OSA  Continue CPAP and oxygen nightly 3. HIV  F/U to be scheduled in the HIV clinic.    Follow up in 1 year  CLEGG,AMY 12:19 PM  Patient seen with NP, agree with the above note.  She is doing well symptomatically.  Echo showed normal RV systolic function with mild RV dilation and PA systolic pressure 29 mmHg.  She seems to be doing much better on CPAP and oxygen at night.  Given the minimal symptoms, I do not think that repeat RHC or pulmonary vasodilators would be warranted.  Followup 1 year.   Marca Ancona 08/23/2013

## 2013-08-22 NOTE — Patient Instructions (Addendum)
Follow up in 1 year.

## 2013-08-22 NOTE — Progress Notes (Signed)
Echocardiogram 2D Echocardiogram has been performed.  Leslie Gates 08/22/2013, 11:37 AM

## 2013-09-04 ENCOUNTER — Ambulatory Visit: Payer: Medicaid Other | Admitting: Pulmonary Disease

## 2013-09-08 ENCOUNTER — Telehealth: Payer: Self-pay | Admitting: Internal Medicine

## 2013-09-08 NOTE — Telephone Encounter (Signed)
Onp 08/03/13 - pulse ox < 88% fo 203 i=minutes . So she needs o2 at night with her bipap/cpap. She does not need dayt time o2 based on ov  recxently walk test which was normal  Dr. Kalman Shan, M.D., [facility].C.P Pulmonary and Critical Care Medicine Staff Physician [facility] Pager: [phone], If no answer or between  15:00h - 7:00h: call [phone]  09/08/2013 7:23 PM

## 2013-09-11 ENCOUNTER — Encounter (HOSPITAL_COMMUNITY): Payer: Self-pay | Admitting: Emergency Medicine

## 2013-09-11 ENCOUNTER — Emergency Department (HOSPITAL_COMMUNITY)
Admission: EM | Admit: 2013-09-11 | Discharge: 2013-09-11 | Disposition: A | Discharge status: Home / Self Care | Payer: Medicaid Other | Attending: Emergency Medicine | Admitting: Emergency Medicine

## 2013-09-11 ENCOUNTER — Emergency Department (HOSPITAL_COMMUNITY): Payer: Medicaid Other

## 2013-09-11 DIAGNOSIS — Z8669 Personal history of other diseases of the nervous system and sense organs: Secondary | ICD-10-CM | POA: Insufficient documentation

## 2013-09-11 DIAGNOSIS — I2789 Other specified pulmonary heart diseases: Secondary | ICD-10-CM | POA: Insufficient documentation

## 2013-09-11 DIAGNOSIS — I5032 Chronic diastolic (congestive) heart failure: Secondary | ICD-10-CM | POA: Insufficient documentation

## 2013-09-11 DIAGNOSIS — Z21 Asymptomatic human immunodeficiency virus [HIV] infection status: Secondary | ICD-10-CM | POA: Insufficient documentation

## 2013-09-11 DIAGNOSIS — J069 Acute upper respiratory infection, unspecified: Secondary | ICD-10-CM | POA: Insufficient documentation

## 2013-09-11 DIAGNOSIS — I2510 Atherosclerotic heart disease of native coronary artery without angina pectoris: Secondary | ICD-10-CM | POA: Insufficient documentation

## 2013-09-11 DIAGNOSIS — J45901 Unspecified asthma with (acute) exacerbation: Secondary | ICD-10-CM | POA: Insufficient documentation

## 2013-09-11 DIAGNOSIS — Z8781 Personal history of (healed) traumatic fracture: Secondary | ICD-10-CM | POA: Insufficient documentation

## 2013-09-11 DIAGNOSIS — Z8619 Personal history of other infectious and parasitic diseases: Secondary | ICD-10-CM | POA: Insufficient documentation

## 2013-09-11 DIAGNOSIS — Z79899 Other long term (current) drug therapy: Secondary | ICD-10-CM | POA: Insufficient documentation

## 2013-09-11 DIAGNOSIS — E669 Obesity, unspecified: Secondary | ICD-10-CM | POA: Insufficient documentation

## 2013-09-11 DIAGNOSIS — Z87410 Personal history of cervical dysplasia: Secondary | ICD-10-CM | POA: Insufficient documentation

## 2013-09-11 MED ORDER — AZITHROMYCIN 250 MG PO TABS: 250.0000 mg | ORAL_TABLET | Freq: Every day | ORAL | Status: DC

## 2013-09-11 MED ORDER — HYDROCOD POLST-CHLORPHEN POLST 10-8 MG/5ML PO LQCR: 5.0000 mL | Freq: Two times a day (BID) | ORAL | Status: DC | PRN

## 2013-09-11 MED ORDER — PREDNISONE 20 MG PO TABS: 60.0000 mg | ORAL_TABLET | Freq: Every day | ORAL | Status: DC

## 2013-09-11 NOTE — ED Notes (Signed)
Pt reports cough and chest congestion x 2 weeks.

## 2013-09-11 NOTE — ED Provider Notes (Signed)
CSN: [id number]     Arrival date & time 09/11/13  1642 History  This chart was scribed for non-physician practitioner Santiago Glad, PA-C working with Suzi Roots, MD by Valera Castle, ED scribe. This patient was seen in room WTR8/WTR8 and the patient's care was started at 5:17 PM.     Chief Complaint  Patient presents with  . Cough  . Chest Pain   The history is provided by the patient. No language interpreter was used.   HPI Comments: Leslie Gates is a 47 y.o. female with h/o asthma who presents to the Emergency Department complaining of sudden, moderate, intermittent, unproductive cough, onset 2 weeks ago, with associated SOB. She reports the SOB is exacerbated when she lays down, and that it is has kept her up at night. She reports taking Nyquil and Mucinex for her symptoms, with no relief. She reports regularly taking her Albuterol for her asthma, with relief. She denies fever, congestion, chest pain, sinus pressure, and any other associated symptoms. She denies h/o smoking.   5:39 PM - Pt has a h/o HIV, followed infectious disease, and has an appointment tomorrow.   Past Medical History  Diagnosis Date  . Pulmonary HTN   . OSA (obstructive sleep apnea)   . Diastolic CHF, chronic   . HTN (hypertension)   . Human immunodeficiency virus (HIV) 2001  . Wrist fracture, bilateral   . History of herpes zoster   . History of cervical dysplasia   . Dysphagia   . Chronic otitis media   . Allergic rhinitis   . Obesity   . Coronary artery disease    Past Surgical History  Procedure Laterality Date  . Tympanostomy tube placement      as a child  . Cesarean section    . Nasal sinus surgery  2004    [facility]  . Wrist fracture surgery  2006    bilateral - [facility]  . Adenoidectomy     Family History  Problem Relation Age of Onset  . Other Mother 9    ESRD  . Other Father     GSW  . Hypertension     History  Substance Use Topics  . Smoking status: Never Smoker   .  Smokeless tobacco: Never Used  . Alcohol Use: No   OB History   Grav Para Term Preterm Abortions TAB SAB Ect Mult Living                 Review of Systems  Constitutional: Negative for fever.  HENT: Negative for congestion (chest), rhinorrhea and sinus pressure.   Respiratory: Positive for cough and shortness of breath.   Cardiovascular: Negative for chest pain.  All other systems reviewed and are negative.    Allergies  Lisinopril  Home Medications   Current Outpatient Rx  Name  Route  Sig  Dispense  Refill  . amLODipine (NORVASC) 10 MG tablet   Oral   Take 1 tablet (10 mg total) by mouth daily.   90 tablet   3   . cetirizine (ZYRTEC) 10 MG tablet   Oral   Take 1 tablet (10 mg total) by mouth daily.   30 tablet   11   . Darunavir Ethanolate (PREZISTA) 800 MG tablet   Oral   Take 1 tablet (800 mg total) by mouth daily with breakfast.   30 tablet   11   . emtricitabine-tenofovir (TRUVADA) 200-300 MG per tablet   Oral   Take 1 tablet  by mouth daily.   30 tablet   11   . furosemide (LASIX) 20 MG tablet   Oral   Take 20 mg by mouth daily.          Marland Kitchen guaiFENesin (MUCINEX) 600 MG 12 hr tablet   Oral   Take 1 tablet (600 mg total) by mouth 2 (two) times daily.   30 tablet   0   . loratadine (CLARITIN) 10 MG tablet      Take 1 tablet (10 mg total) by mouth daily.   30 tablet   4   . metroNIDAZOLE (FLAGYL) 500 MG tablet   Oral   Take 1 tablet (500 mg total) by mouth 2 (two) times daily.   14 tablet   0   . Multiple Vitamin (MULTIVITAMIN) tablet   Oral   Take 1 tablet by mouth daily.   30 tablet   6   . ondansetron (ZOFRAN) 4 MG tablet   Oral   Take 1 tablet (4 mg total) by mouth every 12 (twelve) hours as needed for nausea.   30 tablet   3   . PROAIR HFA 108 (90 BASE) MCG/ACT inhaler      Inhale 1 puff into the lungs every 6 (six) hours as needed.   8.5 g   5     Refill [id number]   . ritonavir (NORVIR) 100 MG TABS   Oral   Take 1  tablet (100 mg total) by mouth daily with breakfast.   30 tablet   11   . sodium chloride (OCEAN) 0.65 % nasal spray   Nasal   Place 1 spray into the nose as needed for congestion.   15 mL   12   . spironolactone (ALDACTONE) 25 MG tablet   Oral   Take 25 mg by mouth daily.           Triage Vital: BP 122/79  Pulse 77  Temp(Src) 99 F (37.2 C) (Oral)  Resp 20  SpO2 99%  LMP 07/27/2013  Physical Exam  Nursing note and vitals reviewed. Constitutional: She is oriented to person, place, and time. She appears well-developed and well-nourished. No distress.  HENT:  Head: Normocephalic and atraumatic.  Right Ear: Tympanic membrane, external ear and ear canal normal.  Left Ear: Tympanic membrane, external ear and ear canal normal.  Mouth/Throat: Oropharynx is clear and moist.  Some nasal congestion.   Eyes: EOM are normal.  Neck: Neck supple. No tracheal deviation present.  Cardiovascular: Normal rate, regular rhythm and normal heart sounds.   Pulmonary/Chest: Effort normal and breath sounds normal. No respiratory distress.  Musculoskeletal: Normal range of motion.  Neurological: She is alert and oriented to person, place, and time.  Skin: Skin is warm and dry.  Psychiatric: She has a normal mood and affect. Her behavior is normal.    ED Course  Procedures (including critical care time)  DIAGNOSTIC STUDIES: Oxygen Saturation is 99% on room air, normal by my interpretation.    COORDINATION OF CARE: 5:21 PM-Discussed treatment plan which includes CXR with pt at bedside and pt agreed to plan.   Labs Review Labs Reviewed - No data to display Imaging Review Dg Chest 2 View  09/11/2013   CLINICAL DATA:  Cough, shortness of breath  EXAM: CHEST  2 VIEW  COMPARISON:  02/19/2012  FINDINGS: Lungs are essentially clear. No focal consolidation. No pleural effusion or pneumothorax.  Heart is normal in size.  Visualized osseous structures are within normal limits.  IMPRESSION: No  evidence of acute cardiopulmonary disease.   Electronically Signed   By: Charline Bills M.D.   On: 09/11/2013 17:28    EKG Interpretation   None      No orders of the defined types were placed in this encounter.    MDM  No diagnosis found. Pt CXR negative for acute infiltrate. Patients symptoms are consistent with URI.    Pt will be discharged with symptomatic treatment.  Verbalizes understanding and is agreeable with plan. Pt is hemodynamically stable & in NAD prior to dc.  Patient afebrile with no signs of respiratory distress.  I personally performed the services described in this documentation, which was scribed in my presence. The recorded information has been reviewed and is accurate.    Santiago Glad, PA-C 09/12/13 2223  Santiago Glad, PA-C 09/12/13 2225

## 2013-09-12 ENCOUNTER — Ambulatory Visit: Payer: Medicaid Other | Admitting: Internal Medicine

## 2013-09-12 ENCOUNTER — Telehealth: Payer: Self-pay | Admitting: *Deleted

## 2013-09-12 NOTE — Telephone Encounter (Signed)
Called the patient to find out if she still needs to be seen for sick visit and she advised she went to the ED last night 09/11/13 and was seen and got medication so she did not need to be seen today. Advised her will make a note.

## 2013-09-13 NOTE — Telephone Encounter (Signed)
Pt advised. She has oxygen to use at bedtime. Carron Curie, CMA

## 2013-09-14 NOTE — ED Provider Notes (Signed)
Medical screening examination/treatment/procedure(s) were performed by non-physician practitioner and as supervising physician I was immediately available for consultation/collaboration.  EKG Interpretation   None         Suzi Roots, MD 09/14/13 1650

## 2013-09-15 ENCOUNTER — Other Ambulatory Visit: Payer: Self-pay | Admitting: Internal Medicine

## 2013-09-15 DIAGNOSIS — I2789 Other specified pulmonary heart diseases: Secondary | ICD-10-CM

## 2013-09-25 ENCOUNTER — Ambulatory Visit (HOSPITAL_COMMUNITY)
Admission: RE | Admit: 2013-09-25 | Discharge: 2013-09-25 | Disposition: A | Discharge status: Home / Self Care | Payer: Medicaid Other | Source: Ambulatory Visit | Attending: Internal Medicine | Admitting: Internal Medicine

## 2013-09-25 VITALS — BP 126/76 | HR 87 | Wt 167.0 lb

## 2013-09-25 DIAGNOSIS — J209 Acute bronchitis, unspecified: Secondary | ICD-10-CM

## 2013-09-25 DIAGNOSIS — G4733 Obstructive sleep apnea (adult) (pediatric): Secondary | ICD-10-CM | POA: Insufficient documentation

## 2013-09-25 DIAGNOSIS — I2789 Other specified pulmonary heart diseases: Secondary | ICD-10-CM | POA: Insufficient documentation

## 2013-09-25 MED ORDER — DOXYCYCLINE HYCLATE 100 MG PO CAPS: 100.0000 mg | ORAL_CAPSULE | Freq: Two times a day (BID) | ORAL | Status: DC

## 2013-09-25 NOTE — Progress Notes (Signed)
Patient ID: Leslie Gates, female   DOB: 11/26/1965, 47 y.o.   MRN: [id number] Pulmonologist: Dr Marchelle Gearing ID: Dr Drue Second  HPI: Leslie Gates is a 47 year old woman with history of obesity, hypertension, HIV, and obstructive sleep apnea who was found to have pulmonary arterial hypertension with RV dysfunction on echocardiogram.    RHC on August 2011: RA 21, RV pressure 78/39/21.  Pulmonary artery pressure 84/44 (60). PCWP 25-30.  Fick  4.2 L/2.3 Pulmonary vascular resistance was 7.1 Woods units.  She was started on spironolactone for her elevated L heart pressures but we also suggested the possibility of Adcirca as a pulmonary vasodilator .   She had a nuclear stress in January 2012 that was normal with EF 68%.  09/2011 EF 60 %.RV mildly dilated minimal hypokinesis. No TR jet.  02/01/12: RHC (improved) RA = 6  RV = 39/4/8  PA = 42/14 (25)  PCW = 9  Fick cardiac output/index = 6.2/3.2  PVR = 2.5 Woods  FA sat = 96%  PA sat = 72%, 73%  ECHO 08/22/13: EF 60-65%, normal diastolic function, mild RV dilation with normal RV systolic function, PA systolic pressure 29 mmHg.   Evaluated 09/11/13 at [facility] ED for increased dyspnea. Started on azithromycin.    She returns for follow up. Complains of mild dyspnea on exertion and cough. She says she did not improve after she completed on azithromycin.   Coughing up white sputum. Uses CPAP and oxygen every night.  SOB going up steps. Denies presyncope. Weight at home trending down from 190 to 170 pounds.  Taking all medications.   ROS: All other systems normal except as mentioned in HPI, past medical history and problem list.    Past Medical History  Diagnosis Date  . Pulmonary HTN   . OSA (obstructive sleep apnea)   . Diastolic CHF, chronic   . HTN (hypertension)   . Human immunodeficiency virus (HIV) 2001  . Wrist fracture, bilateral   . History of herpes zoster   . History of cervical dysplasia   . Dysphagia   . Chronic otitis media   . Allergic  rhinitis   . Obesity   . Coronary artery disease     Current Outpatient Prescriptions  Medication Sig Dispense Refill  . amLODipine (NORVASC) 10 MG tablet Take 1 tablet (10 mg total) by mouth daily.  90 tablet  3  . cetirizine (ZYRTEC) 10 MG tablet Take 1 tablet (10 mg total) by mouth daily.  30 tablet  11  . chlorpheniramine-HYDROcodone (TUSSIONEX PENNKINETIC ER) 10-8 MG/5ML LQCR Take 5 mLs by mouth every 12 (twelve) hours as needed.  115 mL  0  . Darunavir Ethanolate (PREZISTA) 800 MG tablet Take 1 tablet (800 mg total) by mouth daily with breakfast.  30 tablet  11  . emtricitabine-tenofovir (TRUVADA) 200-300 MG per tablet Take 1 tablet by mouth daily.  30 tablet  11  . furosemide (LASIX) 20 MG tablet Take 20 mg by mouth daily.       Marland Kitchen guaiFENesin (MUCINEX) 600 MG 12 hr tablet Take 1 tablet (600 mg total) by mouth 2 (two) times daily.  30 tablet  0  . loratadine (CLARITIN) 10 MG tablet Take 1 tablet (10 mg total) by mouth daily.  30 tablet  4  . Multiple Vitamin (MULTIVITAMIN) tablet Take 1 tablet by mouth daily.  30 tablet  6  . predniSONE (DELTASONE) 20 MG tablet Take 3 tablets (60 mg total) by mouth daily.  15 tablet  0  . PROAIR HFA 108 (90 BASE) MCG/ACT inhaler Inhale 1 puff into the lungs every 6 (six) hours as needed.  8.5 g  5  . ritonavir (NORVIR) 100 MG TABS Take 1 tablet (100 mg total) by mouth daily with breakfast.  30 tablet  11  . spironolactone (ALDACTONE) 25 MG tablet Take 25 mg by mouth daily.        No current facility-administered medications for this encounter.    Allergies  Allergen Reactions  . Lisinopril Swelling    Very swollen bottom lip   Filed Vitals:   09/25/13 1348  BP: 126/76  Pulse: 87  Weight: 167 lb (75.751 kg)  SpO2: 97%    PHYSICAL EXAM: General:  Well appearing. No respiratory difficulty  HEENT: normal Neck: supple. JVP flat. Carotids 2+ bilat; no bruits. No lymphadenopathy or thryomegaly appreciated. Cor: PMI nondisplaced. Regular rate &  rhythm. No rubs, gallops or murmurs. Lungs: clear Abdomen: obese, soft, nontender, nondistended. No hepatosplenomegaly. No bruits or masses. Good bowel sounds. Extremities: no cyanosis, clubbing, rash, edema Neuro: alert & oriented x 3, cranial nerves grossly intact. moves all 4 extremities w/o difficulty. Affect pleasant.    ASSESSMENT & PLAN:  1. Pulmonary HTN - ECHO.EF 60%  Peak PA pressure 29 Appears stable.  2. OSA -Continue CPAP and oxygen nightly. Follow up next month with Dr Craige Cotta next month  3. HIV - F/U to be scheduled in the HIV clinic.   4. Acute Bronchitis - Start Doxycycline 100 mg bid for 7 days.  Follow up in 2 weeks.  CLEGG,AMYNP-C 2:00 PM

## 2013-09-25 NOTE — Patient Instructions (Signed)
Follow up in 2 weeks  Take 100 mg doxycycline twice a day

## 2013-09-26 ENCOUNTER — Telehealth: Payer: Self-pay | Admitting: *Deleted

## 2013-09-26 ENCOUNTER — Encounter: Payer: Self-pay | Admitting: Internal Medicine

## 2013-09-26 ENCOUNTER — Ambulatory Visit (INDEPENDENT_AMBULATORY_CARE_PROVIDER_SITE_OTHER): Payer: Medicaid Other | Admitting: Internal Medicine

## 2013-09-26 VITALS — BP 129/84 | HR 80 | Temp 97.4°F | Wt 169.0 lb

## 2013-09-26 DIAGNOSIS — B20 Human immunodeficiency virus [HIV] disease: Secondary | ICD-10-CM

## 2013-09-26 MED ORDER — SULFAMETHOXAZOLE-TRIMETHOPRIM 400-80 MG PO TABS: 1.0000 | ORAL_TABLET | Freq: Two times a day (BID) | ORAL | Status: DC

## 2013-09-26 MED ORDER — ELVITEG-COBIC-EMTRICIT-TENOFDF 150-150-200-300 MG PO TABS: 1.0000 | ORAL_TABLET | Freq: Every day | ORAL | Status: DC

## 2013-09-26 NOTE — Telephone Encounter (Signed)
Per Dr Drue Second called [facility] and D/C the patient Truvada, Prezista, and Norvir.

## 2013-09-26 NOTE — Progress Notes (Signed)
[facility] NOTE  RFV: sick visit, last seen in Feburary Subjective:    Patient ID: Leslie Gates, female    DOB: Oct 01, 1966, 47 y.o.   MRN: [id number]  HPI 47yo F with HIV, CD 4 coutn of 190/VL 65,040, was seen by PCP yesterday for bronchitis. She was supposed to be on truvada/DRVr but really never started taking meds. She is hesistant to taking so many meds. Interested in restarting taking stribild.  Current Outpatient Prescriptions on File Prior to Visit  Medication Sig Dispense Refill  . amLODipine (NORVASC) 10 MG tablet Take 1 tablet (10 mg total) by mouth daily.  90 tablet  3  . cetirizine (ZYRTEC) 10 MG tablet Take 1 tablet (10 mg total) by mouth daily.  30 tablet  11  . chlorpheniramine-HYDROcodone (TUSSIONEX PENNKINETIC ER) 10-8 MG/5ML LQCR Take 5 mLs by mouth every 12 (twelve) hours as needed.  115 mL  0  . Darunavir Ethanolate (PREZISTA) 800 MG tablet Take 1 tablet (800 mg total) by mouth daily with breakfast.  30 tablet  11  . doxycycline (VIBRAMYCIN) 100 MG capsule Take 1 capsule (100 mg total) by mouth 2 (two) times daily.  14 capsule  0  . emtricitabine-tenofovir (TRUVADA) 200-300 MG per tablet Take 1 tablet by mouth daily.  30 tablet  11  . furosemide (LASIX) 20 MG tablet Take 20 mg by mouth daily.       Marland Kitchen guaiFENesin (MUCINEX) 600 MG 12 hr tablet Take 1 tablet (600 mg total) by mouth 2 (two) times daily.  30 tablet  0  . loratadine (CLARITIN) 10 MG tablet Take 1 tablet (10 mg total) by mouth daily.  30 tablet  4  . Multiple Vitamin (MULTIVITAMIN) tablet Take 1 tablet by mouth daily.  30 tablet  6  . predniSONE (DELTASONE) 20 MG tablet Take 3 tablets (60 mg total) by mouth daily.  15 tablet  0  . PROAIR HFA 108 (90 BASE) MCG/ACT inhaler Inhale 1 puff into the lungs every 6 (six) hours as needed.  8.5 g  5  . ritonavir (NORVIR) 100 MG TABS Take 1 tablet (100 mg total) by mouth daily with breakfast.  30 tablet  11  . spironolactone (ALDACTONE) 25 MG tablet Take 25 mg by  mouth daily.        No current facility-administered medications on file prior to visit.      Review of Systems 12 point ROS negative other than nasal congestion and productive cough    Objective:   Physical Exam BP 129/84  Pulse 80  Temp(Src) 97.4 F (36.3 C) (Oral)  Wt 169 lb (76.658 kg)  SpO2 98%  LMP 09/11/2013 Physical Exam  Constitutional:  oriented to person, place, and time. appears well-developed and well-nourished. No distress.  HENT:  Mouth/Throat: Oropharynx is clear and moist. No oropharyngeal exudate.  Cardiovascular: Normal rate, regular rhythm and normal heart sounds. Exam reveals no gallop and no friction rub.  No murmur heard.  Pulmonary/Chest: Effort normal and breath sounds normal. No respiratory distress.  no wheezes.  Lymphadenopathy:  no cervical adenopathy.  Skin: Skin is warm and dry. No rash noted. No erythema.          Assessment & Plan:  hiv = start bactrim PCP proph and taking stribild  Bronchitis = continue with doxycycline  Women's health = will need pap and mammo  Health maintenance = received flu vax in sep 2014

## 2013-10-10 ENCOUNTER — Encounter (HOSPITAL_COMMUNITY): Payer: Medicaid Other

## 2013-10-11 ENCOUNTER — Other Ambulatory Visit: Payer: Self-pay | Admitting: Internal Medicine

## 2013-10-16 ENCOUNTER — Ambulatory Visit (INDEPENDENT_AMBULATORY_CARE_PROVIDER_SITE_OTHER): Payer: Medicaid Other | Admitting: Pulmonary Disease

## 2013-10-16 ENCOUNTER — Encounter: Payer: Self-pay | Admitting: Pulmonary Disease

## 2013-10-16 VITALS — BP 116/76 | HR 94 | Ht 62.0 in | Wt 169.0 lb

## 2013-10-16 DIAGNOSIS — G4733 Obstructive sleep apnea (adult) (pediatric): Secondary | ICD-10-CM

## 2013-10-16 DIAGNOSIS — E662 Morbid (severe) obesity with alveolar hypoventilation: Secondary | ICD-10-CM

## 2013-10-16 DIAGNOSIS — G4736 Sleep related hypoventilation in conditions classified elsewhere: Secondary | ICD-10-CM

## 2013-10-16 NOTE — Progress Notes (Signed)
Chief Complaint  Patient presents with  . Sleep Apnea    Currently using CPAP machine every night. Reports having issues with the machine, mask and pressure.    History of Present Illness: Leslie Gates is a 47 y.o. female with OSA, and OHS.  I last saw her in December 2011.  She was using BiPAP and 5 liters oxygen.  She has lost almost 35 lbs since then.  She goes to bed at 9 pm.  She falls asleep quickly.  She wakes up during the night to use the bathroom, and with occasional cough.  She gets out of bed at 6 am.  She feels tired in the morning.  She does not take naps.  She uses her BiPAP and oxygen for the entire time she is asleep.  She is not using anything to help her stay awake or fall asleep.  She has not received any new BiPAP supplies for several years.  She has not had contact with her DME for several years.  TESTS: PSG 06/08/04 >> RDI 92.1, oxygen nadir 57% ONO on RA 08/03/13 >> 203 min with SpO2 < 88% Echo 08/22/13 >> EF 60 to 65%, PAS 29 mmHg  Leslie Gates  has a past medical history of Pulmonary HTN; OSA (obstructive sleep apnea); Diastolic CHF, chronic; HTN (hypertension); Human immunodeficiency virus (HIV) (2001); Wrist fracture, bilateral; History of herpes zoster; History of cervical dysplasia; Dysphagia; Chronic otitis media; Allergic rhinitis; Obesity; and Coronary artery disease.  Leslie Gates  has past surgical history that includes Tympanostomy tube placement; Cesarean section; Nasal sinus surgery (2004); Wrist fracture surgery (2006); and Adenoidectomy.  Prior to Admission medications   Medication Sig Start Date End Date Taking? Authorizing Provider  amLODipine (NORVASC) 10 MG tablet Take 1 tablet (10 mg total) by mouth daily. 07/26/13  Yes Dolores Patty, MD  cetirizine (ZYRTEC) 10 MG tablet Take 1 tablet (10 mg total) by mouth daily. 09/22/12  Yes Judyann Munson, MD  elvitegravir-cobicistat-emtricitabine-tenofovir (STRIBILD) 150-150-200-300 MG TABS  tablet Take 1 tablet by mouth daily with breakfast. 09/26/13  Yes Judyann Munson, MD  furosemide (LASIX) 20 MG tablet Take 20 mg by mouth daily.    Yes Historical Provider, MD  loratadine (CLARITIN) 10 MG tablet Take 1 tablet (10 mg total) by mouth daily. 06/30/12  Yes Randall Hiss, MD  Multiple Vitamin (MULTIVITAMIN) tablet Take 1 tablet by mouth daily. 03/01/13  Yes Ginnie Smart, MD  PROAIR HFA 108 [phone] BASE) MCG/ACT inhaler INHALE 1 PUFF INTO THE LUNGS EVERY 6 HOURS AS NEEDED. 10/11/13  Yes Kalman Shan, MD  spironolactone (ALDACTONE) 25 MG tablet Take 25 mg by mouth daily.    Yes Historical Provider, MD    Allergies  Allergen Reactions  . Lisinopril Swelling    Very swollen bottom lip     Physical Exam:  General - No distress ENT - No sinus tenderness, no oral exudate, MP 4, scalloped tongue, no LAN Cardiac - s1s2 regular, no murmur Chest - No wheeze/rales/dullness Back - No focal tenderness Abd - Soft, non-tender Ext - No edema Neuro - Normal strength Skin - No rashes Psych - normal mood, and behavior   Assessment/Plan:  Coralyn Helling, MD [facility] Pager:  [phone]

## 2013-10-16 NOTE — Assessment & Plan Note (Signed)
She has history of severe sleep apnea, and has been on BiPAP and supplemental oxygen.  She has more difficulty with her sleep and set up.  She has lost significant weight since her original set up.  Will arrange for repeat sleep study to further assess status of sleep apnea, and then determine what changes she needs to her set up.  She is to continue BiPAP and supplemental oxygen at night for now.  She will need to set up with new DME.

## 2013-10-16 NOTE — Patient Instructions (Signed)
Will arrange for sleep study Will call to arrange for follow up after sleep study reviewed 

## 2013-10-16 NOTE — Assessment & Plan Note (Signed)
Her recent ONO showed continue oxygen desaturation while asleep.  Will re-assess her supplemental oxygen needs after review of her sleep study.

## 2013-10-17 ENCOUNTER — Other Ambulatory Visit: Payer: Self-pay | Admitting: Licensed Clinical Social Worker

## 2013-10-17 DIAGNOSIS — L299 Pruritus, unspecified: Secondary | ICD-10-CM

## 2013-10-17 MED ORDER — CETIRIZINE HCL 10 MG PO TABS: 10.0000 mg | ORAL_TABLET | Freq: Every day | ORAL | Status: DC

## 2013-10-23 ENCOUNTER — Other Ambulatory Visit: Payer: Self-pay | Admitting: *Deleted

## 2013-10-23 DIAGNOSIS — B20 Human immunodeficiency virus [HIV] disease: Secondary | ICD-10-CM

## 2013-10-23 MED ORDER — SULFAMETHOXAZOLE-TRIMETHOPRIM 400-80 MG PO TABS: 1.0000 | ORAL_TABLET | Freq: Two times a day (BID) | ORAL | Status: DC

## 2013-10-23 MED ORDER — ELVITEG-COBIC-EMTRICIT-TENOFDF 150-150-200-300 MG PO TABS: 1.0000 | ORAL_TABLET | Freq: Every day | ORAL | Status: DC

## 2013-10-24 ENCOUNTER — Other Ambulatory Visit: Payer: Self-pay | Admitting: *Deleted

## 2013-10-24 ENCOUNTER — Other Ambulatory Visit: Payer: Medicaid Other

## 2013-10-24 DIAGNOSIS — B20 Human immunodeficiency virus [HIV] disease: Secondary | ICD-10-CM

## 2013-10-24 LAB — CBC WITH DIFFERENTIAL/PLATELET
Basophils Absolute: 0 10*3/uL (ref 0.0–0.1)
Eosinophils Absolute: 0 10*3/uL (ref 0.0–0.7)
Eosinophils Relative: 1 % (ref 0–5)
Lymphocytes Relative: 24 % (ref 12–46)
Lymphs Abs: 1.2 10*3/uL (ref 0.7–4.0)
MCH: 28.5 pg (ref 26.0–34.0)
MCV: 84.7 fL (ref 78.0–100.0)
Neutrophils Relative %: 66 % (ref 43–77)
Platelets: 107 10*3/uL — ABNORMAL LOW (ref 150–400)
RDW: 13.5 % (ref 11.5–15.5)
WBC: 4.8 10*3/uL (ref 4.0–10.5)

## 2013-10-24 LAB — COMPREHENSIVE METABOLIC PANEL
AST: 40 U/L — ABNORMAL HIGH (ref 0–37)
Alkaline Phosphatase: 64 U/L (ref 39–117)
BUN: 14 mg/dL (ref 6–23)
Calcium: 9.9 mg/dL (ref 8.4–10.5)
Chloride: 100 mEq/L (ref 96–112)
Glucose, Bld: 87 mg/dL (ref 70–99)
Potassium: 3.7 mEq/L (ref 3.5–5.3)
Total Bilirubin: 0.3 mg/dL (ref 0.3–1.2)

## 2013-10-24 MED ORDER — SULFAMETHOXAZOLE-TRIMETHOPRIM 400-80 MG PO TABS: 1.0000 | ORAL_TABLET | Freq: Two times a day (BID) | ORAL | Status: DC

## 2013-10-25 ENCOUNTER — Telehealth: Payer: Self-pay | Admitting: *Deleted

## 2013-10-25 LAB — HIV-1 RNA QUANT-NO REFLEX-BLD
HIV 1 RNA Quant: 67 copies/mL — ABNORMAL HIGH (ref <20)
HIV-1 RNA Quant, Log: 1.83 {Log} — ABNORMAL HIGH (ref <1.30)

## 2013-10-25 LAB — T-HELPER CELL (CD4) - (RCID CLINIC ONLY): CD4 % Helper T Cell: 15 % — ABNORMAL LOW (ref 33–55)

## 2013-10-25 NOTE — Telephone Encounter (Signed)
Patient called c/o diarrhea x 3 days, said it happens every time she eats. Requested appt, scheduled for 10/26/13. Wendall Mola

## 2013-10-26 ENCOUNTER — Ambulatory Visit (INDEPENDENT_AMBULATORY_CARE_PROVIDER_SITE_OTHER): Payer: Medicaid Other | Admitting: Internal Medicine

## 2013-10-26 ENCOUNTER — Encounter: Payer: Self-pay | Admitting: Internal Medicine

## 2013-10-26 VITALS — BP 114/77 | HR 99 | Temp 97.7°F | Ht 62.0 in | Wt 163.0 lb

## 2013-10-26 DIAGNOSIS — B20 Human immunodeficiency virus [HIV] disease: Secondary | ICD-10-CM

## 2013-10-26 MED ORDER — LOPERAMIDE-SIMETHICONE 2-125 MG PO CHEW: 1.0000 | CHEWABLE_TABLET | Freq: Two times a day (BID) | ORAL | Status: DC

## 2013-10-26 NOTE — Assessment & Plan Note (Signed)
I think this is likely related to her HIV and poor immune system. I emphatically reminded her to not stop her medication since it is having a good effect. Will return with her visit with Dr. Drue Second.

## 2013-10-26 NOTE — Progress Notes (Signed)
   Subjective:    Patient ID: Leslie Gates, female    DOB: 08-Mar-1966, 47 y.o.   MRN: [id number]  HPI She comes in here for a work in visit. She started Stribild about one month ago after not being on medication to 2 swallowing difficulties. She was started on Stribild and has been taking that in applesauce after crushing. Back she has had recent labs did show improvement in her viral load. She though began to develop diarrhea about 3 days ago. She is concerned that the medication is causing it. She did miss one dose of the medication because of the diarrhea. She has also had some nausea. She does have medication for mild nausea and is taking it. No blood in her diarrhea. She does take an organic fruit juice that she mixes.   Review of Systems  Constitutional: Negative for fever and chills.  Gastrointestinal: Positive for nausea and diarrhea. Negative for abdominal pain and abdominal distention.  Skin: Negative for rash.       Objective:   Physical Exam  Constitutional: She appears well-developed and well-nourished. No distress.  Cardiovascular: Normal rate, regular rhythm and normal heart sounds.   No murmur heard. Pulmonary/Chest: Effort normal and breath sounds normal. No respiratory distress.  Abdominal: Bowel sounds are normal. She exhibits no distension. There is no tenderness.  Skin: No rash noted.          Assessment & Plan:

## 2013-10-31 ENCOUNTER — Telehealth: Payer: Self-pay | Admitting: *Deleted

## 2013-10-31 ENCOUNTER — Ambulatory Visit: Payer: Medicaid Other | Admitting: Internal Medicine

## 2013-10-31 NOTE — Telephone Encounter (Signed)
Called patient about her missed appt and she advised she was just seen last week and did not feel she needed this appt. She advised she called this morning. I apologized and advised her glad she is feeling better and we will see her at her next visit.

## 2013-11-14 ENCOUNTER — Telehealth: Payer: Self-pay | Admitting: *Deleted

## 2013-11-14 NOTE — Telephone Encounter (Signed)
Called the patient to reschedule her missed appt as she is on the detectable list had to leave a message for her to call back.

## 2013-11-15 ENCOUNTER — Encounter: Payer: Self-pay | Admitting: Family Medicine

## 2013-11-15 ENCOUNTER — Ambulatory Visit (INDEPENDENT_AMBULATORY_CARE_PROVIDER_SITE_OTHER): Payer: Medicaid Other | Admitting: Family Medicine

## 2013-11-15 VITALS — BP 125/80 | HR 69 | Temp 97.7°F | Ht 63.0 in | Wt 172.0 lb

## 2013-11-15 DIAGNOSIS — J309 Allergic rhinitis, unspecified: Secondary | ICD-10-CM

## 2013-11-15 DIAGNOSIS — Z7689 Persons encountering health services in other specified circumstances: Secondary | ICD-10-CM

## 2013-11-15 DIAGNOSIS — Z7189 Other specified counseling: Secondary | ICD-10-CM

## 2013-11-15 NOTE — Patient Instructions (Signed)
It was nice to meet you today!  I will get in touch with Dr. Ilsa Iha about whether flonase is okay for you to take. If it is okay, I will send in a prescription for you and you should follow up with me in 2 months.  Be well, Dr. Pollie Meyer

## 2013-11-15 NOTE — Progress Notes (Signed)
Patient ID: MLISS WEDIN, female   DOB: October 19, 1966, 47 y.o.   MRN: [id number]   HPI:  Patient presents today for a new patient appointment to establish general primary care. She has no presenting complaints today. We did elect to discuss nasal congestion.  Nasal congestion: currently taking claritin. Has strong personal hx of seasonal allergies. Used to take nasal spray (thinks flonase), states this was provided to her by Dr. Drue Second but for some reason she isn't getting it anymore. Still frequently has nasal congestion.  ROS: See HPI. ROS positive for: chest pain, fast/irregular heartbeat, cough, nausea, diarrhea, constipation, numbness, excessive thirst. (Advised pt we could not address all these issues today. Inquired briefly further about chest pain - she states the chest pain is stable and has been evaluated by & is being followed by cardiology & pulm)  Past Medical Hx:  HIV - followed by Dr. Drue Second Pulmonary HTN - followed by Dr. Gala Romney Sleep apnea - followed by Dr. Craige Cotta, on CPAP & O2 at night Asthma - exercise induced HTN - takes norvasc, lasix, spironolactone HLD - last had lipids checked in July 2014, LDL 90  Past Surgical Hx: tubal ligation according to pt (note this has not been noted in epic?)  Family Hx: updated in Epic  Social Hx: Unemployed. High school graduate. Lives with son (age 18). Uses bus to get to appointments. No exercise. No tobacco, alcohol, or drugs. Feels safe in relationships. Denies mood problems.  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 Clearance Coots) earlier this year and is supposed to return for cryocautery of cervix.  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  # See also problem based charting.  FOLLOW UP: F/u in 2 months to evaluate improvement of allergic rhinitis, also to check in regarding developments in specialist care (as pt is seeing so many specialists).  [place] J. Pollie Meyer, MD [facility]

## 2013-11-19 ENCOUNTER — Encounter: Payer: Self-pay | Admitting: Family Medicine

## 2013-11-19 NOTE — Assessment & Plan Note (Signed)
Sx's not well controlled with claritin. Would like to start flonase, however need to check first with pt's ID provider to ensure no potential interactions with her HIV meds. Will message Dr. Drue SecondSnider and will call in rx for flonase if she feels it is safe.

## 2013-11-26 ENCOUNTER — Ambulatory Visit (HOSPITAL_BASED_OUTPATIENT_CLINIC_OR_DEPARTMENT_OTHER): Payer: Medicaid Other | Attending: Pulmonary Disease

## 2013-11-27 ENCOUNTER — Telehealth: Payer: Self-pay | Admitting: Pulmonary Disease

## 2013-11-27 NOTE — Telephone Encounter (Signed)
Spoke with the pt  She states that she is needing a letter stating that due to her OSA, she is unable to work for at least 3 yrs She had to cancel PSG for last month and is rescheduled for 12/22/13 Please advise, thanks!

## 2013-11-28 ENCOUNTER — Encounter (HOSPITAL_BASED_OUTPATIENT_CLINIC_OR_DEPARTMENT_OTHER): Payer: Medicaid Other

## 2013-11-28 NOTE — Telephone Encounter (Signed)
I spoke with the patient to clarify what is needed. She states she needs a letter for disability not for work. She is currently on disability for heart failure and pulmonary hypertension. She states she needs a letter stating that she will be on disability long term due to these issues. I advised that she needs to contact cardiology for the heart failure part but that I will send a message to MR to see if he is willing to complete a letter for the pt. Please advise. Carron CurieJennifer Lorijean Husser, CMA

## 2013-11-28 NOTE — Telephone Encounter (Signed)
Please ask the patient to clarify what type of working she is doing that would have sleep apnea as a restriction to return to work for 3 years.

## 2013-12-01 NOTE — Telephone Encounter (Signed)
Her pulm htn resolved per right heart cath in 2013. She can try on basis of HIV with ID clinic. I had dc'ed her frmo fu with me sept 2014

## 2013-12-05 NOTE — Telephone Encounter (Signed)
Pt advised. Leslie Gates, CMA  

## 2013-12-22 ENCOUNTER — Ambulatory Visit (HOSPITAL_BASED_OUTPATIENT_CLINIC_OR_DEPARTMENT_OTHER): Payer: Medicaid Other | Attending: Pulmonary Disease | Admitting: Radiology

## 2013-12-22 VITALS — Ht 62.0 in | Wt 187.0 lb

## 2013-12-22 DIAGNOSIS — G4761 Periodic limb movement disorder: Secondary | ICD-10-CM | POA: Insufficient documentation

## 2013-12-22 DIAGNOSIS — G4733 Obstructive sleep apnea (adult) (pediatric): Secondary | ICD-10-CM | POA: Insufficient documentation

## 2013-12-25 ENCOUNTER — Telehealth: Payer: Self-pay | Admitting: Pulmonary Disease

## 2013-12-25 DIAGNOSIS — G4733 Obstructive sleep apnea (adult) (pediatric): Secondary | ICD-10-CM

## 2013-12-25 NOTE — Sleep Study (Signed)
[facility]  NAME: Leslie Gates DATE OF BIRTH:  1966/06/15 MEDICAL RECORD NUMBER [id number]  LOCATION: [facility]  PHYSICIAN: Coralyn HellingVineet Cuca Benassi, M.D. DATE OF STUDY: 12/22/2013  SLEEP STUDY TYPE: Split night protocol               REFERRING PHYSICIAN: Coralyn HellingSood, Marg Macmaster, MD  INDICATION FOR STUDY:  48 year old female with history of obstructive sleep apnea and sleep related hypoventilation.  She had sleep study from 06/08/04 with RDI of 92.1 and SaO2 low of 97%.  She has been on BiPAP and supplemental oxygen.  She has lost significant weight since initial set up.  She is referred back to sleep lab to further assess hypersomnia with obstructive sleep apnea.    EPWORTH SLEEPINESS SCORE: 14. HEIGHT: 5\' 2"  (157.5 cm)  WEIGHT: 187 lb (84.823 kg)    Body mass index is 34.19 kg/(m^2).  NECK SIZE: 13 in.  MEDICATIONS:  Current Outpatient Prescriptions on File Prior to Visit  Medication Sig Dispense Refill  . amLODipine (NORVASC) 10 MG tablet Take 1 tablet (10 mg total) by mouth daily.  90 tablet  3  . cetirizine (ZYRTEC) 10 MG tablet Take 1 tablet (10 mg total) by mouth daily.  30 tablet  11  . elvitegravir-cobicistat-emtricitabine-tenofovir (STRIBILD) 150-150-200-300 MG TABS tablet Take 1 tablet by mouth daily with breakfast.  30 tablet  11  . furosemide (LASIX) 20 MG tablet Take 20 mg by mouth daily.       . Loperamide-Simethicone 2-125 MG CHEW Chew 1 tablet by mouth 2 (two) times daily.  30 each  1  . loratadine (CLARITIN) 10 MG tablet Take 1 tablet (10 mg total) by mouth daily.  30 tablet  4  . Multiple Vitamin (MULTIVITAMIN) tablet Take 1 tablet by mouth daily.  30 tablet  6  . PROAIR HFA 108 (90 BASE) MCG/ACT inhaler INHALE 1 PUFF INTO THE LUNGS EVERY 6 HOURS AS NEEDED.  8.5 each  1  . spironolactone (ALDACTONE) 25 MG tablet Take 25 mg by mouth daily.       Marland Kitchen. sulfamethoxazole-trimethoprim (SEPTRA) 400-80 MG per tablet Take 1 tablet by mouth 2 (two) times  daily.  60 tablet  5   No current facility-administered medications on file prior to visit.    SLEEP ARCHITECTURE:  Diagnostic protion: Total recording time: 207 minutes.  Total sleep time was: 126.5 minutes.  Sleep efficiency: 61.1%.  Sleep latency: 11 minutes.  REM latency: 100 minutes.  Stage N1: 13.4%.  Stage N2: 75.5%.  Stage N3: 0%.  Stage R:  11.1%.  Supine sleep: 40 minutes.  Non-supine sleep: 86.5 minutes.  Therapeutic portion: Total recording time: 226.5 minutes.  Total sleep time was: 216 minutes.  Sleep efficiency: 95.4%.  Sleep latency: 1 minutes.  REM latency: 43.5 minutes.  Stage N1: 1.9%.  Stage N2: 71.8%.  Stage N3: 0%.  Stage R:  26.4%.  Supine sleep: 214 minutes.  Non-supine sleep: 157 minutes.  RESPIRATORY DATA: Average respiratory rate: 22. Snoring: Moderate.  Diagnostic portion: Average AHI: 27.   Apnea index: 7.1.  Hypopnea index: 19.9. Obstructive apnea index: 7.1.  Central apnea index: 0.  Mixed apnea index: 0. REM AHI: 68.6.  NREM AHI: 21.9. Supine AHI: 42. Non-supine AHI: 20.1.  Titration portion: Started from CPAP 5 cm H2O and increased to CPAP 13 H2O.  With CPAP at 12 cm H2O her AHI was decreased to 5.9.  At this pressure setting she was observed in  REM and supine sleep.  She was fitted with Resmed airfit F 10 medium size full face mask.  OXYGEN DATA:  Baseline oxygenation: 95%. Lowest SaO2: 77%. She was placed on 1 liter supplemental oxygen during the diagnostic portion per sleep lab protocol.  She had good control of her oxygen with CPAP 12 cm H2O, and did not require supplemental oxygen during the titration portion of the study.  CARDIAC DATA:  Average heart rate: 67 beats per minute. Rhythm strip: sinus rhythm.  MOVEMENT/PARASOMNIA:  Diagnostic portion: Periodic limb movement: 2.4.  Period limb movements with arousals: 0.5.  Therapeutic portion: Periodic limb movement: 13.3.  Period limb movements with arousals: 0.  Restroom trips:  one.  IMPRESSION/ RECOMMENDATION:   This study showed moderate obstructive sleep apnea with an AHI of 27 and SaO2 low of 77%.  She had a significant REM and positional affect to her sleep apnea.  Of note is that she did not require supplemental oxygen during the titration portion of the study.  She had a successful titration portion of the study.  She did well with CPAP 12 cm H2O.   Recommend she be started on CPAP 12 cm H2O and monitored for her clinical response.   Coralyn Helling, M.D. Diplomate, Biomedical engineer of Sleep Medicine  ELECTRONICALLY SIGNED ON:  12/25/2013, 4:19 PM [facility] PH: [phone]   FX: [phone] ACCREDITED BY THE [employer]ICAN ACADEMY OF SLEEP MEDICINE

## 2013-12-25 NOTE — Telephone Encounter (Signed)
Split night 12/22/13 >> AHI 27, SaO2 low 77%.  CPAP 12 cm H2O >> AHI 5.9, +R, +S.  Did not need supplemental oxygen.  Will have my nurse inform pt that sleep study shows moderate sleep apnea.  Better than before, but still has sleep apnea.  She did well with CPAP 12 cm H2O.  She did not need supplemental oxygen.  Options are 1) to proceed with CPAP set up now and have ROV 2 months after CPAP set up, or 2) have ROV first.  If pt is agreeable to CPAP set up now, then send order to have her DME set up CPAP 12 cm H2O with heated humidity and mask of choice.  Please also arrange for ONO with CPAP and room air to ensure that she does not need supplemental oxygen at night.

## 2013-12-26 NOTE — Telephone Encounter (Signed)
lmtcb x1 

## 2013-12-29 NOTE — Telephone Encounter (Signed)
Pt is aware of results. Order will be placed with [facility].

## 2014-01-03 ENCOUNTER — Other Ambulatory Visit: Payer: Self-pay | Admitting: *Deleted

## 2014-01-03 ENCOUNTER — Encounter: Payer: Self-pay | Admitting: Internal Medicine

## 2014-01-03 MED ORDER — ONE-DAILY MULTI VITAMINS PO TABS: 1.0000 | ORAL_TABLET | Freq: Every day | ORAL | Status: DC

## 2014-01-04 ENCOUNTER — Other Ambulatory Visit: Payer: Self-pay | Admitting: Internal Medicine

## 2014-01-29 ENCOUNTER — Ambulatory Visit (INDEPENDENT_AMBULATORY_CARE_PROVIDER_SITE_OTHER): Payer: Medicaid Other | Admitting: Internal Medicine

## 2014-01-29 ENCOUNTER — Other Ambulatory Visit: Payer: Medicaid Other

## 2014-01-29 ENCOUNTER — Ambulatory Visit (INDEPENDENT_AMBULATORY_CARE_PROVIDER_SITE_OTHER): Payer: Medicaid Other | Admitting: Pulmonary Disease

## 2014-01-29 ENCOUNTER — Encounter: Payer: Self-pay | Admitting: Internal Medicine

## 2014-01-29 VITALS — BP 120/80 | HR 73 | Ht 61.5 in | Wt 180.0 lb

## 2014-01-29 DIAGNOSIS — R0989 Other specified symptoms and signs involving the circulatory and respiratory systems: Secondary | ICD-10-CM

## 2014-01-29 DIAGNOSIS — B20 Human immunodeficiency virus [HIV] disease: Secondary | ICD-10-CM

## 2014-01-29 DIAGNOSIS — R0609 Other forms of dyspnea: Secondary | ICD-10-CM

## 2014-01-29 DIAGNOSIS — R0600 Dyspnea, unspecified: Secondary | ICD-10-CM

## 2014-01-29 DIAGNOSIS — Z79899 Other long term (current) drug therapy: Secondary | ICD-10-CM

## 2014-01-29 DIAGNOSIS — Z113 Encounter for screening for infections with a predominantly sexual mode of transmission: Secondary | ICD-10-CM

## 2014-01-29 DIAGNOSIS — I2789 Other specified pulmonary heart diseases: Secondary | ICD-10-CM

## 2014-01-29 DIAGNOSIS — R0602 Shortness of breath: Secondary | ICD-10-CM

## 2014-01-29 LAB — PULMONARY FUNCTION TEST
DL/VA % pred: 101 %
DL/VA: 4.51 ml/min/mmHg/L
DLCO UNC % PRED: 71 %
DLCO unc: 15.01 ml/min/mmHg
FEF 25-75 POST: 2.12 L/s
FEF 25-75 Pre: 1.61 L/sec
FEF2575-%CHANGE-POST: 31 %
FEF2575-%PRED-POST: 87 %
FEF2575-%Pred-Pre: 66 %
FEV1-%Change-Post: 6 %
FEV1-%PRED-POST: 81 %
FEV1-%PRED-PRE: 77 %
FEV1-PRE: 1.68 L
FEV1-Post: 1.79 L
FEV1FVC-%Change-Post: 5 %
FEV1FVC-%PRED-PRE: 99 %
FEV6-%Change-Post: 1 %
FEV6-%Pred-Post: 78 %
FEV6-%Pred-Pre: 78 %
FEV6-Post: 2.07 L
FEV6-Pre: 2.05 L
FEV6FVC-%CHANGE-POST: 0 %
FEV6FVC-%PRED-PRE: 102 %
FEV6FVC-%Pred-Post: 103 %
FVC-%Change-Post: 0 %
FVC-%Pred-Post: 77 %
FVC-%Pred-Pre: 76 %
FVC-Post: 2.07 L
FVC-Pre: 2.06 L
POST FEV1/FVC RATIO: 86 %
PRE FEV1/FVC RATIO: 82 %
PRE FEV6/FVC RATIO: 99 %
Post FEV6/FVC ratio: 100 %
RV % pred: 80 %
RV: 1.28 L
TLC % pred: 74 %
TLC: 3.47 L

## 2014-01-29 LAB — COMPREHENSIVE METABOLIC PANEL
ALBUMIN: 4.2 g/dL (ref 3.5–5.2)
ALT: 16 U/L (ref 0–35)
AST: 21 U/L (ref 0–37)
Alkaline Phosphatase: 61 U/L (ref 39–117)
BUN: 13 mg/dL (ref 6–23)
CALCIUM: 9.9 mg/dL (ref 8.4–10.5)
CHLORIDE: 101 meq/L (ref 96–112)
CO2: 29 meq/L (ref 19–32)
Creat: 0.73 mg/dL (ref 0.50–1.10)
GLUCOSE: 100 mg/dL — AB (ref 70–99)
POTASSIUM: 4.2 meq/L (ref 3.5–5.3)
SODIUM: 136 meq/L (ref 135–145)
Total Bilirubin: 0.2 mg/dL (ref 0.2–1.2)
Total Protein: 8.5 g/dL — ABNORMAL HIGH (ref 6.0–8.3)

## 2014-01-29 LAB — CBC WITH DIFFERENTIAL/PLATELET
Basophils Absolute: 0 K/uL (ref 0.0–0.1)
Basophils Relative: 2 % — ABNORMAL HIGH (ref 0–1)
Eosinophils Absolute: 0.1 K/uL (ref 0.0–0.7)
Eosinophils Relative: 3 % (ref 0–5)
HCT: 35.4 % — ABNORMAL LOW (ref 36.0–46.0)
Hemoglobin: 12 g/dL (ref 12.0–15.0)
Lymphocytes Relative: 43 % (ref 12–46)
Lymphs Abs: 0.9 K/uL (ref 0.7–4.0)
MCH: 29.4 pg (ref 26.0–34.0)
MCHC: 33.9 g/dL (ref 30.0–36.0)
MCV: 86.8 fL (ref 78.0–100.0)
Monocytes Absolute: 0.2 K/uL (ref 0.1–1.0)
Monocytes Relative: 12 % (ref 3–12)
Neutro Abs: 0.8 K/uL — ABNORMAL LOW (ref 1.7–7.7)
Neutrophils Relative %: 40 % — ABNORMAL LOW (ref 43–77)
Platelets: 246 K/uL (ref 150–400)
RBC: 4.08 MIL/uL (ref 3.87–5.11)
RDW: 14.3 % (ref 11.5–15.5)
WBC: 2 K/uL — ABNORMAL LOW (ref 4.0–10.5)

## 2014-01-29 LAB — LIPID PANEL
Cholesterol: 167 mg/dL (ref 0–200)
HDL: 54 mg/dL (ref >39)
LDL CALC: 77 mg/dL (ref 0–99)
Total CHOL/HDL Ratio: 3.1 Ratio
Triglycerides: 182 mg/dL — ABNORMAL HIGH (ref <150)
VLDL: 36 mg/dL (ref 0–40)

## 2014-01-29 NOTE — Progress Notes (Signed)
PFT done today. 

## 2014-01-29 NOTE — Addendum Note (Signed)
Addended by: Mariea ClontsGREEN, Charlize Hathaway D on: 01/29/2014 02:19 PM   Modules accepted: Orders

## 2014-01-29 NOTE — Patient Instructions (Addendum)
#  Shortness of Breath  - some due to deconditioning, weight, and diastolic dysfunction  - refer pulmonary rehabilitation - If no improvement then we'll do cardiopulmonary stress test  #Sleep apnea - follow up with DR Craige CottaSood; [facility]  #HIV  - you cannot skip appointment with DR Snider;keep up with that. Glad you started seeing them  #FOllowup  =1st available with Dr Craige CottaSood -= refer to pulmonary rehabilitation [facility]

## 2014-01-29 NOTE — Progress Notes (Signed)
Subjective:    Patient ID: Leslie Gates, female    DOB: 1966-03-13, 48 y.o.   MRN: [id number]  HPI  #HIV - since 2000  - non compliant; last ID visit fall 2012, sept 2014 - compliant again - march 2015 through ID clinic   #Obesity - BMI 34.22 Aug 2011  - Body mass index is 34.02 kg/(m^2). - Oct 2013 - Body mass index is 32.18 kg/(m^2). - 08/01/2013   #OSA. -> Dr Craige Cotta  - since 2003  - Dr Craige Cotta since August 2011  - Nov 2011: DR Craige Cotta has noticed that CPAP was not working and changed to auto BIPAP 25-5  - complant with bipap as of oct 2013 and march 2015  #Diastolic Dysfunction  - Has  ECHO June 2011 with Grade 2 diast dysfunction  #Pulmonary Hypertension - Rt heart cath on 06/23/2010. PA meanis 60, PCWP is 25-30, TRANPULM GRADIENT - 30, CI is 2.3L, and PVR is 7.1 [facility].  - Spironlactone was started. AFter this dyspnea and edema has improved - RHC 02/01/12:  -Findings: RA = 6 , RV = 39/4/8 , PA = 42/14 (25) , PCW = 9 , Fick cardiac output/index = 6.2/3.2 , PVR = 2.5 Woods , FA sat = 96%  PA sat = 72%, 73% . Assessment: 1. Minimal PAH with almost complete resolution of pulmonary HTN. 2. Well compensated left-side pressures  Plan/Discussion:  Continue current therapy.   #Dyspnea (multifactorial)   - Cass 3 dyspnea with desaturation at 185 feet of walkingon room air in July 2011.  -  PFTs Spirometry June 2011 suggestive of restriction,   - CT Chest June 201 without evidence of PE or parencymal disease.   - ABG on room air from May 13, 2010: pH 7.42, PaCO2 56, PaO2 59.  - Jan 2012: Still with class 3 dyspnea. Desaturated to 88% after walking 155 feet x 2 laps but not using o2 at night - September 2014: Dyspnea resolved did not desaturate on walking 185 feet x3 laps on room air - March 2015: Class 1 dyspnea   OV 08/01/2013  Followup for dyspnea multifactorial. I last saw her in April 2014 at that time she stable. Subsequently she did see cardiology in June 2014 and was  noticed to be euvolemic. They recommended continuing her oxygen and CPAP at followup echo recommended in 3 months. Today she tells me that her dyspnea has resolved. She no longer has cough, chest pain, edema, paroxysmal nocturnal dyspnea, orthopnea, wheeze, hemoptysis. In fact Walking desaturation test 108 5 feet x3 laps on room air: Did not desaturate. Lowest pulse ox at the end of the third lap was 95%. Peak heart rate was 111 per minute and she did not have dyspnea.   Past, Family, Social reviewed: no change since last visit but  I do note that she has not attended the HIV clinic since February 2014. Infact  not taking her HIV meds. She also has not followed up with sleep specialist Dr Coralyn Helling  #Shortness of Breath  - resolved  - you do not need oxygen in day time - we will test you at night for oxygen to see you if you need oxygen at night anymore - no more followup with DR Marchelle Gearing  #Sleep apnea - follow up with DR Craige Cotta; [facility] to make appointment  #HIV  - you cannot skip appointment with DR Drue Second; [facility] Will make you one  #FOllowup  =1st available with Dr Craige Cotta -= no followup  with DR Marchelle Gearing [facility] to make appt for ID Clinic  OV 01/29/2014  Chief Complaint  Patient presents with  . Follow-up    Pt states she still has some SOB but this is not worse then its been.  Pt here to discuss PFT results.    48 year old female with previous pulmonary hypertension do to and related to HIV disease but resolved in 2013 based on right heart cath values. She did have dyspnea on the basis of obesity, deconditioning and diastolic dysfunction. But I last saw her in September 2014 dyspnea resolved and her discharge her from followup. Her only remaining pulmonary issue was sleep apnea. She did see Dr. Craige Cotta who repeated a sleep study which showed persistent but improved sleep apnea and advised her to be compliant with her noninvasive ventilation at night. Since then she's not seen him. It is unclear to  me why she is back today in the office but most likely it is because her dyspnea has returned although she is completely vague about it. Dyspnea is currently class I exertional and relieved by rest. Occurs only with heavy exercises. She says that she might be overdoing exercise. She continues to be obese with a body mass index of 33. She's opened attending pulmonary rehabilitation.  Past medical history reviewed: She is now compliant with her HIV meds and is being seen by  Dr. Ferne Reus at [facility]. She says that she has followup pending    Review of Systems  Constitutional: Negative for fever and unexpected weight change.  HENT: Negative for congestion, dental problem, ear pain, nosebleeds, postnasal drip, rhinorrhea, sinus pressure, sneezing, sore throat and trouble swallowing.   Eyes: Negative for redness and itching.  Respiratory: Positive for cough and shortness of breath. Negative for chest tightness and wheezing.   Cardiovascular: Negative for palpitations and leg swelling.  Gastrointestinal: Negative for nausea and vomiting.  Genitourinary: Negative for dysuria.  Musculoskeletal: Negative for joint swelling.  Skin: Negative for rash.  Neurological: Negative for headaches.  Hematological: Does not bruise/bleed easily.  Psychiatric/Behavioral: Negative for dysphoric mood. The patient is not nervous/anxious.       Current outpatient prescriptions:amLODipine (NORVASC) 10 MG tablet, Take 1 tablet (10 mg total) by mouth daily., Disp: 90 tablet, Rfl: 3;  cetirizine (ZYRTEC) 10 MG tablet, Take 1 tablet (10 mg total) by mouth daily., Disp: 30 tablet, Rfl: 11;  elvitegravir-cobicistat-emtricitabine-tenofovir (STRIBILD) 150-150-200-300 MG TABS tablet, Take 1 tablet by mouth daily with breakfast., Disp: 30 tablet, Rfl: 11 loratadine (CLARITIN) 10 MG tablet, Take 1 tablet (10 mg total) by mouth daily., Disp: 30 tablet, Rfl: 4;  Multiple Vitamin (MULTIVITAMIN) tablet, Take 1 tablet by mouth  daily., Disp: 30 tablet, Rfl: 6;  PROAIR HFA 108 (90 BASE) MCG/ACT inhaler, INHALE 1 PUFF INTO THE LUNGS EVERY 6 HOURS AS NEEDED., Disp: 8.5 each, Rfl: 0 sulfamethoxazole-trimethoprim (SEPTRA) 400-80 MG per tablet, Take 1 tablet by mouth 2 (two) times daily., Disp: 60 tablet, Rfl: 5;  furosemide (LASIX) 20 MG tablet, Take 20 mg by mouth daily. , Disp: , Rfl:   Objective:   Physical Exam  Vitals reviewed. Constitutional: She is oriented to person, place, and time. She appears well-developed and well-nourished. No distress.  Body mass index is 33.46 kg/(m^2).   HENT:  Head: Normocephalic and atraumatic.  Right Ear: External ear normal.  Left Ear: External ear normal.  Mouth/Throat: Oropharynx is clear and moist. No oropharyngeal exudate.  Eyes: Conjunctivae and EOM are normal. Pupils are equal, round,  and reactive to light. Right eye exhibits no discharge. Left eye exhibits no discharge. No scleral icterus.  Neck: Normal range of motion. Neck supple. No JVD present. No tracheal deviation present. No thyromegaly present.  Cardiovascular: Normal rate, regular rhythm, normal heart sounds and intact distal pulses.  Exam reveals no gallop and no friction rub.   No murmur heard. Pulmonary/Chest: Effort normal and breath sounds normal. No respiratory distress. She has no wheezes. She has no rales. She exhibits no tenderness.  Abdominal: Soft. Bowel sounds are normal. She exhibits no distension and no mass. There is no tenderness. There is no rebound and no guarding.  Musculoskeletal: Normal range of motion. She exhibits no edema and no tenderness.  Lymphadenopathy:    She has no cervical adenopathy.  Neurological: She is alert and oriented to person, place, and time. She has normal reflexes. No cranial nerve deficit. She exhibits normal muscle tone. Coordination normal.  Skin: Skin is warm and dry. No rash noted. She is not diaphoretic. No erythema. No pallor.  Psychiatric: She has a normal mood and  affect. Her behavior is normal. Judgment and thought content normal.          Assessment & Plan:

## 2014-01-30 LAB — HIV-1 RNA QUANT-NO REFLEX-BLD

## 2014-01-30 LAB — RPR

## 2014-01-30 LAB — T-HELPER CELL (CD4) - (RCID CLINIC ONLY)
CD4 % Helper T Cell: 20 % — ABNORMAL LOW (ref 33–55)
CD4 T CELL ABS: 200 /uL — AB (ref 400–2700)

## 2014-02-03 NOTE — Assessment & Plan Note (Addendum)
#  Shortness of Breath  - some due to deconditioning, weight, and diastolic dysfunction  - refer pulmonary rehabilitation - If no improvement then we'll do cardiopulmonary stress test  #Sleep apnea - follow up with DR Cra[facility]Sood; [facility]  #HIV  - you cannot skip appointment with DR Snider;keep up with that. Glad you started seeing them  #FOllowup  =1st available with Dr Craige CottaSood -= refer to pulmonary rehabilitation - 6 months with Taijuan Serviss  > 50% of this > 25 min visit spent in face to face counseling (15 min visit converted to 25 min)

## 2014-02-08 ENCOUNTER — Telehealth (HOSPITAL_COMMUNITY): Payer: Self-pay | Admitting: *Deleted

## 2014-02-08 NOTE — Telephone Encounter (Signed)
Call was placed to Ms. Ames on 02/07/2014 regarding referral to Pulmonary Rehab.  Telephone message left requesting return call.  Cathie OldenPhyllis Bruno Leach RN

## 2014-02-09 ENCOUNTER — Ambulatory Visit (INDEPENDENT_AMBULATORY_CARE_PROVIDER_SITE_OTHER): Payer: Medicaid Other | Admitting: Pulmonary Disease

## 2014-02-09 ENCOUNTER — Encounter: Payer: Self-pay | Admitting: Pulmonary Disease

## 2014-02-09 VITALS — BP 138/88 | HR 67 | Temp 97.1°F | Ht 62.0 in | Wt 183.0 lb

## 2014-02-09 DIAGNOSIS — I272 Pulmonary hypertension, unspecified: Secondary | ICD-10-CM

## 2014-02-09 DIAGNOSIS — I2789 Other specified pulmonary heart diseases: Secondary | ICD-10-CM

## 2014-02-09 DIAGNOSIS — G4733 Obstructive sleep apnea (adult) (pediatric): Secondary | ICD-10-CM

## 2014-02-09 MED ORDER — FUROSEMIDE 20 MG PO TABS: 20.0000 mg | ORAL_TABLET | Freq: Every day | ORAL | Status: DC

## 2014-02-09 NOTE — Patient Instructions (Signed)
Will arrange for overnight oxygen test Will get report from CPAP machine Follow up in 6 months

## 2014-02-09 NOTE — Progress Notes (Signed)
Chief Complaint  Patient presents with  . Follow-up    Pt c/o chest tightness only at night, c/o orthopnea and dry cough at night x 2 weeks. Pt c/o BIL leg edema. Pt wearig CPAP nightly for 6 hours. Denies issue with machine or pressure.     History of Present Illness: Leslie Gates is a 48 y.o. female with OSA, and OHS.  She uses her CPAP every night.  She has a full face mask, and this fits well.  She uses 2 liters oxygen with CAPAP at night.  She is sleeping about 6 hours per night, and feels rested.   TESTS: PSG 06/08/04 >> RDI 92.1, oxygen nadir 57% ONO on RA 08/03/13 >> 203 min with SpO2 < 88% Echo 08/22/13 >> EF 60 to 65%, PAS 29 mmHg Split night 12/22/13 >> AHI 27, SaO2 low 77%. CPAP 12 cm H2O >> AHI 5.9, +R, +S. Did not need supplemental oxygen.  Leslie Gates  has a past medical history of Pulmonary HTN; OSA (obstructive sleep apnea); Diastolic CHF, chronic; HTN (hypertension); Human immunodeficiency virus (HIV) (2001); Wrist fracture, bilateral; History of herpes zoster; History of cervical dysplasia; Dysphagia; Chronic otitis media; Allergic rhinitis; Obesity; and Coronary artery disease.  Leslie Gates  has past surgical history that includes Tympanostomy tube placement; Cesarean section; Nasal sinus surgery (2004); Wrist fracture surgery (2006); and Adenoidectomy.  Prior to Admission medications   Medication Sig Start Date End Date Taking? Authorizing Provider  amLODipine (NORVASC) 10 MG tablet Take 1 tablet (10 mg total) by mouth daily. 07/26/13  Yes Dolores Pattyaniel R Bensimhon, MD  cetirizine (ZYRTEC) 10 MG tablet Take 1 tablet (10 mg total) by mouth daily. 09/22/12  Yes Judyann Munsonynthia Snider, MD  elvitegravir-cobicistat-emtricitabine-tenofovir (STRIBILD) 150-150-200-300 MG TABS tablet Take 1 tablet by mouth daily with breakfast. 09/26/13  Yes Judyann Munsonynthia Snider, MD  furosemide (LASIX) 20 MG tablet Take 20 mg by mouth daily.    Yes Historical Provider, MD  loratadine (CLARITIN) 10 MG  tablet Take 1 tablet (10 mg total) by mouth daily. 06/30/12  Yes Randall Hissornelius N Van Dam, MD  Multiple Vitamin (MULTIVITAMIN) tablet Take 1 tablet by mouth daily. 03/01/13  Yes Ginnie SmartJeffrey C Hatcher, MD  PROAIR HFA 108 [phone](90 BASE) MCG/ACT inhaler INHALE 1 PUFF INTO THE LUNGS EVERY 6 HOURS AS NEEDED. 10/11/13  Yes Kalman ShanMurali Ramaswamy, MD  spironolactone (ALDACTONE) 25 MG tablet Take 25 mg by mouth daily.    Yes Historical Provider, MD    Allergies  Allergen Reactions  . Lisinopril Swelling    Very swollen bottom lip     Physical Exam:  General - No distress ENT - No sinus tenderness, no oral exudate, MP 4, scalloped tongue, no LAN Cardiac - s1s2 regular, no murmur Chest - No wheeze/rales/dullness Back - No focal tenderness Abd - Soft, non-tender Ext - No edema Neuro - Normal strength Skin - No rashes Psych - normal mood, and behavior   Assessment/Plan:  Coralyn HellingVineet Rakeem Colley, MD [place] [facility]/Critical Care/Sleep Pager:  [phone]

## 2014-02-12 ENCOUNTER — Telehealth (HOSPITAL_COMMUNITY): Payer: Self-pay

## 2014-02-12 NOTE — Telephone Encounter (Signed)
Spoke with patient regarding Pulmonary Rehab referral.  Patient is very interested but Medicaid will not cover any of the program.  Patient states that if her insurance situation changes that she will follow up with us.

## 2014-02-15 NOTE — Assessment & Plan Note (Signed)
She reports compliance with CPAP and benefit from therapy.  Will get copy of her CPAP download and arrange for ONO with CPAP and 2 liters.

## 2014-02-21 ENCOUNTER — Ambulatory Visit: Payer: Medicaid Other | Admitting: Family Medicine

## 2014-02-28 ENCOUNTER — Other Ambulatory Visit: Payer: Self-pay | Admitting: Pulmonary Disease

## 2014-02-28 ENCOUNTER — Other Ambulatory Visit: Payer: Self-pay | Admitting: Internal Medicine

## 2014-03-01 ENCOUNTER — Telehealth: Payer: Self-pay | Admitting: Pulmonary Disease

## 2014-03-01 NOTE — Telephone Encounter (Signed)
lmtcb x1 

## 2014-03-01 NOTE — Telephone Encounter (Signed)
ONO with CPAP and 2 liters 02/13/14 >> Test time 8 hrs 36 min.  Basal SpO2 98%, low SpO2 63% (artifact).  Spent 0.4 min with SpO2 < 88%.  CPAP 02/13/13 to 02/12/14 >> Used on 340 of 365 nights with average 5 hrs 55 min.  Average AHI 3.8 with CPAP 12 cm H2O.  Will have my nurse inform pt that CPAP report and oxygen test look good.  She is to continue current set up of CPAP 12 cm H2O with 2 liters oxygen while asleep.

## 2014-03-05 NOTE — Telephone Encounter (Signed)
lmtcb x2 

## 2014-03-05 NOTE — Telephone Encounter (Signed)
Pt is aware of results. 

## 2014-03-08 ENCOUNTER — Encounter: Payer: Self-pay | Admitting: Internal Medicine

## 2014-03-08 ENCOUNTER — Ambulatory Visit (INDEPENDENT_AMBULATORY_CARE_PROVIDER_SITE_OTHER): Payer: Medicaid Other | Admitting: Internal Medicine

## 2014-03-08 VITALS — BP 131/79 | HR 76 | Temp 97.3°F | Wt 183.0 lb

## 2014-03-08 DIAGNOSIS — L299 Pruritus, unspecified: Secondary | ICD-10-CM

## 2014-03-08 DIAGNOSIS — B20 Human immunodeficiency virus [HIV] disease: Secondary | ICD-10-CM

## 2014-03-08 DIAGNOSIS — N871 Moderate cervical dysplasia: Secondary | ICD-10-CM

## 2014-03-08 NOTE — Progress Notes (Signed)
Subjective:    Patient ID: Leslie Gates, female    DOB: 01/04/1966, 48 y.o.   MRN: [id number]  HPI 48yo F with HIV, CD 4 count of 200/VL<20 on stribild since nov 2014. Doing well with adherence. No longer needs applesauce to take meds, she reports" mind over matter". No problems except for rash at right base of neck.   Current Outpatient Prescriptions on File Prior to Visit  Medication Sig Dispense Refill  . amLODipine (NORVASC) 10 MG tablet Take 1 tablet (10 mg total) by mouth daily.  90 tablet  3  . cetirizine (ZYRTEC) 10 MG tablet Take 1 tablet (10 mg total) by mouth daily.  30 tablet  11  . elvitegravir-cobicistat-emtricitabine-tenofovir (STRIBILD) 150-150-200-300 MG TABS tablet Take 1 tablet by mouth daily with breakfast.  30 tablet  11  . furosemide (LASIX) 20 MG tablet TAKE 1 TABLET BY MOUTH EVERY DAY  30 tablet  2  . loratadine (CLARITIN) 10 MG tablet Take 1 tablet (10 mg total) by mouth daily.  30 tablet  4  . Multiple Vitamin (MULTIVITAMIN) tablet Take 1 tablet by mouth daily.  30 tablet  6  . PROAIR HFA 108 (90 BASE) MCG/ACT inhaler INHALE ONE PUFF INTO THE LUNGS EVERY 6 HOURS AS NEEDED  8.5 each  3  . sulfamethoxazole-trimethoprim (SEPTRA) 400-80 MG per tablet Take 1 tablet by mouth 2 (two) times daily.  60 tablet  5   No current facility-administered medications on file prior to visit.   Active Ambulatory Problems    Diagnosis Date Noted  . HIV DISEASE 06/21/2002  . HERPES ZOSTER 09/15/2007  . Obesity (BMI 30.0-34.9) 07/01/2010  . OBSTRUCTIVE SLEEP APNEA 06/27/2010  . Obesity hypoventilation syndrome 07/10/2010  . OTITIS MEDIA, CHRONIC 03/22/2007  . HYPERTENSION 03/22/2007  . HYPERTENSION, PULMONARY 05/28/2010  . ALLERGIC RHINITIS CAUSE UNSPECIFIED 03/14/2008  . DYSPLASIA OF CERVIX UNSPECIFIED 12/19/2007  . SHORTNESS OF BREATH 05/08/2010  . Rash 08/12/2011  . Angioedema of lips 09/09/2011  . Chemical conjunctivitis 09/22/2012  . Moderate dysplasia of cervix  07/10/2013   Resolved Ambulatory Problems    Diagnosis Date Noted  . Acute bronchitis 04/10/2011   Past Medical History  Diagnosis Date  . Pulmonary HTN   . OSA (obstructive sleep apnea)   . Diastolic CHF, chronic   . HTN (hypertension)   . Human immunodeficiency virus (HIV) 2001  . Wrist fracture, bilateral   . History of herpes zoster   . History of cervical dysplasia   . Dysphagia   . Chronic otitis media   . Allergic rhinitis   . Obesity   . Coronary artery disease        Review of Systems 10 point ros is negative except for rash at base of neck     Objective:   Physical Exam BP 131/79  Pulse 76  Temp(Src) 97.3 F (36.3 C) (Oral)  Wt 183 lb (83.008 kg)  SpO2 96%  LMP 02/12/2014 Physical Exam  Constitutional:  oriented to person, place, and time. appears well-developed and well-nourished. No distress.  HENT:  Mouth/Throat: Oropharynx is clear and moist. No oropharyngeal exudate.  Cardiovascular: Normal rate, regular rhythm and normal heart sounds. Exam reveals no gallop and no friction rub.  No murmur heard.  Pulmonary/Chest: Effort normal and breath sounds normal. No respiratory distress.  has no wheezes.  Abdominal: Soft. Bowel sounds are normal.  exhibits no distension. There is no tenderness.  Lymphadenopathy: no cervical adenopathy.  Neurological: alert and oriented to person,  place, and time.  Skin: Skin is warm and dry. No rash noted. No erythema.  Psychiatric: a normal mood and affect. His behavior is normal.       Assessment & Plan:  hiv = well controlled after 5 months of stribild. Continue with good adherence. Will check labs at next visit  oi proph =bactrim can stop in jun 2015 at next visit  Health maintenance =will check hep b s ab, hep b s ag and hep c at next visit  Moderate dysplasia CIN II of cervix = last biopsy was done in aug 2014. [facility]  Hyperpigmented scaly macularpalar plaque like rash =  will try hydrocortisone 1% bid  rtc in 3 months early july

## 2014-04-04 ENCOUNTER — Encounter: Payer: Self-pay | Admitting: Pulmonary Disease

## 2014-04-12 ENCOUNTER — Other Ambulatory Visit: Payer: Self-pay

## 2014-04-12 DIAGNOSIS — Z1231 Encounter for screening mammogram for malignant neoplasm of breast: Secondary | ICD-10-CM

## 2014-04-24 ENCOUNTER — Telehealth: Payer: Self-pay | Admitting: Internal Medicine

## 2014-04-24 NOTE — Telephone Encounter (Signed)
Spoke with the pt  She is c/o swelling in feet and increased SOB x 2 wks  OV with VS for tomorrow at 4:30

## 2014-04-25 ENCOUNTER — Other Ambulatory Visit (INDEPENDENT_AMBULATORY_CARE_PROVIDER_SITE_OTHER): Payer: Medicaid Other

## 2014-04-25 ENCOUNTER — Ambulatory Visit (INDEPENDENT_AMBULATORY_CARE_PROVIDER_SITE_OTHER): Payer: Medicaid Other | Admitting: Pulmonary Disease

## 2014-04-25 ENCOUNTER — Ambulatory Visit (INDEPENDENT_AMBULATORY_CARE_PROVIDER_SITE_OTHER)
Admission: RE | Admit: 2014-04-25 | Discharge: 2014-04-25 | Disposition: A | Discharge status: Home / Self Care | Payer: Medicaid Other | Source: Ambulatory Visit | Attending: Pulmonary Disease | Admitting: Pulmonary Disease

## 2014-04-25 ENCOUNTER — Encounter: Payer: Self-pay | Admitting: Pulmonary Disease

## 2014-04-25 VITALS — BP 108/80 | HR 73 | Temp 98.7°F | Ht 63.0 in | Wt 192.2 lb

## 2014-04-25 DIAGNOSIS — R0609 Other forms of dyspnea: Secondary | ICD-10-CM

## 2014-04-25 DIAGNOSIS — R0989 Other specified symptoms and signs involving the circulatory and respiratory systems: Secondary | ICD-10-CM

## 2014-04-25 DIAGNOSIS — R0602 Shortness of breath: Secondary | ICD-10-CM

## 2014-04-25 DIAGNOSIS — R0600 Dyspnea, unspecified: Secondary | ICD-10-CM

## 2014-04-25 DIAGNOSIS — G4733 Obstructive sleep apnea (adult) (pediatric): Secondary | ICD-10-CM

## 2014-04-25 LAB — CBC
HCT: 34.9 % — ABNORMAL LOW (ref 36.0–46.0)
HEMOGLOBIN: 11.8 g/dL — AB (ref 12.0–15.0)
MCHC: 33.7 g/dL (ref 30.0–36.0)
MCV: 90 fl (ref 78.0–100.0)
PLATELETS: 312 10*3/uL (ref 150.0–400.0)
RBC: 3.88 Mil/uL (ref 3.87–5.11)
RDW: 13.4 % (ref 11.5–15.5)
WBC: 4.2 10*3/uL (ref 4.0–10.5)

## 2014-04-25 NOTE — Progress Notes (Signed)
Chief Complaint  Patient presents with  . Follow-up    Pt c/o increased leg swelling, pain in legs, SOB x 1 month--worsening. Pt states that when she lays down her breath is taken away. Pt c/o increases numbness and tingling in legs.    History of Present Illness: Leslie Gates is a 48 y.o. female with OSA, and OHS.  She has noticed more trouble with her breathing for the past one month.  She reports using her CPAP and oxygen everynight.  She has been intermittently getting chest pressure and short of breath when she lays flat.  She also has progressive leg swelling b/l.  She has been getting cough with clear to yellow sputum.  She denies fever, or hemoptysis.  She uses albuterol and this helps.  She has also been taking lasix 20 mg daily.  TESTS: PSG 06/08/04 >> RDI 92.1, oxygen nadir 57% ONO on RA 08/03/13 >> 203 min with SpO2 < 88% Echo 08/22/13 >> EF 60 to 65%, PAS 29 mmHg Split night 12/22/13 >> AHI 27, SaO2 low 77%. CPAP 12 cm H2O >> AHI 5.9, +R, +S. Did not need supplemental oxygen. PFT 01/29/14 >> FEV1 1.79 (81%), FEV1% 86, TLC 3.47 (74%), DLCO 71% ONO with CPAP and 2 liters 02/13/14 >> Test time 8 hrs 36 min. Basal SpO2 98%, low SpO2 63% (artifact). Spent 0.4 min with SpO2 < 88%.  CPAP 02/13/13 to 02/12/14 >> Used on 340 of 365 nights with average 5 hrs 55 min. Average AHI 3.8 with CPAP 12 cm H2O.  Leslie Gates  has a past medical history of Pulmonary HTN; OSA (obstructive sleep apnea); Diastolic CHF, chronic; HTN (hypertension); Human immunodeficiency virus (HIV) (2001); Wrist fracture, bilateral; History of herpes zoster; History of cervical dysplasia; Dysphagia; Chronic otitis media; Allergic rhinitis; Obesity; and Coronary artery disease.  Leslie Gates  has past surgical history that includes Tympanostomy tube placement; Cesarean section; Nasal sinus surgery (2004); Wrist fracture surgery (2006); and Adenoidectomy.  Prior to Admission medications   Medication Sig Start  Date End Date Taking? Authorizing Provider  amLODipine (NORVASC) 10 MG tablet Take 1 tablet (10 mg total) by mouth daily. 07/26/13  Yes Dolores Patty, MD  cetirizine (ZYRTEC) 10 MG tablet Take 1 tablet (10 mg total) by mouth daily. 09/22/12  Yes Judyann Munson, MD  elvitegravir-cobicistat-emtricitabine-tenofovir (STRIBILD) 150-150-200-300 MG TABS tablet Take 1 tablet by mouth daily with breakfast. 09/26/13  Yes Judyann Munson, MD  furosemide (LASIX) 20 MG tablet Take 20 mg by mouth daily.    Yes Historical Provider, MD  loratadine (CLARITIN) 10 MG tablet Take 1 tablet (10 mg total) by mouth daily. 06/30/12  Yes Randall Hiss, MD  Multiple Vitamin (MULTIVITAMIN) tablet Take 1 tablet by mouth daily. 03/01/13  Yes Ginnie Smart, MD  PROAIR HFA 108 [phone] BASE) MCG/ACT inhaler INHALE 1 PUFF INTO THE LUNGS EVERY 6 HOURS AS NEEDED. 10/11/13  Yes Kalman Shan, MD  spironolactone (ALDACTONE) 25 MG tablet Take 25 mg by mouth daily.    Yes Historical Provider, MD    Allergies  Allergen Reactions  . Lisinopril Swelling    Very swollen bottom lip     Physical Exam:  General - No distress ENT - No sinus tenderness, no oral exudate, MP 4, scalloped tongue, no LAN Cardiac - s1s2 regular, no murmur Chest - No wheeze/rales/dullness Back - No focal tenderness Abd - Soft, non-tender Ext - 1+ edema Neuro - Normal strength Skin - No rashes Psych -  normal mood, and behavior   Assessment/Plan:  Leslie HellingVineet Syra Sirmons, MD [place] [facility]/Critical Care/Sleep Pager:  [phone]

## 2014-04-25 NOTE — Patient Instructions (Signed)
Take lasix 20 mg pill twice per day for next two days Chest xray and lab tests today Follow up on Friday, June 12 with Tammy Parrett or Dr. Craige Cotta

## 2014-04-26 LAB — COMPREHENSIVE METABOLIC PANEL
ALT: 24 U/L (ref 0–35)
AST: 26 U/L (ref 0–37)
Albumin: 4.1 g/dL (ref 3.5–5.2)
Alkaline Phosphatase: 67 U/L (ref 39–117)
BILIRUBIN TOTAL: 0.8 mg/dL (ref 0.2–1.2)
BUN: 16 mg/dL (ref 6–23)
CO2: 33 meq/L — AB (ref 19–32)
CREATININE: 0.9 mg/dL (ref 0.4–1.2)
Calcium: 9.5 mg/dL (ref 8.4–10.5)
Chloride: 104 mEq/L (ref 96–112)
GFR: 89.83 mL/min (ref >60.00)
GLUCOSE: 87 mg/dL (ref 70–99)
Potassium: 4.2 mEq/L (ref 3.5–5.1)
Sodium: 139 mEq/L (ref 135–145)
Total Protein: 8.6 g/dL — ABNORMAL HIGH (ref 6.0–8.3)

## 2014-04-27 ENCOUNTER — Ambulatory Visit: Payer: Medicaid Other | Admitting: Adult Health

## 2014-04-27 ENCOUNTER — Ambulatory Visit: Payer: Medicaid Other

## 2014-04-30 ENCOUNTER — Other Ambulatory Visit: Payer: Self-pay | Admitting: *Deleted

## 2014-04-30 DIAGNOSIS — B20 Human immunodeficiency virus [HIV] disease: Secondary | ICD-10-CM

## 2014-04-30 MED ORDER — SULFAMETHOXAZOLE-TMP DS 800-160 MG PO TABS: 1.0000 | ORAL_TABLET | Freq: Every day | ORAL | Status: DC

## 2014-05-04 ENCOUNTER — Telehealth: Payer: Self-pay | Admitting: Pulmonary Disease

## 2014-05-04 NOTE — Telephone Encounter (Signed)
ATC x1 Mailbox full unable to leave vmail wcb

## 2014-05-04 NOTE — Telephone Encounter (Signed)
Dg Chest 2 View  04/25/2014   CLINICAL DATA:  dyspnea  EXAM: CHEST  2 VIEW  COMPARISON:  Two view chest dated 09/11/2013  FINDINGS: Low lung volumes. The heart size and mediastinal contours are within normal limits. Both lungs are clear. The visualized skeletal structures are unremarkable.  IMPRESSION: No active cardiopulmonary disease.   Electronically Signed   By: Salome HolmesHector  Cooper M.D.   On: 04/25/2014 17:35   CBC    Component Value Date/Time   WBC 4.2 04/25/2014 1711   RBC 3.88 04/25/2014 1711   HGB 11.8* 04/25/2014 1711   HCT 34.9* 04/25/2014 1711   PLT 312.0 04/25/2014 1711   MCV 90.0 04/25/2014 1711   MCHC 33.7 04/25/2014 1711   RDW 13.4 04/25/2014 1711    CMP     Component Value Date/Time   NA 139 04/25/2014 1711   K 4.2 04/25/2014 1711   CL 104 04/25/2014 1711   CO2 33* 04/25/2014 1711   GLUCOSE 87 04/25/2014 1711   BUN 16 04/25/2014 1711   CREATININE 0.9 04/25/2014 1711   CALCIUM 9.5 04/25/2014 1711   PROT 8.6* 04/25/2014 1711   ALBUMIN 4.1 04/25/2014 1711   AST 26 04/25/2014 1711   ALT 24 04/25/2014 1711   ALKPHOS 67 04/25/2014 1711   BILITOT 0.8 04/25/2014 1711    Will have my nurse inform pt that CXR and labs were okay.  No change to current treatment plan.

## 2014-05-04 NOTE — Assessment & Plan Note (Signed)
Advised her to increase her lasix.  Will check her labs and CXR.

## 2014-05-04 NOTE — Assessment & Plan Note (Signed)
Recent CPAP report and ONO show good control with CPAP 12 cm H2O and 2 liters oxygen at night.

## 2014-05-07 ENCOUNTER — Ambulatory Visit: Payer: Medicaid Other

## 2014-05-07 NOTE — Telephone Encounter (Signed)
LMOM x 1 

## 2014-05-10 NOTE — Telephone Encounter (Signed)
Pt advised. Jennifer Castillo, CMA  

## 2014-05-10 NOTE — Telephone Encounter (Signed)
LMOM x 2 

## 2014-05-14 ENCOUNTER — Ambulatory Visit (INDEPENDENT_AMBULATORY_CARE_PROVIDER_SITE_OTHER): Payer: Medicaid Other | Admitting: Obstetrics

## 2014-05-14 ENCOUNTER — Encounter: Payer: Self-pay | Admitting: Obstetrics

## 2014-05-14 VITALS — BP 119/74 | HR 69 | Temp 98.4°F | Ht 62.0 in | Wt 196.0 lb

## 2014-05-14 DIAGNOSIS — Z1239 Encounter for other screening for malignant neoplasm of breast: Secondary | ICD-10-CM

## 2014-05-14 DIAGNOSIS — Z0000 Encounter for general adult medical examination without abnormal findings: Secondary | ICD-10-CM

## 2014-05-14 DIAGNOSIS — N871 Moderate cervical dysplasia: Secondary | ICD-10-CM

## 2014-05-14 NOTE — Progress Notes (Signed)
Subjective:     Leslie Gates is a 48 y.o. female here for a routine exam.  Current complaints: None.    Personal health questionnaire:  Is patient Leslie Gates, have a family history of breast and/or ovarian cancer: no Is there a family history of uterine cancer diagnosed at age < 1650, gastrointestinal cancer, urinary tract cancer, family member who is a Personnel officerLynch syndrome-associated carrier: no Is the patient overweight and hypertensive, family history of diabetes, personal history of gestational diabetes or PCOS: no Is patient over 9255, have PCOS,  family history of premature CHD under age 47, diabetes, smoke, have hypertension or peripheral artery disease:  no  The HPI was reviewed and explored in further detail by the provider. Gynecologic History Patient's last menstrual period was 04/22/2014. Contraception: none Last Pap: August / 2014. 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 Results were: normal  Obstetric History OB History  No data available    Past Medical History  Diagnosis Date  . Pulmonary HTN   . OSA (obstructive sleep apnea)   . Diastolic CHF, chronic   . HTN (hypertension)   . Human immunodeficiency virus (HIV) 2001  . Wrist fracture, bilateral   . History of herpes zoster   . History of cervical dysplasia   . Dysphagia   . Chronic otitis media   . Allergic rhinitis   . Obesity   . Coronary artery disease     Past Surgical History  Procedure Laterality Date  . Tympanostomy tube placement      as a child  . Cesarean section    . Nasal sinus surgery  2004    [facility]  . Wrist fracture surgery  2006    bilateral - [facility]  . Adenoidectomy      Current outpatient prescriptions:amLODipine (NORVASC) 10 MG tablet, Take 1 tablet (10 mg total) by mouth daily., Disp: 90 tablet, Rfl: 3;  cetirizine (ZYRTEC) 10 MG tablet, Take 1 tablet (10 mg total) by mouth daily., Disp: 30 tablet, Rfl: 11;  elvitegravir-cobicistat-emtricitabine-tenofovir  (STRIBILD) 150-150-200-300 MG TABS tablet, Take 1 tablet by mouth daily with breakfast., Disp: 30 tablet, Rfl: 11 furosemide (LASIX) 20 MG tablet, TAKE 1 TABLET BY MOUTH EVERY DAY, Disp: 30 tablet, Rfl: 2;  loratadine (CLARITIN) 10 MG tablet, Take 1 tablet (10 mg total) by mouth daily., Disp: 30 tablet, Rfl: 4;  Multiple Vitamin (MULTIVITAMIN) tablet, Take 1 tablet by mouth daily., Disp: 30 tablet, Rfl: 6;  PROAIR HFA 108 (90 BASE) MCG/ACT inhaler, INHALE ONE PUFF INTO THE LUNGS EVERY 6 HOURS AS NEEDED, Disp: 8.5 each, Rfl: 3 sulfamethoxazole-trimethoprim (BACTRIM DS) 800-160 MG per tablet, Take 1 tablet by mouth daily., Disp: 30 tablet, Rfl: 1 Allergies  Allergen Reactions  . Lisinopril Swelling    Very swollen bottom lip    History  Substance Use Topics  . Smoking status: Never Smoker   . Smokeless tobacco: Never Used  . Alcohol Use: No    Family History  Problem Relation Age of Onset  . Other Mother 10252    ESRD  . Hyperlipidemia Mother   . Hypertension Mother   . Alcohol abuse Mother   . Other Father     GSW  . Hypertension Father   . Kidney disease Father     was on dialysis prior to death  . Alcohol abuse Father     liver failure from alcohol use  . Hypertension    . Hypertension Sister   . Alcohol abuse  Sister   . Hypertension Brother   . Alcohol abuse Brother       Review of Systems  Constitutional: negative for fatigue and weight loss Respiratory: negative for cough and wheezing Cardiovascular: negative for chest pain, fatigue and palpitations Gastrointestinal: negative for abdominal pain and change in bowel habits Musculoskeletal:negative for myalgias Neurological: negative for gait problems and tremors Behavioral/Psych: negative for abusive relationship, depression Endocrine: negative for temperature intolerance   Genitourinary:negative for abnormal menstrual periods, genital lesions, hot flashes, sexual problems and vaginal discharge Integument/breast:  negative for breast lump, breast tenderness, nipple discharge and skin lesion(s)    Objective:       BP 119/74  Pulse 69  Temp(Src) 98.4 F (36.9 C)  Ht 5\' 2"  (1.575 m)  Wt 196 lb (88.905 kg)  BMI 35.84 kg/m2  LMP 04/22/2014 General:   alert  Skin:   no rash or abnormalities  Lungs:   clear to auscultation bilaterally  Heart:   regular rate and rhythm, S1, S2 normal, no murmur, click, rub or gallop  Breasts:   normal without suspicious masses, skin or nipple changes or axillary nodes  Abdomen:  normal findings: no organomegaly, soft, non-tender and no hernia  Pelvis:  External genitalia: normal general appearance Urinary system: urethral meatus normal and bladder without fullness, nontender Vaginal: normal without tenderness, induration or masses Cervix: normal appearance Adnexa: normal bimanual exam Uterus: anteverted and non-tender, normal size   Lab Review Urine pregnancy test Labs reviewed yes Radiologic studies reviewed yes    Assessment:    Healthy female exam.   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     Plan:     Education reviewed: calcium supplements, low fat, low cholesterol diet, safe sex/STD prevention, self breast exams and weight bearing exercise. Contraception: none. Mammogram ordered. Follow up in: 1 year.   No orders of the defined types were placed in this encounter.   Orders Placed This Encounter  Procedures  . MM Digital Screening    PF 08/09/2012 [facility] NO NEEDS NP/BARB MCD 2D     Standing Status: Future     Number of Occurrences:      Standing Expiration Date: 07/15/2015    Order Specific Question:  Reason for Exam (SYMPTOM  OR DIAGNOSIS REQUIRED)    Answer:  ANNUAL    Order Specific Question:  Is the patient pregnant?    Answer:  No    Order Specific Question:  Preferred imaging location?    Answer:  [facility]   Need to obtain previous records Possible management options include:  Cryocautery of cervix for CIN  2 Follow up as needed.

## 2014-05-14 NOTE — Addendum Note (Signed)
Addended by: Odessa FlemingBOHNE, KRISTINA M on: 05/14/2014 05:01 PM   Modules accepted: Orders

## 2014-05-15 LAB — GC/CHLAMYDIA PROBE AMP
CT Probe RNA: NEGATIVE
GC Probe RNA: NEGATIVE

## 2014-05-15 LAB — WET PREP BY MOLECULAR PROBE
CANDIDA SPECIES: NEGATIVE
Gardnerella vaginalis: NEGATIVE
Trichomonas vaginosis: NEGATIVE

## 2014-05-16 LAB — PAP IG AND HPV HIGH-RISK: HPV DNA High Risk: DETECTED — AB

## 2014-05-21 ENCOUNTER — Other Ambulatory Visit: Payer: Medicaid Other

## 2014-05-21 ENCOUNTER — Ambulatory Visit
Admission: RE | Admit: 2014-05-21 | Discharge: 2014-05-21 | Disposition: A | Discharge status: Home / Self Care | Payer: Medicaid Other | Source: Ambulatory Visit | Attending: Obstetrics | Admitting: Obstetrics

## 2014-05-21 DIAGNOSIS — Z1239 Encounter for other screening for malignant neoplasm of breast: Secondary | ICD-10-CM

## 2014-05-28 ENCOUNTER — Other Ambulatory Visit: Payer: Medicaid Other

## 2014-05-28 DIAGNOSIS — B20 Human immunodeficiency virus [HIV] disease: Secondary | ICD-10-CM

## 2014-05-29 ENCOUNTER — Other Ambulatory Visit: Payer: Self-pay | Admitting: Pulmonary Disease

## 2014-05-30 LAB — HIV-1 RNA QUANT-NO REFLEX-BLD
HIV 1 RNA Quant: <20 copies/mL (ref <20)
HIV-1 RNA Quant, Log: <1.3 {Log} (ref <1.30)

## 2014-05-30 LAB — T-HELPER CELL (CD4) - (RCID CLINIC ONLY)
CD4 T CELL ABS: 410 /uL (ref 400–2700)
CD4 T CELL HELPER: 21 % — AB (ref 33–55)

## 2014-06-07 ENCOUNTER — Encounter: Payer: Self-pay | Admitting: Internal Medicine

## 2014-06-07 ENCOUNTER — Ambulatory Visit (INDEPENDENT_AMBULATORY_CARE_PROVIDER_SITE_OTHER): Payer: Medicaid Other | Admitting: Internal Medicine

## 2014-06-07 VITALS — BP 101/65 | HR 78 | Temp 97.5°F | Wt 191.0 lb

## 2014-06-07 DIAGNOSIS — I5032 Chronic diastolic (congestive) heart failure: Secondary | ICD-10-CM

## 2014-06-07 NOTE — Progress Notes (Signed)
Subjective:    Patient ID: Leslie Gates, female    DOB: [birth year], 48 y.o.   MRN: [id number]  HPI 48yo F with HIV-well controlled, CD 4 count of 410/VL<20 on stribild, pHTN, OSA, OHS, wears cpap setting of 12 and 2LNC in the evening. She was last seen in pulmonary clinic in June where they increased her lasix temporarily. Patient used to take lasix in the evening but now has more improvement with symptoms of lower extremity swelling when she takes it in the daytime. She reports sleeping with HOB elevated since she feels suffocated with CPAP if laying flat. No recent illness. No f/c/nightsweats.  Lab Results  Component Value Date   CD4TCELL 21* 05/28/2014   CD4TABS 410 05/28/2014   Lab Results  Component Value Date   HIV1RNAQUANT <20 05/28/2014    Current Outpatient Prescriptions on File Prior to Visit  Medication Sig Dispense Refill  . amLODipine (NORVASC) 10 MG tablet Take 1 tablet (10 mg total) by mouth daily.  90 tablet  3  . cetirizine (ZYRTEC) 10 MG tablet Take 1 tablet (10 mg total) by mouth daily.  30 tablet  11  . elvitegravir-cobicistat-emtricitabine-tenofovir (STRIBILD) 150-150-200-300 MG TABS tablet Take 1 tablet by mouth daily with breakfast.  30 tablet  11  . furosemide (LASIX) 20 MG tablet TAKE 1 TABLET BY MOUTH EVERY DAY  30 tablet  1  . loratadine (CLARITIN) 10 MG tablet Take 1 tablet (10 mg total) by mouth daily.  30 tablet  4  . Multiple Vitamin (MULTIVITAMIN) tablet Take 1 tablet by mouth daily.  30 tablet  6  . PROAIR HFA 108 (90 BASE) MCG/ACT inhaler INHALE ONE PUFF INTO THE LUNGS EVERY 6 HOURS AS NEEDED  8.5 each  3  . sulfamethoxazole-trimethoprim (BACTRIM DS) 800-160 MG per tablet Take 1 tablet by mouth daily.  30 tablet  1   No current facility-administered medications on file prior to visit.   Active Ambulatory Problems    Diagnosis Date Noted  . HIV DISEASE 06/21/2002  . HERPES ZOSTER 09/15/2007  . Obesity (BMI 30.0-34.9) 07/01/2010  . OBSTRUCTIVE  SLEEP APNEA 06/27/2010  . Obesity hypoventilation syndrome 07/10/2010  . OTITIS MEDIA, CHRONIC 03/22/2007  . HYPERTENSION 03/22/2007  . HYPERTENSION, PULMONARY 05/28/2010  . ALLERGIC RHINITIS CAUSE UNSPECIFIED 03/14/2008  . DYSPLASIA OF CERVIX UNSPECIFIED 12/19/2007  . SHORTNESS OF BREATH 05/08/2010  . Rash 08/12/2011  . Angioedema of lips 09/09/2011  . Chemical conjunctivitis 09/22/2012  . Moderate dysplasia of cervix 07/10/2013   Resolved Ambulatory Problems    Diagnosis Date Noted  . Acute bronchitis 04/10/2011   Past Medical History  Diagnosis Date  . Pulmonary HTN   . OSA (obstructive sleep apnea)   . Diastolic CHF, chronic   . HTN (hypertension)   . Human immunodeficiency virus (HIV) 2001  . Wrist fracture, bilateral   . History of herpes zoster   . History of cervical dysplasia   . Dysphagia   . Chronic otitis media   . Allergic rhinitis   . Obesity   . Coronary artery disease     History  Substance Use Topics  . Smoking status: Never Smoker   . Smokeless tobacco: Never Used  . Alcohol Use: No  family history includes Alcohol abuse in her brother, father, mother, and sister; Hyperlipidemia in her mother; Hypertension in her brother, father, mother, sister, and another family member; Kidney disease in her father; Other in her father; Other (age of onset: 7152) in her mother.  Review of Systems  Constitutional: Negative for fever, chills, diaphoresis, activity change, appetite change, fatigue and unexpected weight change.  HENT: Negative for congestion, sore throat, rhinorrhea, sneezing, trouble swallowing and sinus pressure.  Eyes: Negative for photophobia and visual disturbance.  Respiratory: + shortness of breath. Negative for cough, chest tightness, , wheezing and stridor.  Cardiovascular: Negative for chest pain, palpitations and leg swelling.  Gastrointestinal: Negative for nausea, vomiting, abdominal pain, diarrhea, constipation, blood in stool, abdominal  distention and anal bleeding.  Genitourinary: Negative for dysuria, hematuria, flank pain and difficulty urinating.  Musculoskeletal: Negative for myalgias, back pain, joint swelling, arthralgias and gait problem.  Skin: Negative for color change, pallor, rash and wound.  Neurological: Negative for dizziness, tremors, weakness and light-headedness.  Hematological: Negative for adenopathy. Does not bruise/bleed easily.  Psychiatric/Behavioral: Negative for behavioral problems, confusion, sleep disturbance, dysphoric mood, decreased concentration and agitation.       Objective:   Physical Exam BP 101/65  Pulse 78  Temp(Src) 97.5 F (36.4 C) (Oral)  Wt 191 lb (86.637 kg)  SpO2 97%  LMP 05/20/2014 Physical Exam  Constitutional:  oriented to person, place, and time. appears well-developed and well-nourished. No distress.  HENT:  Mouth/Throat: Oropharynx is clear and moist. No oropharyngeal exudate.  Cardiovascular: Normal rate, regular rhythm and normal heart sounds. Exam reveals no gallop and no friction rub.  No murmur heard.  Pulmonary/Chest: Effort normal and breath sounds normal. No respiratory distress.  has no wheezes.  Abdominal: Soft. Bowel sounds are normal.  exhibits no distension. There is no tenderness.  Lymphadenopathy: no cervical adenopathy.  Ext: + 1 edema lower extremities bilaterally Neurological: alert and oriented to person, place, and time.  Skin: Skin is warm and dry. No rash noted. No erythema.  Psychiatric: a normal mood and affect. behavior is normal.   Labs: BMET    Component Value Date/Time   NA 139 04/25/2014 1711   K 4.2 04/25/2014 1711   CL 104 04/25/2014 1711   CO2 33* 04/25/2014 1711   GLUCOSE 87 04/25/2014 1711   BUN 16 04/25/2014 1711   CREATININE 0.9 04/25/2014 1711   CREATININE 0.73 01/29/2014 1129   CALCIUM 9.5 04/25/2014 1711   GFRNONAA >89 05/23/2013 1040   GFRNONAA >90 02/19/2012 1541   GFRAA >89 05/23/2013 1040   GFRAA >90 02/19/2012 1541         Assessment & Plan:  hiv = well controlled, continue with stribild  Diastolic chf = will check tte,unclear with her symptoms of DOE, and orthopnea are related to heart failure. Asked her to increase dose by one tab of lasix if she noted increasing in leg swelling or if she gained 2# with daily weight checks  Obesity = will establish appt with nutritionist at next appt  Health maintenance = to receive flu vax at next visit

## 2014-06-28 ENCOUNTER — Other Ambulatory Visit: Payer: Self-pay | Admitting: Licensed Clinical Social Worker

## 2014-06-28 ENCOUNTER — Other Ambulatory Visit: Payer: Self-pay | Admitting: Internal Medicine

## 2014-07-10 ENCOUNTER — Other Ambulatory Visit (HOSPITAL_COMMUNITY): Payer: Self-pay | Admitting: Anesthesiology

## 2014-07-10 DIAGNOSIS — I2721 Secondary pulmonary arterial hypertension: Secondary | ICD-10-CM

## 2014-07-27 ENCOUNTER — Other Ambulatory Visit: Payer: Self-pay | Admitting: Pulmonary Disease

## 2014-07-27 ENCOUNTER — Other Ambulatory Visit: Payer: Self-pay | Admitting: *Deleted

## 2014-07-27 MED ORDER — ONE-DAILY MULTI VITAMINS PO TABS: 1.0000 | ORAL_TABLET | Freq: Every day | ORAL | Status: DC

## 2014-08-27 ENCOUNTER — Other Ambulatory Visit: Payer: Self-pay | Admitting: Pulmonary Disease

## 2014-08-30 ENCOUNTER — Ambulatory Visit: Payer: Medicaid Other | Admitting: Pulmonary Disease

## 2014-09-04 ENCOUNTER — Other Ambulatory Visit: Payer: Self-pay

## 2014-09-04 MED ORDER — AMLODIPINE BESYLATE 10 MG PO TABS: 10.0000 mg | ORAL_TABLET | Freq: Every day | ORAL | Status: DC

## 2014-09-05 ENCOUNTER — Other Ambulatory Visit: Payer: Medicaid Other

## 2014-09-05 DIAGNOSIS — B20 Human immunodeficiency virus [HIV] disease: Secondary | ICD-10-CM

## 2014-09-05 DIAGNOSIS — Z113 Encounter for screening for infections with a predominantly sexual mode of transmission: Secondary | ICD-10-CM

## 2014-09-05 LAB — CBC WITH DIFFERENTIAL/PLATELET
Basophils Absolute: 0 10*3/uL (ref 0.0–0.1)
Basophils Relative: 1 % (ref 0–1)
Eosinophils Absolute: 0.1 10*3/uL (ref 0.0–0.7)
Eosinophils Relative: 4 % (ref 0–5)
HCT: 38.2 % (ref 36.0–46.0)
HEMOGLOBIN: 13 g/dL (ref 12.0–15.0)
LYMPHS ABS: 1.9 10*3/uL (ref 0.7–4.0)
LYMPHS PCT: 51 % — AB (ref 12–46)
MCH: 30 pg (ref 26.0–34.0)
MCHC: 34 g/dL (ref 30.0–36.0)
MCV: 88 fL (ref 78.0–100.0)
MONOS PCT: 11 % (ref 3–12)
Monocytes Absolute: 0.4 10*3/uL (ref 0.1–1.0)
NEUTROS ABS: 1.2 10*3/uL — AB (ref 1.7–7.7)
NEUTROS PCT: 33 % — AB (ref 43–77)
Platelets: 331 10*3/uL (ref 150–400)
RBC: 4.34 MIL/uL (ref 3.87–5.11)
RDW: 13.9 % (ref 11.5–15.5)
WBC: 3.7 10*3/uL — AB (ref 4.0–10.5)

## 2014-09-06 LAB — HIV-1 RNA QUANT-NO REFLEX-BLD
HIV 1 RNA Quant: <20 copies/mL (ref <20)
HIV-1 RNA Quant, Log: <1.3 {Log} (ref <1.30)

## 2014-09-06 LAB — COMPLETE METABOLIC PANEL WITH GFR
ALBUMIN: 4.4 g/dL (ref 3.5–5.2)
ALT: 16 U/L (ref 0–35)
AST: 19 U/L (ref 0–37)
Alkaline Phosphatase: 77 U/L (ref 39–117)
BUN: 13 mg/dL (ref 6–23)
CHLORIDE: 101 meq/L (ref 96–112)
CO2: 26 meq/L (ref 19–32)
Calcium: 9.7 mg/dL (ref 8.4–10.5)
Creat: 0.76 mg/dL (ref 0.50–1.10)
GLUCOSE: 83 mg/dL (ref 70–99)
Potassium: 4.2 mEq/L (ref 3.5–5.3)
SODIUM: 135 meq/L (ref 135–145)
TOTAL PROTEIN: 8.6 g/dL — AB (ref 6.0–8.3)
Total Bilirubin: 0.3 mg/dL (ref 0.2–1.2)

## 2014-09-06 LAB — T-HELPER CELL (CD4) - (RCID CLINIC ONLY)
CD4 % Helper T Cell: 20 % — ABNORMAL LOW (ref 33–55)
CD4 T Cell Abs: 410 /uL (ref 400–2700)

## 2014-09-25 ENCOUNTER — Ambulatory Visit (INDEPENDENT_AMBULATORY_CARE_PROVIDER_SITE_OTHER): Payer: Medicaid Other | Admitting: Internal Medicine

## 2014-09-25 ENCOUNTER — Encounter: Payer: Self-pay | Admitting: Internal Medicine

## 2014-09-25 VITALS — BP 128/84 | HR 77 | Temp 97.2°F | Wt 200.0 lb

## 2014-09-25 DIAGNOSIS — Z23 Encounter for immunization: Secondary | ICD-10-CM

## 2014-09-25 DIAGNOSIS — Z21 Asymptomatic human immunodeficiency virus [HIV] infection status: Secondary | ICD-10-CM

## 2014-09-25 NOTE — Progress Notes (Signed)
Patient ID: Leslie Gates, female   DOB: 12-Nov-1966, 48 y.o.   MRN: [id number]       Patient ID: Leslie Gates, female   DOB: 12-Nov-1966, 48 y.o.   MRN: [id number]  HPI 48yo F with HIV disease, well controlled, CD 4 count of 410/VL<20 on stribild. Good adherence on stribild. She has been in good state of health except for weight gain. She denies any recent illnesses  Outpatient Encounter Prescriptions as of 09/25/2014  Medication Sig  . amLODipine (NORVASC) 10 MG tablet Take 1 tablet (10 mg total) by mouth daily.  . cetirizine (ZYRTEC) 10 MG tablet Take 1 tablet (10 mg total) by mouth daily.  Marland Kitchen. elvitegravir-cobicistat-emtricitabine-tenofovir (STRIBILD) 150-150-200-300 MG TABS tablet Take 1 tablet by mouth daily with breakfast.  . furosemide (LASIX) 20 MG tablet TAKE 1 TABLET BY MOUTH EVERY DAY  . loratadine (CLARITIN) 10 MG tablet Take 1 tablet (10 mg total) by mouth daily.  . Multiple Vitamin (MULTIVITAMIN) tablet Take 1 tablet by mouth daily.  Marland Kitchen. PROAIR HFA 108 (90 BASE) MCG/ACT inhaler INHALE ONE PUFF INTO THE LUNGS EVERY 6 HOURS AS NEEDED     Patient Active Problem List   Diagnosis Date Noted  . Moderate dysplasia of cervix 07/10/2013  . Chemical conjunctivitis 09/22/2012  . Angioedema of lips 09/09/2011  . Rash 08/12/2011  . Obesity hypoventilation syndrome 07/10/2010  . Obesity (BMI 30.0-34.9) 07/01/2010  . OBSTRUCTIVE SLEEP APNEA 06/27/2010  . HYPERTENSION, PULMONARY 05/28/2010  . SHORTNESS OF BREATH 05/08/2010  . ALLERGIC RHINITIS CAUSE UNSPECIFIED 03/14/2008  . DYSPLASIA OF CERVIX UNSPECIFIED 12/19/2007  . HERPES ZOSTER 09/15/2007  . OTITIS MEDIA, CHRONIC 03/22/2007  . HYPERTENSION 03/22/2007  . HIV DISEASE 06/21/2002     Health Maintenance Due  Topic Date Due  . TETANUS/TDAP  07/05/1986  . INFLUENZA VACCINE  06/16/2014     Review of Systems   Constitutional: Negative for fever, chills, diaphoresis, activity change, appetite change, fatigue and unexpected  weight change.  HENT: Negative for congestion, sore throat, rhinorrhea, sneezing, trouble swallowing and sinus pressure.  Eyes: Negative for photophobia and visual disturbance.  Respiratory: Negative for cough, chest tightness, shortness of breath, wheezing and stridor.  Cardiovascular: Negative for chest pain, palpitations and leg swelling.  Gastrointestinal: Negative for nausea, vomiting, abdominal pain, diarrhea, constipation, blood in stool, abdominal distention and anal bleeding.  Genitourinary: Negative for dysuria, hematuria, flank pain and difficulty urinating.  Musculoskeletal: Negative for myalgias, back pain, joint swelling, arthralgias and gait problem.  Skin: Negative for color change, pallor, rash and wound.  Neurological: Negative for dizziness, tremors, weakness and light-headedness.  Hematological: Negative for adenopathy. Does not bruise/bleed easily.  Psychiatric/Behavioral: Negative for behavioral problems, confusion, sleep disturbance, dysphoric mood, decreased concentration and agitation.    Physical Exam   BP 128/84 mmHg  Pulse 77  Temp(Src) 97.2 F (36.2 C) (Oral)  Wt 200 lb (90.719 kg)  LMP 09/18/2014 (Approximate) Physical Exam  Constitutional: He is oriented to person, place, and time. He appears well-developed and well-nourished. No distress.  HENT:  Mouth/Throat: Oropharynx is clear and moist. No oropharyngeal exudate.  Cardiovascular: Normal rate, regular rhythm and normal heart sounds. Exam reveals no gallop and no friction rub.  No murmur heard.  Pulmonary/Chest: Effort normal and breath sounds normal. No respiratory distress. He has no wheezes.  Abdominal: Soft. Bowel sounds are normal. He exhibits no distension. There is no tenderness.  Lymphadenopathy:  He has no cervical adenopathy.  Neurological: He is alert and oriented to person,  place, and time.  Skin: Skin is warm and dry. No rash noted. No erythema.  Psychiatric: He has a normal mood and  affect. His behavior is normal.    Lab Results  Component Value Date   CD4TCELL 20* 09/05/2014   Lab Results  Component Value Date   CD4TABS 410 09/05/2014   CD4TABS 410 05/28/2014   CD4TABS 200* 01/29/2014   Lab Results  Component Value Date   HIV1RNAQUANT <20 09/05/2014   Lab Results  Component Value Date   HEPBSAB NEG 02/08/2009   No results found for: RPR  CBC Lab Results  Component Value Date   WBC 3.7* 09/05/2014   RBC 4.34 09/05/2014   HGB 13.0 09/05/2014   HCT 38.2 09/05/2014   PLT 331 09/05/2014   MCV 88.0 09/05/2014   MCH 30.0 09/05/2014   MCHC 34.0 09/05/2014   RDW 13.9 09/05/2014   LYMPHSABS 1.9 09/05/2014   MONOABS 0.4 09/05/2014   EOSABS 0.1 09/05/2014   BASOSABS 0.0 09/05/2014   BMET Lab Results  Component Value Date   NA 135 09/05/2014   K 4.2 09/05/2014   CL 101 09/05/2014   CO2 26 09/05/2014   GLUCOSE 83 09/05/2014   BUN 13 09/05/2014   CREATININE 0.76 09/05/2014   CALCIUM 9.7 09/05/2014   GFRNONAA >89 09/05/2014   GFRAA >89 09/05/2014     Assessment and Plan  hiv = well controlled. Continue with stribild  Health maintenance = will give flu vaccine today. Get mammo in the next 6 months. Will check lipids  and rpr at next visit  CIN II hx = needs pap in 6 months

## 2014-10-05 ENCOUNTER — Other Ambulatory Visit: Payer: Self-pay | Admitting: Internal Medicine

## 2014-10-23 ENCOUNTER — Ambulatory Visit (INDEPENDENT_AMBULATORY_CARE_PROVIDER_SITE_OTHER): Payer: Medicaid Other | Admitting: Family Medicine

## 2014-10-23 ENCOUNTER — Encounter: Payer: Self-pay | Admitting: Family Medicine

## 2014-10-23 ENCOUNTER — Ambulatory Visit
Admission: RE | Admit: 2014-10-23 | Discharge: 2014-10-23 | Disposition: A | Discharge status: Home / Self Care | Payer: Medicaid Other | Source: Ambulatory Visit | Attending: Family Medicine | Admitting: Family Medicine

## 2014-10-23 VITALS — BP 121/83 | HR 84 | Temp 98.4°F | Ht 62.0 in | Wt 204.2 lb

## 2014-10-23 DIAGNOSIS — M25569 Pain in unspecified knee: Secondary | ICD-10-CM | POA: Insufficient documentation

## 2014-10-23 DIAGNOSIS — M25562 Pain in left knee: Secondary | ICD-10-CM

## 2014-10-23 MED ORDER — TRAMADOL HCL 50 MG PO TABS: 50.0000 mg | ORAL_TABLET | Freq: Four times a day (QID) | ORAL | Status: DC | PRN

## 2014-10-23 NOTE — Assessment & Plan Note (Signed)
acut eknee pain after a fall L knee with locking and popping symptoms as well as medial joint line tenderness, likely meniscal tear Offered steroid injection today, patient declines Initially considered starting scheduled NSAIDs for 1 week but with tenofovir there is an increased risk of renal toxicity, after discussion with the pharmacist I will use tramadol instead Bilateral knee x-rays to rule out bony injury, however unlikely Also encouraged ice, heat, compression. Follow-up one to 2 weeks
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
## 2014-10-23 NOTE — Patient Instructions (Signed)
Great to meet you today!  Lets get you to follow up soon in 1-2 weeks to make sure you are recovering well and there is no need for more imaging  The primary side effect of tramadol is sedation, try the first one at home.

## 2014-10-24 ENCOUNTER — Telehealth: Payer: Self-pay | Admitting: Family Medicine

## 2014-10-24 NOTE — Telephone Encounter (Signed)
Called to explalin to no acute findings on her recent films, N significant OA eaither.   Continue cuurrent Tx.   Murtis SinkSam Dalisha Shively, MD [facility] Resident, PGY-3 10/24/2014, 4:48 PM

## 2014-10-25 ENCOUNTER — Encounter (HOSPITAL_COMMUNITY): Payer: Self-pay | Admitting: Internal Medicine

## 2014-10-26 ENCOUNTER — Encounter: Payer: Self-pay | Admitting: Pulmonary Disease

## 2014-10-26 ENCOUNTER — Ambulatory Visit (INDEPENDENT_AMBULATORY_CARE_PROVIDER_SITE_OTHER): Payer: Medicaid Other | Admitting: Pulmonary Disease

## 2014-10-26 VITALS — BP 126/88 | HR 78 | Ht 62.0 in | Wt 201.0 lb

## 2014-10-26 DIAGNOSIS — G4733 Obstructive sleep apnea (adult) (pediatric): Secondary | ICD-10-CM

## 2014-10-26 DIAGNOSIS — E662 Morbid (severe) obesity with alveolar hypoventilation: Secondary | ICD-10-CM
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
## 2014-10-26 NOTE — Patient Instructions (Signed)
Follow-up in one year.

## 2014-10-30 ENCOUNTER — Other Ambulatory Visit: Payer: Self-pay | Admitting: *Deleted

## 2014-10-30 DIAGNOSIS — L299 Pruritus, unspecified: Secondary | ICD-10-CM

## 2014-11-01 ENCOUNTER — Other Ambulatory Visit: Payer: Self-pay | Admitting: Licensed Clinical Social Worker

## 2014-11-01 DIAGNOSIS — B20 Human immunodeficiency virus [HIV] disease: Secondary | ICD-10-CM

## 2014-11-01 MED ORDER — ELVITEG-COBIC-EMTRICIT-TENOFDF 150-150-200-300 MG PO TABS: 1.0000 | ORAL_TABLET | Freq: Every day | ORAL | Status: DC

## 2014-11-02 MED ORDER — ONE-DAILY MULTI VITAMINS PO TABS: 1.0000 | ORAL_TABLET | Freq: Every day | ORAL | Status: DC

## 2014-11-02 MED ORDER — CETIRIZINE HCL 10 MG PO TABS: 10.0000 mg | ORAL_TABLET | Freq: Every day | ORAL | Status: DC

## 2014-11-14 ENCOUNTER — Ambulatory Visit: Payer: Medicaid Other | Admitting: Family Medicine

## 2014-11-19 ENCOUNTER — Ambulatory Visit: Payer: Self-pay | Admitting: Obstetrics

## 2014-11-26 ENCOUNTER — Ambulatory Visit: Payer: Self-pay | Admitting: Obstetrics

## 2015-01-23 ENCOUNTER — Telehealth: Payer: Self-pay | Admitting: Family Medicine

## 2015-01-23 ENCOUNTER — Other Ambulatory Visit: Payer: Self-pay | Admitting: *Deleted

## 2015-01-23 ENCOUNTER — Other Ambulatory Visit: Payer: Self-pay | Admitting: Internal Medicine

## 2015-01-23 DIAGNOSIS — L299 Pruritus, unspecified: Secondary | ICD-10-CM

## 2015-01-23 MED ORDER — ONE-DAILY MULTI VITAMINS PO TABS: 1.0000 | ORAL_TABLET | Freq: Every day | ORAL | Status: DC

## 2015-01-23 MED ORDER — CETIRIZINE HCL 10 MG PO TABS: 10.0000 mg | ORAL_TABLET | Freq: Every day | ORAL | Status: DC

## 2015-01-23 NOTE — Telephone Encounter (Signed)
[facility] - please call pt and let her know I have refilled her medicines but she needs to schedule an appointment to follow up on her allergies. Thanks!  Latrelle DodrillBrittany J Brittnae Aschenbrenner, MD

## 2015-01-24 NOTE — Telephone Encounter (Signed)
Called patient, no answer, no voicemail. Will try again later

## 2015-01-25 NOTE — Telephone Encounter (Signed)
Pt informed and scheduled for her follow up appt. Jetson Pickrel Bruna PotterBlount, CMA

## 2015-02-07 ENCOUNTER — Ambulatory Visit: Payer: Medicaid Other | Admitting: Family Medicine

## 2015-02-27 ENCOUNTER — Other Ambulatory Visit (HOSPITAL_COMMUNITY): Payer: Self-pay | Admitting: *Deleted

## 2015-02-27 MED ORDER — AMLODIPINE BESYLATE 10 MG PO TABS: 10.0000 mg | ORAL_TABLET | Freq: Every day | ORAL | Status: DC

## 2015-03-01 ENCOUNTER — Encounter: Payer: Self-pay | Admitting: Family Medicine

## 2015-03-01 ENCOUNTER — Ambulatory Visit (INDEPENDENT_AMBULATORY_CARE_PROVIDER_SITE_OTHER): Payer: Medicaid Other | Admitting: Family Medicine

## 2015-03-01 VITALS — BP 136/82 | HR 79 | Temp 98.1°F | Wt 201.0 lb

## 2015-03-01 DIAGNOSIS — H6120 Impacted cerumen, unspecified ear: Secondary | ICD-10-CM | POA: Insufficient documentation

## 2015-03-01 DIAGNOSIS — J309 Allergic rhinitis, unspecified: Secondary | ICD-10-CM

## 2015-03-01 DIAGNOSIS — H6123 Impacted cerumen, bilateral: Secondary | ICD-10-CM

## 2015-03-01 DIAGNOSIS — J209 Acute bronchitis, unspecified: Secondary | ICD-10-CM

## 2015-03-01 MED ORDER — FLUTICASONE PROPIONATE 50 MCG/ACT NA SUSP: 2.0000 | Freq: Every day | NASAL | Status: DC

## 2015-03-01 MED ORDER — CARBAMIDE PEROXIDE 6.5 % OT SOLN: 5.0000 [drp] | Freq: Two times a day (BID) | OTIC | Status: DC

## 2015-03-01 MED ORDER — AZITHROMYCIN 250 MG PO TABS: ORAL_TABLET | ORAL | Status: DC

## 2015-03-01 NOTE — Patient Instructions (Signed)
Dear Leslie Gates, Thank you for coming in to clinic today.  1. It sounds like you have Acute Bronchitis - probably started with a viral infection and now seems to be likely bacterial. Take Azithromycin Z pak - 2 tablets today, then 1 tablet for next 4 days. 2. Start Flonase 2 sprays daily 3. Continue Claritin 4. For ear wax - use Debrox (if not covered, purchase OTC) - each ear. Avoid Q-tip use  If symptoms do not improve or worsening with Chest pain, worsening shortness of breath (especially on activity or wake you up from sleep), swelling in legs, or other concerning symptoms please call clinic or go directly to ED for further evaluation  Please schedule a follow-up appointment with Dr. Pollie MeyerMcIntyre in 2 to 4 weeks to follow-up labs  If you have any other questions or concerns, please feel free to call the clinic to contact me. You may also schedule an earlier appointment if necessary.  However, if your symptoms get significantly worse, please go to the Emergency Department to seek immediate medical attention.  Saralyn PilarAlexander Shailen Thielen, DO [facility]

## 2015-03-01 NOTE — Assessment & Plan Note (Addendum)
Consistent with worsening acute bronchitis in setting of likely of initial viral URI etiology (+sick contact at home). Possible allergic or rhinosinusitis component as well. Concern with duration >1 week, immunocompromised patient with HIV (however well controlled), also significant h/o OSA/OHS, prior diastolic CHF (since improved on last ECHO). - Reassuring with afebrile, stable vitals, no hypoxia (POx 99% on RA), lungs clear without wheezing or focal crackles, no evidence of acute infection on exam  Plan: 1. Start Azithromycin Z-pak x 5 days 2. Continue Albuterol PRN if helping 3. Supportive care with nasal saline OTC, hydration 4. Strict return criteria reviewed, when to go to ED if worsening or new symptoms and when to f/u if abx not improving

## 2015-03-01 NOTE — Progress Notes (Signed)
   Subjective:    Patient ID: Leslie Gates, female    DOB: May 31, 1966, 49 y.o.   MRN: [id number]  Patient presents for a same day appointment.   HPI  URI / CONGESTION / COUGH: - Reports symptoms started about 2 weeks ago with nasal and sinus congestion, sneezing, non-productive cough. Initial URI symptoms improved but now has had persistent chest congestion and cough. - No recent prior illnesses or antibiotic courses. Tried some OTC meds without relief. Also recently has been using Albuterol now 2x daily with some relief - Sick contact at home 48 yr old son with same symptoms - Followed by Pulmonology and Cardiology, previous history of OSA / OHS wears CPAP 2 L O2 at night - Admits to SOB with coughing spells, DOE at baseline - Denies any CP or pressure, orthopnea, lower ext swelling, HA, rash, nausea / vomiting, diarrhea, abdominal pain  PMH: HIV, controlled on HAART (followed by ID clinic)  I have reviewed and updated the following as appropriate: allergies and current medications  Social Hx: - Never smoker  Review of Systems  See above HPI    Objective:   Physical Exam  BP 136/82 mmHg  Pulse 79  Temp(Src) 98.1 F (36.7 C) (Oral)  Wt 201 lb (91.173 kg)  SpO2 99%  Gen - well-appearing, pleasant and cooperative, NAD HEENT - NCAT sinuses non-tender, PERRL, EOMI, b/l TM's clear with notable ear wax not impacted and R-ear with some evidence of cotton Q-tip piece mixed in with wax, mild edematous turbinates b/l w/ congestion, oropharynx clear w/o erythema, exudates or asymmetry, MMM Neck - supple, non-tender, no LAD Heart - RRR, no murmurs heard Lungs - Mostly CTAB, no wheezing, focal crackles, or rhonchi. Good air movement. Normal work of breathing. Ext - non-tender, no edema, peripheral pulses intact +2 b/l Skin - warm, dry, no rashes Neuro - awake, alert, oriented     Assessment & Plan:   See specific A&P problem list for details.

## 2015-03-01 NOTE — Assessment & Plan Note (Signed)
Continue Claritin 10mg  OTC trial x 1 month, may continue if improved Start Flonase 2 sprays BID x 1 month, may continue if improved then PRN

## 2015-03-01 NOTE — Assessment & Plan Note (Signed)
Sent rx for debrox, given b/l ear wax. Advised against Q-tip use

## 2015-03-04 NOTE — Progress Notes (Signed)
I was preceptor the day of this visit.   

## 2015-03-29 ENCOUNTER — Other Ambulatory Visit: Payer: Self-pay | Admitting: Pulmonary Disease

## 2015-04-04 ENCOUNTER — Ambulatory Visit (INDEPENDENT_AMBULATORY_CARE_PROVIDER_SITE_OTHER): Payer: Medicaid Other | Admitting: Family Medicine

## 2015-04-04 ENCOUNTER — Encounter: Payer: Self-pay | Admitting: Family Medicine

## 2015-04-04 VITALS — BP 160/81 | HR 65 | Temp 97.9°F | Ht 60.0 in | Wt 200.0 lb

## 2015-04-04 DIAGNOSIS — H6123 Impacted cerumen, bilateral: Secondary | ICD-10-CM | POA: Diagnosis not present

## 2015-04-04 DIAGNOSIS — R0600 Dyspnea, unspecified: Secondary | ICD-10-CM | POA: Diagnosis not present

## 2015-04-04 DIAGNOSIS — Z13220 Encounter for screening for lipoid disorders: Secondary | ICD-10-CM | POA: Diagnosis not present

## 2015-04-04 NOTE — Patient Instructions (Addendum)
Stop q tips. These might make your ear wax worse. [facility] phone number: Phone: [phone]   On your way out, schedule an appointment one morning to come back for fasting labs. Do not eat or drink anything other than water the morning of your lab appointment until after your labs are drawn.   We will call you with echo appointment  Call Dr. Evlyn CourierSood's office for an appointment about your breathing  Follow up with me in about 4 weeks  Be well, Dr. Pollie MeyerMcIntyre

## 2015-04-04 NOTE — Progress Notes (Signed)
Patient ID: Leslie Gates, female   DOB: [birth year], 49 y.o.   MRN: [id number]  HPI:  Having problems with R ear. Waxy. Has earaches. Goes swimming frequently. [facility] is her audiology/hearing aid provider and they recently moved. She is not sure where they moved to. Has not tried calling their office.  Swelling: Both legs swell in feet. Gets short of breath with walking. Goes to heart failure clinic for pulmonary arterial hypertension but hasn't been seen there in a while. Is also followed by Dr. Craige CottaSood of pulmonology for nocturnal hypoxia. Coughs at night and is on oxygen. Thinks dyspnea has gotten worse over the past 2 months. Hasn't seen Dr. Craige CottaSood during this time.  Needs albuterol about 2x/day for 2 months.  ROS: See HPI.  PMFSH: hx HTN, pulmonary HTN, OSA, allergic rhinitis, HIV  PHYSICAL EXAM: BP 160/81 mmHg  Pulse 65  Temp(Src) 97.9 F (36.6 C) (Oral)  Ht 5' (1.524 m)  Wt 200 lb (90.719 kg)  BMI 39.06 kg/m2  LMP 03/18/2015  O2 sat >95% on room air with ambulation Gen: NAD, pleasant, cooperative HEENT: NCAT. R tympanostomy tube in place. Moderate ear wax bilaterally Heart: RRR no murmur Lungs: CTAB NWOB Neuro: grossly nonfocal speech normal Ext: 1-2+ edema bilat lower ext  ASSESSMENT/PLAN:  Excessive ear wax With hx of tympanostomy tubes (and one visible in place today) pt is not a candidate for irrigation or chemical removal with debrox. Counseled her to stop this. Her main issue is likely overuse of qtips. Counseled to stop this. She did not tolerate me attempting to remove wax with curette today If wax not improved with cessation of qtips consider referral to ENT for other options since we are limited here. Gave phone number for [facility] so she can call and set up appt for hearing aids   Dyspnea Gradual increase in dyspnea and swelling over last 2 months. Pt with known PAH, OSA, OHS. Continue lasix at present dose Check echo Pt to return for  fasting labs for additional risk stratification (lipids), also check CBC and CMET. Pt to schedule f/u appt with pulmonologist Will likely recommend she go back to CHF clinic but will wait for echo results before recommending this    FOLLOW UP: F/u in 4 weeks with me for above issues Schedule appt with pulmonologist  GrenadaBrittany J. Pollie MeyerMcIntyre, MD [facility]

## 2015-04-05 ENCOUNTER — Ambulatory Visit (HOSPITAL_COMMUNITY): Admission: RE | Admit: 2015-04-05 | Payer: Medicaid Other | Source: Ambulatory Visit

## 2015-04-08 DIAGNOSIS — R0600 Dyspnea, unspecified: Secondary | ICD-10-CM | POA: Insufficient documentation

## 2015-04-08 NOTE — Assessment & Plan Note (Addendum)
With hx of tympanostomy tubes (and one visible in place today) pt is not a candidate for irrigation or chemical removal with debrox. Counseled her to stop this. Her main issue is likely overuse of qtips. Counseled to stop this. She did not tolerate me attempting to remove wax with curette today If wax not improved with cessation of qtips consider referral to ENT for other options since we are limited here. Gave phone number for [facility] so she can call and set up appt for hearing aids

## 2015-04-08 NOTE — Assessment & Plan Note (Addendum)
Gradual increase in dyspnea and swelling over last 2 months. Pt with known PAH, OSA, OHS. Continue lasix at present dose Check echo Pt to return for fasting labs for additional risk stratification (lipids), also check CBC and CMET. Pt to schedule f/u appt with pulmonologist Will likely recommend she go back to CHF clinic but will wait for echo results before recommending this

## 2015-04-10 ENCOUNTER — Telehealth: Payer: Self-pay | Admitting: Family Medicine

## 2015-04-10 DIAGNOSIS — H6123 Impacted cerumen, bilateral: Secondary | ICD-10-CM

## 2015-04-10 NOTE — Telephone Encounter (Signed)
Would like ENT referral for problems discussed at office visit on 5/19. Will forward to PCP.

## 2015-04-10 NOTE — Telephone Encounter (Signed)
Pt called and would like a referral to see an ENT doctor. Please let her know when this is done. jw

## 2015-04-10 NOTE — Progress Notes (Signed)
I was the preceptor for this visit. 

## 2015-04-11 NOTE — Telephone Encounter (Signed)
Referral entered, please inform patient. Leslie DodrillBrittany J Luca Dyar, MD

## 2015-04-12 ENCOUNTER — Other Ambulatory Visit: Payer: Medicaid Other

## 2015-04-12 ENCOUNTER — Encounter: Payer: Self-pay | Admitting: Family Medicine

## 2015-04-12 ENCOUNTER — Ambulatory Visit (INDEPENDENT_AMBULATORY_CARE_PROVIDER_SITE_OTHER): Payer: Medicaid Other | Admitting: Pulmonary Disease

## 2015-04-12 ENCOUNTER — Encounter: Payer: Self-pay | Admitting: Pulmonary Disease

## 2015-04-12 ENCOUNTER — Telehealth: Payer: Self-pay | Admitting: Pulmonary Disease

## 2015-04-12 ENCOUNTER — Ambulatory Visit (INDEPENDENT_AMBULATORY_CARE_PROVIDER_SITE_OTHER)
Admission: RE | Admit: 2015-04-12 | Discharge: 2015-04-12 | Disposition: A | Discharge status: Home / Self Care | Payer: Medicaid Other | Source: Ambulatory Visit | Attending: Pulmonary Disease | Admitting: Pulmonary Disease

## 2015-04-12 ENCOUNTER — Ambulatory Visit (HOSPITAL_COMMUNITY)
Admission: RE | Admit: 2015-04-12 | Discharge: 2015-04-12 | Disposition: A | Discharge status: Home / Self Care | Payer: Medicaid Other | Source: Ambulatory Visit | Attending: Family Medicine | Admitting: Family Medicine

## 2015-04-12 VITALS — BP 138/72 | HR 81 | Ht 62.0 in | Wt 201.0 lb

## 2015-04-12 DIAGNOSIS — R0600 Dyspnea, unspecified: Secondary | ICD-10-CM

## 2015-04-12 DIAGNOSIS — Z13220 Encounter for screening for lipoid disorders: Secondary | ICD-10-CM

## 2015-04-12 DIAGNOSIS — G4733 Obstructive sleep apnea (adult) (pediatric): Secondary | ICD-10-CM | POA: Diagnosis not present

## 2015-04-12 DIAGNOSIS — E662 Morbid (severe) obesity with alveolar hypoventilation: Secondary | ICD-10-CM | POA: Diagnosis not present

## 2015-04-12 LAB — COMPREHENSIVE METABOLIC PANEL
ALBUMIN: 4.2 g/dL (ref 3.5–5.2)
ALT: 13 U/L (ref 0–35)
AST: 18 U/L (ref 0–37)
Alkaline Phosphatase: 73 U/L (ref 39–117)
BILIRUBIN TOTAL: 0.4 mg/dL (ref 0.2–1.2)
BUN: 13 mg/dL (ref 6–23)
CALCIUM: 9.6 mg/dL (ref 8.4–10.5)
CO2: 27 mEq/L (ref 19–32)
CREATININE: 0.69 mg/dL (ref 0.50–1.10)
Chloride: 101 mEq/L (ref 96–112)
Glucose, Bld: 86 mg/dL (ref 70–99)
POTASSIUM: 4.5 meq/L (ref 3.5–5.3)
Sodium: 135 mEq/L (ref 135–145)
Total Protein: 8.2 g/dL (ref 6.0–8.3)

## 2015-04-12 LAB — LIPID PANEL
CHOLESTEROL: 172 mg/dL (ref 0–200)
HDL: 52 mg/dL (ref >=46)
LDL CALC: 90 mg/dL (ref 0–99)
Total CHOL/HDL Ratio: 3.3 Ratio
Triglycerides: 152 mg/dL — ABNORMAL HIGH (ref <150)
VLDL: 30 mg/dL (ref 0–40)

## 2015-04-12 LAB — CBC
HEMATOCRIT: 36.8 % (ref 36.0–46.0)
Hemoglobin: 12.3 g/dL (ref 12.0–15.0)
MCH: 28.8 pg (ref 26.0–34.0)
MCHC: 33.4 g/dL (ref 30.0–36.0)
MCV: 86.2 fL (ref 78.0–100.0)
MPV: 9.7 fL (ref 8.6–12.4)
Platelets: 307 10*3/uL (ref 150–400)
RBC: 4.27 MIL/uL (ref 3.87–5.11)
RDW: 14.3 % (ref 11.5–15.5)
WBC: 4.4 10*3/uL (ref 4.0–10.5)

## 2015-04-12 MED ORDER — ALBUTEROL SULFATE HFA 108 (90 BASE) MCG/ACT IN AERS
1.0000 | INHALATION_SPRAY | Freq: Four times a day (QID) | RESPIRATORY_TRACT | Status: DC | PRN
Start: 2015-04-12 — End: 2015-10-03

## 2015-04-12 MED ORDER — BECLOMETHASONE DIPROPIONATE 80 MCG/ACT IN AERS: 2.0000 | INHALATION_SPRAY | Freq: Two times a day (BID) | RESPIRATORY_TRACT | Status: DC

## 2015-04-12 NOTE — Progress Notes (Signed)
  Echocardiogram 2D Echocardiogram has been performed.  Nolon RodBrown, Tony 04/12/2015, 11:50 AM

## 2015-04-12 NOTE — Telephone Encounter (Signed)
Patient informed referral was faxed

## 2015-04-12 NOTE — Progress Notes (Signed)
Chief Complaint  Patient presents with  . Acute Visit    Pt c/o increased SOB x 3 weeks with cough, little mucus production and chest tightness at times.     History of Present Illness: Leslie Gates is a 49 y.o. female with OSA, and OHS.  She is here to assess her shortness of breath.  She has noticed this over the past 3 weeks.  This coincides with more allergy problems.  She has a cough with clear sputum.  She denies fever or hemoptysis.  She gets tight in her chest.  She is not aware of wheezing.  She has been using albuterol and this seems to help.  She had Echo earlier today which was unremarkable.  She has been using CPAP at night with oxygen w/o difficulty.  She had lab tests order by PCP from 04/04/15, but has not done these yet.   TESTS: PSG 06/08/04 >> RDI 92.1, oxygen nadir 57% ONO on RA 08/03/13 >> 203 min with SpO2 < 88% Echo 08/22/13 >> EF 60 to 65%, PAS 29 mmHg Split night 12/22/13 >> AHI 27, SaO2 low 77%. CPAP 12 cm H2O >> AHI 5.9, +R, +S. Did not need supplemental oxygen. PFT 01/29/14 >> FEV1 1.79 (81%), FEV1% 86, TLC 3.47 (74%), DLCO 71% ONO with CPAP and 2 liters 02/13/14 >> Test time 8 hrs 36 min. Basal SpO2 98%, low SpO2 63% (artifact). Spent 0.4 min with SpO2 < 88%.  CPAP 02/13/13 to 02/12/14 >> Used on 340 of 365 nights with average 5 hrs 55 min. Average AHI 3.8 with CPAP 12 cm H2O. Echo 04/12/15 >> EF 65 to 70%, PAS 35 mmHg  PMHx >> diastolic CHF, HIV, HTN ,CAD  PSHx, Medications, Allergies, Fhx, Shx reviewed.  Physical Exam: Blood pressure 126/88, pulse 78, height 5\' 2"  (1.575 m), weight 201 lb (91.173 kg), SpO2 96 %. Body mass index is 36.75 kg/(m^2).  General - No distress ENT - No sinus tenderness, no oral exudate, MP 4, scalloped tongue, no LAN Cardiac - s1s2 regular, no murmur Chest - No wheeze/rales/dullness Back - No focal tenderness Abd - Soft, non-tender Ext - ankle edema Neuro - Normal strength Skin - No rashes Psych - normal mood, and  behavior   Assessment/Plan:  Dyspnea. Her Echo was unremarkable.  Her symptoms are more suggestive of asthma. Plan: - will have her do CXR and get labs tests as previously scheduled - will have her use Qvar - refilled her albuterol  OSA >> she is compliant with CPAP and reports benefit. Obesity hypoventilation syndrome. Plan: - continue CPAP with 2 liters oxygen at night  Ankle edema. Explained this could be related to amlodipine. Plan: - f/u with PCP    Coralyn HellingVineet Jie Stickels, MD [facility] Pager:  [phone]

## 2015-04-12 NOTE — Telephone Encounter (Signed)
Dg Chest 2 View  04/12/2015   CLINICAL DATA:  Shortness of breath and chest tightness for 3 weeks  EXAM: CHEST  2 VIEW  COMPARISON:  April 25, 2014  FINDINGS: Lungs are clear. Heart size and pulmonary vascularity are normal. No pneumothorax. No adenopathy. No bone lesions.  IMPRESSION: No edema or consolidation.   Electronically Signed   By: Bretta BangWilliam  Woodruff III M.D.   On: 04/12/2015 15:46    Will have my nurse inform pt that chest xray was normal.

## 2015-04-12 NOTE — Patient Instructions (Signed)
Lab test and chest xray today Will schedule pulmonary function testing Qvar two puffs twice per day >> rinse mouth after each use Albuterol 2 puffs every 6 hours as needed for cough, wheeze, or chest congestion Follow up with Tammy Parrett in 2 weeks

## 2015-04-17 NOTE — Telephone Encounter (Signed)
Results have been explained to patient, pt expressed understanding. Nothing further needed.  

## 2015-04-19 ENCOUNTER — Encounter: Payer: Self-pay | Admitting: Family Medicine

## 2015-04-24 ENCOUNTER — Other Ambulatory Visit: Payer: Self-pay | Admitting: *Deleted

## 2015-04-24 ENCOUNTER — Other Ambulatory Visit: Payer: Medicaid Other

## 2015-04-24 DIAGNOSIS — Z113 Encounter for screening for infections with a predominantly sexual mode of transmission: Secondary | ICD-10-CM

## 2015-04-24 DIAGNOSIS — B20 Human immunodeficiency virus [HIV] disease: Secondary | ICD-10-CM

## 2015-04-25 ENCOUNTER — Other Ambulatory Visit: Payer: Medicaid Other

## 2015-04-25 DIAGNOSIS — B20 Human immunodeficiency virus [HIV] disease: Secondary | ICD-10-CM

## 2015-04-25 DIAGNOSIS — Z113 Encounter for screening for infections with a predominantly sexual mode of transmission: Secondary | ICD-10-CM

## 2015-04-25 LAB — COMPLETE METABOLIC PANEL WITH GFR
ALK PHOS: 85 U/L (ref 39–117)
ALT: 15 U/L (ref 0–35)
AST: 21 U/L (ref 0–37)
Albumin: 4.2 g/dL (ref 3.5–5.2)
BUN: 10 mg/dL (ref 6–23)
CO2: 29 mEq/L (ref 19–32)
Calcium: 10 mg/dL (ref 8.4–10.5)
Chloride: 104 mEq/L (ref 96–112)
Creat: 0.7 mg/dL (ref 0.50–1.10)
GFR, Est Non African American: >89 mL/min
Glucose, Bld: 76 mg/dL (ref 70–99)
Potassium: 4.5 mEq/L (ref 3.5–5.3)
Sodium: 140 mEq/L (ref 135–145)
TOTAL PROTEIN: 8 g/dL (ref 6.0–8.3)
Total Bilirubin: 0.4 mg/dL (ref 0.2–1.2)

## 2015-04-25 LAB — CBC WITH DIFFERENTIAL/PLATELET
Basophils Absolute: 0 10*3/uL (ref 0.0–0.1)
Basophils Relative: 1 % (ref 0–1)
EOS PCT: 4 % (ref 0–5)
Eosinophils Absolute: 0.2 10*3/uL (ref 0.0–0.7)
HEMATOCRIT: 35.8 % — AB (ref 36.0–46.0)
Hemoglobin: 11.9 g/dL — ABNORMAL LOW (ref 12.0–15.0)
LYMPHS PCT: 46 % (ref 12–46)
Lymphs Abs: 1.9 10*3/uL (ref 0.7–4.0)
MCH: 28.7 pg (ref 26.0–34.0)
MCHC: 33.2 g/dL (ref 30.0–36.0)
MCV: 86.5 fL (ref 78.0–100.0)
MPV: 9.2 fL (ref 8.6–12.4)
Monocytes Absolute: 0.3 10*3/uL (ref 0.1–1.0)
Monocytes Relative: 8 % (ref 3–12)
Neutro Abs: 1.7 10*3/uL (ref 1.7–7.7)
Neutrophils Relative %: 41 % — ABNORMAL LOW (ref 43–77)
PLATELETS: 348 10*3/uL (ref 150–400)
RBC: 4.14 MIL/uL (ref 3.87–5.11)
RDW: 14.6 % (ref 11.5–15.5)
WBC: 4.1 10*3/uL (ref 4.0–10.5)

## 2015-04-25 LAB — RPR

## 2015-04-26 LAB — HIV-1 RNA QUANT-NO REFLEX-BLD: HIV-1 RNA Quant, Log: <1.3 {Log} (ref <1.30)

## 2015-04-26 LAB — T-HELPER CELL (CD4) - (RCID CLINIC ONLY)
CD4 % Helper T Cell: 24 % — ABNORMAL LOW (ref 33–55)
CD4 T Cell Abs: 430 /uL (ref 400–2700)

## 2015-04-29 ENCOUNTER — Encounter: Payer: Self-pay | Admitting: Adult Health

## 2015-04-29 ENCOUNTER — Ambulatory Visit (INDEPENDENT_AMBULATORY_CARE_PROVIDER_SITE_OTHER): Payer: Medicaid Other | Admitting: Adult Health

## 2015-04-29 ENCOUNTER — Ambulatory Visit (HOSPITAL_COMMUNITY)
Admission: RE | Admit: 2015-04-29 | Discharge: 2015-04-29 | Disposition: A | Discharge status: Home / Self Care | Payer: Medicaid Other | Source: Ambulatory Visit | Attending: Pulmonary Disease | Admitting: Pulmonary Disease

## 2015-04-29 VITALS — BP 118/80 | HR 89 | Temp 98.3°F | Ht 63.0 in | Wt 201.0 lb

## 2015-04-29 DIAGNOSIS — R0600 Dyspnea, unspecified: Secondary | ICD-10-CM

## 2015-04-29 DIAGNOSIS — R0602 Shortness of breath: Secondary | ICD-10-CM

## 2015-04-29 LAB — PULMONARY FUNCTION TEST
DL/VA % pred: 95 %
DL/VA: 4.47 ml/min/mmHg/L
DLCO COR: 11.21 ml/min/mmHg
DLCO UNC: 10.66 ml/min/mmHg
DLCO cor % pred: 48 %
DLCO unc % pred: 46 %
FEF 25-75 PRE: 1.46 L/s
FEF 25-75 Post: 1.35 L/sec
FEF2575-%CHANGE-POST: -7 %
FEF2575-%PRED-POST: 54 %
FEF2575-%Pred-Pre: 59 %
FEV1-%CHANGE-POST: -1 %
FEV1-%Pred-Post: 65 %
FEV1-%Pred-Pre: 66 %
FEV1-Post: 1.51 L
FEV1-Pre: 1.53 L
FEV1FVC-%Change-Post: 6 %
FEV1FVC-%Pred-Pre: 96 %
FEV6-%Change-Post: -6 %
FEV6-%PRED-POST: 64 %
FEV6-%Pred-Pre: 69 %
FEV6-POST: 1.78 L
FEV6-Pre: 1.91 L
FEV6FVC-%Change-Post: 0 %
FEV6FVC-%PRED-PRE: 102 %
FEV6FVC-%Pred-Post: 103 %
FVC-%CHANGE-POST: -7 %
FVC-%PRED-POST: 62 %
FVC-%Pred-Pre: 68 %
FVC-Post: 1.78 L
FVC-Pre: 1.93 L
POST FEV6/FVC RATIO: 100 %
Post FEV1/FVC ratio: 85 %
Pre FEV1/FVC ratio: 80 %
Pre FEV6/FVC Ratio: 99 %
RV % pred: 104 %
RV: 1.77 L
TLC % PRED: 76 %
TLC: 3.74 L

## 2015-04-29 MED ORDER — ALBUTEROL SULFATE (2.5 MG/3ML) 0.083% IN NEBU
2.5000 mg | INHALATION_SOLUTION | Freq: Once | RESPIRATORY_TRACT | Status: AC
  Administered 2015-04-29: 2.5 mg via RESPIRATORY_TRACT

## 2015-04-29 NOTE — Patient Instructions (Signed)
Continue on current regimen  Follow up Dr. Craige Cotta  In 6- 8 weeks and As needed   Please contact office for sooner follow up if symptoms do not improve or worsen or seek emergency care

## 2015-04-29 NOTE — Assessment & Plan Note (Addendum)
Dyspnea ? Asthma component  PFT w/ restrictive component and low DLCO . No airflow obstruction  No desats on RA .  CXR clear . Symptoms improved on QVAR .  Return in 6-8 weeks for follow up  Consider repeat spirometry on return

## 2015-04-29 NOTE — Progress Notes (Signed)
Subjective:    Patient ID: Leslie Gates, female    DOB: 1965/11/24, 49 y.o.   MRN: [id number]  HPI 49 yo female with OSA, OHS, HIV, PAH Never smoker   04/29/2015 Follow up Pt presents for a 3 week follow up .  Seen last ov with increased dyspnea.  Started on QVAR .  Says she is feeling better with less dyspnea.   CXR clear last ov.  PFT shows drop in lung fxn from last year  FEV1 66%, ratio 80 , DLCO 46% , no BD response. .  Denies cough, chest pain, orthopnea, edema , fever or rash.  Echo last month with preserved EF and PAP slightly up at 35.  Discussed test results with pt . Pt was on her phone 2 separate times  During office visit.     TESTS: PSG 06/08/04 >> RDI 92.1, oxygen nadir 57% ONO on RA 08/03/13 >> 203 min with SpO2 < 88% Echo 08/22/13 >> EF 60 to 65%, PAS 29 mmHg Split night 12/22/13 >> AHI 27, SaO2 low 77%. CPAP 12 cm H2O >> AHI 5.9, +R, +S. Did not need supplemental oxygen. PFT 01/29/14 >> FEV1 1.79 (81%), FEV1% 86, TLC 3.47 (74%), DLCO 71% ONO with CPAP and 2 liters 02/13/14 >> Test time 8 hrs 36 min. Basal SpO2 98%, low SpO2 63% (artifact). Spent 0.4 min with SpO2 < 88%.  CPAP 02/13/13 to 02/12/14 >> Used on 340 of 365 nights with average 5 hrs 55 min. Average AHI 3.8 with CPAP 12 cm H2O. Echo 04/12/15 >> EF 65 to 70%, PAS 35 mmHg    Review of Systems  Constitutional:   No  weight loss, night sweats,  Fevers, chills,  +fatigue, or  lassitude.  HEENT:   No headaches,  Difficulty swallowing,  Tooth/dental problems, or  Sore throat,                No sneezing, itching, ear ache, nasal congestion, post nasal drip,   CV:  No chest pain,  Orthopnea, PND, swelling in lower extremities, anasarca, dizziness, palpitations, syncope.   GI  No heartburn, indigestion, abdominal pain, nausea, vomiting, diarrhea, change in bowel habits, loss of appetite, bloody stools.   Resp: .  No excess mucus, no productive cough,  No non-productive cough,  No coughing up of blood.  No  change in color of mucus.  No wheezing.  No chest wall deformity  Skin: no rash or lesions.  GU: no dysuria, change in color of urine, no urgency or frequency.  No flank pain, no hematuria   MS:  No joint pain or swelling.  No decreased range of motion.  No back pain.  Psych:  No change in mood or affect. No depression or anxiety.  No memory loss.          Objective:   Physical Exam  GEN: A/Ox3; pleasant , NAD, well nourished   HEENT:  [place]/AT,  EACs-clear, TMs-wnl, NOSE-clear, THROAT-clear, no lesions, no postnasal drip or exudate noted.   NECK:  Supple w/ fair ROM; no JVD; normal carotid impulses w/o bruits; no thyromegaly or nodules palpated; no lymphadenopathy.  RESP  Clear  P & A; w/o, wheezes/ rales/ or rhonchi.no accessory muscle use, no dullness to percussion  CARD:  RRR, no m/r/g  , no peripheral edema, pulses intact, no cyanosis or clubbing.  GI:   Soft & nt; nml bowel sounds; no organomegaly or masses detected.  Musco: Warm bil, no deformities or joint swelling  noted.   Neuro: alert, no focal deficits noted.    Skin: Warm, no lesions or rashes        Assessment & Plan:

## 2015-04-30 NOTE — Progress Notes (Signed)
Reviewed and agree with assessment/plan.  Her PFT shows mild restriction, and significant diffusion defect.  ?if she could have emphysema from HIV or ILD not readily apparent on CXR.  Consider doing HRCT chest.

## 2015-05-01 ENCOUNTER — Other Ambulatory Visit: Payer: Self-pay | Admitting: *Deleted

## 2015-05-01 DIAGNOSIS — L299 Pruritus, unspecified: Secondary | ICD-10-CM

## 2015-05-01 MED ORDER — CETIRIZINE HCL 10 MG PO TABS: 10.0000 mg | ORAL_TABLET | Freq: Every day | ORAL | Status: DC

## 2015-05-01 MED ORDER — ONE-DAILY MULTI VITAMINS PO TABS: 1.0000 | ORAL_TABLET | Freq: Every day | ORAL | Status: DC

## 2015-05-08 ENCOUNTER — Ambulatory Visit (INDEPENDENT_AMBULATORY_CARE_PROVIDER_SITE_OTHER): Payer: Medicaid Other | Admitting: Internal Medicine

## 2015-05-08 VITALS — BP 133/81 | HR 71 | Temp 98.3°F | Ht 62.0 in | Wt 204.0 lb

## 2015-05-08 DIAGNOSIS — I10 Essential (primary) hypertension: Secondary | ICD-10-CM

## 2015-05-08 DIAGNOSIS — R635 Abnormal weight gain: Secondary | ICD-10-CM

## 2015-05-08 DIAGNOSIS — B20 Human immunodeficiency virus [HIV] disease: Secondary | ICD-10-CM

## 2015-05-08 MED ORDER — ELVITEG-COBIC-EMTRICIT-TENOFAF 150-150-200-10 MG PO TABS: 1.0000 | ORAL_TABLET | Freq: Every day | ORAL | Status: DC

## 2015-05-08 NOTE — Progress Notes (Signed)
Patient ID: Leslie Gates, female   DOB: 06-22-1966, 49 y.o.   MRN: [id number]       Patient ID: Leslie Gates, female   DOB: [birth year], 49 y.o.   MRN: [id number]  HPI 49yo F with HIV disease, CD 4 count 430/VL<20, on stribild. Last seen 6 months ago. She continues to do well with taking her medications. She denies any recent illnesses since last visit. Her son helps her with taking her medications to remind her with med adherence. She is concerned about her weight gain over the last year. She describes having large portions of starch, simple carbs, and protein such as chicken, and steak.   Outpatient Encounter Prescriptions as of 05/08/2015  Medication Sig  . amLODipine (NORVASC) 10 MG tablet Take 1 tablet (10 mg total) by mouth daily.  . beclomethasone (QVAR) 80 MCG/ACT inhaler Inhale 2 puffs into the lungs 2 (two) times daily.  . carbamide peroxide (DEBROX) 6.5 % otic solution Place 5 drops into both ears 2 (two) times daily.  . cetirizine (ZYRTEC) 10 MG tablet Take 1 tablet (10 mg total) by mouth daily.  Marland Kitchen elvitegravir-cobicistat-emtricitabine-tenofovir (STRIBILD) 150-150-200-300 MG TABS tablet Take 1 tablet by mouth daily with breakfast.  . fluticasone (FLONASE) 50 MCG/ACT nasal spray Place 2 sprays into both nostrils daily.  . furosemide (LASIX) 20 MG tablet TAKE 1 TABLET BY MOUTH EVERY DAY  . loratadine (CLARITIN) 10 MG tablet Take 1 tablet (10 mg total) by mouth daily.  . Multiple Vitamin (MULTIVITAMIN) tablet Take 1 tablet by mouth daily.  . traMADol (ULTRAM) 50 MG tablet Take 1-2 tablets (50-100 mg total) by mouth every 6 (six) hours as needed (pain).  Marland Kitchen albuterol (PROAIR HFA) 108 (90 BASE) MCG/ACT inhaler Inhale 1-2 puffs into the lungs every 6 (six) hours as needed for wheezing or shortness of breath. (Patient not taking: Reported on 05/08/2015)   No facility-administered encounter medications on file as of 05/08/2015.     Patient Active Problem List   Diagnosis Date Noted  .  Dyspnea 04/08/2015  . Excessive ear wax 03/01/2015  . Knee pain, acute 10/23/2014  . Moderate dysplasia of cervix 07/10/2013  . Chemical conjunctivitis 09/22/2012  . Angioedema of lips 09/09/2011  . Rash 08/12/2011  . Acute bronchitis 04/10/2011  . Obesity hypoventilation syndrome 07/10/2010  . Obesity (BMI 30.0-34.9) 07/01/2010  . OBSTRUCTIVE SLEEP APNEA 06/27/2010  . HYPERTENSION, PULMONARY 05/28/2010  . SHORTNESS OF BREATH 05/08/2010  . Allergic rhinitis 03/14/2008  . DYSPLASIA OF CERVIX UNSPECIFIED 12/19/2007  . HERPES ZOSTER 09/15/2007  . OTITIS MEDIA, CHRONIC 03/22/2007  . HYPERTENSION 03/22/2007  . HIV DISEASE 06/21/2002     Health Maintenance Due  Topic Date Due  . TETANUS/TDAP  07/05/1986     Review of Systems + weight gain, otherwise 10 point ros is negative Physical Exam   BP 133/81 mmHg  Pulse 71  Temp(Src) 98.3 F (36.8 C) (Oral)  Ht  (1.575 m)  Wt 204 lb (92.534 kg)  BMI 37.30 kg/m2 Physical Exam  Constitutional:  oriented to person, place, and time. appears well-developed and well-nourished. No distress.  HENT: [place]/AT, PERRLA, no scleral icterus Mouth/Throat: Oropharynx is clear and moist. No oropharyngeal exudate.  Cardiovascular: Normal rate, regular rhythm and normal heart sounds. Exam reveals no gallop and no friction rub.  No murmur heard.  Pulmonary/Chest: Effort normal and breath sounds normal. No respiratory distress.  has no wheezes.  Neck = supple, no nuchal rigidity Lymphadenopathy: no cervical adenopathy. No axillary adenopathy  Skin: Skin is warm and dry. No rash noted. No erythema.  Psychiatric: a normal mood and affect.  behavior is normal.   Lab Results  Component Value Date   CD4TCELL 24* 04/25/2015   Lab Results  Component Value Date   CD4TABS 430 04/25/2015   CD4TABS 410 09/05/2014   CD4TABS 410 05/28/2014   Lab Results  Component Value Date   HIV1RNAQUANT <20 04/25/2015   Lab Results  Component Value Date    HEPBSAB NEG 02/08/2009   No results found for: RPR  CBC Lab Results  Component Value Date   WBC 4.1 04/25/2015   RBC 4.14 04/25/2015   HGB 11.9* 04/25/2015   HCT 35.8* 04/25/2015   PLT 348 04/25/2015   MCV 86.5 04/25/2015   MCH 28.7 04/25/2015   MCHC 33.2 04/25/2015   RDW 14.6 04/25/2015   LYMPHSABS 1.9 04/25/2015   MONOABS 0.3 04/25/2015   EOSABS 0.2 04/25/2015   BASOSABS 0.0 04/25/2015   BMET Lab Results  Component Value Date   NA 140 04/25/2015   K 4.5 04/25/2015   CL 104 04/25/2015   CO2 29 04/25/2015   GLUCOSE 76 04/25/2015   BUN 10 04/25/2015   CREATININE 0.70 04/25/2015   CALCIUM 10.0 04/25/2015   GFRNONAA >89 04/25/2015   GFRAA >89 04/25/2015     Assessment and Plan   hiv disease = well controlled, but will change her to genvoya  Health maintenance = will have her get mammo and pap this summer  Weight gain = recommended to try decreasing portion size by 25%, also avoid late night eating, or eating in the middle of the night.  Hypertension= controlled with amlodipine. No need for change

## 2015-05-10 ENCOUNTER — Other Ambulatory Visit: Payer: Self-pay

## 2015-05-10 DIAGNOSIS — Z1231 Encounter for screening mammogram for malignant neoplasm of breast: Secondary | ICD-10-CM

## 2015-05-16 ENCOUNTER — Encounter: Payer: Self-pay | Admitting: Certified Nurse Midwife

## 2015-05-16 ENCOUNTER — Ambulatory Visit (INDEPENDENT_AMBULATORY_CARE_PROVIDER_SITE_OTHER): Payer: Medicaid Other | Admitting: Certified Nurse Midwife

## 2015-05-16 VITALS — BP 141/89 | HR 62 | Temp 98.0°F | Ht 62.0 in | Wt 203.0 lb

## 2015-05-16 DIAGNOSIS — Z0000 Encounter for general adult medical examination without abnormal findings: Secondary | ICD-10-CM | POA: Diagnosis not present

## 2015-05-16 DIAGNOSIS — Z01419 Encounter for gynecological examination (general) (routine) without abnormal findings: Secondary | ICD-10-CM

## 2015-05-16 NOTE — Progress Notes (Signed)
Patient ID: Leslie Gates, female   DOB: 1965/11/28, 49 y.o.   MRN: [id number]    Subjective:     Leslie Gates is a 49 y.o. female here for a routine exam.  Current complaints: menses are monthly lasting about 3-4 days.  Denies any premenopausal symptoms.  Had BTL.  Is sexually active.      Personal health questionnaire:  Is patient [name], have a family history of breast and/or ovarian cancer: no Is there a family history of uterine cancer diagnosed at age < 73, gastrointestinal cancer, urinary tract cancer, family member who is a Personnel officer syndrome-associated carrier: no Is the patient overweight and hypertensive, family history of diabetes, personal history of gestational diabetes, preeclampsia or PCOS: yes Is patient over 49, have PCOS,  family history of premature CHD under age 3, diabetes, smoke, have hypertension or peripheral artery disease:  Yes, everyone has HTN, kidney dialysis and rectal bleeding.   At any time, has a partner hit, kicked or otherwise hurt or frightened you?: no Over the past 2 weeks, have you felt down, depressed or hopeless?: no Over the past 2 weeks, have you felt little interest or pleasure in doing things?:no   Gynecologic History Patient's last menstrual period was 03/19/2015 (approximate). Contraception: tubal ligation Last Pap: 05/14/14. Results were: abnormal Last mammogram: 05/2014. Results were: normal  Obstetric History OB History  No data available  G3P3L2  Past Medical History  Diagnosis Date  . Pulmonary HTN   . OSA (obstructive sleep apnea)   . Diastolic CHF, chronic   . HTN (hypertension)   . Human immunodeficiency virus (HIV) 2001  . Wrist fracture, bilateral   . History of herpes zoster   . History of cervical dysplasia   . Dysphagia   . Chronic otitis media   . Allergic rhinitis   . Obesity   . Coronary artery disease     Past Surgical History  Procedure Laterality Date  . Tympanostomy tube placement      as a  child  . Cesarean section    . Nasal sinus surgery  2004    [facility]  . Wrist fracture surgery  2006    bilateral - [facility]  . Adenoidectomy    . Right heart catheterization N/A 02/01/2012    Procedure: RIGHT HEART CATH;  Surgeon: Dolores Patty, MD;  Location: [facility] CATH LAB;  Service: Cardiovascular;  Laterality: N/A;     Current outpatient prescriptions:  .  albuterol (PROAIR HFA) 108 (90 BASE) MCG/ACT inhaler, Inhale 1-2 puffs into the lungs every 6 (six) hours as needed for wheezing or shortness of breath., Disp: 8.5 each, Rfl: 5 .  amLODipine (NORVASC) 10 MG tablet, Take 1 tablet (10 mg total) by mouth daily., Disp: 30 tablet, Rfl: 0 .  beclomethasone (QVAR) 80 MCG/ACT inhaler, Inhale 2 puffs into the lungs 2 (two) times daily., Disp: 1 Inhaler, Rfl: 2 .  cetirizine (ZYRTEC) 10 MG tablet, Take 1 tablet (10 mg total) by mouth daily., Disp: 30 tablet, Rfl: 2 .  elvitegravir-cobicistat-emtricitabine-tenofovir (GENVOYA) 150-150-200-10 MG TABS tablet, Take 1 tablet by mouth daily with breakfast., Disp: 30 tablet, Rfl: 11 .  furosemide (LASIX) 20 MG tablet, TAKE 1 TABLET BY MOUTH EVERY DAY, Disp: 30 tablet, Rfl: 5 .  loratadine (CLARITIN) 10 MG tablet, Take 1 tablet (10 mg total) by mouth daily., Disp: 30 tablet, Rfl: 4 .  Multiple Vitamin (MULTIVITAMIN) tablet, Take 1 tablet by mouth daily., Disp: 30 tablet, Rfl: 2 .  carbamide peroxide (DEBROX) 6.5 % otic solution, Place 5 drops into both ears 2 (two) times daily. (Patient not taking: Reported on 05/16/2015), Disp: 15 mL, Rfl: 0 .  fluticasone (FLONASE) 50 MCG/ACT nasal spray, Place 2 sprays into both nostrils daily. (Patient not taking: Reported on 05/16/2015), Disp: 16 g, Rfl: 1 .  traMADol (ULTRAM) 50 MG tablet, Take 1-2 tablets (50-100 mg total) by mouth every 6 (six) hours as needed (pain). (Patient not taking: Reported on 05/16/2015), Disp: 45 tablet, Rfl: 0 Allergies  Allergen Reactions  . Lisinopril Swelling    Very swollen bottom lip     History  Substance Use Topics  . Smoking status: Never Smoker   . Smokeless tobacco: Never Used  . Alcohol Use: No    Family History  Problem Relation Age of Onset  . Other Mother [id number]    ESRD  . Hyperlipidemia Mother   . Hypertension Mother   . Alcohol abuse Mother   . Other Father     GSW  . Hypertension Father   . Kidney disease Father     was on dialysis prior to death  . Alcohol abuse Father     liver failure from alcohol use  . Hypertension    . Hypertension Sister   . Alcohol abuse Sister   . Hypertension Brother   . Alcohol abuse Brother       Review of Systems  Constitutional: negative for fatigue and weight loss Respiratory: negative for cough and wheezing Cardiovascular: negative for chest pain, fatigue and palpitations Gastrointestinal: negative for abdominal pain and change in bowel habits Musculoskeletal:negative for myalgias Neurological: negative for gait problems and tremors Behavioral/Psych: negative for abusive relationship, depression Endocrine: negative for temperature intolerance   Genitourinary:negative for abnormal menstrual periods, genital lesions, hot flashes, sexual problems and vaginal discharge Integument/breast: negative for breast lump, breast tenderness, nipple discharge and skin lesion(s)    Objective:       BP 141/89 mmHg  Pulse 62  Temp(Src) 98 F (36.7 C)  Ht 5\' 2"  (1.575 m)  Wt 203 lb (92.08 kg)  BMI 37.12 kg/m2  LMP 03/19/2015 (Approximate) General:   alert  Skin:   no rash or abnormalities  Lungs:   clear to auscultation bilaterally  Heart:   regular rate and rhythm, S1, S2 normal, no murmur, click, rub or gallop  Breasts:   normal without suspicious masses, skin or nipple changes or axillary nodes  Abdomen:  normal findings: no organomegaly, soft, non-tender and no hernia  Pelvis:  External genitalia: normal general appearance Urinary system: urethral meatus normal and bladder without fullness, nontender Vaginal:  normal without tenderness, induration or masses Cervix: normal appearance Adnexa: normal bimanual exam Uterus: anteverted and non-tender, normal size   Lab Review Urine pregnancy test Labs reviewed yes Radiologic studies reviewed yes  50% of 30 min visit spent on counseling and coordination of care.   Assessment:    Healthy female exam.    Plan:    Education reviewed: depression evaluation, low fat, low cholesterol diet, safe sex/STD prevention, self breast exams, skin cancer screening and weight bearing exercise. Contraception: tubal ligation. Follow up in: 1 year.   No orders of the defined types were placed in this encounter.   Orders Placed This Encounter  Procedures  . SureSwab, Vaginosis/Vaginitis Plus

## 2015-05-19 LAB — SURESWAB, VAGINOSIS/VAGINITIS PLUS
ATOPOBIUM VAGINAE: NOT DETECTED Log (cells/mL)
C. ALBICANS, DNA: NOT DETECTED
C. TRACHOMATIS RNA, TMA: NOT DETECTED
C. glabrata, DNA: NOT DETECTED
C. parapsilosis, DNA: NOT DETECTED
C. tropicalis, DNA: NOT DETECTED
GARDNERELLA VAGINALIS: NOT DETECTED Log (cells/mL)
LACTOBACILLUS SPECIES: NOT DETECTED Log (cells/mL)
MEGASPHAERA SPECIES: NOT DETECTED Log (cells/mL)
N. GONORRHOEAE RNA, TMA: NOT DETECTED
T. vaginalis RNA, QL TMA: NOT DETECTED

## 2015-05-21 LAB — PAP IG AND HPV HIGH-RISK: HPV DNA HIGH RISK: DETECTED — AB

## 2015-05-24 ENCOUNTER — Ambulatory Visit: Payer: Medicaid Other

## 2015-06-03 ENCOUNTER — Ambulatory Visit
Admission: RE | Admit: 2015-06-03 | Discharge: 2015-06-03 | Disposition: A | Discharge status: Home / Self Care | Payer: Medicaid Other | Source: Ambulatory Visit

## 2015-06-03 DIAGNOSIS — Z1231 Encounter for screening mammogram for malignant neoplasm of breast: Secondary | ICD-10-CM

## 2015-06-10 ENCOUNTER — Encounter: Payer: Self-pay | Admitting: Obstetrics

## 2015-07-03 ENCOUNTER — Ambulatory Visit (INDEPENDENT_AMBULATORY_CARE_PROVIDER_SITE_OTHER): Payer: Medicaid Other | Admitting: Pulmonary Disease

## 2015-07-03 ENCOUNTER — Encounter: Payer: Self-pay | Admitting: Pulmonary Disease

## 2015-07-03 VITALS — BP 142/100 | HR 79 | Ht 62.0 in | Wt 207.0 lb

## 2015-07-03 DIAGNOSIS — Z9989 Dependence on other enabling machines and devices: Principal | ICD-10-CM

## 2015-07-03 DIAGNOSIS — J45998 Other asthma: Secondary | ICD-10-CM | POA: Diagnosis not present

## 2015-07-03 DIAGNOSIS — E662 Morbid (severe) obesity with alveolar hypoventilation: Secondary | ICD-10-CM | POA: Diagnosis not present

## 2015-07-03 DIAGNOSIS — J9611 Chronic respiratory failure with hypoxia: Secondary | ICD-10-CM | POA: Diagnosis not present

## 2015-07-03 DIAGNOSIS — IMO0002 Reserved for concepts with insufficient information to code with codable children: Secondary | ICD-10-CM

## 2015-07-03 DIAGNOSIS — G4733 Obstructive sleep apnea (adult) (pediatric): Secondary | ICD-10-CM | POA: Diagnosis not present

## 2015-07-03 NOTE — Patient Instructions (Signed)
Will get CPAP report Will arrange for overnight oxygen test with CPAP and 2 liters oxygen  Follow up in 6 months

## 2015-07-03 NOTE — Progress Notes (Signed)
Chief Complaint  Patient presents with  . Follow-up    Pt reports no change in SOB since last OV. Pt c/o increased headaches - started taking Genvoya x 1 month ago around the time the headaches started. Pt not sure if the new medication is causing the HA, the hot weather or her CPAP machine. BP has been high for a couple of days.    History of Present Illness: Leslie Gates is a 49 y.o. female with OSA, OHS, and asthma.  Her breathing has been doing better.  She is not having cough, wheeze, or sputum.  She does not need to use proair much.  She is not having any issues with CPAP mask fit.  She uses 2 liters oxygen with CPAP.  She was changed to genvoya about 1 month ago at ID clinic.  Since then she has been getting headaches.  TESTS: PSG 06/08/04 >> RDI 92.1, oxygen nadir 57% ONO on RA 08/03/13 >> 203 min with SpO2 < 88% Echo 08/22/13 >> EF 60 to 65%, PAS 29 mmHg Split night 12/22/13 >> AHI 27, SaO2 low 77%. CPAP 12 cm H2O >> AHI 5.9, +R, +S. Did not need supplemental oxygen. PFT 01/29/14 >> FEV1 1.79 (81%), FEV1% 86, TLC 3.47 (74%), DLCO 71% ONO with CPAP and 2 liters 02/13/14 >> Test time 8 hrs 36 min. Basal SpO2 98%, low SpO2 63% (artifact). Spent 0.4 min with SpO2 < 88%.  CPAP 02/13/13 to 02/12/14 >> Used on 340 of 365 nights with average 5 hrs 55 min. Average AHI 3.8 with CPAP 12 cm H2O. Echo 04/12/15 >> EF 65 to 70%, PAS 35 mmHg  PMHx >> diastolic CHF, HIV, HTN ,CAD  PSHx, Medications, Allergies, Fhx, Shx reviewed.  Physical Exam: BP 142/100 mmHg  Pulse 79  Ht  (1.575 m)  Wt 207 lb (93.895 kg)  BMI 37.85 kg/m2  SpO2 96%  General - No distress ENT - No sinus tenderness, no oral exudate, MP 4, scalloped tongue, no LAN Cardiac - s1s2 regular, no murmur Chest - No wheeze/rales/dullness Back - No focal tenderness Abd - Soft, non-tender Ext - ankle edema Neuro - Normal strength Skin - No rashes Psych - normal mood, and behavior   Assessment/Plan:  Dyspnea likely  related to asthma. Improved with inhaler therapy. Plan: - continue Qvar and prn proair.  Obstructive sleep apnea. She is compliant with CPAP and reports benefit. Plan: - continue CPAP 12 cm H2O  Chronic respiratory failure 2nd to obesity hypoventilation syndrome. Plan: - continue 2 liters oxygen at night with CPAP  Headaches. Discussion: She reports these started after she was started on genvoya.  She wants to make sure her sleep apnea isn't contributing to her headaches. Plan: - will get CPAP download and ONO with CPAP and 2 liters to make sure her OSA and OHS are controlled - advised her to d/w Dr. Drue Second with ID to determine if she needs to have genvoya changed   Coralyn Helling, MD [facility] Pager:  [phone]

## 2015-07-08 ENCOUNTER — Telehealth: Payer: Self-pay | Admitting: Pulmonary Disease

## 2015-07-08 MED ORDER — BECLOMETHASONE DIPROPIONATE 80 MCG/ACT IN AERS: 2.0000 | INHALATION_SPRAY | Freq: Two times a day (BID) | RESPIRATORY_TRACT | Status: DC

## 2015-07-08 NOTE — Telephone Encounter (Signed)
Qvar rx sent via escribe.   Spoke with Lowella Bandy with [facility]. Advised of above. She verbalized understanding and voiced no further questions or concerns at this time.

## 2015-07-29 ENCOUNTER — Other Ambulatory Visit: Payer: Self-pay | Admitting: *Deleted

## 2015-07-29 DIAGNOSIS — L299 Pruritus, unspecified: Secondary | ICD-10-CM

## 2015-07-29 MED ORDER — ONE-DAILY MULTI VITAMINS PO TABS: 1.0000 | ORAL_TABLET | Freq: Every day | ORAL | Status: DC

## 2015-07-29 MED ORDER — CETIRIZINE HCL 10 MG PO TABS: 10.0000 mg | ORAL_TABLET | Freq: Every day | ORAL | Status: DC

## 2015-08-04 ENCOUNTER — Telehealth: Payer: Self-pay | Admitting: Pulmonary Disease

## 2015-08-04 DIAGNOSIS — G4733 Obstructive sleep apnea (adult) (pediatric): Secondary | ICD-10-CM

## 2015-08-04 NOTE — Telephone Encounter (Signed)
ONO with CPAP and 2 liters 07/17/15 >> test time 10 hrs 48 min.  Basal SpO2 91.5%, low SpO2 52%.  Spent 131.5 min with SpO2 < 88%.   Will have my nurse inform pt that ONO showed oxygen level was still low even with CPAP and 2 liters oxygen.  Will call her with results of CPAP download and then determine what adjustments to her sleep apnea therapy are needed.  Please ensure that her DME sends a CPAP download.

## 2015-08-12 NOTE — Telephone Encounter (Signed)
LMOM TCB x1 Also need to verify that [facility] is her DME

## 2015-08-14 NOTE — Telephone Encounter (Signed)
lmtcb

## 2015-08-15 NOTE — Telephone Encounter (Signed)
Called and spoke to a woman that lives at residence. She stated that the pt was not at home and would have her return our call.

## 2015-08-16 NOTE — Telephone Encounter (Signed)
Pt is aware of results. Pt does uses [facility]. An order was sent in august to get a download. Will send message to [facility].  Will await response back

## 2015-08-16 NOTE — Telephone Encounter (Signed)
Per Efraim Kaufmann w/ [facility]:  We did get the order in August but can't find the d/l anywhere. We had to mail the patient a new SD card when this order came in. [place] investigating whether or not we received the card back from the patient.  ---  Will await her response back about download

## 2015-08-20 NOTE — Telephone Encounter (Signed)
Sent Melissa a message following up on this.

## 2015-08-26 NOTE — Telephone Encounter (Signed)
Sent staff message to [facility] to check on the status of this

## 2015-08-26 NOTE — Telephone Encounter (Signed)
Download received and placed in VS look at folder. Please advise thanks

## 2015-08-28 NOTE — Telephone Encounter (Signed)
CPAP 05/23/15 to 08/20/15 >> used on 89 of 90 nights with average 8 hrs and 9 min.  Average AHI is 7.9 with CPAP 12 cm H2O.  Will have my nurse inform pt that CPAP report shows good control of sleep apnea with current CPAP settings.    Please have her DME change her to 4 liters oxygen at night with CPAP.  Please arrange for ONO with 4 liters oxygen and CPAP.

## 2015-08-28 NOTE — Telephone Encounter (Signed)
Called and spoke with pt. Reviewed results and recs Pt voiced understanding and had no further questions Order for 4L oxygen at night with CPAP was placed  And ONO with 4L oxygen and CPAP was placed. Pt is aware that orders have been placed  Nothing further needed, will sign off on message

## 2015-09-09 ENCOUNTER — Other Ambulatory Visit: Payer: Medicaid Other

## 2015-09-09 DIAGNOSIS — B20 Human immunodeficiency virus [HIV] disease: Secondary | ICD-10-CM

## 2015-09-09 LAB — CBC WITH DIFFERENTIAL/PLATELET
BASOS ABS: 0 10*3/uL (ref 0.0–0.1)
Basophils Relative: 1 % (ref 0–1)
Eosinophils Absolute: 0.1 10*3/uL (ref 0.0–0.7)
Eosinophils Relative: 3 % (ref 0–5)
HEMATOCRIT: 37.8 % (ref 36.0–46.0)
HEMOGLOBIN: 12.4 g/dL (ref 12.0–15.0)
LYMPHS PCT: 46 % (ref 12–46)
Lymphs Abs: 1.8 10*3/uL (ref 0.7–4.0)
MCH: 29.7 pg (ref 26.0–34.0)
MCHC: 32.8 g/dL (ref 30.0–36.0)
MCV: 90.6 fL (ref 78.0–100.0)
MPV: 9.6 fL (ref 8.6–12.4)
Monocytes Absolute: 0.3 10*3/uL (ref 0.1–1.0)
Monocytes Relative: 8 % (ref 3–12)
NEUTROS ABS: 1.7 10*3/uL (ref 1.7–7.7)
Neutrophils Relative %: 42 % — ABNORMAL LOW (ref 43–77)
Platelets: 318 10*3/uL (ref 150–400)
RBC: 4.17 MIL/uL (ref 3.87–5.11)
RDW: 13.7 % (ref 11.5–15.5)
WBC: 4 10*3/uL (ref 4.0–10.5)

## 2015-09-09 LAB — COMPLETE METABOLIC PANEL WITH GFR
ALT: 14 U/L (ref 6–29)
AST: 16 U/L (ref 10–35)
Albumin: 4.1 g/dL (ref 3.6–5.1)
Alkaline Phosphatase: 61 U/L (ref 33–115)
BUN: 14 mg/dL (ref 7–25)
CALCIUM: 10 mg/dL (ref 8.6–10.2)
CHLORIDE: 100 mmol/L (ref 98–110)
CO2: 28 mmol/L (ref 20–31)
Creat: 0.69 mg/dL (ref 0.50–1.10)
GFR, Est Non African American: >89 mL/min (ref >=60)
Glucose, Bld: 196 mg/dL — ABNORMAL HIGH (ref 65–99)
POTASSIUM: 4.3 mmol/L (ref 3.5–5.3)
Sodium: 137 mmol/L (ref 135–146)
Total Bilirubin: 0.3 mg/dL (ref 0.2–1.2)
Total Protein: 7.8 g/dL (ref 6.1–8.1)

## 2015-09-10 ENCOUNTER — Encounter: Payer: Self-pay | Admitting: Pulmonary Disease

## 2015-09-10 LAB — T-HELPER CELL (CD4) - (RCID CLINIC ONLY)
CD4 % Helper T Cell: 26 % — ABNORMAL LOW (ref 33–55)
CD4 T Cell Abs: 470 /uL (ref 400–2700)

## 2015-09-11 ENCOUNTER — Telehealth: Payer: Self-pay | Admitting: *Deleted

## 2015-09-11 LAB — HIV-1 RNA QUANT-NO REFLEX-BLD
HIV 1 RNA Quant: <20 copies/mL (ref <20)
HIV-1 RNA Quant, Log: <1.3 Log copies/mL (ref <1.30)

## 2015-09-11 NOTE — Telephone Encounter (Signed)
Patient contacted the office to reschedule her colposcopy. Attempted to contact the patient and left message for patient to call the office.

## 2015-09-12 NOTE — Telephone Encounter (Signed)
Patient has been rescheduled for 09-13-15 for colposcopy.

## 2015-09-13 ENCOUNTER — Ambulatory Visit (INDEPENDENT_AMBULATORY_CARE_PROVIDER_SITE_OTHER): Payer: Medicaid Other | Admitting: Obstetrics

## 2015-09-13 ENCOUNTER — Other Ambulatory Visit: Payer: Self-pay | Admitting: Obstetrics

## 2015-09-13 ENCOUNTER — Encounter: Payer: Self-pay | Admitting: Obstetrics

## 2015-09-13 VITALS — BP 136/93 | HR 70 | Temp 98.1°F | Ht 63.0 in | Wt 211.0 lb

## 2015-09-13 DIAGNOSIS — N898 Other specified noninflammatory disorders of vagina: Secondary | ICD-10-CM

## 2015-09-13 DIAGNOSIS — Z01812 Encounter for preprocedural laboratory examination: Secondary | ICD-10-CM

## 2015-09-13 DIAGNOSIS — N871 Moderate cervical dysplasia: Secondary | ICD-10-CM | POA: Diagnosis not present

## 2015-09-13 DIAGNOSIS — Z3202 Encounter for pregnancy test, result negative: Secondary | ICD-10-CM

## 2015-09-13 LAB — POCT URINE PREGNANCY: PREG TEST UR: NEGATIVE

## 2015-09-13 NOTE — Progress Notes (Signed)
Colposcopy Procedure Note  Indications: Pap smear 1 months ago showed: low-grade squamous intraepithelial neoplasia (LGSIL - encompassing HPV,mild dysplasia,CIN I). The prior pap showed LGSIL with cells of HGSIL.  Prior cervical/vaginal disease: CIN 2. Prior cervical treatment: no treatment.  Procedure Details  The risks and benefits of the procedure and Written informed consent obtained.  A time-out was performed confirming the patient, procedure and allergy status  Speculum placed in vagina and excellent visualization of cervix achieved, cervix swabbed x 3 with acetic acid solution.  Findings: Cervix: no visible lesions, no mosaicism, no punctation and no abnormal vasculature; SCJ visualized 360 degrees without lesions, endocervical curettage performed, cervical biopsies taken at 6 and 12 o'clock, specimen labelled and sent to pathology and hemostasis achieved with silver nitrate.   Vaginal inspection: normal without visible lesions. Vulvar colposcopy: vulvar colposcopy not performed.   Physical Exam   Specimens: Cervical Biopsies and ECC  Complications: none.  Plan: Specimens labelled and sent to Pathology. Will base further treatment on Pathology findings. Treatment options discussed with patient. Post biopsy instructions given to patient. Return to discuss Pathology results in 2 weeks.

## 2015-09-16 ENCOUNTER — Encounter: Payer: Self-pay | Admitting: Internal Medicine

## 2015-09-16 ENCOUNTER — Ambulatory Visit (INDEPENDENT_AMBULATORY_CARE_PROVIDER_SITE_OTHER): Payer: Medicaid Other | Admitting: Internal Medicine

## 2015-09-16 VITALS — BP 148/89 | HR 73 | Temp 98.3°F | Wt 211.0 lb

## 2015-09-16 DIAGNOSIS — Z23 Encounter for immunization: Secondary | ICD-10-CM | POA: Diagnosis present

## 2015-09-16 DIAGNOSIS — I10 Essential (primary) hypertension: Secondary | ICD-10-CM

## 2015-09-16 DIAGNOSIS — R635 Abnormal weight gain: Secondary | ICD-10-CM | POA: Diagnosis not present

## 2015-09-16 DIAGNOSIS — R739 Hyperglycemia, unspecified: Secondary | ICD-10-CM | POA: Diagnosis present

## 2015-09-16 DIAGNOSIS — B20 Human immunodeficiency virus [HIV] disease: Secondary | ICD-10-CM | POA: Diagnosis not present

## 2015-09-16 LAB — HEMOGLOBIN A1C
HEMOGLOBIN A1C: 6.7 % — AB (ref <5.7)
MEAN PLASMA GLUCOSE: 146 mg/dL — AB (ref <117)

## 2015-09-16 MED ORDER — AMLODIPINE BESYLATE 10 MG PO TABS: 10.0000 mg | ORAL_TABLET | Freq: Every day | ORAL | Status: DC

## 2015-09-16 NOTE — Progress Notes (Signed)
Patient ID: Leslie LoreVanessa A Dilmore, female   DOB: Jan 09, 1966, 49 y.o.   MRN: [id number]       Patient ID: Leslie Gates, female   DOB: Jan 09, 1966, 49 y.o.   MRN: [id number]  HPI 49yo F with HIV disease, CD 4 count of 480/VL<20 (oct 2016) on genvoya. Patient doing well with taking meds, though she is still concerned about weight gain. Still not consistent with any dieting. No other complaints  Outpatient Encounter Prescriptions as of 09/16/2015  Medication Sig  . albuterol (PROAIR HFA) 108 (90 BASE) MCG/ACT inhaler Inhale 1-2 puffs into the lungs every 6 (six) hours as needed for wheezing or shortness of breath.  Marland Kitchen. amLODipine (NORVASC) 10 MG tablet Take 1 tablet (10 mg total) by mouth daily.  . beclomethasone (QVAR) 80 MCG/ACT inhaler Inhale 2 puffs into the lungs 2 (two) times daily.  . carbamide peroxide (DEBROX) 6.5 % otic solution Place 5 drops into both ears 2 (two) times daily.  . cetirizine (ZYRTEC) 10 MG tablet Take 1 tablet (10 mg total) by mouth daily.  Marland Kitchen. elvitegravir-cobicistat-emtricitabine-tenofovir (GENVOYA) 150-150-200-10 MG TABS tablet Take 1 tablet by mouth daily with breakfast.  . fluticasone (FLONASE) 50 MCG/ACT nasal spray Place 2 sprays into both nostrils daily.  . furosemide (LASIX) 20 MG tablet TAKE 1 TABLET BY MOUTH EVERY DAY  . loratadine (CLARITIN) 10 MG tablet Take 1 tablet (10 mg total) by mouth daily.  . Multiple Vitamin (MULTIVITAMIN) tablet Take 1 tablet by mouth daily.  . traMADol (ULTRAM) 50 MG tablet Take 1-2 tablets (50-100 mg total) by mouth every 6 (six) hours as needed (pain).   No facility-administered encounter medications on file as of 09/16/2015.     Patient Active Problem List   Diagnosis Date Noted  . Dyspnea 04/08/2015  . Excessive ear wax 03/01/2015  . Knee pain, acute 10/23/2014  . Moderate dysplasia of cervix 07/10/2013  . Chemical conjunctivitis 09/22/2012  . Angioedema of lips 09/09/2011  . Rash 08/12/2011  . Acute bronchitis 04/10/2011    . Obesity hypoventilation syndrome (HCC) 07/10/2010  . Obesity (BMI 30.0-34.9) 07/01/2010  . Obstructive sleep apnea 06/27/2010  . HYPERTENSION, PULMONARY 05/28/2010  . SHORTNESS OF BREATH 05/08/2010  . Allergic rhinitis 03/14/2008  . DYSPLASIA OF CERVIX UNSPECIFIED 12/19/2007  . HERPES ZOSTER 09/15/2007  . OTITIS MEDIA, CHRONIC 03/22/2007  . HYPERTENSION 03/22/2007  . HIV DISEASE 06/21/2002     Health Maintenance Due  Topic Date Due  . TETANUS/TDAP  07/05/1986  . INFLUENZA VACCINE  06/17/2015     Review of Systems + weight gain, ( + 6 lb since last visit). Seasonal allergies. Otherwise 10 point ros is negative Physical Exam  BP 148/89 mmHg  Pulse 73  Temp(Src) 98.3 F (36.8 C) (Oral)  Wt 211 lb (95.709 kg)  LMP 08/18/2015 Physical Exam  Constitutional:  oriented to person, place, and time. appears well-developed and well-nourished. No distress.  HENT: [place]/AT, PERRLA, no scleral icterus Mouth/Throat: Oropharynx is clear and moist. No oropharyngeal exudate.  Cardiovascular: Normal rate, regular rhythm and normal heart sounds. Exam reveals no gallop and no friction rub.  No murmur heard.  Pulmonary/Chest: Effort normal and breath sounds normal. No respiratory distress.  has no wheezes.  Neck = supple, no nuchal rigidity Abdominal: Soft. Bowel sounds are normal.  exhibits no distension. There is no tenderness.  Lymphadenopathy: no cervical adenopathy. No axillary adenopathy Neurological: alert and oriented to person, place, and time.  Skin: Skin is warm and dry. No rash noted. No  erythema.  Psychiatric: a normal mood and affect.  behavior is normal.     Lab Results  Component Value Date   CD4TCELL 26* 09/09/2015   Lab Results  Component Value Date   CD4TABS 470 09/09/2015   CD4TABS 430 04/25/2015   CD4TABS 410 09/05/2014   Lab Results  Component Value Date   HIV1RNAQUANT <20 09/09/2015   Lab Results  Component Value Date   HEPBSAB NEG 02/08/2009   No  results found for: RPR  CBC Lab Results  Component Value Date   WBC 4.0 09/09/2015   RBC 4.17 09/09/2015   HGB 12.4 09/09/2015   HCT 37.8 09/09/2015   PLT 318 09/09/2015   MCV 90.6 09/09/2015   MCH 29.7 09/09/2015   MCHC 32.8 09/09/2015   RDW 13.7 09/09/2015   LYMPHSABS 1.8 09/09/2015   MONOABS 0.3 09/09/2015   EOSABS 0.1 09/09/2015   BASOSABS 0.0 09/09/2015   BMET Lab Results  Component Value Date   NA 137 09/09/2015   K 4.3 09/09/2015   CL 100 09/09/2015   CO2 28 09/09/2015   GLUCOSE 196* 09/09/2015   BUN 14 09/09/2015   CREATININE 0.69 09/09/2015   CALCIUM 10.0 09/09/2015   GFRNONAA >89 09/09/2015   GFRAA >89 09/09/2015     Assessment and Plan  HIv disease= doing well with current regimen, continue with genvoya  Hyperglycemia = noted to be close to 200. Will check hemoglobin A1c. May have insulin resistance as she has had more increase in weight over the year  Weight gain = spent majority of visit counseling on diet modification  Health maintenance = will give flu shot  Hypertension = continue with current regimen. If still elevated at next appt, may need to change regimen

## 2015-09-18 ENCOUNTER — Other Ambulatory Visit: Payer: Self-pay | Admitting: Obstetrics

## 2015-09-18 DIAGNOSIS — B373 Candidiasis of vulva and vagina: Secondary | ICD-10-CM

## 2015-09-18 DIAGNOSIS — N760 Acute vaginitis: Principal | ICD-10-CM

## 2015-09-18 DIAGNOSIS — B9689 Other specified bacterial agents as the cause of diseases classified elsewhere: Secondary | ICD-10-CM

## 2015-09-18 DIAGNOSIS — B3731 Acute candidiasis of vulva and vagina: Secondary | ICD-10-CM

## 2015-09-18 LAB — SURESWAB, VAGINOSIS/VAGINITIS PLUS
Atopobium vaginae: 7.4 Log (cells/mL)
C. albicans, DNA: DETECTED — AB
C. glabrata, DNA: NOT DETECTED
C. parapsilosis, DNA: NOT DETECTED
C. trachomatis RNA, TMA: NOT DETECTED
C. tropicalis, DNA: NOT DETECTED
Gardnerella vaginalis: >8 Log (cells/mL)
LACTOBACILLUS SPECIES: NOT DETECTED Log (cells/mL)
MEGASPHAERA SPECIES: NOT DETECTED Log (cells/mL)
N. gonorrhoeae RNA, TMA: NOT DETECTED
T. vaginalis RNA, QL TMA: NOT DETECTED

## 2015-09-18 MED ORDER — FLUCONAZOLE 150 MG PO TABS: 150.0000 mg | ORAL_TABLET | Freq: Once | ORAL | Status: DC

## 2015-09-18 MED ORDER — METRONIDAZOLE 500 MG PO TABS: 500.0000 mg | ORAL_TABLET | Freq: Two times a day (BID) | ORAL | Status: DC

## 2015-09-25 ENCOUNTER — Other Ambulatory Visit: Payer: Self-pay | Admitting: Pulmonary Disease

## 2015-09-25 NOTE — Telephone Encounter (Signed)
Dr Craige CottaSood please advise if you wish to continue this patient's Lasix 20mg  Last filled 04/01/15 x 5 refills by VS No mention in last OV of continuing therapy Upcoming appt with VS - 10/26/2015

## 2015-10-02 ENCOUNTER — Telehealth: Payer: Self-pay | Admitting: Pulmonary Disease

## 2015-10-02 NOTE — Telephone Encounter (Signed)
Noted  

## 2015-10-02 NOTE — Telephone Encounter (Signed)
[facility] [place] [facility] Pt no showed for ONO on CPAP.   Will forward to Dr. Craige CottaSood as Lorain ChildesFYI.

## 2015-10-03 ENCOUNTER — Other Ambulatory Visit: Payer: Self-pay

## 2015-10-03 MED ORDER — ALBUTEROL SULFATE HFA 108 (90 BASE) MCG/ACT IN AERS: 1.0000 | INHALATION_SPRAY | Freq: Four times a day (QID) | RESPIRATORY_TRACT | Status: DC | PRN

## 2015-10-03 MED ORDER — FUROSEMIDE 20 MG PO TABS: 20.0000 mg | ORAL_TABLET | Freq: Every day | ORAL | Status: DC

## 2015-10-14 ENCOUNTER — Telehealth: Payer: Self-pay | Admitting: Pulmonary Disease

## 2015-10-14 NOTE — Telephone Encounter (Signed)
lmtcb x1 

## 2015-10-15 NOTE — Telephone Encounter (Signed)
LMTCB on named VM

## 2015-10-16 MED ORDER — BECLOMETHASONE DIPROPIONATE 80 MCG/ACT IN AERS: 2.0000 | INHALATION_SPRAY | Freq: Two times a day (BID) | RESPIRATORY_TRACT | Status: DC

## 2015-10-16 NOTE — Telephone Encounter (Signed)
Looking back in VS last OV note. Pt was to continue Qvar . This has been sent in. Nothing further was needed.

## 2015-10-22 ENCOUNTER — Ambulatory Visit (INDEPENDENT_AMBULATORY_CARE_PROVIDER_SITE_OTHER): Payer: Medicaid Other | Admitting: Family Medicine

## 2015-10-22 ENCOUNTER — Encounter: Payer: Self-pay | Admitting: Family Medicine

## 2015-10-22 VITALS — BP 136/86 | HR 78 | Temp 98.1°F | Ht 63.0 in | Wt 210.0 lb

## 2015-10-22 DIAGNOSIS — J209 Acute bronchitis, unspecified: Secondary | ICD-10-CM | POA: Diagnosis present

## 2015-10-22 DIAGNOSIS — J309 Allergic rhinitis, unspecified: Secondary | ICD-10-CM | POA: Diagnosis not present

## 2015-10-22 DIAGNOSIS — J029 Acute pharyngitis, unspecified: Secondary | ICD-10-CM

## 2015-10-22 LAB — POCT RAPID STREP A (OFFICE): Rapid Strep A Screen: NEGATIVE

## 2015-10-22 MED ORDER — FLUTICASONE PROPIONATE 50 MCG/ACT NA SUSP: 2.0000 | Freq: Every day | NASAL | Status: DC

## 2015-10-22 MED ORDER — AZITHROMYCIN 250 MG PO TABS
ORAL_TABLET | ORAL | Status: DC
Start: 2015-10-22 — End: 2016-06-02

## 2015-10-22 NOTE — Progress Notes (Signed)
Subjective: Leslie Gates is a 49 y.o. female with well controlled HIV, persistent asthma, OHS, OSA on CPAP here for cough.  She reports 14 days of runny nose, congestion, and cough associated with red eyes and sinus pressure. No fever but after initial improvement in symptoms she began having worsening chills and the cough became productive of green sputum. She has been using albuterol every day, more than usual for dyspnea. No wheezing, chest pain, measured fever. She has taken NyQuil, tea, and other OTC remedies. She denies leg swelling, orthopnea, chest pain, hemoptysis, weight loss. + sick contacts.   - Non-smoker   Objective: BP 136/86 mmHg  Pulse 78  Temp(Src) 98.1 F (36.7 C) (Oral)  Ht 5\' 3"  (1.6 m)  Wt 210 lb (95.255 kg)  BMI 37.21 kg/m2 98% on RA Gen:  49 y.o. female in no distress HEENT: MMM, TMs poorly visualized with cerumen bilaterally, no otitis externa. EOMI, PERRL with conjunctival injection on right. Edematous turbinates bilaterally. Oropharynx erythematous with tonsil enlargement without exudates. +Post nasal drip.  CV: RRR, no murmur, no JVD, no LE edema Resp: Non-labored, good air movement with scattered rhonchi, no wheezes noted Skin: No exanthem noted  Assessment & Plan: Leslie Gates is a 49 y.o. female with:  Acute bronchitis Worsening with long duration concerning for bacterial superinfection of initial viral illness in pt with baseline poor pulmonary function and HIV (though well controlled). Not hypoxemic. Nothing focal on exam to warrant CXR and no wheezing indicating need for steroids. Will Rx azithromycin and continue treatment of comorbidities with strict return criteria if no improvement by the end of the week.   Allergic rhinitis Element of this contributing to symptoms, Rx flonase. Continue po antihistamine.

## 2015-10-22 NOTE — Assessment & Plan Note (Signed)
Worsening with long duration concerning for bacterial superinfection of initial viral illness in pt with baseline poor pulmonary function and HIV (though well controlled). Not hypoxemic. Nothing focal on exam to warrant CXR and no wheezing indicating need for steroids. Will Rx azithromycin and continue treatment of comorbidities with strict return criteria if no improvement by the end of the week.

## 2015-10-22 NOTE — Assessment & Plan Note (Addendum)
Element of this contributing to symptoms, Rx flonase. Continue po antihistamine.

## 2015-10-22 NOTE — Patient Instructions (Addendum)
Thank you for coming in today!  As we discussed, you have a bacterial infection that probably started with a viral infection. Take Azithromycin Z pak - 2 tablets today, then 1 tablet for next 4 days. If you're not feeling better by the end of the week, please come back to be seen.  Continue other symptomatic treatments as some of your symptoms are also due to allergic rhinitis.   If symptoms do not improve or worsening with Chest pain, worsening shortness of breath (especially on activity or wake you up from sleep), swelling in legs, or other concerning symptoms please call clinic or go directly to ED for further evaluation  Our clinic's number is [phone]. Feel free to call any time with questions or concerns. We will answer any questions after hours with our 24-hour emergency line at that number as well.   - Dr. Jarvis NewcomerGrunz

## 2015-10-29 ENCOUNTER — Other Ambulatory Visit: Payer: Self-pay | Admitting: *Deleted

## 2015-10-29 DIAGNOSIS — L299 Pruritus, unspecified: Secondary | ICD-10-CM

## 2015-10-30 MED ORDER — ONE-DAILY MULTI VITAMINS PO TABS: 1.0000 | ORAL_TABLET | Freq: Every day | ORAL | Status: DC

## 2015-10-30 MED ORDER — CETIRIZINE HCL 10 MG PO TABS: 10.0000 mg | ORAL_TABLET | Freq: Every day | ORAL | Status: DC

## 2015-11-07 ENCOUNTER — Other Ambulatory Visit: Payer: Self-pay | Admitting: *Deleted

## 2015-11-07 DIAGNOSIS — J309 Allergic rhinitis, unspecified: Secondary | ICD-10-CM

## 2015-11-07 MED ORDER — FLUTICASONE PROPIONATE 50 MCG/ACT NA SUSP: 2.0000 | Freq: Every day | NASAL | Status: DC

## 2015-12-06 ENCOUNTER — Other Ambulatory Visit: Payer: Self-pay | Admitting: *Deleted

## 2015-12-06 DIAGNOSIS — J309 Allergic rhinitis, unspecified: Secondary | ICD-10-CM

## 2015-12-06 MED ORDER — FLUTICASONE PROPIONATE 50 MCG/ACT NA SUSP: 2.0000 | Freq: Every day | NASAL | Status: DC

## 2016-01-07 ENCOUNTER — Other Ambulatory Visit: Payer: Self-pay | Admitting: *Deleted

## 2016-01-07 DIAGNOSIS — J309 Allergic rhinitis, unspecified: Secondary | ICD-10-CM

## 2016-01-07 MED ORDER — FLUTICASONE PROPIONATE 50 MCG/ACT NA SUSP: 2.0000 | Freq: Every day | NASAL | Status: DC

## 2016-01-16 ENCOUNTER — Other Ambulatory Visit (HOSPITAL_COMMUNITY)
Admission: RE | Admit: 2016-01-16 | Discharge: 2016-01-16 | Disposition: A | Discharge status: Home / Self Care | Payer: Medicaid Other | Source: Ambulatory Visit | Attending: Internal Medicine | Admitting: Internal Medicine

## 2016-01-16 ENCOUNTER — Other Ambulatory Visit: Payer: Medicaid Other

## 2016-01-16 DIAGNOSIS — B20 Human immunodeficiency virus [HIV] disease: Secondary | ICD-10-CM

## 2016-01-16 DIAGNOSIS — Z113 Encounter for screening for infections with a predominantly sexual mode of transmission: Secondary | ICD-10-CM | POA: Diagnosis not present

## 2016-01-16 LAB — COMPLETE METABOLIC PANEL WITH GFR
ALBUMIN: 4.1 g/dL (ref 3.6–5.1)
ALK PHOS: 60 U/L (ref 33–115)
ALT: 16 U/L (ref 6–29)
AST: 21 U/L (ref 10–35)
BUN: 12 mg/dL (ref 7–25)
CO2: 28 mmol/L (ref 20–31)
Calcium: 9.7 mg/dL (ref 8.6–10.2)
Chloride: 101 mmol/L (ref 98–110)
Creat: 0.73 mg/dL (ref 0.50–1.10)
GFR, Est African American: >89 mL/min (ref >=60)
GFR, Est Non African American: >89 mL/min (ref >=60)
GLUCOSE: 119 mg/dL — AB (ref 65–99)
POTASSIUM: 4 mmol/L (ref 3.5–5.3)
SODIUM: 140 mmol/L (ref 135–146)
TOTAL PROTEIN: 8.1 g/dL (ref 6.1–8.1)
Total Bilirubin: 0.3 mg/dL (ref 0.2–1.2)

## 2016-01-16 LAB — CBC WITH DIFFERENTIAL/PLATELET
BASOS PCT: 1 % (ref 0–1)
Basophils Absolute: 0 10*3/uL (ref 0.0–0.1)
Eosinophils Absolute: 0.1 10*3/uL (ref 0.0–0.7)
Eosinophils Relative: 3 % (ref 0–5)
HCT: 38.6 % (ref 36.0–46.0)
HEMOGLOBIN: 12.6 g/dL (ref 12.0–15.0)
Lymphocytes Relative: 45 % (ref 12–46)
Lymphs Abs: 2 10*3/uL (ref 0.7–4.0)
MCH: 29.6 pg (ref 26.0–34.0)
MCHC: 32.6 g/dL (ref 30.0–36.0)
MCV: 90.6 fL (ref 78.0–100.0)
MPV: 9.5 fL (ref 8.6–12.4)
Monocytes Absolute: 0.3 10*3/uL (ref 0.1–1.0)
Monocytes Relative: 7 % (ref 3–12)
NEUTROS ABS: 2 10*3/uL (ref 1.7–7.7)
NEUTROS PCT: 44 % (ref 43–77)
Platelets: 346 10*3/uL (ref 150–400)
RBC: 4.26 MIL/uL (ref 3.87–5.11)
RDW: 13.9 % (ref 11.5–15.5)
WBC: 4.5 10*3/uL (ref 4.0–10.5)

## 2016-01-17 LAB — URINE CYTOLOGY ANCILLARY ONLY
Chlamydia: NEGATIVE
Neisseria Gonorrhea: NEGATIVE

## 2016-01-17 LAB — HIV-1 RNA QUANT-NO REFLEX-BLD: HIV 1 RNA Quant: <20 copies/mL (ref <20)

## 2016-01-17 LAB — T-HELPER CELL (CD4) - (RCID CLINIC ONLY)
CD4 % Helper T Cell: 27 % — ABNORMAL LOW (ref 33–55)
CD4 T CELL ABS: 500 /uL (ref 400–2700)

## 2016-01-30 ENCOUNTER — Encounter: Payer: Self-pay | Admitting: Internal Medicine

## 2016-01-30 ENCOUNTER — Ambulatory Visit (INDEPENDENT_AMBULATORY_CARE_PROVIDER_SITE_OTHER): Payer: Medicaid Other | Admitting: Internal Medicine

## 2016-01-30 VITALS — BP 132/85 | HR 85 | Temp 98.1°F | Wt 206.0 lb

## 2016-01-30 DIAGNOSIS — J111 Influenza due to unidentified influenza virus with other respiratory manifestations: Secondary | ICD-10-CM | POA: Diagnosis not present

## 2016-01-30 DIAGNOSIS — R7303 Prediabetes: Secondary | ICD-10-CM | POA: Diagnosis not present

## 2016-01-30 DIAGNOSIS — B20 Human immunodeficiency virus [HIV] disease: Secondary | ICD-10-CM | POA: Diagnosis not present

## 2016-01-30 DIAGNOSIS — R69 Illness, unspecified: Secondary | ICD-10-CM

## 2016-01-30 NOTE — Progress Notes (Signed)
Patient ID: Leslie Gates, female   DOB: 1966/04/19, 50 y.o.   MRN: [id number]       Patient ID: Leslie Gates, female   DOB: August 14, 1966, 50 y.o.   MRN: [id number]  HPI 50yo F with Cd 4 count of 500/VL<20 on genvoya. Doing well with adherence to hiv meds though has influenza like illness that started roughly 3 days ago and just now started to improve. She reports that she initially had myalgias, fever, nasal congestion and cough. Fevers have subsided. She has been taking OTC meds to treat symptoms. She feels slightly better today than yesterday  Outpatient Encounter Prescriptions as of 01/30/2016  Medication Sig  . albuterol (PROAIR HFA) 108 (90 BASE) MCG/ACT inhaler Inhale 1-2 puffs into the lungs every 6 (six) hours as needed for wheezing or shortness of breath.  Marland Kitchen amLODipine (NORVASC) 10 MG tablet Take 1 tablet (10 mg total) by mouth daily.  Marland Kitchen azithromycin (ZITHROMAX) 250 MG tablet Take  (2 tablets) on day one, then take  (1 tablet) daily x 4 days.  . beclomethasone (QVAR) 80 MCG/ACT inhaler Inhale 2 puffs into the lungs 2 (two) times daily.  . carbamide peroxide (DEBROX) 6.5 % otic solution Place 5 drops into both ears 2 (two) times daily.  . cetirizine (ZYRTEC) 10 MG tablet Take 1 tablet (10 mg total) by mouth daily.  Marland Kitchen elvitegravir-cobicistat-emtricitabine-tenofovir (GENVOYA) 150-150-200-10 MG TABS tablet Take 1 tablet by mouth daily with breakfast.  . fluticasone (FLONASE) 50 MCG/ACT nasal spray Place 2 sprays into both nostrils daily.  . furosemide (LASIX) 20 MG tablet Take 1 tablet (20 mg total) by mouth daily.  Marland Kitchen loratadine (CLARITIN) 10 MG tablet Take 1 tablet (10 mg total) by mouth daily.  . Multiple Vitamin (MULTIVITAMIN) tablet Take 1 tablet by mouth daily.  . traMADol (ULTRAM) 50 MG tablet Take 1-2 tablets (50-100 mg total) by mouth every 6 (six) hours as needed (pain).   No facility-administered encounter medications on file as of 01/30/2016.     Patient Active  Problem List   Diagnosis Date Noted  . Dyspnea 04/08/2015  . Excessive ear wax 03/01/2015  . Knee pain, acute 10/23/2014  . Moderate dysplasia of cervix 07/10/2013  . Chemical conjunctivitis 09/22/2012  . Angioedema of lips 09/09/2011  . Rash 08/12/2011  . Acute bronchitis 04/10/2011  . Obesity hypoventilation syndrome (HCC) 07/10/2010  . Obesity (BMI 30.0-34.9) 07/01/2010  . Obstructive sleep apnea 06/27/2010  . HYPERTENSION, PULMONARY 05/28/2010  . SHORTNESS OF BREATH 05/08/2010  . Allergic rhinitis 03/14/2008  . DYSPLASIA OF CERVIX UNSPECIFIED 12/19/2007  . HERPES ZOSTER 09/15/2007  . OTITIS MEDIA, CHRONIC 03/22/2007  . HYPERTENSION 03/22/2007  . HIV DISEASE 06/21/2002     Health Maintenance Due  Topic Date Due  . TETANUS/TDAP  07/05/1986     Review of Systems  Physical Exam   BP 132/85 mmHg  Pulse 85  Temp(Src) 98.1 F (36.7 C) (Oral)  Wt 206 lb (93.441 kg)  SpO2 94%  LMP 12/23/2015  Lab Results  Component Value Date   CD4TCELL 27* 01/16/2016   Lab Results  Component Value Date   CD4TABS 500 01/16/2016   CD4TABS 470 09/09/2015   CD4TABS 430 04/25/2015   Lab Results  Component Value Date   HIV1RNAQUANT <20 01/16/2016   Lab Results  Component Value Date   HEPBSAB NEG 02/08/2009   No results found for: RPR  CBC Lab Results  Component Value Date   WBC 4.5 01/16/2016   RBC 4.26 01/16/2016  HGB 12.6 01/16/2016   HCT 38.6 01/16/2016   PLT 346 01/16/2016   MCV 90.6 01/16/2016   MCH 29.6 01/16/2016   MCHC 32.6 01/16/2016   RDW 13.9 01/16/2016   LYMPHSABS 2.0 01/16/2016   MONOABS 0.3 01/16/2016   EOSABS 0.1 01/16/2016   BASOSABS 0.0 01/16/2016   BMET Lab Results  Component Value Date   NA 140 01/16/2016   K 4.0 01/16/2016   CL 101 01/16/2016   CO2 28 01/16/2016   GLUCOSE 119* 01/16/2016   BUN 12 01/16/2016   CREATININE 0.73 01/16/2016   CALCIUM 9.7 01/16/2016   GFRNONAA >89 01/16/2016   GFRAA >89 01/16/2016     Assessment and  Plan  hiv disease =well controlled, continue with current regimen.  Pre-diabetic = random glucose at 119 and hemoglobin a1c slightly elevated at 6.7, we will refer to nutrition counseling to see if can modify with diet changes  ILI = possible flu. Appears that she is improving. Currently on day 4 of symptoms thus no significant benefit with oseltamivir. Recommend to continue with supportive care, fluids, and rest

## 2016-02-04 ENCOUNTER — Other Ambulatory Visit: Payer: Self-pay | Admitting: *Deleted

## 2016-02-04 DIAGNOSIS — J309 Allergic rhinitis, unspecified: Secondary | ICD-10-CM

## 2016-02-04 DIAGNOSIS — L299 Pruritus, unspecified: Secondary | ICD-10-CM

## 2016-02-04 MED ORDER — ONE-DAILY MULTI VITAMINS PO TABS: 1.0000 | ORAL_TABLET | Freq: Every day | ORAL | Status: DC

## 2016-02-04 MED ORDER — CETIRIZINE HCL 10 MG PO TABS: 10.0000 mg | ORAL_TABLET | Freq: Every day | ORAL | Status: DC

## 2016-02-04 MED ORDER — FLUTICASONE PROPIONATE 50 MCG/ACT NA SUSP: 2.0000 | Freq: Every day | NASAL | Status: DC

## 2016-02-19 ENCOUNTER — Ambulatory Visit (INDEPENDENT_AMBULATORY_CARE_PROVIDER_SITE_OTHER): Payer: Medicaid Other | Admitting: Obstetrics and Gynecology

## 2016-02-19 VITALS — BP 120/77 | HR 75 | Temp 98.2°F | Wt 208.8 lb

## 2016-02-19 DIAGNOSIS — R1114 Bilious vomiting: Secondary | ICD-10-CM | POA: Diagnosis not present

## 2016-02-19 DIAGNOSIS — K529 Noninfective gastroenteritis and colitis, unspecified: Secondary | ICD-10-CM

## 2016-02-19 DIAGNOSIS — R197 Diarrhea, unspecified: Secondary | ICD-10-CM

## 2016-02-19 LAB — HEMOCCULT GUIAC POC 1CARD (OFFICE): FECAL OCCULT BLD: NEGATIVE

## 2016-02-19 MED ORDER — OMEPRAZOLE 40 MG PO CPDR: 40.0000 mg | DELAYED_RELEASE_CAPSULE | Freq: Every day | ORAL | Status: DC

## 2016-02-19 NOTE — Patient Instructions (Signed)

## 2016-02-19 NOTE — Progress Notes (Signed)
Subjective:   Patient ID: Leslie Gates, female    DOB: 24-Jan-1966, 50 y.o.   MRN: [id number]  Patient presents for Same Day Appointment  Chief Complaint  Patient presents with  . Diarrhea    x 3 days  . Emesis    x 3 days   HPI: VOMITING AND DIARRHEA Vomiting began 3 days ago. And was just on the first day with multiple episodes. Emesis was green in color Progression: emesis is better but also developed diarrhea Number of times vomited in last day: none, last vomited the first day (3 days ago). Is only having nausea and dry heaves now Medications tried: tried imodium, no improvement Recent travel: no Recent sick contacts: no Ingested suspicious foods: unknown, does have h/o diarrhea with certain foods Immunocompromised: HIV Stool coming out black - diarrhea Having to wear pamper Cough and stool just comes out Cant control bowel; eat or drink anything just comes out Never had black stools before No blood thinners Greasy foods give diarrhea -- has stool issues at baseline; "sensitive" Appetite - not able to drink or eat really well; feels like she will throw it up No sick contacts No recent hospitalization   Symptoms Diarrhea: yes Abdominal pain: achy pain Blood in vomit:no Weight loss: no Decreased urine output: yes Lightheadedness: yes Fever: no Bloody stools: no  ROS see HPI Smoking Status noted PMH significant for HIV  Past medical history, surgical, family, and social history reviewed and updated in the EMR as appropriate.  Objective:  BP 120/77 mmHg  Pulse 75  Temp(Src) 98.2 F (36.8 C) (Oral)  Wt 208 lb 12.8 oz (94.711 kg)  LMP 12/23/2015 Vitals and nursing note reviewed  Physical Exam  Constitutional: She is well-developed, well-nourished, and in no distress.  HENT:  Mouth/Throat: Mucous membranes are dry.  Cardiovascular: Normal rate.   Pulmonary/Chest: Effort normal.  Abdominal: Soft. Bowel sounds are hypoactive. There is tenderness in the  right lower quadrant and epigastric area. There is rebound. There is no rigidity and no guarding.  Genitourinary: Rectum normal. Guaiac negative stool.  Rectal tone normal. No gross blood on DRE. No hemorrhoids or masses palpated.    Results for orders placed or performed in visit on 02/19/16 (from the past 48 hour(s))  Hemoccult - 1 Card (office)     Status: Normal   Collection Time: 02/19/16 12:02 PM  Result Value Ref Range   Fecal Occult Blood, POC Negative Negative   Card #1 Date 02/19/16    Card #2 Fecal Occult Blod, POC     Card #2 Date     Card #3 Fecal Occult Blood, POC     Card #3 Date      Assessment & Plan:  1. Gastroenteritis Compilation of symptoms most consistent with gastroenteritis. She is afebrile in clinic and well-appearing. Does show signs of dehydration so encouraged continued hydration. Vomiting has resided but still have nausea will send in Rx for antiemetic to help patient tolerate PO. Dark diarrhea was concerning for possible upper GI bleed. FOBT negative on exam today. DRE with good rectal tone. Since diarrhea is recurring in patient discussed with her possible diagnosis of IBS. Will leave for her PCP to work-up after this acute infection. No concern for opportunistic infection causing this episode at this time in patient with HIV. CD4 count and viral load reviewed and appropriate. Patient compliant with medications. Some concern for PUD in patient with reflux and epigastric pain. Rx for PPI sent as well. Return  precautions discussed. Handout given. Patient to return if symptoms not improved in the next week or if the get worse.   Meds ordered this encounter  Medications  . omeprazole (PRILOSEC) 40 MG capsule    Sig: Take 1 capsule (40 mg total) by mouth daily.    Dispense:  30 capsule    Refill:  0  . ondansetron (ZOFRAN) 4 MG tablet    Sig: Take 1 tablet (4 mg total) by mouth every 8 (eight) hours as needed for nausea or vomiting.    Dispense:  20 tablet     Refill:  0     Caryl AdaJazma Phelps, DO 02/19/2016, 10:02 AM PGY-2, [facility]

## 2016-02-20 ENCOUNTER — Encounter: Payer: Self-pay | Admitting: Obstetrics and Gynecology

## 2016-02-20 MED ORDER — ONDANSETRON HCL 4 MG PO TABS: 4.0000 mg | ORAL_TABLET | Freq: Three times a day (TID) | ORAL | Status: DC | PRN

## 2016-02-26 ENCOUNTER — Ambulatory Visit: Payer: Medicaid Other | Admitting: Pulmonary Disease

## 2016-03-10 ENCOUNTER — Other Ambulatory Visit: Payer: Self-pay | Admitting: *Deleted

## 2016-03-10 DIAGNOSIS — J309 Allergic rhinitis, unspecified: Secondary | ICD-10-CM

## 2016-03-10 MED ORDER — FLUTICASONE PROPIONATE 50 MCG/ACT NA SUSP: 2.0000 | Freq: Every day | NASAL | Status: DC

## 2016-03-11 ENCOUNTER — Other Ambulatory Visit: Payer: Self-pay | Admitting: Obstetrics and Gynecology

## 2016-03-12 NOTE — Telephone Encounter (Signed)
Rx filled.  Leslie Gates M. Leslie RalphParker, MD [facility] Resident PGY-2 03/12/2016 8:17 AM

## 2016-04-01 ENCOUNTER — Other Ambulatory Visit: Payer: Self-pay | Admitting: Obstetrics and Gynecology

## 2016-04-01 ENCOUNTER — Other Ambulatory Visit: Payer: Self-pay | Admitting: Family Medicine

## 2016-04-16 ENCOUNTER — Ambulatory Visit (INDEPENDENT_AMBULATORY_CARE_PROVIDER_SITE_OTHER): Payer: Medicaid Other | Admitting: Family Medicine

## 2016-04-16 ENCOUNTER — Encounter: Payer: Self-pay | Admitting: Family Medicine

## 2016-04-16 VITALS — BP 122/78 | HR 64 | Temp 98.3°F | Ht 63.0 in | Wt 209.4 lb

## 2016-04-16 DIAGNOSIS — R197 Diarrhea, unspecified: Secondary | ICD-10-CM | POA: Insufficient documentation

## 2016-04-16 DIAGNOSIS — E119 Type 2 diabetes mellitus without complications: Secondary | ICD-10-CM | POA: Diagnosis not present

## 2016-04-16 DIAGNOSIS — R7303 Prediabetes: Secondary | ICD-10-CM | POA: Insufficient documentation

## 2016-04-16 MED ORDER — LACTASE 3000 UNITS PO TABS: 3000.0000 [IU] | ORAL_TABLET | Freq: Three times a day (TID) | ORAL | Status: DC

## 2016-04-16 NOTE — Assessment & Plan Note (Signed)
New diagnosis for the patient- it looks like she had an A1c of 6.7 in the past however denied ever being told she was diabetic and has never been counseled.  Has significant room for dietary changes as most of her foods include pastas, bread, rice, and "a lot of soda."  - discussed dietary changes today - referred to Dr. Gerilyn PilgrimSykes with nutrition  - held off on A1c given she has not had any counseling, will repeat A1c next month to get a better baseline.

## 2016-04-16 NOTE — Patient Instructions (Addendum)
Start keeping track of your bowel movements, everytime you have diarrhea, write it in a diary and what foods you ate within the 12 hours before that.  Start taking Lactaid up to 3 times a day prior to meals if they are going to have lactose in them (cheese, yogurt, milk).   From labs at your other doctor's office, you meet criteria for diabetes: try to cut back on simple carbohydrates. Our major sources of simple sugar are: Soft drinks, baked goods/sweets, pastas, breads, cereals, rice, potatoes  Call Dr. Gerilyn PilgrimSykes to schedule a nutrition appointment

## 2016-04-16 NOTE — Progress Notes (Signed)
Subjective: CC: meet new doctor, stomach issues HPI: Patient is a 50 y.o. female with a past medical history of HIV, HTN presenting to clinic today to meet her new doctor and discuss stomach issues.  She's had issues with diarrhea and flatulence for years. If she drinks a lot of water she becomes bloated. Beans may her gassy.  Ice cream, mayo, cheese, oranges, eggs all make her have diarrhea. Stools are watery and brown.   She has abdominal pain with diarrhea, some relief but not improved completely. She denies constipation. She sometimes has normal stools. She sometimes has dark stools, but never bright red. No fevers. She has tired imodium which did not help.   At her last visit she was found to be guaiac negative. There was a thought of gastroenteritis, but also IBS and PUD on the DDx.  She was prescribed omeprazole but never filled it. She's curious if her HIV meds could be causing this. No weight loss  Social History: accompanied by her "baby daddy" today. Lives with him and her son. Never smoker  Health Maintenance: due for TDap, declines today  ROS: All other systems reviewed and are negative besides that in HPI and patient continues to gain weight, notes that her ID doctor told her she was "boderline diabetic"  Past Medical History Patient Active Problem List   Diagnosis Date Noted  . Diabetes (HCC) 04/16/2016  . Diarrhea 04/16/2016  . Dyspnea 04/08/2015  . Excessive ear wax 03/01/2015  . Knee pain, acute 10/23/2014  . Moderate dysplasia of cervix 07/10/2013  . Chemical conjunctivitis 09/22/2012  . Angioedema of lips 09/09/2011  . Rash 08/12/2011  . Acute bronchitis 04/10/2011  . Obesity hypoventilation syndrome (HCC) 07/10/2010  . Obesity (BMI 30.0-34.9) 07/01/2010  . Obstructive sleep apnea 06/27/2010  . HYPERTENSION, PULMONARY 05/28/2010  . SHORTNESS OF BREATH 05/08/2010  . Allergic rhinitis 03/14/2008  . DYSPLASIA OF CERVIX UNSPECIFIED 12/19/2007  . HERPES ZOSTER  09/15/2007  . OTITIS MEDIA, CHRONIC 03/22/2007  . HYPERTENSION 03/22/2007  . HIV DISEASE 06/21/2002    Medications- reviewed and updated Current Outpatient Prescriptions  Medication Sig Dispense Refill  . albuterol (PROAIR HFA) 108 (90 BASE) MCG/ACT inhaler Inhale 1-2 puffs into the lungs every 6 (six) hours as needed for wheezing or shortness of breath. 8.5 each 5  . amLODipine (NORVASC) 10 MG tablet Take 1 tablet (10 mg total) by mouth daily. 30 tablet 11  . azithromycin (ZITHROMAX) 250 MG tablet Take  (2 tablets) on day one, then take  (1 tablet) daily x 4 days. 6 tablet 0  . beclomethasone (QVAR) 80 MCG/ACT inhaler Inhale 2 puffs into the lungs 2 (two) times daily. 1 Inhaler 5  . carbamide peroxide (DEBROX) 6.5 % otic solution Place 5 drops into both ears 2 (two) times daily. 15 mL 0  . cetirizine (ZYRTEC) 10 MG tablet Take 1 tablet (10 mg total) by mouth daily. 30 tablet 2  . elvitegravir-cobicistat-emtricitabine-tenofovir (GENVOYA) 150-150-200-10 MG TABS tablet Take 1 tablet by mouth daily with breakfast. 30 tablet 11  . fluticasone (FLONASE) 50 MCG/ACT nasal spray Place 2 sprays into both nostrils daily. 16 g 0  . furosemide (LASIX) 20 MG tablet Take 1 tablet (20 mg total) by mouth daily. 30 tablet 5  . lactase (LACTAID) 3000 units tablet Take 1 tablet (3,000 Units total) by mouth 3 (three) times daily with meals. If eating dairy 90 tablet 0  . loratadine (CLARITIN) 10 MG tablet Take 1 tablet (10 mg total) by  mouth daily. 30 tablet 4  . Multiple Vitamin (MULTIVITAMIN) tablet Take 1 tablet by mouth daily. 30 tablet 2  . omeprazole (PRILOSEC) 40 MG capsule TAKE ONE CAPSULE BY MOUTH DAILY 30 capsule 3  . ondansetron (ZOFRAN) 4 MG tablet TAKE ONE TABLET BY MOUTH EVERY 8 HOURS AS NEEDED FOR NAUSEA/VOMITING 20 tablet 0  . traMADol (ULTRAM) 50 MG tablet Take 1-2 tablets (50-100 mg total) by mouth every 6 (six) hours as needed (pain). 45 tablet 0   No current facility-administered  medications for this visit.    Objective: Office vital signs reviewed. BP 122/78 mmHg  Pulse 64  Temp(Src) 98.3 F (36.8 C) (Oral)  Ht 5\' 3"  (1.6 m)  Wt 209 lb 6.4 oz (94.983 kg)  BMI 37.10 kg/m2  LMP 04/09/2016   Physical Examination:  General: Awake, alert, well- nourished, NAD ENMT:   MMM, Oropharynx clear without erythema or tonsillar exudate/hypertrophy Cardio: RRR, no m/r/g noted.  Pulm: No increased WOB.  CTAB, without wheezes, rhonchi or crackles noted.  GI: soft, NT/ND,+BS x4, no hepatomegaly, no splenomegaly  Assessment/Plan: Diabetes (HCC) New diagnosis for the patient- it looks like she had an A1c of 6.7 in the past however denied ever being told she was diabetic and has never been counseled.  Has significant room for dietary changes as most of her foods include pastas, bread, rice, and "a lot of soda."  - discussed dietary changes today - referred to Dr. Gerilyn PilgrimSykes with nutrition  - held off on A1c given she has not had any counseling, will repeat A1c next month to get a better baseline.   Diarrhea Sounds like this could be food related, therefore considering lactose intolerance. Discussed avoiding diary, patient notes that it would be "impossible." Will start on a trial of lactaid when she eats diary products with her meals.  Discussed the importance of keeping a diary of when she has diarrhea and what foods she had eaten that day. DDx could also include medication side effects such as then Genvoya, diarrhea predominate IBS, or less likely IBD (could consider looking for inflammatory markers in the future). Patient to f/u in 1 month    Orders Placed This Encounter  Procedures  . Amb ref to Medical Nutrition Therapy-MNT    Referral Type:  Consultation    Requested Specialty:  Nutrition    Number of Visits Requested:  1    Meds ordered this encounter  Medications  . lactase (LACTAID) 3000 units tablet    Sig: Take 1 tablet (3,000 Units total) by mouth 3 (three)  times daily with meals. If eating dairy    Dispense:  90 tablet    Refill:  0    Joanna Puffrystal S. Dorsey PGY-2, [facility]

## 2016-04-16 NOTE — Assessment & Plan Note (Signed)
Sounds like this could be food related, therefore considering lactose intolerance. Discussed avoiding diary, patient notes that it would be "impossible." Will start on a trial of lactaid when she eats diary products with her meals.  Discussed the importance of keeping a diary of when she has diarrhea and what foods she had eaten that day. DDx could also include medication side effects such as then Genvoya, diarrhea predominate IBS, or less likely IBD (could consider looking for inflammatory markers in the future). Patient to f/u in 1 month

## 2016-05-13 ENCOUNTER — Other Ambulatory Visit: Payer: Self-pay | Admitting: Internal Medicine

## 2016-05-13 ENCOUNTER — Other Ambulatory Visit: Payer: Self-pay | Admitting: Family Medicine

## 2016-05-28 ENCOUNTER — Ambulatory Visit: Payer: Medicaid Other | Admitting: Family Medicine

## 2016-06-02 ENCOUNTER — Encounter: Payer: Self-pay | Admitting: Pulmonary Disease

## 2016-06-02 ENCOUNTER — Ambulatory Visit (INDEPENDENT_AMBULATORY_CARE_PROVIDER_SITE_OTHER): Payer: Medicaid Other | Admitting: Pulmonary Disease

## 2016-06-02 VITALS — BP 132/84 | HR 73 | Ht 63.0 in | Wt 213.6 lb

## 2016-06-02 DIAGNOSIS — E662 Morbid (severe) obesity with alveolar hypoventilation: Secondary | ICD-10-CM | POA: Diagnosis not present

## 2016-06-02 DIAGNOSIS — J45998 Other asthma: Secondary | ICD-10-CM

## 2016-06-02 DIAGNOSIS — IMO0002 Reserved for concepts with insufficient information to code with codable children: Secondary | ICD-10-CM

## 2016-06-02 DIAGNOSIS — J9611 Chronic respiratory failure with hypoxia: Secondary | ICD-10-CM | POA: Diagnosis not present

## 2016-06-02 DIAGNOSIS — G4733 Obstructive sleep apnea (adult) (pediatric): Secondary | ICD-10-CM | POA: Diagnosis not present

## 2016-06-02 DIAGNOSIS — Z9989 Dependence on other enabling machines and devices: Principal | ICD-10-CM

## 2016-06-02 MED ORDER — FLUTICASONE FUROATE-VILANTEROL 100-25 MCG/INH IN AEPB: 1.0000 | INHALATION_SPRAY | Freq: Every day | RESPIRATORY_TRACT | Status: DC

## 2016-06-02 NOTE — Progress Notes (Signed)
Current Outpatient Prescriptions on File Prior to Visit  Medication Sig  . albuterol (PROAIR HFA) 108 (90 BASE) MCG/ACT inhaler Inhale 1-2 puffs into the lungs every 6 (six) hours as needed for wheezing or shortness of breath.  Marland Kitchen. amLODipine (NORVASC) 10 MG tablet Take 1 tablet (10 mg total) by mouth daily.  . carbamide peroxide (DEBROX) 6.5 % otic solution Place 5 drops into both ears 2 (two) times daily.  . cetirizine (ZYRTEC) 10 MG tablet TAKE 1 TABLET BY MOUTH DAILY  . fluticasone (FLONASE) 50 MCG/ACT nasal spray Place 2 sprays into both nostrils daily.  . furosemide (LASIX) 20 MG tablet Take 1 tablet (20 mg total) by mouth daily.  . GENVOYA 150-150-200-10 MG TABS tablet TAKE 1 TABLET BY MOUTH EVERY DAY WITH BREAKFAST  . lactase (LACTAID) 3000 units tablet Take 1 tablet (3,000 Units total) by mouth 3 (three) times daily with meals. If eating dairy  . loratadine (CLARITIN) 10 MG tablet Take 1 tablet (10 mg total) by mouth daily.  . Multiple Vitamin (MULTI-VITAMINS) TABS TAKE 1 TABLET BY MOUTH DAILY  . omeprazole (PRILOSEC) 40 MG capsule TAKE ONE CAPSULE BY MOUTH DAILY  . traMADol (ULTRAM) 50 MG tablet Take 1-2 tablets (50-100 mg total) by mouth every 6 (six) hours as needed (pain).  . ondansetron (ZOFRAN) 4 MG tablet TAKE ONE TABLET BY MOUTH EVERY 8 HOURS AS NEEDED FOR NAUSEA/VOMITING (Patient not taking: Reported on 06/02/2016)   No current facility-administered medications on file prior to visit.    Chief Complaint  Patient presents with  . Follow-up    OSA - doing well.  wearing her CPAP machine every night x5-6 hours.  denies any mask, pressure issues    Sleep tests PSG 06/08/04 >> RDI 92.1, oxygen nadir 57% ONO on RA 08/03/13 >> 203 min with SpO2 < 88% Split night 12/22/13 >> AHI 27, SaO2 low 77%. CPAP 12 cm H2O >> AHI 5.9, +R, +S. Did not need supplemental oxygen. ONO with CPAP and 2 liters 02/13/14 >> Test time 8 hrs 36 min. Basal SpO2 98%, low SpO2 63% (artifact). Spent 0.4 min  with SpO2 < 88%.  CPAP 02/13/13 to 02/12/14 >> Used on 340 of 365 nights with average 5 hrs 55 min. Average AHI 3.8 with CPAP 12 cm H2O  Pulmonary tests PFT 01/29/14 >> FEV1 1.79 (81%), FEV1% 86, TLC 3.47 (74%), DLCO 71% PFT 04/29/15 >> FEV1 1.53 (66%), FEV1% 80, TLC 3.74 (76%), DLCO 46%  Cardiac tests Echo 04/12/15 >> EF 65 to 70%, PAS 35 mmHg  Past medical history Diastolic CHF, HIV, HTN ,CAD  Past surgical history, Family history, Social history, Allergies reviewed.  Vital signs BP 132/84 mmHg  Pulse 73  Ht 5\' 3"  (1.6 m)  Wt 213 lb 9.6 oz (96.888 kg)  BMI 37.85 kg/m2  SpO2 96%   History of Present Illness: Leslie Gates is a 50 y.o. female with OSA, OHS, and asthma.  She has notice more cough.  She has been using proair more >> this helps.  She brings up clear mucus.  She denies fever, chest pain, or wheeze.  She has sinus congestion and has been using nose sprays.  She uses CPAP w/o difficulty and this helps.  She gets about 6 hours sleep per night and feels rested.   Physical Exam:  General - No distress ENT - No sinus tenderness, no oral exudate, MP 4, scalloped tongue, 3+ tonsils, no LAN Cardiac - s1s2 regular, no murmur Chest - No wheeze/rales/dullness  Back - No focal tenderness Abd - Soft, non-tender Ext - ankle edema Neuro - Normal strength Skin - No rashes Psych - normal mood, and behavior   Assessment/Plan:  Dyspnea likely related to asthma. - she has increased cough - will change from Qvar to [facility] - continue prn proair  Obstructive sleep apnea. - She is compliant with CPAP and reports benefit. - continue CPAP 12 cm H2O  Chronic respiratory failure 2nd to obesity hypoventilation syndrome. - continue 2 liters oxygen at night with CPAP   Patient Instructions  Breo one puff daily >> rinse mouth after each use  Stop using Qvar  Follow up in 6 months     Coralyn Helling, MD [place] [facility] Pager:   [phone] 06/02/2016, 11:43 AM

## 2016-06-02 NOTE — Patient Instructions (Signed)
Breo one puff daily >> rinse mouth after each use  Stop using Qvar  Follow up in 6 months

## 2016-06-04 ENCOUNTER — Ambulatory Visit: Payer: Medicaid Other | Admitting: Family Medicine

## 2016-06-04 ENCOUNTER — Telehealth: Payer: Self-pay | Admitting: Pulmonary Disease

## 2016-06-04 NOTE — Telephone Encounter (Signed)
Leslie [place] [facility]. Pt must try and fail Advair, Dulera and Symbicort first.  VS - please advise. Thanks.

## 2016-06-05 MED ORDER — BUDESONIDE-FORMOTEROL FUMARATE 160-4.5 MCG/ACT IN AERO: 2.0000 | INHALATION_SPRAY | Freq: Two times a day (BID) | RESPIRATORY_TRACT | Status: DC

## 2016-06-05 NOTE — Telephone Encounter (Signed)
symbicort 160 has been sent in to the pharmacy to replace the breo.  Nothing further is needed.

## 2016-06-05 NOTE — Telephone Encounter (Signed)
Please change to symbicort 160 two puffs bid, #1 inhaler with 5 refills.

## 2016-06-11 ENCOUNTER — Ambulatory Visit (INDEPENDENT_AMBULATORY_CARE_PROVIDER_SITE_OTHER): Payer: Medicaid Other | Admitting: Internal Medicine

## 2016-06-11 ENCOUNTER — Encounter: Payer: Self-pay | Admitting: Family Medicine

## 2016-06-11 VITALS — Ht 63.0 in | Wt 211.2 lb

## 2016-06-11 DIAGNOSIS — E669 Obesity, unspecified: Secondary | ICD-10-CM | POA: Diagnosis present

## 2016-06-11 DIAGNOSIS — E119 Type 2 diabetes mellitus without complications: Secondary | ICD-10-CM

## 2016-06-11 NOTE — Progress Notes (Signed)
Nutrition Clinic:   Patient would like to learn how to eat healthier and learn healthy habits.   Usual eating pattern includes 2-3 meals and 3 snacks per day. Frequent foods and beverages include: chicken, stake, fish, rice, potatoes, beans, water, no tea, no koolaid Usual physical activity includes: rare  24-hr recall: (Up at 8:30 AM) B (10 AM)-  Cooked grits (16oz) and 2 eggs; water Snk (1PM)-  1.5 cups grapefruit (jarred), banana, apple pie (debbies package)   L ( PM)- ---  Snk ( PM)- ---  D (6:30 PM)- frozen shrimp scampi with butter garlic sauce;  corn, 4 potatoes, and 4 carrots (steamed)- 3 cups; water; soda 12oz-pepsi regular  Snk ( PM)- ---  Typical day? Yes.     Eats mostly steamed or baked foods Reports she has unhealthy options for snacks (for example fast food or candy or chips)   [name] Foods: twice a month Vegetables: three times a week Soda: rare Sweets: everyday- candy  Reports she tries to "make up" for missed meals and eat more   Handouts given during visit include:  AVS:  - discussed appropriate portions of starchy foods with her new diagnosis of DM - Discussed eating regularly throughout the day: patient goal- to eat three "real" meals a day; discussed the definition of "real" meals  - Discussed healthy snack options.  - Patient also would like to set goal for daily physical activity: would like to walk on treadmill daily starting with 10 mins daily  Goals: 1. eat three "real" meals a day  2. walk on treadmill daily (at least 10 minutes): record number of minutes   Follow up in Nutrition Clinic on August 17th @ 10:30AM  Make appointment in Pharmacy Clinic with Dr. Raymondo Band to obtain glucometer and learn to check cbgs  Demonstrated degree of understanding via:  Teach Back    Spent more than 50% of 60 minute appointment in face to face counseling.

## 2016-06-11 NOTE — Patient Instructions (Addendum)
   Good Snack Options: Fruit, Yogurt, vegetables, cheese (limit to one ounce per serving)  A "real meal" is a meal with protein, vegetables/fruit, and  starch    Follow up in Nutrition Clinic on August 17th at 10:30AM (PLEASE SCHEDULE THIS WHEN YOU CHECK OUT)   Goals: 1. eat three "real" meals a day  2. walk on treadmill daily (at least 10 minutes): record number of minutes    Diet Recommendations for Diabetes:  Starchy (carb) foods include: Bread, rice, pasta, potatoes, corn, crackers, bagels, muffins, all baked goods.  (Fruits, milk, and yogurt also have carbohydrate, but most of these foods will not spike your blood sugar as the starchy foods will.)  A few fruits do cause high blood sugars; use small portions of bananas (limit to 1/2 at a time), grapes, and most tropical fruits.    Protein foods include: Meat, fish, poultry, eggs, dairy foods, and beans such as pinto and kidney beans (beans also provide carbohydrate).   1. Eat at least 3 meals and 1-2 snacks per day. Never go more than 4-5 hours while awake without eating.  2. Limit starchy foods to TWO per meal and ONE per snack. ONE portion of a starchy  food is equal to the following:   - ONE slice of bread (or its equivalent, such as half of a hamburger bun).   - 1/2 cup of a "scoopable" starchy food such as potatoes or rice.   - 15 grams of carbohydrate as shown on food label.  3. Both lunch and dinner should include a protein food, a carb food, and vegetables.   - Obtain twice as many veg's as protein or carbohydrate foods for both lunch and dinner.   - Fresh or frozen veg's are best.   - Try to keep frozen veg's on hand for a quick vegetable serving.    4. Breakfast should always include protein.

## 2016-06-17 ENCOUNTER — Other Ambulatory Visit: Payer: Self-pay | Admitting: Family Medicine

## 2016-06-18 ENCOUNTER — Ambulatory Visit (INDEPENDENT_AMBULATORY_CARE_PROVIDER_SITE_OTHER): Payer: Medicaid Other | Admitting: Pharmacist

## 2016-06-18 ENCOUNTER — Encounter: Payer: Self-pay | Admitting: Pharmacist

## 2016-06-18 VITALS — Wt 211.0 lb

## 2016-06-18 DIAGNOSIS — E119 Type 2 diabetes mellitus without complications: Secondary | ICD-10-CM | POA: Diagnosis not present

## 2016-06-18 LAB — BASIC METABOLIC PANEL
BUN: 11 mg/dL (ref 7–25)
CO2: 31 mmol/L (ref 20–31)
Calcium: 9.5 mg/dL (ref 8.6–10.2)
Chloride: 103 mmol/L (ref 98–110)
Creat: 0.9 mg/dL (ref 0.50–1.10)
GLUCOSE: 102 mg/dL — AB (ref 65–99)
POTASSIUM: 4.2 mmol/L (ref 3.5–5.3)
SODIUM: 142 mmol/L (ref 135–146)

## 2016-06-18 LAB — GLUCOSE, CAPILLARY: Glucose-Capillary: 107 mg/dL — ABNORMAL HIGH (ref 65–99)

## 2016-06-18 LAB — POCT GLYCOSYLATED HEMOGLOBIN (HGB A1C): Hemoglobin A1C: 6.3

## 2016-06-18 NOTE — Patient Instructions (Signed)
Thank you for coming in today  Please continue to try to increase your exercise by walking at least 3 days per week   Continue to make healthy low carbohydrate food choices (limit breads, grains, starches)  Followup with Dr Leonides Schanz in December.

## 2016-06-18 NOTE — Progress Notes (Signed)
    S:    Patient arrives in good spirits with her son.  Presents for diabetes evaluation, education, and management at the request of Dr Ottie Glazier. Patient was referred on 06/11/16 during Nutrition visit with Dr Ottie Glazier.  Patient was last seen by Primary Care Provider on 04/16/16.   Patient reports having prediabetes for several years. She does have one A1C value from 2016 of 6.7% but denies ever being told she had diabetes. She is very hesitant to self monitor her blood glucose.   Patient reports adherence with medications.  She denies missing doses of her Genvoya or her other medications.  Current diabetes medications include: None Current hypertension medications include: Lasix 20 mg daily, Amlodipine 10 mg daily   Patient reported dietary habits: Eats 2 "real" meals/day.  States she has cut back on "junk food".  States she has not been eating breakfast yet so she doesn't have to catch up later in the day. She has been eating more fresh vegetables.   Patient reported exercise habits: Over the past week has not really started walking daily after her discussion with the nutritionist.  States she is trying to start walking.    Patient denies nocturia.  Patient reports neuropathy occasionally in her feet but is unsure what is causing the pain. Patient denies visual changes. Patient denies self foot exams.   O:  Lab Results  Component Value Date   HGBA1C 6.3 06/18/2016    A/P: Diabetes currently at goal A1C of 6.3% without medications. Patient reports adherence with non diabetes medications. Control is suboptimal due to sedentary lifestyle and high carbohydrate diet. Discussed diet and exercise and set goals for patient to continue to make changes. Next A1C anticipated in 3-6 months.  Discussed possibility of adding metformin at future visit.  Patient was very apprehensive about SMBG using BG meter.  Showed her how a meter works and provided education why checking her BG is important. She  states that she would consider checking her BG if her A1C increased in the future.   Written patient instructions provided.  Total time in face to face counseling 35 minutes.  Follow up in Clinic Visit with PCP in December.   Patient seen with Carole Binning, PharmD Candidate and Devota Pace, PharmD Resident and Hazle Nordmann, PharmD Resident.

## 2016-06-18 NOTE — Assessment & Plan Note (Signed)
Diabetes currently at goal A1C of 6.3% without medications. Patient reports adherence with non diabetes medications. Control is suboptimal due to sedentary lifestyle and high carbohydrate diet. Discussed diet and exercise and set goals for patient to continue to make changes. Next A1C anticipated in 3-6 months.  Discussed possibility of adding metformin at future visit.  Patient was very apprehensive about SMBG using BG meter.  Showed her how a meter works and provided education why checking her BG is important. She states that she would consider checking her BG if her A1C increased in the future.

## 2016-06-19 ENCOUNTER — Encounter: Payer: Self-pay | Admitting: Family Medicine

## 2016-06-19 NOTE — Progress Notes (Signed)
Patient ID: Leslie Gates, female   DOB: 1966-04-05, 50 y.o.   MRN: [id number] Reviewed: Agree with Dr. Macky Lower documentation and management.

## 2016-06-26 ENCOUNTER — Other Ambulatory Visit: Payer: Self-pay | Admitting: Internal Medicine

## 2016-06-26 DIAGNOSIS — Z1231 Encounter for screening mammogram for malignant neoplasm of breast: Secondary | ICD-10-CM

## 2016-07-02 ENCOUNTER — Ambulatory Visit: Payer: Medicaid Other | Admitting: Internal Medicine

## 2016-07-02 NOTE — Progress Notes (Deleted)
Chief Complaint:  Usual eating pattern includes *** meals and *** snacks per day. Usual physical activity includes ***.  24-hr recall: (Up at  AM) B ( AM)-    Snk ( AM)-    L ( PM)-   Snk ( PM)-   D ( PM)-   Snk ( PM)-   Typical day? {yes no:314532}  Handouts given during visit include:  AVS  ***  Goals:  Follow-up:    Learning Readiness:   Not ready  Contemplating  Ready  Change in progress  Demonstrated degree of understanding via:  Teach Back  Barriers to learning/adherence to lifestyle change: ***  Dani GobbleHillary Lunetta Marina, MD [facility], PGY-2

## 2016-07-09 ENCOUNTER — Ambulatory Visit
Admission: RE | Admit: 2016-07-09 | Discharge: 2016-07-09 | Disposition: A | Discharge status: Home / Self Care | Payer: Medicaid Other | Source: Ambulatory Visit | Attending: Internal Medicine | Admitting: Internal Medicine

## 2016-07-09 DIAGNOSIS — Z1231 Encounter for screening mammogram for malignant neoplasm of breast: Secondary | ICD-10-CM

## 2016-07-10 ENCOUNTER — Telehealth: Payer: Self-pay | Admitting: Pharmacist

## 2016-07-10 DIAGNOSIS — J4520 Mild intermittent asthma, uncomplicated: Secondary | ICD-10-CM

## 2016-07-10 DIAGNOSIS — J309 Allergic rhinitis, unspecified: Secondary | ICD-10-CM

## 2016-07-10 MED ORDER — BECLOMETHASONE DIPROP MONOHYD 42 MCG/SPRAY NA SUSP: 1.0000 | Freq: Two times a day (BID) | NASAL | 12 refills | Status: DC

## 2016-07-10 MED ORDER — BECLOMETHASONE DIPROPIONATE 80 MCG/ACT IN AERS: 2.0000 | INHALATION_SPRAY | Freq: Two times a day (BID) | RESPIRATORY_TRACT | 12 refills | Status: DC

## 2016-07-10 NOTE — Telephone Encounter (Signed)
Renee spoke to Leslie Gates on the phone regarding her Flonase and Earlie ServerBreo Ellipta that interact with her Genvoya.  She is having increased weight gain. Advised her to stop both and will send in new Rx's for Qvar and Beconase. Will FYI MDs.

## 2016-07-14 ENCOUNTER — Other Ambulatory Visit: Payer: Medicaid Other

## 2016-07-14 ENCOUNTER — Other Ambulatory Visit (HOSPITAL_COMMUNITY)
Admission: RE | Admit: 2016-07-14 | Discharge: 2016-07-14 | Disposition: A | Discharge status: Home / Self Care | Payer: Medicaid Other | Source: Ambulatory Visit | Attending: Internal Medicine | Admitting: Internal Medicine

## 2016-07-14 DIAGNOSIS — Z79899 Other long term (current) drug therapy: Secondary | ICD-10-CM

## 2016-07-14 DIAGNOSIS — Z113 Encounter for screening for infections with a predominantly sexual mode of transmission: Secondary | ICD-10-CM | POA: Diagnosis not present

## 2016-07-14 DIAGNOSIS — B20 Human immunodeficiency virus [HIV] disease: Secondary | ICD-10-CM

## 2016-07-14 LAB — COMPLETE METABOLIC PANEL WITH GFR
ALK PHOS: 62 U/L (ref 33–115)
ALT: 11 U/L (ref 6–29)
AST: 15 U/L (ref 10–35)
Albumin: 4.3 g/dL (ref 3.6–5.1)
BUN: 11 mg/dL (ref 7–25)
CALCIUM: 9.7 mg/dL (ref 8.6–10.2)
CO2: 28 mmol/L (ref 20–31)
CREATININE: 0.82 mg/dL (ref 0.50–1.10)
Chloride: 103 mmol/L (ref 98–110)
GFR, Est African American: >89 mL/min (ref >=60)
GFR, Est Non African American: 84 mL/min (ref >=60)
Glucose, Bld: 115 mg/dL — ABNORMAL HIGH (ref 65–99)
Potassium: 4.1 mmol/L (ref 3.5–5.3)
Sodium: 137 mmol/L (ref 135–146)
Total Bilirubin: 0.3 mg/dL (ref 0.2–1.2)
Total Protein: 7.8 g/dL (ref 6.1–8.1)

## 2016-07-14 LAB — CBC WITH DIFFERENTIAL/PLATELET
BASOS PCT: 1 %
Basophils Absolute: 46 cells/uL (ref 0–200)
EOS ABS: 322 {cells}/uL (ref 15–500)
Eosinophils Relative: 7 %
HEMATOCRIT: 37.6 % (ref 35.0–45.0)
HEMOGLOBIN: 12.5 g/dL (ref 11.7–15.5)
LYMPHS ABS: 2116 {cells}/uL (ref 850–3900)
LYMPHS PCT: 46 %
MCH: 29.6 pg (ref 27.0–33.0)
MCHC: 33.2 g/dL (ref 32.0–36.0)
MCV: 88.9 fL (ref 80.0–100.0)
MONO ABS: 414 {cells}/uL (ref 200–950)
MPV: 9.7 fL (ref 7.5–12.5)
Monocytes Relative: 9 %
NEUTROS PCT: 37 %
Neutro Abs: 1702 cells/uL (ref 1500–7800)
Platelets: 330 10*3/uL (ref 140–400)
RBC: 4.23 MIL/uL (ref 3.80–5.10)
RDW: 14.1 % (ref 11.0–15.0)
WBC: 4.6 10*3/uL (ref 3.8–10.8)

## 2016-07-14 LAB — LIPID PANEL
CHOLESTEROL: 192 mg/dL (ref 125–200)
HDL: 51 mg/dL (ref >=46)
LDL Cholesterol: 94 mg/dL (ref <130)
TRIGLYCERIDES: 233 mg/dL — AB (ref <150)
Total CHOL/HDL Ratio: 3.8 Ratio (ref <=5.0)
VLDL: 47 mg/dL — ABNORMAL HIGH (ref <30)

## 2016-07-15 LAB — T-HELPER CELL (CD4) - (RCID CLINIC ONLY)
CD4 T CELL ABS: 570 /uL (ref 400–2700)
CD4 T CELL HELPER: 26 % — AB (ref 33–55)

## 2016-07-15 LAB — RPR

## 2016-07-16 LAB — HIV-1 RNA QUANT-NO REFLEX-BLD

## 2016-07-16 LAB — URINE CYTOLOGY ANCILLARY ONLY
Chlamydia: NEGATIVE
Neisseria Gonorrhea: NEGATIVE

## 2016-07-16 NOTE — Telephone Encounter (Signed)
Can we add her back to the schedule on Monday the 11th in the morning - adding a new slot

## 2016-07-17 NOTE — Telephone Encounter (Signed)
Dr Drue SecondSnider she is already scheduled for 07/28/16 to see you do you really want me to move her?

## 2016-07-21 ENCOUNTER — Other Ambulatory Visit: Payer: Self-pay | Admitting: Family Medicine

## 2016-07-28 ENCOUNTER — Encounter: Payer: Self-pay | Admitting: Internal Medicine

## 2016-07-28 ENCOUNTER — Ambulatory Visit (INDEPENDENT_AMBULATORY_CARE_PROVIDER_SITE_OTHER): Payer: Medicaid Other | Admitting: Internal Medicine

## 2016-07-28 DIAGNOSIS — Z23 Encounter for immunization: Secondary | ICD-10-CM | POA: Diagnosis present

## 2016-07-28 NOTE — Progress Notes (Signed)
RFV: follow up for HIV disease Patient ID: Leslie Gates, female   DOB: 12-Oct-1966, 50 y.o.   MRN: [id number]  HPI  Leslie Gates is a 50yo F with HIV Disease, HTN, OHS, CD 4 count of 570/VL<20 on genvoya. She continues to do well with her medication adherence. She denies any difficulty with taking meds  Outpatient Encounter Prescriptions as of 07/28/2016  Medication Sig  . albuterol (PROAIR HFA) 108 (90 BASE) MCG/ACT inhaler Inhale 1-2 puffs into the lungs every 6 (six) hours as needed for wheezing or shortness of breath.  Marland Kitchen amLODipine (NORVASC) 10 MG tablet Take 1 tablet (10 mg total) by mouth daily.  . beclomethasone (BECONASE-AQ) 42 MCG/SPRAY nasal spray Place 1 spray into both nostrils 2 (two) times daily. Dose is for each nostril.  . beclomethasone (QVAR) 80 MCG/ACT inhaler Inhale 2 puffs into the lungs 2 (two) times daily.  . cetirizine (ZYRTEC) 10 MG tablet TAKE 1 TABLET BY MOUTH DAILY  . furosemide (LASIX) 20 MG tablet Take 1 tablet (20 mg total) by mouth daily.  . GENVOYA 150-150-200-10 MG TABS tablet TAKE 1 TABLET BY MOUTH EVERY DAY WITH BREAKFAST  . loratadine (CLARITIN) 10 MG tablet Take 1 tablet (10 mg total) by mouth daily.  . Multiple Vitamin (MULTI-VITAMINS) TABS TAKE 1 TABLET BY MOUTH DAILY  . omeprazole (PRILOSEC) 40 MG capsule TAKE ONE CAPSULE BY MOUTH DAILY   No facility-administered encounter medications on file as of 07/28/2016.      Patient Active Problem List   Diagnosis Date Noted  . Diabetes (HCC) 04/16/2016  . Diarrhea 04/16/2016  . Dyspnea 04/08/2015  . Excessive ear wax 03/01/2015  . Knee pain, acute 10/23/2014  . Moderate dysplasia of cervix 07/10/2013  . Chemical conjunctivitis 09/22/2012  . Angioedema of lips 09/09/2011  . Rash 08/12/2011  . Acute bronchitis 04/10/2011  . Obesity hypoventilation syndrome (HCC) 07/10/2010  . Obesity (BMI 30.0-34.9) 07/01/2010  . Obstructive sleep apnea 06/27/2010  . HYPERTENSION, PULMONARY 05/28/2010  .  SHORTNESS OF BREATH 05/08/2010  . Allergic rhinitis 03/14/2008  . DYSPLASIA OF CERVIX UNSPECIFIED 12/19/2007  . HERPES ZOSTER 09/15/2007  . OTITIS MEDIA, CHRONIC 03/22/2007  . HYPERTENSION 03/22/2007  . HIV DISEASE 06/21/2002     Health Maintenance Due  Topic Date Due  . FOOT EXAM  07/05/1977  . OPHTHALMOLOGY EXAM  07/05/1977  . URINE MICROALBUMIN  07/05/1977  . TETANUS/TDAP  07/05/1986  . INFLUENZA VACCINE  06/16/2016     Review of Systems 10 point ros is negative Physical Exam   BP (!) 142/86   Pulse 80   Temp 97.4 F (36.3 C) (Oral)   Wt 213 lb (96.6 kg)   LMP 07/17/2016   BMI 37.73 kg/m  Physical Exam  Constitutional:  oriented to person, place, and time. appears well-developed and well-nourished. No distress.  HENT: [place]/AT, PERRLA, no scleral icterus Mouth/Throat: Oropharynx is clear and moist. No oropharyngeal exudate.  Cardiovascular: Normal rate, regular rhythm and normal heart sounds. Exam reveals no gallop and no friction rub.  No murmur heard.  Pulmonary/Chest: Effort normal and breath sounds normal. No respiratory distress.  has no wheezes.  Neck = supple, no nuchal rigidity Abdominal: Soft. Bowel sounds are normal.  exhibits no distension. There is no tenderness.  Lymphadenopathy: no cervical adenopathy. No axillary adenopathy Neurological: alert and oriented to person, place, and time.  Skin: Skin is warm and dry. No rash noted. No erythema.  Psychiatric: a normal mood and affect.  behavior is normal.  Lab Results  Component Value Date   CD4TCELL 26 (L) 07/14/2016   Lab Results  Component Value Date   CD4TABS 570 07/14/2016   CD4TABS 500 01/16/2016   CD4TABS 470 09/09/2015   Lab Results  Component Value Date   HIV1RNAQUANT <20 07/14/2016   Lab Results  Component Value Date   HEPBSAB NEG 02/08/2009   No results found for: RPR  CBC Lab Results  Component Value Date   WBC 4.6 07/14/2016   RBC 4.23 07/14/2016   HGB 12.5 07/14/2016   HCT  37.6 07/14/2016   PLT 330 07/14/2016   MCV 88.9 07/14/2016   MCH 29.6 07/14/2016   MCHC 33.2 07/14/2016   RDW 14.1 07/14/2016   LYMPHSABS 2,116 07/14/2016   MONOABS 414 07/14/2016   EOSABS 322 07/14/2016   BASOSABS 46 07/14/2016   BMET Lab Results  Component Value Date   NA 137 07/14/2016   K 4.1 07/14/2016   CL 103 07/14/2016   CO2 28 07/14/2016   GLUCOSE 115 (H) 07/14/2016   BUN 11 07/14/2016   CREATININE 0.82 07/14/2016   CALCIUM 9.7 07/14/2016   GFRNONAA 84 07/14/2016   GFRAA >89 07/14/2016     Assessment and Plan  hiv disease= continue on genvoya  Essential hypertension = continue on amlodipine  Health maintenance = recommend to get flu shot

## 2016-07-30 DIAGNOSIS — Z23 Encounter for immunization: Secondary | ICD-10-CM | POA: Diagnosis not present

## 2016-08-20 ENCOUNTER — Other Ambulatory Visit: Payer: Self-pay | Admitting: Family Medicine

## 2016-08-26 ENCOUNTER — Ambulatory Visit: Payer: Self-pay | Admitting: Obstetrics

## 2016-09-10 ENCOUNTER — Ambulatory Visit: Payer: Medicaid Other | Admitting: Obstetrics

## 2016-09-18 ENCOUNTER — Other Ambulatory Visit: Payer: Self-pay | Admitting: Family Medicine

## 2016-09-30 ENCOUNTER — Encounter: Payer: Self-pay | Admitting: Certified Nurse Midwife

## 2016-09-30 ENCOUNTER — Other Ambulatory Visit (HOSPITAL_COMMUNITY)
Admission: RE | Admit: 2016-09-30 | Discharge: 2016-09-30 | Disposition: A | Discharge status: Home / Self Care | Payer: Medicaid Other | Source: Ambulatory Visit | Attending: Certified Nurse Midwife | Admitting: Certified Nurse Midwife

## 2016-09-30 ENCOUNTER — Ambulatory Visit (INDEPENDENT_AMBULATORY_CARE_PROVIDER_SITE_OTHER): Payer: Medicaid Other | Admitting: Certified Nurse Midwife

## 2016-09-30 VITALS — BP 147/94 | HR 73 | Wt 213.0 lb

## 2016-09-30 DIAGNOSIS — Z113 Encounter for screening for infections with a predominantly sexual mode of transmission: Secondary | ICD-10-CM

## 2016-09-30 DIAGNOSIS — Z01411 Encounter for gynecological examination (general) (routine) with abnormal findings: Secondary | ICD-10-CM | POA: Insufficient documentation

## 2016-09-30 DIAGNOSIS — Z124 Encounter for screening for malignant neoplasm of cervix: Secondary | ICD-10-CM

## 2016-09-30 DIAGNOSIS — Z01419 Encounter for gynecological examination (general) (routine) without abnormal findings: Secondary | ICD-10-CM

## 2016-09-30 DIAGNOSIS — Z1151 Encounter for screening for human papillomavirus (HPV): Secondary | ICD-10-CM | POA: Insufficient documentation

## 2016-09-30 DIAGNOSIS — B20 Human immunodeficiency virus [HIV] disease: Secondary | ICD-10-CM

## 2016-09-30 DIAGNOSIS — Z0000 Encounter for general adult medical examination without abnormal findings: Secondary | ICD-10-CM

## 2016-09-30 NOTE — Progress Notes (Signed)
Subjective:        Leslie Gates is a 50 y.o. female here for a routine exam.   Current complaints: menses are monthly lasting about 3-4 days, missed period for this month.  Denies any premenopausal symptoms.  Had BTL.  Is sexually active.      Personal health questionnaire:  Is patient [name], have a family history of breast and/or ovarian cancer: no Is there a family history of uterine cancer diagnosed at age < 850, gastrointestinal cancer, urinary tract cancer, family member who is a Personnel officerLynch syndrome-associated carrier: no Is the patient overweight and hypertensive, family history of diabetes, personal history of gestational diabetes, preeclampsia or PCOS: yes Is patient over 5755, have PCOS,  family history of premature CHD under age 49, diabetes, smoke, have hypertension or peripheral artery disease:  Yes, everyone has HTN, kidney dialysis and rectal bleeding.   At any time, has a partner hit, kicked or otherwise hurt or frightened you?: no Over the past 2 weeks, have you felt down, depressed or hopeless?: no Over the past 2 weeks, have you felt little interest or pleasure in doing things?:no  Gynecologic History Patient's last menstrual period was 08/21/2016 (exact date). Contraception: tubal ligation Last Pap: 05/16/15 LSIL, CIN1. Colpo 09/18/15.  Last mammogram: 07/10/16. Results were: normal  Obstetric History OB History  No data available    Past Medical History:  Diagnosis Date  . Allergic rhinitis   . Chronic otitis media   . Coronary artery disease   . Diastolic CHF, chronic (HCC)   . Dysphagia   . History of cervical dysplasia   . History of herpes zoster   . HTN (hypertension)   . Human immunodeficiency virus (HIV) (HCC) 2001  . Obesity   . OSA (obstructive sleep apnea)   . Pulmonary HTN   . Wrist fracture, bilateral     Past Surgical History:  Procedure Laterality Date  . ADENOIDECTOMY    . CESAREAN SECTION    . NASAL SINUS SURGERY  2004   [place]  . RIGHT HEART CATHETERIZATION N/A 02/01/2012   Procedure: RIGHT HEART CATH;  Surgeon: Dolores Pattyaniel R Bensimhon, MD;  [facility];  Service: Cardiovascular;  Laterality: N/A;  . TYMPANOSTOMY TUBE PLACEMENT     as a child  . WRIST FRACTURE SURGERY  2006   bilateral - [place]     Current Outpatient Prescriptions:  .  albuterol (PROAIR HFA) 108 (90 BASE) MCG/ACT inhaler, Inhale 1-2 puffs into the lungs every 6 (six) hours as needed for wheezing or shortness of breath., Disp: 8.5 each, Rfl: 5 .  amLODipine (NORVASC) 10 MG tablet, Take 1 tablet (10 mg total) by mouth daily., Disp: 30 tablet, Rfl: 11 .  beclomethasone (BECONASE-AQ) 42 MCG/SPRAY nasal spray, Place 1 spray into both nostrils 2 (two) times daily. Dose is for each nostril., Disp: 25 g, Rfl: 12 .  beclomethasone (QVAR) 80 MCG/ACT inhaler, Inhale 2 puffs into the lungs 2 (two) times daily., Disp: 1 Inhaler, Rfl: 12 .  furosemide (LASIX) 20 MG tablet, Take 1 tablet (20 mg total) by mouth daily., Disp: 30 tablet, Rfl: 5 .  GENVOYA 150-150-200-10 MG TABS tablet, TAKE 1 TABLET BY MOUTH EVERY DAY WITH BREAKFAST, Disp: 30 tablet, Rfl: 4 .  loratadine (CLARITIN) 10 MG tablet, Take 1 tablet (10 mg total) by mouth daily., Disp: 30 tablet, Rfl: 4 .  Multiple Vitamin (MULTI-VITAMINS) TABS, TAKE 1 TABLET BY MOUTH DAILY, Disp: 30 tablet, Rfl: 0 .  omeprazole (  PRILOSEC) 40 MG capsule, TAKE ONE CAPSULE BY MOUTH DAILY, Disp: 30 capsule, Rfl: 0 .  cetirizine (ZYRTEC) 10 MG tablet, TAKE 1 TABLET BY MOUTH DAILY (Patient not taking: Reported on 09/30/2016), Disp: 30 tablet, Rfl: 0 Allergies  Allergen Reactions  . Lisinopril Swelling    Very swollen bottom lip    Social History  Substance Use Topics  . Smoking status: Never Smoker  . Smokeless tobacco: Never Used  . Alcohol use No    Family History  Problem Relation Age of Onset  . Other Mother [id number]    ESRD  . Hyperlipidemia Mother   . Hypertension Mother   . Alcohol abuse Mother   .  Other Father     GSW  . Hypertension Father   . Kidney disease Father     was on dialysis prior to death  . Alcohol abuse Father     liver failure from alcohol use  . Hypertension    . Hypertension Sister   . Alcohol abuse Sister   . Hypertension Brother   . Alcohol abuse Brother       Review of Systems  Constitutional: negative for fatigue and weight loss Respiratory: negative for cough and wheezing Cardiovascular: negative for chest pain, fatigue and palpitations Gastrointestinal: negative for abdominal pain and change in bowel habits Musculoskeletal:negative for myalgias Neurological: negative for gait problems and tremors Behavioral/Psych: negative for abusive relationship, depression Endocrine: negative for temperature intolerance    Genitourinary:negative for abnormal menstrual periods, genital lesions, hot flashes, sexual problems and vaginal discharge Integument/breast: negative for breast lump, breast tenderness, nipple discharge and skin lesion(s)    Objective:       BP (!) 147/94   Pulse 73   Wt 213 lb (96.6 kg)   LMP 08/21/2016 (Exact Date)   BMI 37.73 kg/m  General:   alert  Skin:   no rash or abnormalities  Lungs:   clear to auscultation bilaterally  Heart:   regular rate and rhythm, S1, S2 normal, no murmur, click, rub or gallop  Breasts:   normal without suspicious masses, skin or nipple changes or axillary nodes  Abdomen:  normal findings: no organomegaly, soft, non-tender and no hernia  Pelvis:  External genitalia: normal general appearance Urinary system: urethral meatus normal and bladder without fullness, nontender Vaginal: normal without tenderness, induration or masses Cervix: normal appearance Adnexa: normal bimanual exam Uterus: anteverted and non-tender, normal size   Lab Review Urine pregnancy test Labs reviewed yes Radiologic studies reviewed yes  50% of 30 min visit spent on counseling and coordination of care.    Assessment:     Healthy female exam.   Has HIV  STD screening exam  Plan:    Education reviewed: calcium supplements, depression evaluation, low fat, low cholesterol diet, self breast exams, skin cancer screening and weight bearing exercise. Contraception: tubal ligation. Follow up in: 1 year. up to date on mammogram   No orders of the defined types were placed in this encounter.  Orders Placed This Encounter  Procedures  . NuSwab Vaginitis Plus (VG+)

## 2016-10-03 LAB — NUSWAB VAGINITIS PLUS (VG+)
CHLAMYDIA TRACHOMATIS, NAA: NEGATIVE
Candida albicans, NAA: NEGATIVE
Candida glabrata, NAA: NEGATIVE
NEISSERIA GONORRHOEAE, NAA: NEGATIVE
Trich vag by NAA: NEGATIVE

## 2016-10-05 LAB — CYTOLOGY - PAP: HPV: DETECTED — AB

## 2016-10-06 ENCOUNTER — Other Ambulatory Visit: Payer: Self-pay | Admitting: Certified Nurse Midwife

## 2016-10-20 ENCOUNTER — Other Ambulatory Visit: Payer: Self-pay | Admitting: Internal Medicine

## 2016-10-20 ENCOUNTER — Other Ambulatory Visit: Payer: Self-pay | Admitting: Family Medicine

## 2016-10-20 DIAGNOSIS — B20 Human immunodeficiency virus [HIV] disease: Secondary | ICD-10-CM

## 2016-11-23 ENCOUNTER — Other Ambulatory Visit: Payer: Self-pay | Admitting: Family Medicine

## 2016-11-30 ENCOUNTER — Telehealth: Payer: Self-pay | Admitting: Pulmonary Disease

## 2016-11-30 MED ORDER — FUROSEMIDE 20 MG PO TABS: 20.0000 mg | ORAL_TABLET | Freq: Every day | ORAL | 5 refills | Status: DC

## 2016-11-30 NOTE — Telephone Encounter (Signed)
Refill has been sent to the pharmacy. Nothing further is needed. 

## 2016-12-08 ENCOUNTER — Encounter: Payer: Self-pay | Admitting: Pulmonary Disease

## 2016-12-08 NOTE — Telephone Encounter (Signed)
This was opened in error.  It will not let me close this.  Please close.  Thank you.

## 2016-12-09 ENCOUNTER — Ambulatory Visit (INDEPENDENT_AMBULATORY_CARE_PROVIDER_SITE_OTHER): Payer: Medicaid Other | Admitting: Pulmonary Disease

## 2016-12-09 ENCOUNTER — Encounter: Payer: Self-pay | Admitting: Pulmonary Disease

## 2016-12-09 VITALS — BP 116/74 | HR 70 | Ht 63.0 in | Wt 219.6 lb

## 2016-12-09 DIAGNOSIS — J9611 Chronic respiratory failure with hypoxia: Secondary | ICD-10-CM | POA: Diagnosis not present

## 2016-12-09 DIAGNOSIS — E662 Morbid (severe) obesity with alveolar hypoventilation: Secondary | ICD-10-CM

## 2016-12-09 DIAGNOSIS — G4733 Obstructive sleep apnea (adult) (pediatric): Secondary | ICD-10-CM | POA: Diagnosis not present

## 2016-12-09 DIAGNOSIS — J4530 Mild persistent asthma, uncomplicated: Secondary | ICD-10-CM

## 2016-12-09 DIAGNOSIS — Z9989 Dependence on other enabling machines and devices: Secondary | ICD-10-CM

## 2016-12-09 IMAGING — CR DG CHEST 2V
2 series · 2 of 2 positions shown · non-contrast
Comparison: April 25, 2014

CLINICAL DATA: Shortness of breath and chest tightness for 3 weeks

EXAM:
CHEST  2 VIEW

[view not recorded (1 of 2)]
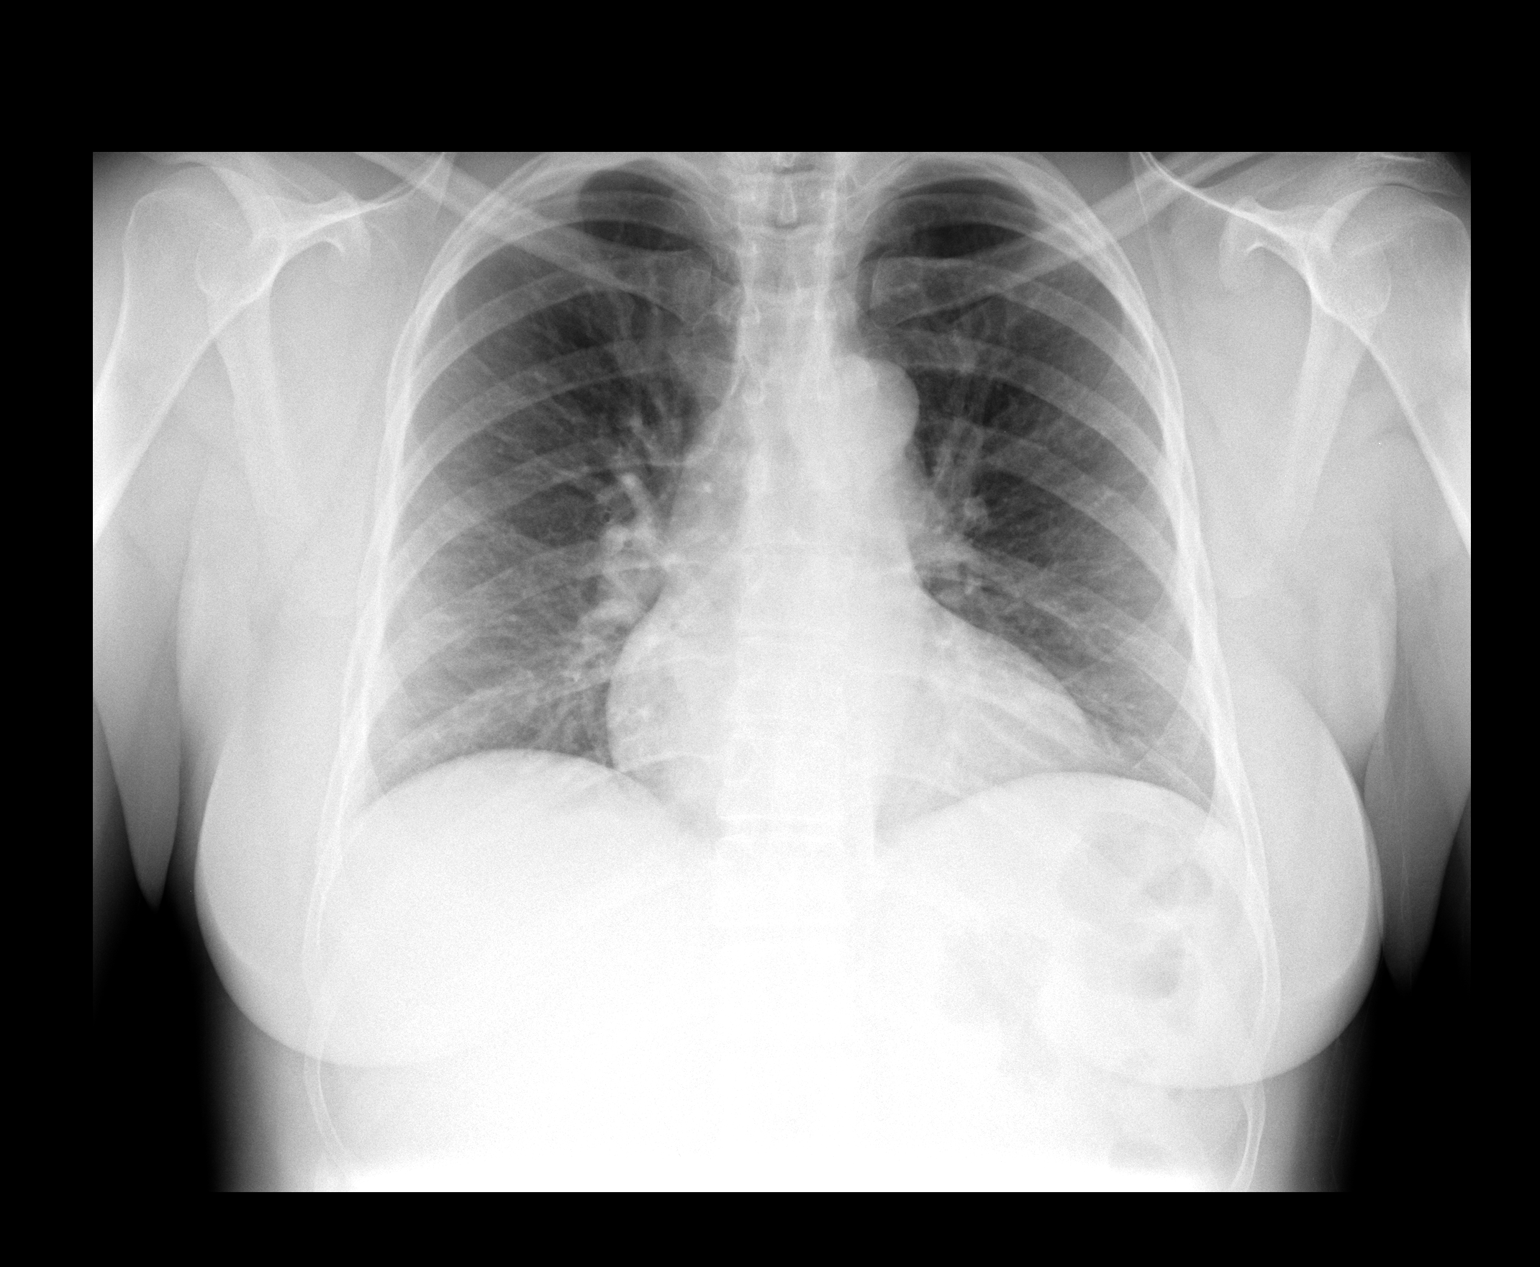

[view not recorded (2 of 2)]
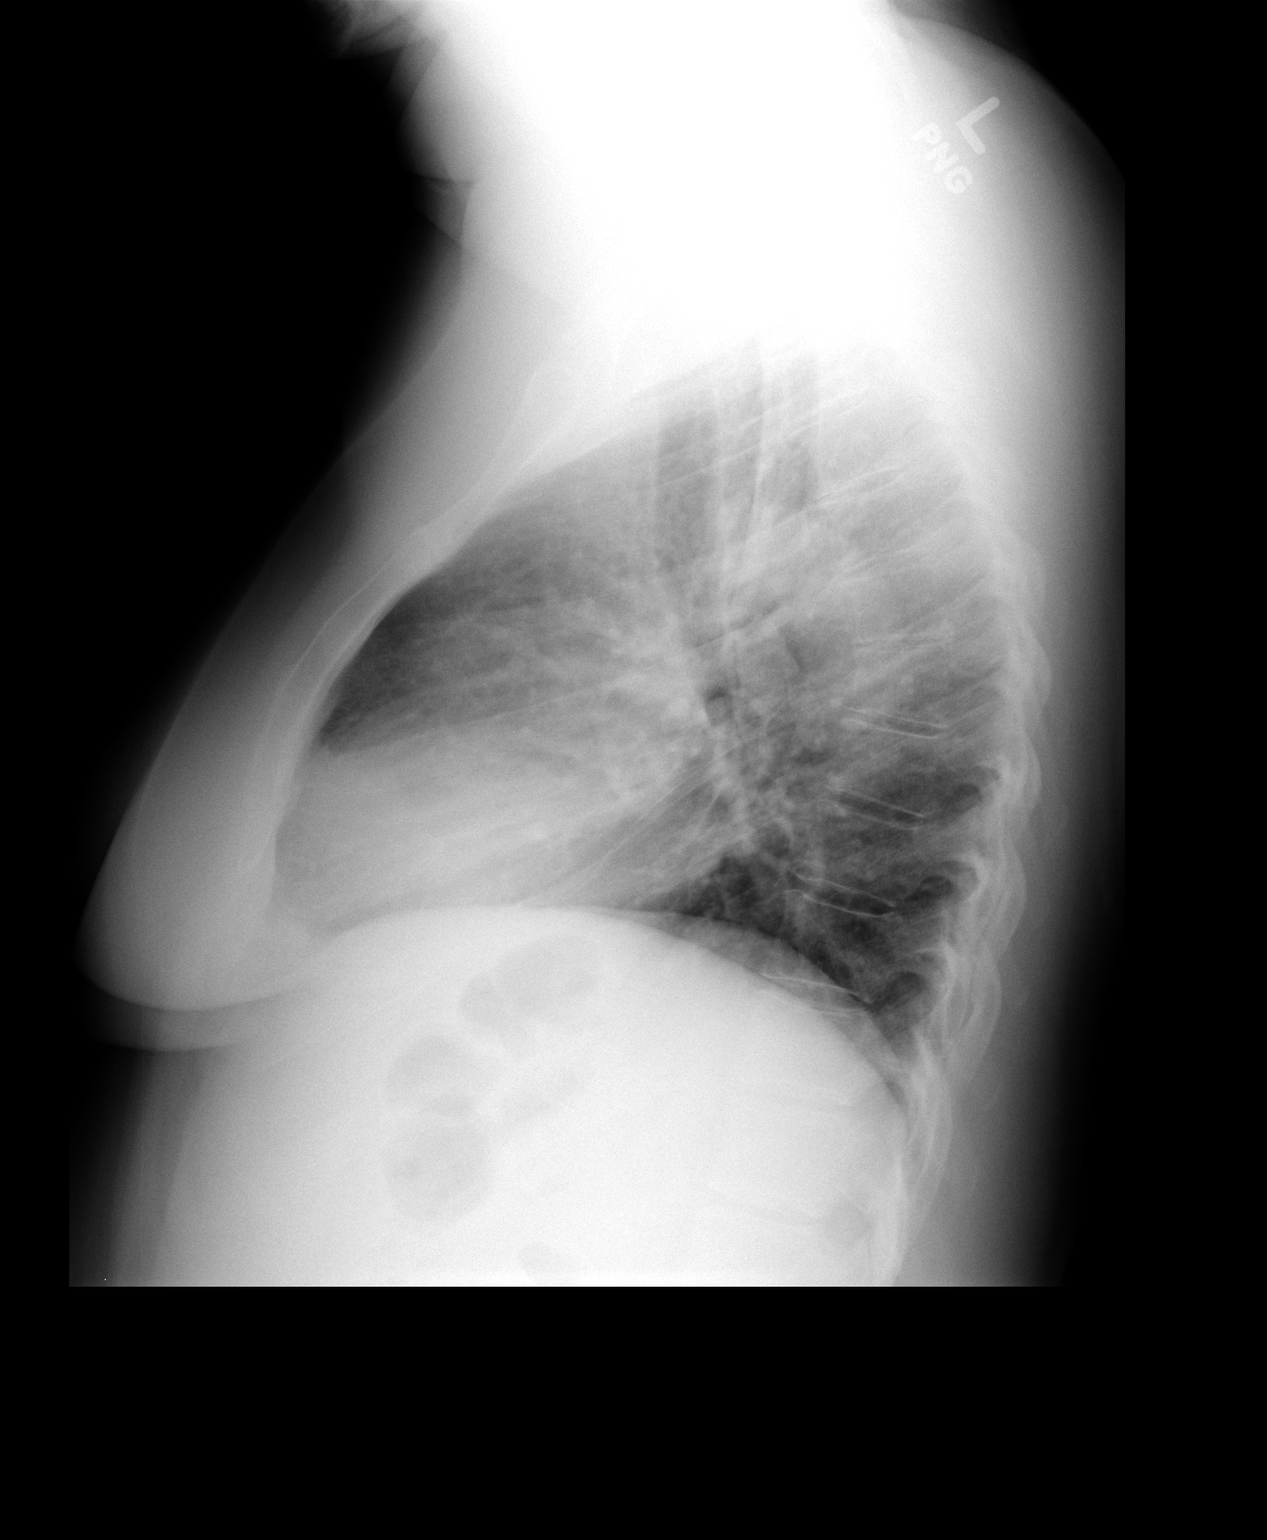

[2 of 2 positions shown; findings below may reference images not displayed]

FINDINGS: Lungs are clear. Heart size and pulmonary vascularity are normal. No
pneumothorax. No adenopathy. No bone lesions.
IMPRESSION: No edema or consolidation.

## 2016-12-09 NOTE — Progress Notes (Signed)
Current Outpatient Prescriptions on File Prior to Visit  Medication Sig  . albuterol (PROAIR HFA) 108 (90 BASE) MCG/ACT inhaler Inhale 1-2 puffs into the lungs every 6 (six) hours as needed for wheezing or shortness of breath.  Marland Kitchen. amLODipine (NORVASC) 10 MG tablet Take 1 tablet (10 mg total) by mouth daily.  . beclomethasone (BECONASE-AQ) 42 MCG/SPRAY nasal spray Place 1 spray into both nostrils 2 (two) times daily. Dose is for each nostril.  . beclomethasone (QVAR) 80 MCG/ACT inhaler Inhale 2 puffs into the lungs 2 (two) times daily.  . cetirizine (ZYRTEC) 10 MG tablet TAKE 1 TABLET BY MOUTH DAILY  . furosemide (LASIX) 20 MG tablet Take 1 tablet (20 mg total) by mouth daily.  . GENVOYA 150-150-200-10 MG TABS tablet TAKE 1 TABLET BY MOUTH EVERY DAY WITH BREAKFAST  . loratadine (CLARITIN) 10 MG tablet Take 1 tablet (10 mg total) by mouth daily.  . Multiple Vitamin (MULTI-VITAMINS) TABS TAKE 1 TABLET BY MOUTH DAILY  . omeprazole (PRILOSEC) 40 MG capsule TAKE ONE CAPSULE BY MOUTH DAILY   No current facility-administered medications on file prior to visit.     Chief Complaint  Patient presents with  . Follow-up    6 month follow up. Breathing has been ok since last visit.     Sleep tests PSG 06/08/04 >> RDI 92.1, oxygen nadir 57% ONO on RA 08/03/13 >> 203 min with SpO2 < 88% Split night 12/22/13 >> AHI 27, SaO2 low 77%. CPAP 12 cm H2O >> AHI 5.9, +R, +S. Did not need supplemental oxygen. ONO with CPAP and 2 liters 02/13/14 >> Test time 8 hrs 36 min. Basal SpO2 98%, low SpO2 63% (artifact). Spent 0.4 min with SpO2 < 88%.  CPAP 11/10/16 to 12/09/16 >> used on 30 of 30 nights with average 8 hrs 16 min.  Average AHI 11 with CPAP 12 cm H2O  Pulmonary tests PFT 01/29/14 >> FEV1 1.79 (81%), FEV1% 86, TLC 3.47 (74%), DLCO 71% PFT 04/29/15 >> FEV1 1.53 (66%), FEV1% 80, TLC 3.74 (76%), DLCO 46%  Cardiac tests Echo 04/12/15 >> EF 65 to 70%, PAS 35 mmHg  Past medical history Diastolic CHF, HIV, HTN  ,CAD  Past surgical history, Family history, Social history, Allergies reviewed.  Vital signs BP 116/74 (BP Location: Left Arm, Patient Position: Sitting, Cuff Size: Normal)   Pulse 70   Ht 5\' 3"  (1.6 m)   Wt 219 lb 9.6 oz (99.6 kg)   SpO2 98%   BMI 38.90 kg/m    History of Present Illness: Leslie Gates is a 51 y.o. female with OSA, OHS, and asthma.  She was having trouble with cough and chest congestion earlier in the winter.  This is better now.  She denies wheeze, chest tightness, sinus congestion, or sore throat.  She is doing well with CPAP.  No issues with mask fit.  She got a letter for jury duty.  Physical Exam:  General - pleasant ENT - no sinus tenderness, MP 3, scalloped tongue Cardiac - regular, no murmur Chest - no wheeze, rales Abd - soft, non tender Ext - no edema Neuro - normal strength Skin - no rashes Psych - normal mood   Assessment/Plan:  Persistent asthma. - continue Qvar and prn Proair  Obstructive sleep apnea. - she is compliant with CPAP but AHI still elevated on download - will increase CPAP to 13 cm H2O  Chronic respiratory failure 2nd to obesity hypoventilation syndrome. - continue 2 liters oxygen at night with CPAP -  reassess O2 needs if she is able to loose weight  Jury duty. - explained that should would not qualify for exclusion from jury duty based on her current pulmonary status   Patient Instructions  Will have CPAP changed to 13 cm H2O  Follow up in 1 year    Coralyn Helling, MD [place] Pager:  [phone] 12/09/2016, 1:53 PM

## 2016-12-09 NOTE — Patient Instructions (Signed)
Will have CPAP changed to 13 cm H2O  Follow up in 1 year

## 2016-12-11 ENCOUNTER — Encounter: Payer: Medicaid Other | Admitting: Obstetrics

## 2016-12-17 ENCOUNTER — Other Ambulatory Visit: Payer: Self-pay | Admitting: Family Medicine

## 2017-01-14 ENCOUNTER — Other Ambulatory Visit: Payer: Self-pay | Admitting: Internal Medicine

## 2017-01-25 ENCOUNTER — Other Ambulatory Visit: Payer: Medicaid Other

## 2017-01-29 ENCOUNTER — Encounter: Payer: Self-pay | Admitting: Family Medicine

## 2017-01-29 ENCOUNTER — Ambulatory Visit (INDEPENDENT_AMBULATORY_CARE_PROVIDER_SITE_OTHER): Payer: Medicaid Other | Admitting: Family Medicine

## 2017-01-29 VITALS — BP 142/98 | HR 86 | Temp 98.6°F | Ht 63.0 in | Wt 217.0 lb

## 2017-01-29 DIAGNOSIS — H663X3 Other chronic suppurative otitis media, bilateral: Secondary | ICD-10-CM | POA: Diagnosis not present

## 2017-01-29 DIAGNOSIS — J309 Allergic rhinitis, unspecified: Secondary | ICD-10-CM | POA: Diagnosis not present

## 2017-01-29 DIAGNOSIS — E119 Type 2 diabetes mellitus without complications: Secondary | ICD-10-CM | POA: Diagnosis present

## 2017-01-29 LAB — POCT GLYCOSYLATED HEMOGLOBIN (HGB A1C): Hemoglobin A1C: 6.4

## 2017-01-29 MED ORDER — NAPHAZOLINE-PHENIRAMINE 0.025-0.3 % OP SOLN: 1.0000 [drp] | Freq: Four times a day (QID) | OPHTHALMIC | 0 refills | Status: DC | PRN

## 2017-01-29 MED ORDER — BECLOMETHASONE DIPROP MONOHYD 42 MCG/SPRAY NA SUSP: 1.0000 | Freq: Two times a day (BID) | NASAL | 12 refills | Status: DC

## 2017-01-29 MED ORDER — FLUTICASONE PROPIONATE 50 MCG/ACT NA SUSP: 2.0000 | Freq: Every day | NASAL | 0 refills | Status: DC

## 2017-01-29 NOTE — Patient Instructions (Addendum)
I have prescribed you eye drops. I have not prescribed the Flonase because it can interact with your HIV medication. Continue beclomethasone nasal spray, I refilled this. I have referred you to the ear doctor.  If you develop worsening symptoms, fevers, chills, or shortness of breath follow up in our clinic.

## 2017-01-29 NOTE — Assessment & Plan Note (Signed)
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 - referral to ENT

## 2017-01-29 NOTE — Assessment & Plan Note (Signed)
A1c at goal at 6.4

## 2017-01-29 NOTE — Assessment & Plan Note (Signed)
Exam most consistent with allergic rhinitis. No infectious symptoms. No symptoms/exam findings concerning for glaucoma. Denies vision change.  - Rx for Naphcon-A for ocular irritation - no flonase as it can interact with her HIV medication, advised her to start beclomethasone nasal spray. - continue anti-histamine

## 2017-01-29 NOTE — Progress Notes (Signed)
Subjective: CC: allergies HPI: Patient is a 51 y.o. female with a past medical history of HIV, allergic rhinitis presenting to clinic today for allergies.  Patient noted watery eyes 2 weeks ago.  Clear drainage Notes eyes are crusted over in the morning.  Notes mild conjunctival injection intermittently.  Her eyes are itchy, no pain No photophobia or pain with EOM.  Patient notes difficultly hearing for 2 weeks as well. She notes she "has not tympanic membranes" as she's had tubes multiple times. Things sound muffled.  On ROS, patient notes sneezing, coughing, nasal congestion Denies rhinorrhea, SOB, chest pain, fevers/chills, otalgias, otorrhea, myalgias  Tried Visine which didn't help. Taking Claritin. She used to get eye drops which were very helpful but cannot recall the name   DM: not on any medications  Social History: non-smoker   ROS: All other systems reviewed and are negative.  Past Medical History Patient Active Problem List   Diagnosis Date Noted  . Diabetes (HCC) 04/16/2016  . Diarrhea 04/16/2016  . Dyspnea 04/08/2015  . Excessive ear wax 03/01/2015  . Knee pain, acute 10/23/2014  . Moderate dysplasia of cervix 07/10/2013  . Chemical conjunctivitis 09/22/2012  . Angioedema of lips 09/09/2011  . Rash 08/12/2011  . Acute bronchitis 04/10/2011  . Obesity hypoventilation syndrome (HCC) 07/10/2010  . Obesity (BMI 30.0-34.9) 07/01/2010  . Obstructive sleep apnea 06/27/2010  . HYPERTENSION, PULMONARY 05/28/2010  . SHORTNESS OF BREATH 05/08/2010  . Allergic rhinitis 03/14/2008  . DYSPLASIA OF CERVIX UNSPECIFIED 12/19/2007  . HERPES ZOSTER 09/15/2007  . OTITIS MEDIA, CHRONIC 03/22/2007  . HYPERTENSION 03/22/2007  . HIV DISEASE 06/21/2002    Medications- reviewed and updated Current Outpatient Prescriptions  Medication Sig Dispense Refill  . albuterol (PROAIR HFA) 108 (90 BASE) MCG/ACT inhaler Inhale 1-2 puffs into the lungs every 6 (six) hours as  needed for wheezing or shortness of breath. 8.5 each 5  . amLODipine (NORVASC) 10 MG tablet Take 1 tablet (10 mg total) by mouth daily. 30 tablet 11  . beclomethasone (BECONASE-AQ) 42 MCG/SPRAY nasal spray Place 1 spray into both nostrils 2 (two) times daily. Dose is for each nostril. 25 g 12  . beclomethasone (QVAR) 80 MCG/ACT inhaler Inhale 2 puffs into the lungs 2 (two) times daily. 1 Inhaler 12  . cetirizine (ZYRTEC) 10 MG tablet TAKE ONE TABLET BY MOUTH EVERY DAY 30 tablet 0  . furosemide (LASIX) 20 MG tablet Take 1 tablet (20 mg total) by mouth daily. 30 tablet 5  . GENVOYA 150-150-200-10 MG TABS tablet TAKE 1 TABLET BY MOUTH EVERY DAY WITH BREAKFAST 30 tablet 3  . loratadine (CLARITIN) 10 MG tablet Take 1 tablet (10 mg total) by mouth daily. 30 tablet 4  . Multiple Vitamin (MULTI-VITAMINS) TABS TAKE ONE TABLET BY MOUTH EVERY DAY 30 tablet 0  . naphazoline-pheniramine (NAPHCON-A) 0.025-0.3 % ophthalmic solution Place 1 drop into both eyes 4 (four) times daily as needed for irritation. 15 mL 0  . omeprazole (PRILOSEC) 40 MG capsule TAKE ONE CAPSULE BY MOUTH EVERY DAY 30 capsule 0   No current facility-administered medications for this visit.     Objective: Office vital signs reviewed. BP (!) 142/98   Pulse 86   Temp 98.6 F (37 C) (Oral)   Ht 5\' 3"  (1.6 m)   Wt 217 lb (98.4 kg)   LMP 01/08/2017   SpO2 91%   BMI 38.44 kg/m    Physical Examination:  General: Awake, alert, well- nourished, NAD ENMT:  Difficult exam,  but appears to have some bulging of the left TM, does not appear intact. No erythema. No tenderness. Unable to visualize right TM.  Nasal turbinates moist with clear drainage.  MMM, Oropharynx clear without erythema or tonsillar exudate/hypertrophy Eyes: Conjunctiva non-injected, watery. PERRL. No photophobia. Cardio: RRR, no m/r/g noted.  Pulm: No increased WOB.  CTAB, without wheezes, rhonchi or crackles noted.   A1c 6.4  Assessment/Plan: Allergic rhinitis Exam  most consistent with allergic rhinitis. No infectious symptoms. No symptoms/exam findings concerning for glaucoma. Denies vision change.  - Rx for Naphcon-A for ocular irritation - no flonase as it can interact with her HIV medication, advised her to start beclomethasone nasal spray. - continue anti-histamine  Diabetes (HCC) A1c at goal at 6.4  OTITIS MEDIA, CHRONIC Difficult to visualize R TM, left TM suggestive of chronic OM without evidence of current infection. - referral to ENT   Orders Placed This Encounter  Procedures  . Ambulatory referral to ENT    Referral Priority:   Routine    Referral Type:   Consultation    Referral Reason:   Specialty Services Required    Requested Specialty:   Otolaryngology    Number of Visits Requested:   1  . POCT HgB A1C (CPT 83036)    Meds ordered this encounter  Medications  . naphazoline-pheniramine (NAPHCON-A) 0.025-0.3 % ophthalmic solution    Sig: Place 1 drop into both eyes 4 (four) times daily as needed for irritation.    Dispense:  15 mL    Refill:  0  . DISCONTD: fluticasone (FLONASE) 50 MCG/ACT nasal spray    Sig: Place 2 sprays into both nostrils daily.    Dispense:  16 g    Refill:  0  . beclomethasone (BECONASE-AQ) 42 MCG/SPRAY nasal spray    Sig: Place 1 spray into both nostrils 2 (two) times daily. Dose is for each nostril.    Dispense:  25 g    Refill:  12    Joanna Puff PGY-3, [facility]

## 2017-02-09 ENCOUNTER — Ambulatory Visit: Payer: Medicaid Other | Admitting: Internal Medicine

## 2017-02-15 ENCOUNTER — Other Ambulatory Visit: Payer: Self-pay | Admitting: Internal Medicine

## 2017-02-15 ENCOUNTER — Other Ambulatory Visit: Payer: Self-pay | Admitting: Family Medicine

## 2017-02-15 DIAGNOSIS — B20 Human immunodeficiency virus [HIV] disease: Secondary | ICD-10-CM

## 2017-02-24 DIAGNOSIS — H903 Sensorineural hearing loss, bilateral: Secondary | ICD-10-CM | POA: Insufficient documentation

## 2017-02-24 DIAGNOSIS — H9313 Tinnitus, bilateral: Secondary | ICD-10-CM | POA: Insufficient documentation

## 2017-03-15 ENCOUNTER — Other Ambulatory Visit: Payer: Self-pay | Admitting: Family Medicine

## 2017-03-30 ENCOUNTER — Other Ambulatory Visit (HOSPITAL_COMMUNITY)
Admission: RE | Admit: 2017-03-30 | Discharge: 2017-03-30 | Disposition: A | Discharge status: Home / Self Care | Payer: Medicaid Other | Source: Ambulatory Visit | Attending: Internal Medicine | Admitting: Internal Medicine

## 2017-03-30 ENCOUNTER — Encounter: Payer: Self-pay | Admitting: Internal Medicine

## 2017-03-30 ENCOUNTER — Ambulatory Visit (INDEPENDENT_AMBULATORY_CARE_PROVIDER_SITE_OTHER): Payer: Medicaid Other | Admitting: Internal Medicine

## 2017-03-30 VITALS — BP 156/91 | HR 74 | Temp 98.7°F | Ht 63.0 in | Wt 219.0 lb

## 2017-03-30 DIAGNOSIS — J301 Allergic rhinitis due to pollen: Secondary | ICD-10-CM | POA: Diagnosis not present

## 2017-03-30 DIAGNOSIS — I10 Essential (primary) hypertension: Secondary | ICD-10-CM

## 2017-03-30 DIAGNOSIS — B20 Human immunodeficiency virus [HIV] disease: Secondary | ICD-10-CM | POA: Diagnosis not present

## 2017-03-30 LAB — COMPLETE METABOLIC PANEL WITH GFR
ALT: 13 U/L (ref 6–29)
AST: 17 U/L (ref 10–35)
Albumin: 4.3 g/dL (ref 3.6–5.1)
Alkaline Phosphatase: 58 U/L (ref 33–115)
BUN: 8 mg/dL (ref 7–25)
CO2: 30 mmol/L (ref 20–31)
Calcium: 10 mg/dL (ref 8.6–10.2)
Chloride: 97 mmol/L — ABNORMAL LOW (ref 98–110)
Creat: 0.78 mg/dL (ref 0.50–1.10)
GFR, Est African American: >89 mL/min (ref >=60)
GLUCOSE: 85 mg/dL (ref 65–99)
POTASSIUM: 4.1 mmol/L (ref 3.5–5.3)
SODIUM: 139 mmol/L (ref 135–146)
Total Bilirubin: 0.4 mg/dL (ref 0.2–1.2)
Total Protein: 8.1 g/dL (ref 6.1–8.1)

## 2017-03-30 LAB — LIPID PANEL
CHOL/HDL RATIO: 2.9 ratio (ref <5.0)
Cholesterol: 203 mg/dL — ABNORMAL HIGH (ref <200)
HDL: 70 mg/dL (ref >50)
LDL CALC: 105 mg/dL — AB (ref <100)
Triglycerides: 140 mg/dL (ref <150)
VLDL: 28 mg/dL (ref <30)

## 2017-03-30 LAB — CBC WITH DIFFERENTIAL/PLATELET
BASOS ABS: 47 {cells}/uL (ref 0–200)
Basophils Relative: 1 %
EOS PCT: 3 %
Eosinophils Absolute: 141 cells/uL (ref 15–500)
HCT: 39.5 % (ref 35.0–45.0)
Hemoglobin: 12.8 g/dL (ref 11.7–15.5)
Lymphocytes Relative: 48 %
Lymphs Abs: 2256 cells/uL (ref 850–3900)
MCH: 29.6 pg (ref 27.0–33.0)
MCHC: 32.4 g/dL (ref 32.0–36.0)
MCV: 91.2 fL (ref 80.0–100.0)
MONOS PCT: 11 %
MPV: 9.5 fL (ref 7.5–12.5)
Monocytes Absolute: 517 cells/uL (ref 200–950)
NEUTROS ABS: 1739 {cells}/uL (ref 1500–7800)
NEUTROS PCT: 37 %
PLATELETS: 345 10*3/uL (ref 140–400)
RBC: 4.33 MIL/uL (ref 3.80–5.10)
RDW: 13.8 % (ref 11.0–15.0)
WBC: 4.7 10*3/uL (ref 3.8–10.8)

## 2017-03-30 NOTE — Progress Notes (Signed)
RFV: hiv disease follow up  Patient ID: Leslie Gates, female   DOB: Sep 27, 1966, 51 y.o.   MRN: [id number]  HPI 51yo F with HIV disease, CD 4 count of 570/VL<20 (aug 2017), currently on genvoya. Having seasonal allergies and being placed on steroid inhalers. She states that she also takes zyrtec to help with symptoms. Otherwise she is doing well with her health.  I have revieweed her records in [facility].  Outpatient Encounter Prescriptions as of 03/30/2017  Medication Sig  . albuterol (PROAIR HFA) 108 (90 BASE) MCG/ACT inhaler Inhale 1-2 puffs into the lungs every 6 (six) hours as needed for wheezing or shortness of breath.  Marland Kitchen. amLODipine (NORVASC) 10 MG tablet Take 1 tablet (10 mg total) by mouth daily.  . beclomethasone (BECONASE-AQ) 42 MCG/SPRAY nasal spray Place 1 spray into both nostrils 2 (two) times daily. Dose is for each nostril.  . beclomethasone (QVAR) 80 MCG/ACT inhaler Inhale 2 puffs into the lungs 2 (two) times daily.  . cetirizine (ZYRTEC) 10 MG tablet TAKE ONE TABLET BY MOUTH EVERY DAY  . furosemide (LASIX) 20 MG tablet Take 1 tablet (20 mg total) by mouth daily.  . GENVOYA 150-150-200-10 MG TABS tablet TAKE 1 TABLET BY MOUTH EVERY DAY WITH BREAKFAST  . loratadine (CLARITIN) 10 MG tablet Take 1 tablet (10 mg total) by mouth daily.  . Multiple Vitamin (MULTI-VITAMINS) TABS TAKE ONE TABLET BY MOUTH EVERY DAY  . NAPHCON-A 0.025-0.3 % ophthalmic solution INSTILL 1 DROP INTO BOTH EYES FOUR TIMES A DAY AS NEEDED FOR IRRITATION.  Marland Kitchen. omeprazole (PRILOSEC) 40 MG capsule TAKE ONE CAPSULE BY MOUTH EVERY DAY   No facility-administered encounter medications on file as of 03/30/2017.      Patient Active Problem List   Diagnosis Date Noted  . Diabetes (HCC) 04/16/2016  . Diarrhea 04/16/2016  . Dyspnea 04/08/2015  . Excessive ear wax 03/01/2015  . Knee pain, acute 10/23/2014  . Moderate dysplasia of cervix 07/10/2013  . Chemical conjunctivitis 09/22/2012  . Angioedema of  lips 09/09/2011  . Rash 08/12/2011  . Acute bronchitis 04/10/2011  . Obesity hypoventilation syndrome (HCC) 07/10/2010  . Obesity (BMI 30.0-34.9) 07/01/2010  . Obstructive sleep apnea 06/27/2010  . HYPERTENSION, PULMONARY 05/28/2010  . SHORTNESS OF BREATH 05/08/2010  . Allergic rhinitis 03/14/2008  . DYSPLASIA OF CERVIX UNSPECIFIED 12/19/2007  . HERPES ZOSTER 09/15/2007  . OTITIS MEDIA, CHRONIC 03/22/2007  . HYPERTENSION 03/22/2007  . HIV DISEASE 06/21/2002     Health Maintenance Due  Topic Date Due  . FOOT EXAM  07/05/1977  . OPHTHALMOLOGY EXAM  07/05/1977  . URINE MICROALBUMIN  07/05/1977  . TETANUS/TDAP  07/05/1986    Social History  Substance Use Topics  . Smoking status: Never Smoker  . Smokeless tobacco: Never Used  . Alcohol use No    Review of Systems Review of Systems  Constitutional: Negative for fever, chills, diaphoresis, activity change, appetite change, fatigue and unexpected weight change.  HENT: Negative for congestion, sore throat, rhinorrhea, sneezing, trouble swallowing and sinus pressure.  Eyes: Negative for photophobia and visual disturbance.  Respiratory: Negative for cough, chest tightness, shortness of breath, wheezing and stridor.  Cardiovascular: Negative for chest pain, palpitations and leg swelling.  Gastrointestinal: Negative for nausea, vomiting, abdominal pain, diarrhea, constipation, blood in stool, abdominal distention and anal bleeding.  Genitourinary: Negative for dysuria, hematuria, flank pain and difficulty urinating.  Musculoskeletal: Negative for myalgias, back pain, joint swelling, arthralgias and gait problem.  Skin: Negative for color change,  pallor, rash and wound.  Neurological: Negative for dizziness, tremors, weakness and light-headedness.  Hematological: Negative for adenopathy. Does not bruise/bleed easily.  Psychiatric/Behavioral: Negative for behavioral problems, confusion, sleep disturbance, dysphoric mood, decreased  concentration and agitation.    Physical Exam   BP (!) 156/91   Pulse 74   Temp 98.7 F (37.1 C) (Oral)   Ht 5\' 3"  (1.6 m)   Wt 219 lb (99.3 kg)   LMP 03/22/2017 (Exact Date)   BMI 38.79 kg/m   Physical Exam  Constitutional:  oriented to person, place, and time. appears well-developed and well-nourished. No distress.  HENT: /AT, PERRLA, no scleral icterus Mouth/Throat: Oropharynx is clear and moist. No oropharyngeal exudate.  Cardiovascular: Normal rate, regular rhythm and normal heart sounds. Exam reveals no gallop and no friction rub.  No murmur heard.  Pulmonary/Chest: Effort normal and breath sounds normal. No respiratory distress.  has no wheezes.  Neck = supple, no nuchal rigidity Abdominal: Soft. Bowel sounds are normal.  exhibits no distension. There is no tenderness.  Lymphadenopathy: no cervical adenopathy. No axillary adenopathy Neurological: alert and oriented to person, place, and time.  Skin: Skin is warm and dry. No rash noted. No erythema.  Psychiatric: a normal mood and affect.  behavior is normal.   Lab Results  Component Value Date   CD4TCELL 26 (L) 07/14/2016   Lab Results  Component Value Date   CD4TABS 570 07/14/2016   CD4TABS 500 01/16/2016   CD4TABS 470 09/09/2015   Lab Results  Component Value Date   HIV1RNAQUANT <20 07/14/2016   Lab Results  Component Value Date   HEPBSAB NEG 02/08/2009   Lab Results  Component Value Date   LABRPR NON REAC 07/14/2016    CBC Lab Results  Component Value Date   WBC 4.6 07/14/2016   RBC 4.23 07/14/2016   HGB 12.5 07/14/2016   HCT 37.6 07/14/2016   PLT 330 07/14/2016   MCV 88.9 07/14/2016   MCH 29.6 07/14/2016   MCHC 33.2 07/14/2016   RDW 14.1 07/14/2016   LYMPHSABS 2,116 07/14/2016   MONOABS 414 07/14/2016   EOSABS 322 07/14/2016    BMET Lab Results  Component Value Date   NA 137 07/14/2016   K 4.1 07/14/2016   CL 103 07/14/2016   CO2 28 07/14/2016   GLUCOSE 115 (H) 07/14/2016   BUN  11 07/14/2016   CREATININE 0.82 07/14/2016   CALCIUM 9.7 07/14/2016   GFRNONAA 84 07/14/2016   GFRAA >89 07/14/2016      Assessment and Plan  hiv disease= well controlled in august 2017. Will repeat labs to see if still virologically controlled. Continue on genvoya but could change to biktarvy. Will discuss further at next appt  Hypertension = above goal but has not taken all her meds this am. Will follow up at next appt  Seasonal allergies = continue on zyrtec and inhalers as needed

## 2017-03-31 LAB — URINE CYTOLOGY ANCILLARY ONLY
Chlamydia: NEGATIVE
Neisseria Gonorrhea: NEGATIVE

## 2017-03-31 LAB — URINALYSIS
Bilirubin Urine: NEGATIVE
Glucose, UA: NEGATIVE
Hgb urine dipstick: NEGATIVE
KETONES UR: NEGATIVE
Leukocytes, UA: NEGATIVE
Nitrite: NEGATIVE
Protein, ur: NEGATIVE
SPECIFIC GRAVITY, URINE: 1.011 (ref 1.001–1.035)
pH: 6 (ref 5.0–8.0)

## 2017-03-31 LAB — T-HELPER CELL (CD4) - (RCID CLINIC ONLY)
CD4 T CELL HELPER: 27 % — AB (ref 33–55)
CD4 T Cell Abs: 670 /uL (ref 400–2700)

## 2017-03-31 LAB — RPR

## 2017-04-01 LAB — HIV-1 RNA QUANT-NO REFLEX-BLD
HIV 1 RNA Quant: 41 {copies}/mL — ABNORMAL HIGH
HIV-1 RNA Quant, Log: 1.61 {Log_copies}/mL — ABNORMAL HIGH

## 2017-04-14 ENCOUNTER — Other Ambulatory Visit: Payer: Self-pay | Admitting: Family Medicine

## 2017-04-28 ENCOUNTER — Other Ambulatory Visit: Payer: Self-pay | Admitting: Pulmonary Disease

## 2017-05-12 ENCOUNTER — Other Ambulatory Visit: Payer: Self-pay | Admitting: Family Medicine

## 2017-05-12 ENCOUNTER — Other Ambulatory Visit: Payer: Self-pay | Admitting: Pulmonary Disease

## 2017-06-01 ENCOUNTER — Telehealth: Payer: Self-pay | Admitting: Pulmonary Disease

## 2017-06-01 DIAGNOSIS — G4733 Obstructive sleep apnea (adult) (pediatric): Secondary | ICD-10-CM

## 2017-06-01 NOTE — Telephone Encounter (Signed)
Spoke with patient. She stated that she needs supplies for her CPAP machine. She last saw Dr. Craige CottaSood back in Jan 2018 and was advised to follow up in one year. Will send in an order for supplies, she uses [facility]. She verbalized understanding. Nothing further needed at time of call.

## 2017-06-01 NOTE — Telephone Encounter (Signed)
lmtcb X1 for pt  

## 2017-06-10 ENCOUNTER — Other Ambulatory Visit: Payer: Self-pay | Admitting: Pulmonary Disease

## 2017-06-11 ENCOUNTER — Other Ambulatory Visit: Payer: Self-pay | Admitting: *Deleted

## 2017-06-11 MED ORDER — CETIRIZINE HCL 10 MG PO TABS: 10.0000 mg | ORAL_TABLET | Freq: Every day | ORAL | 0 refills | Status: DC

## 2017-06-11 MED ORDER — MULTI-VITAMINS PO TABS: 1.0000 | ORAL_TABLET | Freq: Every day | ORAL | 0 refills | Status: DC

## 2017-06-11 MED ORDER — OMEPRAZOLE 40 MG PO CPDR: 40.0000 mg | DELAYED_RELEASE_CAPSULE | Freq: Every day | ORAL | 0 refills | Status: DC

## 2017-06-11 MED ORDER — NAPHAZOLINE-PHENIRAMINE 0.025-0.3 % OP SOLN: OPHTHALMIC | 0 refills | Status: DC

## 2017-06-21 ENCOUNTER — Telehealth (HOSPITAL_COMMUNITY): Payer: Self-pay | Admitting: Cardiology

## 2017-06-21 NOTE — Telephone Encounter (Signed)
Patient called to request appointment. Patient reports she has been having increased SOB and having difficult time walking up stairs for the past month   Last visit with CHF clinic x 3 years ago will need NP appt to reestablish care   [facility]

## 2017-06-24 NOTE — Telephone Encounter (Signed)
Patient scheduled for follow up on 8/10

## 2017-06-25 ENCOUNTER — Ambulatory Visit (HOSPITAL_COMMUNITY)
Admission: RE | Admit: 2017-06-25 | Discharge: 2017-06-25 | Disposition: A | Discharge status: Home / Self Care | Payer: Medicaid Other | Source: Ambulatory Visit | Attending: Internal Medicine | Admitting: Internal Medicine

## 2017-06-25 VITALS — BP 155/95 | HR 83 | Wt 220.2 lb

## 2017-06-25 DIAGNOSIS — E669 Obesity, unspecified: Secondary | ICD-10-CM | POA: Diagnosis not present

## 2017-06-25 DIAGNOSIS — I2721 Secondary pulmonary arterial hypertension: Secondary | ICD-10-CM | POA: Insufficient documentation

## 2017-06-25 DIAGNOSIS — R0600 Dyspnea, unspecified: Secondary | ICD-10-CM | POA: Diagnosis not present

## 2017-06-25 DIAGNOSIS — Z79899 Other long term (current) drug therapy: Secondary | ICD-10-CM | POA: Insufficient documentation

## 2017-06-25 DIAGNOSIS — B20 Human immunodeficiency virus [HIV] disease: Secondary | ICD-10-CM | POA: Diagnosis not present

## 2017-06-25 DIAGNOSIS — I110 Hypertensive heart disease with heart failure: Secondary | ICD-10-CM | POA: Insufficient documentation

## 2017-06-25 DIAGNOSIS — E662 Morbid (severe) obesity with alveolar hypoventilation: Secondary | ICD-10-CM | POA: Diagnosis not present

## 2017-06-25 DIAGNOSIS — I10 Essential (primary) hypertension: Secondary | ICD-10-CM

## 2017-06-25 DIAGNOSIS — I5032 Chronic diastolic (congestive) heart failure: Secondary | ICD-10-CM | POA: Insufficient documentation

## 2017-06-25 DIAGNOSIS — I2510 Atherosclerotic heart disease of native coronary artery without angina pectoris: Secondary | ICD-10-CM | POA: Insufficient documentation

## 2017-06-25 DIAGNOSIS — G4733 Obstructive sleep apnea (adult) (pediatric): Secondary | ICD-10-CM

## 2017-06-25 LAB — BASIC METABOLIC PANEL
Anion gap: 8 (ref 5–15)
BUN: 10 mg/dL (ref 6–20)
CALCIUM: 9.3 mg/dL (ref 8.9–10.3)
CHLORIDE: 101 mmol/L (ref 101–111)
CO2: 30 mmol/L (ref 22–32)
CREATININE: 0.71 mg/dL (ref 0.44–1.00)
GFR calc Af Amer: >60 mL/min (ref >60)
GFR calc non Af Amer: >60 mL/min (ref >60)
GLUCOSE: 116 mg/dL — AB (ref 65–99)
Potassium: 4 mmol/L (ref 3.5–5.1)
Sodium: 139 mmol/L (ref 135–145)

## 2017-06-25 LAB — CBC
HEMATOCRIT: 39.7 % (ref 36.0–46.0)
Hemoglobin: 12.8 g/dL (ref 12.0–15.0)
MCH: 29.7 pg (ref 26.0–34.0)
MCHC: 32.2 g/dL (ref 30.0–36.0)
MCV: 92.1 fL (ref 78.0–100.0)
PLATELETS: 296 10*3/uL (ref 150–400)
RBC: 4.31 MIL/uL (ref 3.87–5.11)
RDW: 13.4 % (ref 11.5–15.5)
WBC: 4.4 10*3/uL (ref 4.0–10.5)

## 2017-06-25 LAB — BRAIN NATRIURETIC PEPTIDE: B Natriuretic Peptide: 6.9 pg/mL (ref 0.0–100.0)

## 2017-06-25 MED ORDER — FUROSEMIDE 20 MG PO TABS
20.0000 mg | ORAL_TABLET | Freq: Every day | ORAL | 6 refills | Status: DC
Start: 2017-06-25 — End: 2018-04-29

## 2017-06-25 NOTE — Progress Notes (Addendum)
Patient ID: Leslie Gates, female   DOB: [birth year], 51 y.o.   MRN: [id number] Pulmonologist: Dr Marchelle Gearing ID: Dr Drue Second  HPI: Leslie Gates is a 51 y.o. female with history of obesity, hypertension, HIV, and obstructive sleep apnea who was found to have pulmonary arterial hypertension with RV dysfunction on echocardiogram.    RHC on August 2011: RA 21, RV pressure 78/39/21.  Pulmonary artery pressure 84/44 (60). PCWP 25-30.  Fick  4.2 L/2.3 Pulmonary vascular resistance was 7.1 Woods units.  She was started on spironolactone for her elevated L heart pressures but we also suggested the possibility of Adcirca as a pulmonary vasodilator .   She had a nuclear stress in January 2012 that was normal with EF 68%.  09/2011 EF 60 %.RV mildly dilated minimal hypokinesis. No TR jet.  02/01/12: RHC (improved) RA = 6  RV = 39/4/8  PA = 42/14 (25)  PCW = 9  Fick cardiac output/index = 6.2/3.2  PVR = 2.5 Woods  FA sat = 96%  PA sat = 72%, 73%  ECHO 08/22/13: EF 60-65%, normal diastolic function, mild RV dilation with normal RV systolic function, PA systolic pressure 29 mmHg.   Echo 03/2015 65-70%, Mild TI, PA peak pressure 35 mm Hg.   She presents today to re-establish with the CHF clinic.  Last seen in 09/2013 and was stable. She has been more SOB for the last 2 months. She ran out of her lasix around the same time. She has not contacted Dr. Craige Cotta office, and is unsure who had been prescribing her lasix. Coughing up white/yellow sputum. Denies fever or chills. + orthopnea, PND, and bendopnea. Weight up 30 lbs from our visit 4 years ago. She states her weight has gradually increased. Taking all medications as directed.   EKG: NSR 79 bpm, IRBBB  Review of systems complete and found to be negative unless listed in HPI.     Past Medical History:  Diagnosis Date  . Allergic rhinitis   . Chronic otitis media   . Coronary artery disease   . Diastolic CHF, chronic (HCC)   . Dysphagia   . History of  cervical dysplasia   . History of herpes zoster   . HTN (hypertension)   . Human immunodeficiency virus (HIV) (HCC) 2001  . Obesity   . OSA (obstructive sleep apnea)   . Pulmonary HTN (HCC)   . Wrist fracture, bilateral     Current Outpatient Prescriptions  Medication Sig Dispense Refill  . amLODipine (NORVASC) 10 MG tablet Take 1 tablet (10 mg total) by mouth daily. 30 tablet 11  . beclomethasone (BECONASE-AQ) 42 MCG/SPRAY nasal spray Place 1 spray into both nostrils 2 (two) times daily. Dose is for each nostril. 25 g 12  . beclomethasone (QVAR) 80 MCG/ACT inhaler Inhale 2 puffs into the lungs 2 (two) times daily. 1 Inhaler 12  . cetirizine (ZYRTEC) 10 MG tablet Take 1 tablet (10 mg total) by mouth daily. 30 tablet 0  . GENVOYA 150-150-200-10 MG TABS tablet TAKE 1 TABLET BY MOUTH EVERY DAY WITH BREAKFAST 30 tablet 5  . Multiple Vitamin (MULTI-VITAMINS) TABS Take 1 tablet by mouth daily. 30 tablet 0  . naphazoline-pheniramine (NAPHCON-A) 0.025-0.3 % ophthalmic solution INSTILL 1 DROP INTO BOTH EYES FOUR TIMES A DAY AS NEEDED FOR IRRITATION. 15 mL 0  . omeprazole (PRILOSEC) 40 MG capsule Take 1 capsule (40 mg total) by mouth daily. 30 capsule 0  . PROAIR HFA 108 (90 Base) MCG/ACT inhaler INHALE 1  OR 2 PUFFS INTO THE LUNGS EVERY 6 HOURS AS NEEDED WHEEZING OR SHORTNESS OF BREATH 8.5 g 0   No current facility-administered medications for this encounter.     Allergies  Allergen Reactions  . Lisinopril Swelling    Very swollen bottom lip   Vitals:   06/25/17 1037  BP: (!) 155/95  Pulse: 83  SpO2: 93%  Weight: 220 lb 4 oz (99.9 kg)   Wt Readings from Last 3 Encounters:  06/25/17 220 lb 4 oz (99.9 kg)  03/30/17 219 lb (99.3 kg)  01/29/17 217 lb (98.4 kg)     PHYSICAL EXAM:' General: Well appearing. No resp difficulty. HEENT: Normal Neck: Supple. JVP difficult to assess due to body habitus. Carotids 2+ bilat; no bruits. No thyromegaly or nodule noted. Cor: PMI nondisplaced.  RRR, No M/G/R noted Lungs: CTAB, normal effort. Abdomen: Soft, non-tender, non-distended, no HSM. No bruits or masses. +BS  Extremities: No cyanosis, clubbing, or rash. Trace ankle edema.   Neuro: Alert & orientedx3, cranial nerves grossly intact. moves all 4 extremities w/o difficulty. Affect pleasant   ASSESSMENT & PLAN:  1. Diastolic CHF - Echo 04/12/2015 LVEF 65-70%, PA peak pressure 35 mm Hg. Will repeat Echo. - She has been more short of breath since she ran out of lasix nearly two months ago.  - Take lasix 40 mg x 2 days, then return to previous chronic dose of 20 mg daily.  2. Pulmonary HTN  - Contributor to her dyspnea. Repeat Echo. If PA pressure up, may need RHC.  - 02 sat remained > 94% ambulating around clinic.  3. OSA/OHS - Continue nightly CPAP and 02 at home.  4. HIV  - Follows in HIV clinic.  5. HTN - Mildly elevated but pt very anxious about. - Will focus on diuresis for now.  6. Morbid obesity - Encouraged portion control and increased activity as tolerated.   Repeat Echo. Labs today. Restart lasix. RTC 1 month. Recommended she follow up with Pulmonary.   Graciella FreerMichael Andrew Tallon Gertz, PA-C  10:46 AM  Greater than 50% of the 30 minute visit was spent in counseling/coordination of care regarding disease state education, salt/fluid restriction, medication compliance, and discussion of medical regimen with on site Pharm-D.

## 2017-06-25 NOTE — Addendum Note (Signed)
Encounter addended by: Graciella Freerillery, Michael Andrew, PA-C on: 06/25/2017  1:10 PM<BR>    Actions taken: Sign clinical note

## 2017-06-25 NOTE — Progress Notes (Signed)
Advanced Heart Failure Medication Review by a Pharmacist  Does the patient  feel that his/her medications are working for him/her?  yes  Has the patient been experiencing any side effects to the medications prescribed?  no  Does the patient measure his/her own blood pressure or blood glucose at home?  no   Does the patient have any problems obtaining medications due to transportation or finances?   No - [place] Medicaid  Understanding of regimen: poor Understanding of indications: poor Potential of compliance: poor Patient understands to avoid NSAIDs. Patient understands to avoid decongestants.  Issues to address at subsequent visits: compliance   Pharmacist comments: Ms. Malen GauzeFoster is a 51 yo F presenting very SOB without a medication list and with poor understanding of her regimen. She reports not taking her lasix for over a month 2/2 not having a refill. She did not have any other medication-related questions or concerns for me at this time.   Tyler DeisErika K. Bonnye FavaNicolsen, PharmD, BCPS, CPP Clinical Pharmacist Pager: [phone] Phone: [phone] 06/25/2017 10:45 AM      Time with patient: 10 minutes Preparation and documentation time: 2 minutes Total time: 12 minutes

## 2017-06-25 NOTE — Patient Instructions (Signed)
Restart Lasix 20 mg tablet once daily. Take 40 mg (2 tabs) once daily today and tomorrow. Can take an extra 20 mg tablet once daiyl as needed for weight gain 3 lbs or more overnight.  [facility] Address: [address] ([address] Floor), [place], [place] [address]  Phone: [phone]  Routine lab work today. Will notify you of abnormal results, otherwise no news is good news!  Follow up 4 weeks with Otilio SaberAndy Tillery PA-C.  Take all medication as prescribed the day of your appointment. Bring all medications with you to your appointment.  Do the following things EVERYDAY: 1) Weigh yourself in the morning before breakfast. Write it down and keep it in a log. 2) Take your medicines as prescribed 3) Eat low salt foods-Limit salt (sodium) to 2000 mg per day.  4) Stay as active as you can everyday 5) Limit all fluids for the day to less than 2 liters

## 2017-06-29 ENCOUNTER — Other Ambulatory Visit (HOSPITAL_COMMUNITY): Payer: Medicaid Other

## 2017-07-07 ENCOUNTER — Other Ambulatory Visit: Payer: Self-pay | Admitting: Internal Medicine

## 2017-07-07 DIAGNOSIS — Z1231 Encounter for screening mammogram for malignant neoplasm of breast: Secondary | ICD-10-CM

## 2017-07-09 ENCOUNTER — Other Ambulatory Visit: Payer: Self-pay | Admitting: *Deleted

## 2017-07-09 ENCOUNTER — Other Ambulatory Visit: Payer: Self-pay | Admitting: Pulmonary Disease

## 2017-07-09 MED ORDER — NAPHAZOLINE-PHENIRAMINE 0.025-0.3 % OP SOLN: OPHTHALMIC | 0 refills | Status: DC

## 2017-07-09 MED ORDER — CETIRIZINE HCL 10 MG PO TABS: 10.0000 mg | ORAL_TABLET | Freq: Every day | ORAL | 0 refills | Status: DC

## 2017-07-09 MED ORDER — MULTI-VITAMINS PO TABS: 1.0000 | ORAL_TABLET | Freq: Every day | ORAL | 0 refills | Status: DC

## 2017-07-09 MED ORDER — OMEPRAZOLE 40 MG PO CPDR: 40.0000 mg | DELAYED_RELEASE_CAPSULE | Freq: Every day | ORAL | 0 refills | Status: DC

## 2017-07-15 ENCOUNTER — Ambulatory Visit: Payer: Medicaid Other

## 2017-07-16 ENCOUNTER — Ambulatory Visit: Payer: Medicaid Other

## 2017-07-23 ENCOUNTER — Encounter (HOSPITAL_COMMUNITY): Payer: Medicaid Other

## 2017-08-10 ENCOUNTER — Other Ambulatory Visit: Payer: Self-pay | Admitting: *Deleted

## 2017-08-10 ENCOUNTER — Other Ambulatory Visit: Payer: Self-pay | Admitting: Internal Medicine

## 2017-08-10 DIAGNOSIS — B20 Human immunodeficiency virus [HIV] disease: Secondary | ICD-10-CM

## 2017-08-10 MED ORDER — NAPHAZOLINE-PHENIRAMINE 0.025-0.3 % OP SOLN: OPHTHALMIC | 0 refills | Status: DC

## 2017-08-11 ENCOUNTER — Other Ambulatory Visit: Payer: Self-pay | Admitting: *Deleted

## 2017-08-11 MED ORDER — MULTI-VITAMINS PO TABS: 1.0000 | ORAL_TABLET | Freq: Every day | ORAL | 0 refills | Status: DC

## 2017-08-11 MED ORDER — CETIRIZINE HCL 10 MG PO TABS: 10.0000 mg | ORAL_TABLET | Freq: Every day | ORAL | 0 refills | Status: DC

## 2017-08-11 MED ORDER — OMEPRAZOLE 40 MG PO CPDR: 40.0000 mg | DELAYED_RELEASE_CAPSULE | Freq: Every day | ORAL | 0 refills | Status: DC

## 2017-08-19 ENCOUNTER — Emergency Department (HOSPITAL_COMMUNITY)
Admission: EM | Admit: 2017-08-19 | Discharge: 2017-08-19 | Disposition: A | Discharge status: Home / Self Care | Payer: No Typology Code available for payment source | Attending: Emergency Medicine | Admitting: Emergency Medicine

## 2017-08-19 ENCOUNTER — Encounter (HOSPITAL_COMMUNITY): Payer: Self-pay

## 2017-08-19 ENCOUNTER — Emergency Department (HOSPITAL_COMMUNITY): Payer: No Typology Code available for payment source

## 2017-08-19 DIAGNOSIS — M25532 Pain in left wrist: Secondary | ICD-10-CM | POA: Diagnosis present

## 2017-08-19 DIAGNOSIS — Z79899 Other long term (current) drug therapy: Secondary | ICD-10-CM | POA: Insufficient documentation

## 2017-08-19 DIAGNOSIS — M25562 Pain in left knee: Secondary | ICD-10-CM

## 2017-08-19 DIAGNOSIS — I110 Hypertensive heart disease with heart failure: Secondary | ICD-10-CM | POA: Insufficient documentation

## 2017-08-19 DIAGNOSIS — I5032 Chronic diastolic (congestive) heart failure: Secondary | ICD-10-CM | POA: Insufficient documentation

## 2017-08-19 DIAGNOSIS — E119 Type 2 diabetes mellitus without complications: Secondary | ICD-10-CM | POA: Diagnosis not present

## 2017-08-19 MED ORDER — ONDANSETRON 4 MG PO TBDP
4.0000 mg | ORAL_TABLET | Freq: Once | ORAL | Status: AC
  Administered 2017-08-19: 4 mg via ORAL
  Filled 2017-08-19: qty 1

## 2017-08-19 MED ORDER — METHOCARBAMOL 500 MG PO TABS: 500.0000 mg | ORAL_TABLET | Freq: Two times a day (BID) | ORAL | 0 refills | Status: DC

## 2017-08-19 MED ORDER — OXYCODONE-ACETAMINOPHEN 5-325 MG PO TABS
1.0000 | ORAL_TABLET | ORAL | Status: DC | PRN
Start: 2017-08-19 — End: 2017-08-19
  Administered 2017-08-19: 1 via ORAL

## 2017-08-19 MED ORDER — IBUPROFEN 400 MG PO TABS: 400.0000 mg | ORAL_TABLET | Freq: Four times a day (QID) | ORAL | 0 refills | Status: DC | PRN

## 2017-08-19 MED ORDER — OXYCODONE-ACETAMINOPHEN 5-325 MG PO TABS
ORAL_TABLET | ORAL | Status: AC
  Filled 2017-08-19: qty 1
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
## 2017-08-19 NOTE — ED Notes (Signed)
Patient transported to X-ray 
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
## 2017-08-19 NOTE — ED Notes (Signed)
Patient ambulated to restroom without difficulty.
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
## 2017-08-23 ENCOUNTER — Ambulatory Visit: Payer: Medicaid Other | Admitting: Family Medicine
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
## 2017-08-24 ENCOUNTER — Ambulatory Visit (INDEPENDENT_AMBULATORY_CARE_PROVIDER_SITE_OTHER): Payer: Medicaid Other | Admitting: Family Medicine

## 2017-08-24 ENCOUNTER — Encounter: Payer: Self-pay | Admitting: Family Medicine

## 2017-08-24 VITALS — BP 118/86 | HR 76 | Temp 98.1°F | Ht 63.0 in | Wt 223.8 lb

## 2017-08-24 DIAGNOSIS — M25532 Pain in left wrist: Secondary | ICD-10-CM | POA: Diagnosis present
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
## 2017-08-24 NOTE — Patient Instructions (Signed)
Thank you for coming in to see Leslie Gates today. Please see below to review our plan for today's visit.  1. Continue wearing your left wrist splint daily, all day for the next 2 weeks. You can take ibuprofen every 8 hours as needed for pain. 2. I have placed an order for you to get x-rays done at [facility] at the radiology department. Please go anywhere between the dates of 10/11-10/14 to get this x-ray. I will call you with the results. 3. If you continue to have pain that does not improve over the next 2 weeks, return to the clinic.  Please call the clinic at [phone] if your symptoms worsen or you have any concerns. It was my pleasure to see you. -- Durward Parcel, DO [facility], PGY-2

## 2017-08-25 ENCOUNTER — Ambulatory Visit: Payer: Medicaid Other

## 2017-08-25 ENCOUNTER — Telehealth (HOSPITAL_COMMUNITY): Payer: Self-pay | Admitting: Student
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
## 2017-09-08 ENCOUNTER — Other Ambulatory Visit: Payer: Self-pay | Admitting: Family Medicine

## 2017-09-24 ENCOUNTER — Ambulatory Visit: Payer: Medicaid Other

## 2017-09-27 ENCOUNTER — Other Ambulatory Visit: Payer: Self-pay | Admitting: Family Medicine

## 2017-09-28 ENCOUNTER — Other Ambulatory Visit: Payer: Self-pay | Admitting: Family Medicine

## 2017-11-04 ENCOUNTER — Other Ambulatory Visit: Payer: Self-pay | Admitting: Family Medicine

## 2017-12-09 ENCOUNTER — Other Ambulatory Visit: Payer: Self-pay | Admitting: Family Medicine

## 2017-12-14 ENCOUNTER — Other Ambulatory Visit: Payer: Medicaid Other

## 2017-12-14 ENCOUNTER — Encounter: Payer: Self-pay | Admitting: *Deleted

## 2017-12-15 ENCOUNTER — Other Ambulatory Visit: Payer: Medicaid Other

## 2017-12-20 ENCOUNTER — Encounter (HOSPITAL_COMMUNITY): Payer: Medicaid Other

## 2017-12-20 ENCOUNTER — Other Ambulatory Visit: Payer: Medicaid Other

## 2017-12-20 DIAGNOSIS — B20 Human immunodeficiency virus [HIV] disease: Secondary | ICD-10-CM

## 2017-12-21 LAB — COMPREHENSIVE METABOLIC PANEL
AG RATIO: 1.2 (calc) (ref 1.0–2.5)
ALKALINE PHOSPHATASE (APISO): 62 U/L (ref 33–130)
ALT: 16 U/L (ref 6–29)
AST: 21 U/L (ref 10–35)
Albumin: 4.3 g/dL (ref 3.6–5.1)
BILIRUBIN TOTAL: 0.2 mg/dL (ref 0.2–1.2)
BUN: 12 mg/dL (ref 7–25)
CALCIUM: 9.9 mg/dL (ref 8.6–10.4)
CHLORIDE: 100 mmol/L (ref 98–110)
CO2: 34 mmol/L — AB (ref 20–32)
Creat: 0.77 mg/dL (ref 0.50–1.05)
GLOBULIN: 3.7 g/dL (ref 1.9–3.7)
Glucose, Bld: 119 mg/dL — ABNORMAL HIGH (ref 65–99)
Potassium: 4.3 mmol/L (ref 3.5–5.3)
Sodium: 141 mmol/L (ref 135–146)
Total Protein: 8 g/dL (ref 6.1–8.1)

## 2017-12-21 LAB — CBC WITH DIFFERENTIAL/PLATELET
BASOS PCT: 1 %
Basophils Absolute: 49 cells/uL (ref 0–200)
EOS ABS: 181 {cells}/uL (ref 15–500)
Eosinophils Relative: 3.7 %
HCT: 38.8 % (ref 35.0–45.0)
HEMOGLOBIN: 13.1 g/dL (ref 11.7–15.5)
LYMPHS ABS: 2176 {cells}/uL (ref 850–3900)
MCH: 29.8 pg (ref 27.0–33.0)
MCHC: 33.8 g/dL (ref 32.0–36.0)
MCV: 88.2 fL (ref 80.0–100.0)
MPV: 10.3 fL (ref 7.5–12.5)
Monocytes Relative: 7.7 %
NEUTROS ABS: 2117 {cells}/uL (ref 1500–7800)
Neutrophils Relative %: 43.2 %
Platelets: 305 10*3/uL (ref 140–400)
RBC: 4.4 10*6/uL (ref 3.80–5.10)
RDW: 12.9 % (ref 11.0–15.0)
Total Lymphocyte: 44.4 %
WBC: 4.9 10*3/uL (ref 3.8–10.8)
WBCMIX: 377 {cells}/uL (ref 200–950)

## 2017-12-21 LAB — T-HELPER CELL (CD4) - (RCID CLINIC ONLY)
CD4 % Helper T Cell: 31 % — ABNORMAL LOW (ref 33–55)
CD4 T CELL ABS: 760 /uL (ref 400–2700)

## 2017-12-22 LAB — HIV-1 RNA QUANT-NO REFLEX-BLD
HIV 1 RNA Quant: 25 copies/mL — ABNORMAL HIGH
HIV-1 RNA Quant, Log: 1.4 Log copies/mL — ABNORMAL HIGH

## 2017-12-29 ENCOUNTER — Ambulatory Visit (INDEPENDENT_AMBULATORY_CARE_PROVIDER_SITE_OTHER): Payer: Medicaid Other | Admitting: Internal Medicine

## 2017-12-29 ENCOUNTER — Encounter: Payer: Self-pay | Admitting: Internal Medicine

## 2017-12-29 VITALS — BP 139/84 | HR 86 | Temp 98.7°F | Wt 228.0 lb

## 2017-12-29 DIAGNOSIS — R0600 Dyspnea, unspecified: Secondary | ICD-10-CM

## 2017-12-29 DIAGNOSIS — B20 Human immunodeficiency virus [HIV] disease: Secondary | ICD-10-CM | POA: Diagnosis not present

## 2017-12-29 DIAGNOSIS — R0601 Orthopnea: Secondary | ICD-10-CM

## 2017-12-29 MED ORDER — BICTEGRAVIR-EMTRICITAB-TENOFOV 50-200-25 MG PO TABS
1.0000 | ORAL_TABLET | Freq: Every day | ORAL | 11 refills | Status: DC
Start: 2017-12-29 — End: 2018-03-24

## 2017-12-29 NOTE — Progress Notes (Signed)
RFV: follow up for hiv disease  Patient ID: Leslie Gates, female   DOB: [birth year], 52 y.o.   MRN: [id number]  HPI  52yo F with hiv disease, CD 4 count 760/VL 24, on genvoya. She notices being increasingly short of breath for many months. She has hx of diastolic heart failure, last seen in CHF clinic in Augu 2018 and restarted on lasix 20mg  daily. In our clinic today, her initial pulse ox was 89%. She states she becomes increasingly short of breath with ambulation. Has 3 pillow orthopnea but she thinks it maybe due in part to weight gain. She states she notices some relief with inhalers.she wears her cpap at night, and continues to take lasix 20mg  daily  After albuterol nebulizer, pulse ox up to 94% in clinic   Outpatient Encounter Medications as of 12/29/2017  Medication Sig  . amLODipine (NORVASC) 10 MG tablet Take 1 tablet (10 mg total) by mouth daily.  . beclomethasone (BECONASE-AQ) 42 MCG/SPRAY nasal spray Place 1 spray into both nostrils 2 (two) times daily. Dose is for each nostril.  . beclomethasone (QVAR) 80 MCG/ACT inhaler Inhale 2 puffs into the lungs 2 (two) times daily.  . cetirizine (ZYRTEC) 10 MG tablet TAKE 1 TABLET BY MOUTH DAILY  . furosemide (LASIX) 20 MG tablet Take 1 tablet (20 mg total) by mouth daily. May take extra tablet once daily as needed for weight gain 3 lbs or more overnight.  . GENVOYA 150-150-200-10 MG TABS tablet TAKE 1 TABLET BY MOUTH EVERY DAY WITH BREAKFAST  . ibuprofen (ADVIL,MOTRIN) 400 MG tablet Take 1 tablet (400 mg total) by mouth every 6 (six) hours as needed.  . methocarbamol (ROBAXIN) 500 MG tablet Take 1 tablet (500 mg total) by mouth 2 (two) times daily.  . Multiple Vitamin (MULTI-VITAMINS) TABS TAKE 1 TABLET BY MOUTH DAILY  . naphazoline-pheniramine (NAPHCON-A) 0.025-0.3 % ophthalmic solution INSTILL 1 DROP INTO BOTH EYES FOUR TIMES A DAY AS NEEDED FOR IRRITATION.  Marland Kitchen. omeprazole (PRILOSEC) 40 MG capsule TAKE 1 CAPSULE BY MOUTH DAILY  .  PROAIR HFA 108 (90 Base) MCG/ACT inhaler INHALE 1 OR 2 PUFFS INTO THE LUNGS EVERY 6 HOURS AS NEEDED WHEEZING OR SHORTNESS OF BREATH   No facility-administered encounter medications on file as of 12/29/2017.      Patient Active Problem List   Diagnosis Date Noted  . Left wrist pain 08/24/2017  . Chronic diastolic heart failure (HCC) 06/25/2017  . Prediabetes 04/16/2016  . Moderate dysplasia of cervix 07/10/2013  . History of angioedema 09/09/2011  . Obesity hypoventilation syndrome (HCC) 07/10/2010  . Morbid obesity (HCC) 07/01/2010  . Obstructive sleep apnea 06/27/2010  . Pulmonary hypertension (HCC) 05/28/2010  . Allergic rhinitis 03/14/2008  . Chronic otitis media 03/22/2007  . Primary hypertension 03/22/2007  . HIV disease (HCC) 06/21/2002     Health Maintenance Due  Topic Date Due  . FOOT EXAM  07/05/1977  . OPHTHALMOLOGY EXAM  07/05/1977  . URINE MICROALBUMIN  07/05/1977  . TETANUS/TDAP  07/05/1986  . INFLUENZA VACCINE  06/16/2017  . COLONOSCOPY  07/05/2017  . HEMOGLOBIN A1C  08/01/2017     Review of Systems Per hpi otherwise 12 point ros is negative Physical Exam   BP 139/84   Pulse 86   Temp 98.7 F (37.1 C) (Oral)   Wt 228 lb (103.4 kg)   LMP 12/13/2017   SpO2 (!) 89%   BMI 40.39 kg/m   Physical Exam  Constitutional:  oriented to person, place, and time. appears  well-developed and well-nourished. No distress.  HENT: [place]/AT, PERRLA, no scleral icterus Mouth/Throat: Oropharynx is clear and moist. No oropharyngeal exudate.  Cardiovascular: Normal rate, regular rhythm and normal heart sounds. Exam reveals no gallop and no friction rub.  No murmur heard.  Pulmonary/Chest: Effort normal and breath sounds normal. No respiratory distress.  Faint expiratory wheeze at base Neck = supple, no nuchal rigidity Abdominal: Soft. Bowel sounds are normal.  exhibits no distension. There is no tenderness.  Lymphadenopathy: no cervical adenopathy. No axillary  adenopathy Neurological: alert and oriented to person, place, and time.  Skin: Skin is warm and dry. No rash noted. No erythema.  Psychiatric: a normal mood and affect.  behavior is normal.   Lab Results  Component Value Date   CD4TCELL 31 (L) 12/20/2017   Lab Results  Component Value Date   CD4TABS 760 12/20/2017   CD4TABS 670 03/30/2017   CD4TABS 570 07/14/2016   Lab Results  Component Value Date   HIV1RNAQUANT 25 (H) 12/20/2017   Lab Results  Component Value Date   HEPBSAB NEG 02/08/2009   Lab Results  Component Value Date   LABRPR NON REAC 03/30/2017    CBC Lab Results  Component Value Date   WBC 4.9 12/20/2017   RBC 4.40 12/20/2017   HGB 13.1 12/20/2017   HCT 38.8 12/20/2017   PLT 305 12/20/2017   MCV 88.2 12/20/2017   MCH 29.8 12/20/2017   MCHC 33.8 12/20/2017   RDW 12.9 12/20/2017   LYMPHSABS 2,176 12/20/2017   MONOABS 517 03/30/2017   EOSABS 181 12/20/2017    BMET Lab Results  Component Value Date   NA 141 12/20/2017   K 4.3 12/20/2017   CL 100 12/20/2017   CO2 34 (H) 12/20/2017   GLUCOSE 119 (H) 12/20/2017   BUN 12 12/20/2017   CREATININE 0.77 12/20/2017   CALCIUM 9.9 12/20/2017   GFRNONAA >60 06/25/2017   GFRAA >60 06/25/2017      Assessment and Plan HIV disease = well controlled currently on genvoya, but will switch to biktarvy to minimize risk of drug reaction with some of her steroid inhalers  Dyspnea = - will do albuterol treatment to see if any improvement. Though her exam is not extensively wheezy. She is not using her albuterol inhaler frequently. Asked her to use albulterol more frequently if short of breath -sees dr Craige Cotta on march 3rd - recommend to use inhaler Q6hr to see improvement  Orthopnea = likely multifactorial. -in part could be related to increase weight gain/central obesity.  - will need to get appt back CHF clinic, though when she was last seen they too referred her back to pulmonary work up did not suggest worsening  diastolic chf and then to see dr Craige Cotta in pulmonary before getting back to cardiology

## 2017-12-30 ENCOUNTER — Encounter: Payer: Self-pay | Admitting: Obstetrics

## 2017-12-30 ENCOUNTER — Other Ambulatory Visit (HOSPITAL_COMMUNITY)
Admission: RE | Admit: 2017-12-30 | Discharge: 2017-12-30 | Disposition: A | Discharge status: Home / Self Care | Payer: Medicaid Other | Source: Ambulatory Visit | Attending: Obstetrics | Admitting: Obstetrics

## 2017-12-30 ENCOUNTER — Ambulatory Visit (INDEPENDENT_AMBULATORY_CARE_PROVIDER_SITE_OTHER): Payer: Medicaid Other | Admitting: Obstetrics

## 2017-12-30 VITALS — BP 158/87 | HR 75 | Ht 63.0 in | Wt 226.0 lb

## 2017-12-30 DIAGNOSIS — R87612 Low grade squamous intraepithelial lesion on cytologic smear of cervix (LGSIL): Secondary | ICD-10-CM | POA: Diagnosis not present

## 2017-12-30 DIAGNOSIS — Z6841 Body Mass Index (BMI) 40.0 and over, adult: Secondary | ICD-10-CM | POA: Diagnosis not present

## 2017-12-30 DIAGNOSIS — R87610 Atypical squamous cells of undetermined significance on cytologic smear of cervix (ASC-US): Secondary | ICD-10-CM | POA: Insufficient documentation

## 2017-12-30 DIAGNOSIS — Z113 Encounter for screening for infections with a predominantly sexual mode of transmission: Secondary | ICD-10-CM

## 2017-12-30 DIAGNOSIS — Z0001 Encounter for general adult medical examination with abnormal findings: Secondary | ICD-10-CM | POA: Diagnosis not present

## 2017-12-30 DIAGNOSIS — R87810 Cervical high risk human papillomavirus (HPV) DNA test positive: Secondary | ICD-10-CM | POA: Insufficient documentation

## 2017-12-30 DIAGNOSIS — Z1239 Encounter for other screening for malignant neoplasm of breast: Secondary | ICD-10-CM

## 2017-12-30 DIAGNOSIS — I10 Essential (primary) hypertension: Secondary | ICD-10-CM | POA: Diagnosis not present

## 2017-12-30 DIAGNOSIS — Z01419 Encounter for gynecological examination (general) (routine) without abnormal findings: Secondary | ICD-10-CM

## 2017-12-30 NOTE — Progress Notes (Signed)
Subjective:        Leslie Gates is a 52 y.o. female here for a routine exam.  Current complaints: nONE.    Personal health questionnaire:  Is patient [name], have a family history of breast and/or ovarian cancer: no Is there a family history of uterine cancer diagnosed at age < 40, gastrointestinal cancer, urinary tract cancer, family member who is a Personnel officer syndrome-associated carrier: no Is the patient overweight and hypertensive, family history of diabetes, personal history of gestational diabetes, preeclampsia or PCOS: no Is patient over 80, have PCOS,  family history of premature CHD under age 64, diabetes, smoke, have hypertension or peripheral artery disease:  no At any time, has a partner hit, kicked or otherwise hurt or frightened you?: no Over the past 2 weeks, have you felt down, depressed or hopeless?: no Over the past 2 weeks, have you felt little interest or pleasure in doing things?:no   Gynecologic History Patient's last menstrual period was 12/13/2017 (exact date). Contraception: tubal ligation Last Pap: 2017. Results were: LGSIL with positive HPV Last mammogram: 2017. Results were: normal  Obstetric History OB History  Gravida Para Term Preterm AB Living  3         2  SAB TAB Ectopic Multiple Live Births               # Outcome Date GA Lbr Len/2nd Weight Sex Delivery Anes PTL Lv  3 Gravida           2 Gravida           1 [place]               Past Medical History:  Diagnosis Date  . Allergic rhinitis   . Chronic otitis media   . Coronary artery disease   . Diastolic CHF, chronic (HCC)   . Dysphagia   . History of cervical dysplasia   . History of herpes zoster   . HTN (hypertension)   . Human immunodeficiency virus (HIV) (HCC) 2001  . Obesity   . OSA (obstructive sleep apnea)   . Pulmonary HTN (HCC)   . Wrist fracture, bilateral     Past Surgical History:  Procedure Laterality Date  . ADENOIDECTOMY    . CESAREAN SECTION    .  NASAL SINUS SURGERY  2004   [place]  . RIGHT HEART CATHETERIZATION N/A 02/01/2012   Procedure: RIGHT HEART CATH;  Surgeon: Dolores Patty, MD;  Location[facility] CATH LAB;  Service: Cardiovascular;  Laterality: N/A;  . TYMPANOSTOMY TUBE PLACEMENT     as a child  . WRIST FRACTURE SURGERY  2006   bilateral - [place]     Current Outpatient Medications:  .  amLODipine (NORVASC) 10 MG tablet, Take 1 tablet (10 mg total) by mouth daily., Disp: 30 tablet, Rfl: 11 .  beclomethasone (BECONASE-AQ) 42 MCG/SPRAY nasal spray, Place 1 spray into both nostrils 2 (two) times daily. Dose is for each nostril., Disp: 25 g, Rfl: 12 .  beclomethasone (QVAR) 80 MCG/ACT inhaler, Inhale 2 puffs into the lungs 2 (two) times daily., Disp: 1 Inhaler, Rfl: 12 .  bictegravir-emtricitabine-tenofovir AF (BIKTARVY) 50-200-25 MG TABS tablet, Take 1 tablet by mouth daily., Disp: 30 tablet, Rfl: 11 .  cetirizine (ZYRTEC) 10 MG tablet, TAKE 1 TABLET BY MOUTH DAILY, Disp: 30 tablet, Rfl: 0 .  furosemide (LASIX) 20 MG tablet, Take 1 tablet (20 mg total) by mouth daily. May take extra tablet once daily as needed for  weight gain 3 lbs or more overnight., Disp: 60 tablet, Rfl: 6 .  ibuprofen (ADVIL,MOTRIN) 400 MG tablet, Take 1 tablet (400 mg total) by mouth every 6 (six) hours as needed., Disp: 30 tablet, Rfl: 0 .  methocarbamol (ROBAXIN) 500 MG tablet, Take 1 tablet (500 mg total) by mouth 2 (two) times daily., Disp: 20 tablet, Rfl: 0 .  Multiple Vitamin (MULTI-VITAMINS) TABS, TAKE 1 TABLET BY MOUTH DAILY, Disp: 30 tablet, Rfl: 0 .  naphazoline-pheniramine (NAPHCON-A) 0.025-0.3 % ophthalmic solution, INSTILL 1 DROP INTO BOTH EYES FOUR TIMES A DAY AS NEEDED FOR IRRITATION., Disp: 15 mL, Rfl: 0 .  omeprazole (PRILOSEC) 40 MG capsule, TAKE 1 CAPSULE BY MOUTH DAILY, Disp: 30 capsule, Rfl: 0 .  PROAIR HFA 108 (90 Base) MCG/ACT inhaler, INHALE 1 OR 2 PUFFS INTO THE LUNGS EVERY 6 HOURS AS NEEDED WHEEZING OR SHORTNESS OF BREATH, Disp: 8.5 g,  Rfl: 5 Allergies  Allergen Reactions  . Lisinopril Swelling    Very swollen bottom lip    Social History   Tobacco Use  . Smoking status: Never Smoker  . Smokeless tobacco: Never Used  Substance Use Topics  . Alcohol use: No    Alcohol/week: 0.0 oz    Family History  Problem Relation Age of Onset  . Other Father        GSW  . Hypertension Father   . Kidney disease Father        was on dialysis prior to death  . Alcohol abuse Father        liver failure from alcohol use  . Other Mother [id number]       ESRD  . Hyperlipidemia Mother   . Hypertension Mother   . Alcohol abuse Mother   . Hypertension Unknown   . Hypertension Sister   . Alcohol abuse Sister   . Hypertension Brother   . Alcohol abuse Brother       Review of Systems  Constitutional: negative for fatigue and weight loss Respiratory: negative for cough and wheezing Cardiovascular: negative for chest pain, fatigue and palpitations Gastrointestinal: negative for abdominal pain and change in bowel habits Musculoskeletal:negative for myalgias Neurological: negative for gait problems and tremors Behavioral/Psych: negative for abusive relationship, depression Endocrine: negative for temperature intolerance    Genitourinary:negative for abnormal menstrual periods, genital lesions, hot flashes, sexual problems and vaginal discharge Integument/breast: negative for breast lump, breast tenderness, nipple discharge and skin lesion(s)    Objective:       BP (!) 158/87   Pulse 75   Ht 5\' 3"  (1.6 m)   Wt 226 lb (102.5 kg)   LMP 12/13/2017 (Exact Date)   BMI 40.03 kg/m  General:   alert  Skin:   no rash or abnormalities  Lungs:   clear to auscultation bilaterally  Heart:   regular rate and rhythm, S1, S2 normal, no murmur, click, rub or gallop  Breasts:   normal without suspicious masses, skin or nipple changes or axillary nodes  Abdomen:  normal findings: no organomegaly, soft, non-tender and no hernia  Pelvis:   External genitalia: normal general appearance Urinary system: urethral meatus normal and bladder without fullness, nontender Vaginal: normal without tenderness, induration or masses Cervix: normal appearance Adnexa: normal bimanual exam Uterus: anteverted and non-tender, normal size   Lab Review Urine pregnancy test Labs reviewed yes Radiologic studies reviewed yes  50% of 20 min visit spent on counseling and coordination of care.   Assessment:     1. Encounter for annual  routine gynecological examination  2. LGSIL on Pap smear of cervix Rx: - Cytology - PAP  3. Screening examination for STD (sexually transmitted disease) Rx: - Cervicovaginal ancillary only - Hepatitis B surface antigen - Hepatitis C antibody - HIV antibody - RPR  4. Class 3 severe obesity due to excess calories without serious comorbidity with body mass index (BMI) of 40.0 to 44.9 in adult [facility]) - recommended program of caloric reduction, exercise and behavioral modification  5. HTN (hypertension), benign - managed by PCP  6. Screening breast examination Rx: - MM DIGITAL SCREENING BILATERAL; Future    Plan:    Education reviewed: calcium supplements, depression evaluation, low fat, low cholesterol diet, safe sex/STD prevention, self breast exams and weight bearing exercise. Mammogram ordered. Follow up in: 1 year.    Orders Placed This Encounter  Procedures  . MM DIGITAL SCREENING BILATERAL    Standing Status:   Future    Standing Expiration Date:   02/28/2019    Order Specific Question:   Reason for Exam (SYMPTOM  OR DIAGNOSIS REQUIRED)    Answer:   Screening    Order Specific Question:   Is the patient pregnant?    Answer:   No    Order Specific Question:   Preferred imaging location?    Answer:   [facility]  . Hepatitis B surface antigen  . Hepatitis C antibody  . HIV antibody  . RPR    [name] MD

## 2017-12-30 NOTE — Progress Notes (Signed)
LMP: 12/13/2017 Mammogram: 08/24/207 WNL scheduled 01/05/18.  Last Pap: 09/30/2016 LSIL CIN1/ +HPV  Colonoscopy: N/A  Contraception: None  PCP: Durward Parcelavid McMullen MD   STD Screening:  Full panel  CC: NONE

## 2017-12-30 NOTE — Addendum Note (Signed)
Addended by: Tim LairLARK, Makai Dumond on: 12/30/2017 01:47 PM   Modules accepted: Orders

## 2017-12-31 LAB — CERVICOVAGINAL ANCILLARY ONLY
BACTERIAL VAGINITIS: NEGATIVE
Candida vaginitis: NEGATIVE
Chlamydia: NEGATIVE
Neisseria Gonorrhea: NEGATIVE
Trichomonas: NEGATIVE

## 2018-01-02 LAB — CYTOLOGY - PAP
Diagnosis: UNDETERMINED — AB
HPV: DETECTED — AB

## 2018-01-05 ENCOUNTER — Ambulatory Visit
Admission: RE | Admit: 2018-01-05 | Discharge: 2018-01-05 | Disposition: A | Discharge status: Home / Self Care | Payer: Medicaid Other | Source: Ambulatory Visit | Attending: Internal Medicine | Admitting: Internal Medicine

## 2018-01-05 DIAGNOSIS — Z1231 Encounter for screening mammogram for malignant neoplasm of breast: Secondary | ICD-10-CM | POA: Diagnosis not present

## 2018-01-13 ENCOUNTER — Other Ambulatory Visit: Payer: Self-pay | Admitting: Family Medicine

## 2018-02-07 ENCOUNTER — Ambulatory Visit (INDEPENDENT_AMBULATORY_CARE_PROVIDER_SITE_OTHER): Payer: Medicaid Other | Admitting: Pulmonary Disease

## 2018-02-07 ENCOUNTER — Encounter: Payer: Self-pay | Admitting: Pulmonary Disease

## 2018-02-07 VITALS — BP 136/84 | HR 70 | Ht 63.0 in | Wt 224.6 lb

## 2018-02-07 DIAGNOSIS — G4733 Obstructive sleep apnea (adult) (pediatric): Secondary | ICD-10-CM

## 2018-02-07 DIAGNOSIS — J45909 Unspecified asthma, uncomplicated: Secondary | ICD-10-CM | POA: Diagnosis not present

## 2018-02-07 DIAGNOSIS — Z9989 Dependence on other enabling machines and devices: Secondary | ICD-10-CM

## 2018-02-07 DIAGNOSIS — J9611 Chronic respiratory failure with hypoxia: Secondary | ICD-10-CM | POA: Diagnosis not present

## 2018-02-07 DIAGNOSIS — E662 Morbid (severe) obesity with alveolar hypoventilation: Secondary | ICD-10-CM

## 2018-02-07 MED ORDER — BUDESONIDE-FORMOTEROL FUMARATE 160-4.5 MCG/ACT IN AERO: 2.0000 | INHALATION_SPRAY | Freq: Two times a day (BID) | RESPIRATORY_TRACT | 6 refills | Status: DC

## 2018-02-07 MED ORDER — MONTELUKAST SODIUM 10 MG PO TABS: 10.0000 mg | ORAL_TABLET | Freq: Every day | ORAL | 5 refills | Status: DC

## 2018-02-07 NOTE — Patient Instructions (Signed)
Symbicort two puffs twice per day, and rinse mouth after each use Singulair 10 mg pill nightly Stop Qvar while using Symbicort  Follow up in 8 weeks with Dr. Craige CottaSood or Nurse Practitioner

## 2018-02-07 NOTE — Progress Notes (Signed)
[facility]  Chief Complaint  Patient presents with  . Follow-up    ROV, OSA on CPAP     Vital signs: BP 136/84 (BP Location: Left Arm, Cuff Size: Normal)   Pulse 70   Ht 5\' 3"  (1.6 m)   Wt 224 lb 9.6 oz (101.9 kg)   SpO2 99%   BMI 39.79 kg/m   History of Present Illness: Leslie Gates is a 52 y.o. female with OSA, OHS, asthma.  Her allergies have been terrible.  She is getting itchy eyes, sinus congestion, post nasal drip, wheeze, cough, and clear sputum.  She gets eczema on her elbows.    She uses CPAP and oxygen nightly.  No issue with mask fit.  Physical Exam:  General - pleasant Eyes - pupils reactive ENT - no sinus tenderness, no oral exudate, no LAN, boggy nasal mucosa, clear nasal drainage, MP 4 Cardiac - regular, no murmur Chest - no wheeze, rales Abd - soft, non tender Ext - no edema Skin - no rashes Neuro - normal strength Psych - normal mood  Assessment/Plan:  Allergic asthma and rhinitis with worsening symptoms. - continue beconase, zyrtec - add singulair - use symbicort in place of qvar for now - pro albuterol  Obstructive sleep apnea. - she reports compliance with CPAP and benefit from therapy - continue CPAP 13 cm H2O - she will bring in her SD card at next visit  Chronic respiratory failure 2nd to obesity hypoventilation syndrome. - continue 2 liters oxygen at night with CPAP - discussed importance of weight loss   Patient Instructions  Symbicort two puffs twice per day, and rinse mouth after each use Singulair 10 mg pill nightly Stop Qvar while using Symbicort  Follow up in 8 weeks with Dr. Craige CottaSood or Nurse Practitioner    Coralyn HellingVineet Emani Morad, MD [facility] 02/07/2018, 12:30 PM Pager:  [phone]  Flow Sheet  Sleep tests: PSG 06/08/04 >> RDI 92.1, oxygen nadir 57% ONO on RA 08/03/13 >> 203 min with SpO2 < 88% Split night 12/22/13 >> AHI 27, SaO2 low 77%. CPAP 12 cm H2O >>  AHI 5.9, +R, +S. Did not need supplemental oxygen. ONO with CPAP and 2 liters 02/13/14 >> Test time 8 hrs 36 min. Basal SpO2 98%, low SpO2 63% (artifact). Spent 0.4 min with SpO2 < 88%.  CPAP 11/10/16 to 12/09/16 >> used on 30 of 30 nights with average 8 hrs 16 min.  Average AHI 11 with CPAP 12 cm H2O  Pulmonary tests PFT 01/29/14 >> FEV1 1.79 (81%), FEV1% 86, TLC 3.47 (74%), DLCO 71% PFT 04/29/15 >> FEV1 1.53 (66%), FEV1% 80, TLC 3.74 (76%), DLCO 46%  Cardiac tests Echo 04/12/15 >> EF 65 to 70%, PAS 35 mmHg  Past Medical History: She  has a past medical history of Allergic rhinitis, Chronic otitis media, Coronary artery disease, Diastolic CHF, chronic (HCC), Dysphagia, History of cervical dysplasia, History of herpes zoster, HTN (hypertension), Human immunodeficiency virus (HIV) (HCC) (2001), Obesity, OSA (obstructive sleep apnea), Pulmonary HTN (HCC), and Wrist fracture, bilateral.  Past Surgical History: She  has a past surgical history that includes Tympanostomy tube placement; Cesarean section; Nasal sinus surgery (2004); Wrist fracture surgery (2006); Adenoidectomy; and right heart catheterization (N/A, 02/01/2012).  Family History: Her family history includes Alcohol abuse in her brother, father, mother, and sister; Hyperlipidemia in her mother; Hypertension in her brother, father, mother, sister, and unknown relative; Kidney disease in her father; Other in her father; Other (  age of onset: 81) in her mother.  Social History: She  reports that she has never smoked. She has never used smokeless tobacco. She reports that she does not drink alcohol or use drugs.  Medications: Allergies as of 02/07/2018      Reactions   Lisinopril Swelling   Very swollen bottom lip      Medication List        Accurate as of 02/07/18 12:30 PM. Always use your most recent med list.          amLODipine 10 MG tablet Commonly known as:  NORVASC Take 1 tablet (10 mg total) by mouth daily.     beclomethasone 42 MCG/SPRAY nasal spray Commonly known as:  BECONASE-AQ Place 1 spray into both nostrils 2 (two) times daily. Dose is for each nostril.   bictegravir-emtricitabine-tenofovir AF 50-200-25 MG Tabs tablet Commonly known as:  BIKTARVY Take 1 tablet by mouth daily.   budesonide-formoterol 160-4.5 MCG/ACT inhaler Commonly known as:  SYMBICORT Inhale 2 puffs into the lungs 2 (two) times daily.   cetirizine 10 MG tablet Commonly known as:  ZYRTEC TAKE 1 TABLET BY MOUTH DAILY   furosemide 20 MG tablet Commonly known as:  LASIX Take 1 tablet (20 mg total) by mouth daily. May take extra tablet once daily as needed for weight gain 3 lbs or more overnight.   ibuprofen 400 MG tablet Commonly known as:  ADVIL,MOTRIN Take 1 tablet (400 mg total) by mouth every 6 (six) hours as needed.   methocarbamol 500 MG tablet Commonly known as:  ROBAXIN Take 1 tablet (500 mg total) by mouth 2 (two) times daily.   montelukast 10 MG tablet Commonly known as:  SINGULAIR Take 1 tablet (10 mg total) by mouth at bedtime.   MULTI-VITAMINS Tabs TAKE 1 TABLET BY MOUTH DAILY   naphazoline-pheniramine 0.025-0.3 % ophthalmic solution Commonly known as:  NAPHCON-A INSTILL 1 DROP INTO BOTH EYES FOUR TIMES A DAY AS NEEDED FOR IRRITATION.   omeprazole 40 MG capsule Commonly known as:  PRILOSEC TAKE 1 CAPSULE BY MOUTH DAILY   PROAIR HFA 108 (90 Base) MCG/ACT inhaler Generic drug:  albuterol INHALE 1 OR 2 PUFFS INTO THE LUNGS EVERY 6 HOURS AS NEEDED WHEEZING OR SHORTNESS OF BREATH

## 2018-02-08 ENCOUNTER — Other Ambulatory Visit: Payer: Self-pay | Admitting: Family Medicine

## 2018-03-07 ENCOUNTER — Other Ambulatory Visit: Payer: Self-pay | Admitting: Pulmonary Disease

## 2018-03-07 ENCOUNTER — Other Ambulatory Visit: Payer: Self-pay | Admitting: Student in an Organized Health Care Education/Training Program

## 2018-03-24 ENCOUNTER — Telehealth: Payer: Self-pay | Admitting: Behavioral Health

## 2018-03-24 ENCOUNTER — Telehealth: Payer: Self-pay | Admitting: Pulmonary Disease

## 2018-03-24 DIAGNOSIS — B20 Human immunodeficiency virus [HIV] disease: Secondary | ICD-10-CM

## 2018-03-24 MED ORDER — ELVITEG-COBIC-EMTRICIT-TENOFAF 150-150-200-10 MG PO TABS: 1.0000 | ORAL_TABLET | Freq: Every day | ORAL | 11 refills | Status: DC

## 2018-03-24 NOTE — Telephone Encounter (Signed)
Patient called stating she has developed an itching rash on her neck ever since she started taking Biktarvy.  Patient states she has been taking Genvoya for years and has never had a problem.  Writer informed her per Dr. Feliz Beam note, the change was minimize risks with her steroid inhaler medications.  Patient states her Pulmonologist states this is untrue.  Writer informed Dr. Drue Second of situation verbally.  Verbal order given for patient to discontinue Biktarvy and to restart Genvoya.  Genvoya rx sent to [facility].  Also verbal order for patient to continue Zyrtec and to use over the counter hydrocortisone. Orders verbalized understanding with read-back.   Called Ms. Knippel back to informed her to stop Radio producer and to restart Genvoya.  Also to take zyrtec and use over the counter hydrocortisone cream for rash and itching and to call the office Monday to give Dr. Drue Second an update. Angeline Slim RN

## 2018-03-24 NOTE — Telephone Encounter (Signed)
ATC pt, no answer. Left message for pt to call back.  

## 2018-03-28 NOTE — Telephone Encounter (Signed)
ATC pt. She did not answer and VM was not setup. Will attempt to call back later.

## 2018-03-29 NOTE — Telephone Encounter (Signed)
Spoke with pt, she states she handled this with another office, she did not mean to call us about this. Nothing further is needed.

## 2018-04-05 ENCOUNTER — Other Ambulatory Visit: Payer: Self-pay | Admitting: Family Medicine

## 2018-04-12 ENCOUNTER — Encounter: Payer: Self-pay | Admitting: Pulmonary Disease

## 2018-04-12 ENCOUNTER — Ambulatory Visit (INDEPENDENT_AMBULATORY_CARE_PROVIDER_SITE_OTHER): Payer: Medicaid Other | Admitting: Pulmonary Disease

## 2018-04-12 VITALS — BP 130/90 | HR 76 | Ht 63.0 in | Wt 220.0 lb

## 2018-04-12 DIAGNOSIS — Z9989 Dependence on other enabling machines and devices: Secondary | ICD-10-CM

## 2018-04-12 DIAGNOSIS — E662 Morbid (severe) obesity with alveolar hypoventilation: Secondary | ICD-10-CM

## 2018-04-12 DIAGNOSIS — J9611 Chronic respiratory failure with hypoxia: Secondary | ICD-10-CM | POA: Diagnosis not present

## 2018-04-12 DIAGNOSIS — J45909 Unspecified asthma, uncomplicated: Secondary | ICD-10-CM

## 2018-04-12 DIAGNOSIS — G4733 Obstructive sleep apnea (adult) (pediatric): Secondary | ICD-10-CM | POA: Diagnosis not present

## 2018-04-12 DIAGNOSIS — Z6835 Body mass index (BMI) 35.0-35.9, adult: Secondary | ICD-10-CM | POA: Diagnosis not present

## 2018-04-12 NOTE — Progress Notes (Signed)
[name]  Chief Complaint  Patient presents with  . Follow-up    Pt doing well overall    Vital signs: BP 130/90 (BP Location: Left Arm, Cuff Size: Normal)   Pulse 76   Ht  (1.6 m)   Wt 220 lb (99.8 kg)   SpO2 91%   BMI 38.97 kg/m   History of Present Illness: Leslie Gates is a 52 y.o. female with OSA, OHS, asthma.  Breathing and sinuses improved since adding singulair and changing to symbicort.  Still has trouble with hot weather.  Not needing albuterol much.  Uses CPAP and oxygen nightly.  Mask fits well.  Feels rested.  She is planning to start diet and exercise regimen.  Denies cough, wheeze, sputum, sinus congestion, sore throat, fever, hemoptysis, chest pain.  Gets ankle swelling intermittently.  Had rash on her neck from medication reaction, that is getting better.  Physical Exam:  General - pleasant Eyes - pupils reactive ENT - no sinus tenderness, no oral exudate, no LAN, MP 4 Cardiac - regular, no murmur Chest - no wheeze, rales Abd - soft, non tender Ext - mild ankle edema Skin - no rashes Neuro - normal strength Psych - normal mood  Assessment/Plan:  Allergic asthma and rhinitis. - continue symbicort, sinulair - prn zyrtec, albuterol - will reassess symptoms status at next visit, and if stable to improved then consider stepping down her regimen  Obstructive sleep apnea. - she is compliant with CPAP and reports benefit from therapy - continue CPAP 13 cm H2O  Chronic respiratory failure 2nd to obesity hypoventilation syndrome. - continue 2 liters oxygen at night with CPAP  Obesity. - discussed options to assist with weight loss   Patient Instructions  Follow up in 6 months    Coralyn Helling, MD [facility] Pulmonary/Critical Care 04/12/2018, 10:45 AM Pager:  [phone]  Flow Sheet  Sleep tests: PSG 06/08/04 >> RDI 92.1, oxygen nadir 57% ONO on RA 08/03/13 >> 203 min with SpO2 < 88% Split  night 12/22/13 >> AHI 27, SaO2 low 77%. CPAP 12 cm H2O >> AHI 5.9, +R, +S. Did not need supplemental oxygen. ONO with CPAP and 2 liters 02/13/14 >> Test time 8 hrs 36 min. Basal SpO2 98%, low SpO2 63% (artifact). Spent 0.4 min with SpO2 < 88%.  CPAP 01/13/18 to 04/12/18 >> used on 90 of 90 nights with average 8 hrs 43 min.  Average AHI 3.2 with CPAP 13 cm H2O.  Pulmonary tests PFT 01/29/14 >> FEV1 1.79 (81%), FEV1% 86, TLC 3.47 (74%), DLCO 71% PFT 04/29/15 >> FEV1 1.53 (66%), FEV1% 80, TLC 3.74 (76%), DLCO 46%  Cardiac tests Echo 04/12/15 >> EF 65 to 70%, PAS 35 mmHg  Past Medical History: She  has a past medical history of Allergic rhinitis, Chronic otitis media, Coronary artery disease, Diastolic CHF, chronic (HCC), Dysphagia, History of cervical dysplasia, History of herpes zoster, HTN (hypertension), Human immunodeficiency virus (HIV) (HCC) (2001), Obesity, OSA (obstructive sleep apnea), Pulmonary HTN (HCC), and Wrist fracture, bilateral.  Past Surgical History: She  has a past surgical history that includes Tympanostomy tube placement; Cesarean section; Nasal sinus surgery (2004); Wrist fracture surgery (2006); Adenoidectomy; and right heart catheterization (N/A, 02/01/2012).  Family History: Her family history includes Alcohol abuse in her brother, father, mother, and sister; Hyperlipidemia in her mother; Hypertension in her brother, father, mother, sister, and unknown relative; Kidney disease in her father; Other in her father; Other (age of onset: 26)  in her mother.  Social History: She  reports that she has never smoked. She has never used smokeless tobacco. She reports that she does not drink alcohol or use drugs.  Medications: Allergies as of 04/12/2018      Reactions   Lisinopril Swelling   Very swollen bottom lip      Medication List        Accurate as of 04/12/18 10:45 AM. Always use your most recent med list.          amLODipine 10 MG tablet Commonly known as:   NORVASC Take 1 tablet (10 mg total) by mouth daily.   beclomethasone 42 MCG/SPRAY nasal spray Commonly known as:  BECONASE-AQ Place 1 spray into both nostrils 2 (two) times daily. Dose is for each nostril.   budesonide-formoterol 160-4.5 MCG/ACT inhaler Commonly known as:  SYMBICORT Inhale 2 puffs into the lungs 2 (two) times daily.   cetirizine 10 MG tablet Commonly known as:  ZYRTEC TAKE 1 TABLET BY MOUTH DAILY   elvitegravir-cobicistat-emtricitabine-tenofovir 150-150-200-10 MG Tabs tablet Commonly known as:  GENVOYA Take 1 tablet by mouth daily with breakfast.   furosemide 20 MG tablet Commonly known as:  LASIX Take 1 tablet (20 mg total) by mouth daily. May take extra tablet once daily as needed for weight gain 3 lbs or more overnight.   ibuprofen 400 MG tablet Commonly known as:  ADVIL,MOTRIN Take 1 tablet (400 mg total) by mouth every 6 (six) hours as needed.   methocarbamol 500 MG tablet Commonly known as:  ROBAXIN Take 1 tablet (500 mg total) by mouth 2 (two) times daily.   montelukast 10 MG tablet Commonly known as:  SINGULAIR Take 1 tablet (10 mg total) by mouth at bedtime.   MULTI-VITAMINS Tabs TAKE 1 TABLET BY MOUTH DAILY   naphazoline-pheniramine 0.025-0.3 % ophthalmic solution Commonly known as:  NAPHCON-A INSTILL 1 DROP INTO BOTH EYES FOUR TIMES A DAY AS NEEDED FOR IRRITATION.   omeprazole 40 MG capsule Commonly known as:  PRILOSEC TAKE 1 CAPSULE BY MOUTH DAILY   PROAIR HFA 108 (90 Base) MCG/ACT inhaler Generic drug:  albuterol INHALE 1 OR 2 PUFFS INTO THE LUNGS EVERY 6 HOURS AS NEEDED FOR WHEEZING OR SHORTNESS OF BREATH

## 2018-04-12 NOTE — Patient Instructions (Signed)
Follow up in 6 months 

## 2018-04-14 ENCOUNTER — Other Ambulatory Visit: Payer: Medicaid Other

## 2018-04-20 ENCOUNTER — Ambulatory Visit: Payer: Medicaid Other | Admitting: Internal Medicine

## 2018-04-28 ENCOUNTER — Encounter: Payer: Self-pay | Admitting: Internal Medicine

## 2018-04-28 ENCOUNTER — Encounter: Payer: Self-pay | Admitting: Gastroenterology

## 2018-04-28 ENCOUNTER — Ambulatory Visit (INDEPENDENT_AMBULATORY_CARE_PROVIDER_SITE_OTHER): Payer: Medicaid Other | Admitting: Internal Medicine

## 2018-04-28 VITALS — BP 161/102 | HR 69 | Temp 98.6°F | Wt 219.0 lb

## 2018-04-28 DIAGNOSIS — B20 Human immunodeficiency virus [HIV] disease: Secondary | ICD-10-CM

## 2018-04-28 DIAGNOSIS — Z23 Encounter for immunization: Secondary | ICD-10-CM | POA: Diagnosis not present

## 2018-04-28 DIAGNOSIS — I10 Essential (primary) hypertension: Secondary | ICD-10-CM | POA: Diagnosis not present

## 2018-04-28 DIAGNOSIS — Z0000 Encounter for general adult medical examination without abnormal findings: Secondary | ICD-10-CM

## 2018-04-28 LAB — CBC WITH DIFFERENTIAL/PLATELET
Basophils Absolute: 49 cells/uL (ref 0–200)
Basophils Relative: 1.3 %
EOS PCT: 2.6 %
Eosinophils Absolute: 99 cells/uL (ref 15–500)
HEMATOCRIT: 39.6 % (ref 35.0–45.0)
HEMOGLOBIN: 13.4 g/dL (ref 11.7–15.5)
LYMPHS ABS: 2090 {cells}/uL (ref 850–3900)
MCH: 28.8 pg (ref 27.0–33.0)
MCHC: 33.8 g/dL (ref 32.0–36.0)
MCV: 85 fL (ref 80.0–100.0)
MPV: 10.1 fL (ref 7.5–12.5)
Monocytes Relative: 9.7 %
NEUTROS ABS: 1193 {cells}/uL — AB (ref 1500–7800)
Neutrophils Relative %: 31.4 %
Platelets: 331 10*3/uL (ref 140–400)
RBC: 4.66 10*6/uL (ref 3.80–5.10)
RDW: 13.3 % (ref 11.0–15.0)
Total Lymphocyte: 55 %
WBC mixed population: 369 cells/uL (ref 200–950)
WBC: 3.8 10*3/uL (ref 3.8–10.8)

## 2018-04-28 LAB — COMPLETE METABOLIC PANEL WITH GFR
AG Ratio: 1.3 (calc) (ref 1.0–2.5)
ALBUMIN MSPROF: 4.3 g/dL (ref 3.6–5.1)
ALKALINE PHOSPHATASE (APISO): 65 U/L (ref 33–130)
ALT: 16 U/L (ref 6–29)
AST: 17 U/L (ref 10–35)
BUN: 14 mg/dL (ref 7–25)
CO2: 33 mmol/L — AB (ref 20–32)
CREATININE: 0.78 mg/dL (ref 0.50–1.05)
Calcium: 9.8 mg/dL (ref 8.6–10.4)
Chloride: 101 mmol/L (ref 98–110)
GFR, EST NON AFRICAN AMERICAN: 89 mL/min/{1.73_m2} (ref >=60)
GFR, Est African American: 103 mL/min/{1.73_m2} (ref >=60)
GLOBULIN: 3.4 g/dL (ref 1.9–3.7)
GLUCOSE: 106 mg/dL — AB (ref 65–99)
Potassium: 4.4 mmol/L (ref 3.5–5.3)
SODIUM: 139 mmol/L (ref 135–146)
Total Bilirubin: 0.3 mg/dL (ref 0.2–1.2)
Total Protein: 7.7 g/dL (ref 6.1–8.1)

## 2018-04-28 NOTE — Progress Notes (Signed)
RFV: follow up for hiv disease   Patient ID: Leslie Gates, female   DOB: 01-Oct-1966, 52 y.o.   MRN: [id number]  HPI Leslie Gates is 52yo F with hiv disease, Cd4 count of 760/VL 25 in feb 2019. We had attempted to change her to biktarvy but she started to develop a rash to her neck shortly after starting it. She was changed back to her original regimen of genvoya. She is tolerating without difficulty.Seen by dr Craige Cotta for osa. She is working on weight loss, diet modification.  Outpatient Encounter Medications as of 04/28/2018  Medication Sig  . amLODipine (NORVASC) 10 MG tablet Take 1 tablet (10 mg total) by mouth daily.  . beclomethasone (BECONASE-AQ) 42 MCG/SPRAY nasal spray Place 1 spray into both nostrils 2 (two) times daily. Dose is for each nostril.  . budesonide-formoterol (SYMBICORT) 160-4.5 MCG/ACT inhaler Inhale 2 puffs into the lungs 2 (two) times daily.  . cetirizine (ZYRTEC) 10 MG tablet TAKE 1 TABLET BY MOUTH DAILY  . elvitegravir-cobicistat-emtricitabine-tenofovir (GENVOYA) 150-150-200-10 MG TABS tablet Take 1 tablet by mouth daily with breakfast.  . furosemide (LASIX) 20 MG tablet Take 1 tablet (20 mg total) by mouth daily. May take extra tablet once daily as needed for weight gain 3 lbs or more overnight.  Marland Kitchen ibuprofen (ADVIL,MOTRIN) 400 MG tablet Take 1 tablet (400 mg total) by mouth every 6 (six) hours as needed.  . methocarbamol (ROBAXIN) 500 MG tablet Take 1 tablet (500 mg total) by mouth 2 (two) times daily.  . montelukast (SINGULAIR) 10 MG tablet Take 1 tablet (10 mg total) by mouth at bedtime.  . Multiple Vitamin (MULTI-VITAMINS) TABS TAKE 1 TABLET BY MOUTH DAILY  . naphazoline-pheniramine (NAPHCON-A) 0.025-0.3 % ophthalmic solution INSTILL 1 DROP INTO BOTH EYES FOUR TIMES A DAY AS NEEDED FOR IRRITATION.  Marland Kitchen omeprazole (PRILOSEC) 40 MG capsule TAKE 1 CAPSULE BY MOUTH DAILY  . PROAIR HFA 108 (90 Base) MCG/ACT inhaler INHALE 1 OR 2 PUFFS INTO THE LUNGS EVERY 6 HOURS AS NEEDED  FOR WHEEZING OR SHORTNESS OF BREATH   No facility-administered encounter medications on file as of 04/28/2018.      Patient Active Problem List   Diagnosis Date Noted  . Left wrist pain 08/24/2017  . Chronic diastolic heart failure (HCC) 06/25/2017  . Prediabetes 04/16/2016  . Moderate dysplasia of cervix 07/10/2013  . History of angioedema 09/09/2011  . Obesity hypoventilation syndrome (HCC) 07/10/2010  . Morbid obesity (HCC) 07/01/2010  . Obstructive sleep apnea 06/27/2010  . Pulmonary hypertension (HCC) 05/28/2010  . Allergic rhinitis 03/14/2008  . Chronic otitis media 03/22/2007  . Primary hypertension 03/22/2007  . HIV disease (HCC) 06/21/2002     Health Maintenance Due  Topic Date Due  . FOOT EXAM  07/05/1977  . OPHTHALMOLOGY EXAM  07/05/1977  . URINE MICROALBUMIN  07/05/1977  . TETANUS/TDAP  07/05/1986  . COLONOSCOPY  07/05/2017  . HEMOGLOBIN A1C  08/01/2017     Review of Systems Per hpi, 12 point ros is otherwise negative Physical Exam   BP (!) 161/102   Pulse 69   Temp 98.6 F (37 C) (Oral)   Wt 219 lb (99.3 kg)   BMI 38.79 kg/m   Physical Exam  Constitutional:  oriented to person, place, and time. appears well-developed and well-nourished. No distress.  HENT: [place]/AT, PERRLA, no scleral icterus Mouth/Throat: Oropharynx is clear and moist. No oropharyngeal exudate.  Cardiovascular: Normal rate, regular rhythm and normal heart sounds. Exam reveals no gallop and no friction rub.  No  murmur heard.  Pulmonary/Chest: Effort normal and breath sounds normal. No respiratory distress.  has no wheezes.  Neck = supple, no nuchal rigidity Neurological: alert and oriented to person, place, and time.  Skin: Skin is warm and dry. No rash noted. No erythema.  Psychiatric: a normal mood and affect.  behavior is normal.   Lab Results  Component Value Date   CD4TCELL 31 (L) 12/20/2017   Lab Results  Component Value Date   CD4TABS 760 12/20/2017   CD4TABS 670  03/30/2017   CD4TABS 570 07/14/2016   Lab Results  Component Value Date   HIV1RNAQUANT 25 (H) 12/20/2017   Lab Results  Component Value Date   HEPBSAB NEG 02/08/2009   Lab Results  Component Value Date   LABRPR NON REAC 03/30/2017    CBC Lab Results  Component Value Date   WBC 4.9 12/20/2017   RBC 4.40 12/20/2017   HGB 13.1 12/20/2017   HCT 38.8 12/20/2017   PLT 305 12/20/2017   MCV 88.2 12/20/2017   MCH 29.8 12/20/2017   MCHC 33.8 12/20/2017   RDW 12.9 12/20/2017   LYMPHSABS 2,176 12/20/2017   MONOABS 517 03/30/2017   EOSABS 181 12/20/2017    BMET Lab Results  Component Value Date   NA 141 12/20/2017   K 4.3 12/20/2017   CL 100 12/20/2017   CO2 34 (H) 12/20/2017   GLUCOSE 119 (H) 12/20/2017   BUN 12 12/20/2017   CREATININE 0.77 12/20/2017   CALCIUM 9.9 12/20/2017   GFRNONAA >60 06/25/2017   GFRAA >60 06/25/2017      Assessment and Plan  htn = not at goal. Has not taken meds this am  hiv = will get labs. Anticipate she is still undetectable. Plan to continue on current regimen  Health maintenance = will do referral for colonoscopy. Will give prevnar 13

## 2018-04-29 ENCOUNTER — Ambulatory Visit (INDEPENDENT_AMBULATORY_CARE_PROVIDER_SITE_OTHER): Payer: Medicaid Other | Admitting: Family Medicine

## 2018-04-29 ENCOUNTER — Other Ambulatory Visit: Payer: Self-pay

## 2018-04-29 ENCOUNTER — Encounter: Payer: Self-pay | Admitting: Family Medicine

## 2018-04-29 VITALS — BP 128/88 | HR 71 | Temp 98.7°F | Ht 63.0 in | Wt 219.0 lb

## 2018-04-29 DIAGNOSIS — I10 Essential (primary) hypertension: Secondary | ICD-10-CM | POA: Diagnosis not present

## 2018-04-29 DIAGNOSIS — R7303 Prediabetes: Secondary | ICD-10-CM | POA: Diagnosis present

## 2018-04-29 DIAGNOSIS — I5032 Chronic diastolic (congestive) heart failure: Secondary | ICD-10-CM | POA: Diagnosis not present

## 2018-04-29 DIAGNOSIS — E782 Mixed hyperlipidemia: Secondary | ICD-10-CM | POA: Diagnosis not present

## 2018-04-29 DIAGNOSIS — E1169 Type 2 diabetes mellitus with other specified complication: Secondary | ICD-10-CM | POA: Insufficient documentation

## 2018-04-29 DIAGNOSIS — E785 Hyperlipidemia, unspecified: Secondary | ICD-10-CM | POA: Insufficient documentation

## 2018-04-29 LAB — T-HELPER CELL (CD4) - (RCID CLINIC ONLY)
CD4 T CELL ABS: 580 /uL (ref 400–2700)
CD4 T CELL HELPER: 28 % — AB (ref 33–55)

## 2018-04-29 LAB — POCT GLYCOSYLATED HEMOGLOBIN (HGB A1C): HbA1c, POC (prediabetic range): 6.4 % (ref 5.7–6.4)

## 2018-04-29 MED ORDER — ATORVASTATIN CALCIUM 40 MG PO TABS: 40.0000 mg | ORAL_TABLET | Freq: Every day | ORAL | 3 refills | Status: DC

## 2018-04-29 MED ORDER — FUROSEMIDE 20 MG PO TABS: 20.0000 mg | ORAL_TABLET | Freq: Every day | ORAL | 6 refills | Status: DC

## 2018-04-29 NOTE — Assessment & Plan Note (Signed)
Chronic.  Has mixed hyperlipidemia based on history.  Fasting today.  On statin.  Has known prediabetes. - Initiating Lipitor 40 mg daily - Checking LDL level

## 2018-04-29 NOTE — Patient Instructions (Addendum)
Thank you for coming in to see us today. Please see below to review our plan for today's visit.  1.  I am pleased to hear you are interested in weight loss! I believe you can do this!  Your goal is to exercise at least 30 minutes a day with moderate intensity (jogging or swimming) Monday through Friday.  You can also do Zumba.  More important, we need to restrict her diet and avoid snacking at all costs and eating after dinner.  Do not eat second helpings.  We will work on trying to achieve a 10 pound weight loss over the next 3 months.  We will arrange for you to follow-up with Dr. Gerilyn PilgrimSykes given your diagnosis of prediabetes. 2.  I sent in a refill for your Lasix.  Take as instructed.  We will arrange for you to get your echocardiogram.  Call Dr. Prescott GumBensimhon's office and schedule a follow-up appointment. 3.  We will start you on a new medication called atorvastatin.  If you develop any muscle pain, discontinue this medication and let me know immediately.  Please call the clinic at [phone] if your symptoms worsen or you have any concerns. It was our pleasure to serve you.  Durward Parcelavid Keefer Soulliere, DO [facility], PGY-2

## 2018-04-29 NOTE — Progress Notes (Signed)
Subjective   Patient ID: Leslie Gates    DOB: 1966/08/29, 52 y.o. female   MRN: [id number]  CC: "Physical exam"  HPI: Leslie Gates is a 52 y.o. female who presents to clinic today for the following:  Obesity: Patient interested in weight loss today.  She is currently 220 pounds and will like to make it a goal to drop 250 pounds.  She tried Zumba for the first time today but was instructed to discontinue due to shortness of breath with exercise.  She is here and would like a note to be excused so that she can participate in Oral.  She has not made any dietary efforts.  Heart failure: Patient last seen by Dr. Gala Romney for heart failure on 06/2017.  She failed to follow-up with echocardiogram.  She has EF of 55-60% based on her echocardiogram 3 years ago.  She was previously on Lasix 20 mg daily but self discontinued herself on this medication.  She denies recent history of chest pain, palpitations, orthopnea, PND.  She does have some associated shortness of breath but does have underlying pulmonary hypertension, OSA, OHS.  Hypertension: Patient with long-standing hypertension and adherent to amlodipine.  She has never smoker.  Hyperlipidemia: Patient has known hyperlipidemia based on fasting lipid panel several years ago.  She does not take statin therapy.  She is not on daily aspirin.  She does have known prediabetes and diastolic heart failure.  ROS: see HPI for pertinent.  PMFSH: Controlled HIV, pulmonary HTN, HTN, prediabetes, OSA, OHS, morbid obesity, HFpEF.  Surgical history bilateral wrist fracture, RHC (2013), nasal sinus, C-section, adenoidectomy.  History HTN, HLD, CKD, alcohol use disorder.  Smoking status reviewed. Medications reviewed.  Objective   BP 128/88   Pulse 71   Temp 98.7 F (37.1 C) (Oral)   Ht 5\' 3"  (1.6 m)   Wt 219 lb (99.3 kg)   LMP 03/18/2018   SpO2 99%   BMI 38.79 kg/m  Vitals and nursing note reviewed.  General: well nourished, well developed,  NAD with non-toxic appearance HEENT: normocephalic, atraumatic, moist mucous membranes Neck: supple, non-tender without lymphadenopathy Cardiovascular: regular rate and rhythm without murmurs, rubs, or gallops Lungs: clear to auscultation bilaterally with normal work of breathing Abdomen: obese, soft, non-tender, non-distended, normoactive bowel sounds Skin: warm, dry, no rashes or lesions, cap refill < 2 seconds Extremities: warm and well perfused, normal tone, no edema  Assessment & Plan   Morbid obesity (HCC) Chronic.  Patient with multiple comorbidities including prediabetes and heart failure.  Appears to be a good candidate and motivated to lose weight. - Congratulated patient with a goal to lose 10 pounds over the next 3 months - Encouraged her to exercise 30 minutes daily on weekdays, given note to be excused to perform Zumba and other exercises at her discretion - Discussed at length importance of caloric restriction - Ambulatory referral to diabetic nutrition  Prediabetes Chronic.  Remains prediabetic A1c 6.4. - See plan for obesity, lifestyle modifications at this time  Primary hypertension Chronic.  Controlled, BP 128/88.  On amlodipine but not adherent to Lasix for heart failure.  Never smoker. - Continue amlodipine 10 mg daily, given refill for Lasix 20 mg daily  Chronic diastolic heart failure (HCC) Chronic.  Patient has not followed up with cardiology in almost 1 year.  Failed to get a repeat echocardiogram and is not adherent to Lasix.  Patient without signs of acute heart failure exacerbation. - Given refill for Lasix 20  mg daily with instructions to continue call cardiologist for follow-up appointment - Complete transthoracic echocardiogram ordered - Reviewed return precautions - RTC 1 month  Hyperlipidemia Chronic.  Has mixed hyperlipidemia based on history.  Fasting today.  On statin.  Has known prediabetes. - Initiating Lipitor 40 mg daily - Checking LDL  level  Orders Placed This Encounter  Procedures  . LDL Cholesterol, Direct  . Ambulatory referral to diabetic education    Referral Priority:   Routine    Referral Type:   Consultation    Referral Reason:   Specialty Services Required    Number of Visits Requested:   1  . POCT glycosylated hemoglobin (Hb A1C)  . ECHOCARDIOGRAM COMPLETE    Has known HFpEF.    Standing Status:   Future    Standing Expiration Date:   07/31/2019    Order Specific Question:   Where should this test be performed    Answer:   [place]    Order Specific Question:   Perflutren DEFINITY (image enhancing agent) should be administered unless hypersensitivity or allergy exist    Answer:   Administer Perflutren    Order Specific Question:   Expected Date:    Answer:   1 week   Meds ordered this encounter  Medications  . atorvastatin (LIPITOR) 40 MG tablet    Sig: Take 1 tablet (40 mg total) by mouth daily.    Dispense:  90 tablet    Refill:  3  . furosemide (LASIX) 20 MG tablet    Sig: Take 1 tablet (20 mg total) by mouth daily. May take extra tablet once daily as needed for weight gain 3 lbs or more overnight.    Dispense:  60 tablet    Refill:  6    Durward Parcelavid McMullen, DO [facility], PGY-2 04/29/2018, 5:21 PM

## 2018-04-29 NOTE — Assessment & Plan Note (Signed)
Chronic.  Remains prediabetic A1c 6.4. - See plan for obesity, lifestyle modifications at this time

## 2018-04-29 NOTE — Assessment & Plan Note (Signed)
Chronic.  Controlled, BP 128/88.  On amlodipine but not adherent to Lasix for heart failure.  Never smoker. - Continue amlodipine 10 mg daily, given refill for Lasix 20 mg daily

## 2018-04-29 NOTE — Assessment & Plan Note (Signed)
Chronic.  Patient with multiple comorbidities including prediabetes and heart failure.  Appears to be a good candidate and motivated to lose weight. - Congratulated patient with a goal to lose 10 pounds over the next 3 months - Encouraged her to exercise 30 minutes daily on weekdays, given note to be excused to perform Zumba and other exercises at her discretion - Discussed at length importance of caloric restriction - Ambulatory referral to diabetic nutrition

## 2018-04-29 NOTE — Assessment & Plan Note (Signed)
Chronic.  Patient has not followed up with cardiology in almost 1 year.  Failed to get a repeat echocardiogram and is not adherent to Lasix.  Patient without signs of acute heart failure exacerbation. - Given refill for Lasix 20 mg daily with instructions to continue call cardiologist for follow-up appointment - Complete transthoracic echocardiogram ordered - Reviewed return precautions - RTC 1 month

## 2018-04-30 LAB — LDL CHOLESTEROL, DIRECT: LDL Direct: 122 mg/dL — ABNORMAL HIGH (ref 0–99)

## 2018-05-02 NOTE — Addendum Note (Signed)
Addended by: Alesia MorinPOOLE, TRAVIS F on: 05/02/2018 10:50 AM   Modules accepted: Orders

## 2018-05-03 LAB — HIV-1 RNA QUANT-NO REFLEX-BLD
HIV 1 RNA QUANT: NOT DETECTED {copies}/mL
HIV-1 RNA QUANT, LOG: NOT DETECTED {Log_copies}/mL

## 2018-05-06 ENCOUNTER — Other Ambulatory Visit: Payer: Self-pay | Admitting: Family Medicine

## 2018-05-10 ENCOUNTER — Ambulatory Visit (HOSPITAL_COMMUNITY)
Admission: RE | Admit: 2018-05-10 | Discharge: 2018-05-10 | Disposition: A | Discharge status: Home / Self Care | Payer: Medicaid Other | Source: Ambulatory Visit | Attending: Family Medicine | Admitting: Family Medicine

## 2018-05-10 DIAGNOSIS — I071 Rheumatic tricuspid insufficiency: Secondary | ICD-10-CM | POA: Insufficient documentation

## 2018-05-10 DIAGNOSIS — E119 Type 2 diabetes mellitus without complications: Secondary | ICD-10-CM | POA: Insufficient documentation

## 2018-05-10 DIAGNOSIS — I5032 Chronic diastolic (congestive) heart failure: Secondary | ICD-10-CM | POA: Insufficient documentation

## 2018-05-10 DIAGNOSIS — E785 Hyperlipidemia, unspecified: Secondary | ICD-10-CM | POA: Diagnosis not present

## 2018-05-10 NOTE — Progress Notes (Signed)
  Echocardiogram 2D Echocardiogram has been performed.  Yuki Brunsman T Calub Tarnow 05/10/2018, 2:55 PM

## 2018-05-16 DIAGNOSIS — R0602 Shortness of breath: Secondary | ICD-10-CM | POA: Diagnosis not present

## 2018-05-16 DIAGNOSIS — B20 Human immunodeficiency virus [HIV] disease: Secondary | ICD-10-CM | POA: Diagnosis not present

## 2018-05-16 DIAGNOSIS — G4733 Obstructive sleep apnea (adult) (pediatric): Secondary | ICD-10-CM | POA: Diagnosis not present

## 2018-05-16 DIAGNOSIS — R0902 Hypoxemia: Secondary | ICD-10-CM | POA: Diagnosis not present

## 2018-05-17 ENCOUNTER — Encounter: Payer: Medicaid Other | Attending: Family Medicine | Admitting: Registered"

## 2018-05-17 ENCOUNTER — Encounter: Payer: Self-pay | Admitting: Registered"

## 2018-05-17 DIAGNOSIS — Z713 Dietary counseling and surveillance: Secondary | ICD-10-CM | POA: Diagnosis not present

## 2018-05-17 DIAGNOSIS — R7303 Prediabetes: Secondary | ICD-10-CM | POA: Diagnosis not present

## 2018-05-17 NOTE — Patient Instructions (Addendum)
Get some good sleep.  Set your alarm for 10 pm and go to bed, put your CPAP on and go to sleep. If you wake up at 3 am try getting up reading to make yourself sleepy again and then go back back to bed. Try to get on a regular eating schedule, Breakfast, lunch and dinner and snacks if you are hungry in between. Beans are very good for you, keep eating them [employer] [phone] brings fresh food to several locations. [facility] [place] 4th Tuesday starting at 2:30 pm

## 2018-05-17 NOTE — Progress Notes (Signed)
Medical Nutrition Therapy:  Appt start time: 1545 end time:  1635.  Assessment:  Primary concerns today: Per referral pt is here for pre-diabetes A1c 6.4% 04/29/2018. Patient states she is on disability and 2 weeks ago has started going to school. Patient seems overwhelmed by ADLs naming off things like buying groceries and doing laundry as hard to keep up with. Pt states she wakes up at ~3 am worrying about all the stuff she has to do and will get up do household chores and watch TV and gets tired again around 9 am, today did not go back to sleep because she had class. Pt states in the evening she falls asleep on the couch watching TV and her 51 yr old son will wake her up suggesting she go to bed.   Pt arrived 45 min early for her appointment because if she went back home she would just go to sleep. Pt states she is using public transportation today because she knew she was too tired to drive.   Pt states she needs to use oxygen and should sleep with a CPAP, but doesn't always do so. Pt states she is often very tired. 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 Pt states she doesn't want to quit, but also doesn't think she has enough energy to make to all the classes. Patient states she found this class through [employer] [facility].  Pt states she doesn't eat breakfast and will notice in the afternoon will start feeling irritable and getting a headache and realizes that she has not eaten and then eat until she feels uncomfortably full. Pt states she does not plan meals, but just eats whatever sounds good, mostly snack foods like chips, candy, granola bars. Pt states at night when she is watching TV she eats a lot of fruit, loves peaches and grapes. Patient states her son requires a lot of food and she makes due. Pt denies SNAP benefits. Patient was interested in the Out  of the [employer] resource.   Preferred Learning Style:   No preference indicated   Learning Readiness:   Contemplating  MEDICATIONS: reviewed   DIETARY INTAKE:  24-hr recall:  B ( AM):    Snk ( AM):  L (12 PM): sandwiches  Snk ( PM): chips, candy, granola bars D (6 PM): rice, beans, baked chicken,  Snk ( PM): fruit Beverages: water  Usual physical activity: ADLs (RD observed moves slowly, gets out of breath)  Estimated energy needs: 1800 calories  Progress Towards Goal(s):  New goals.   Nutritional Diagnosis:  NB-1.1 Food and nutrition-related knowledge deficit As related to purpose of balanced eating and eating regular meals.  As evidenced by patient reported lack of knowledge of knowing what to eat..    Intervention:  Nutrition Education. Discussed balanced eating. Discussed the importance of good sleep to help support a routine during the day. Discussed scheduled eating times.   Plan: Get some good sleep.  Set your alarm for 10 pm and go to bed, put your CPAP on and go to sleep. If you wake up at 3 am try getting up reading to make yourself sleepy again and then go back back to bed. Try to get on a regular eating schedule, Breakfast, lunch and dinner and snacks if you are hungry in between. Beans are very good  for you, keep eating them [facility] [phone] brings fresh food to several locations. [facility] near [place] 4th Tuesday starting at 2:30 pm  Teaching Method Utilized:  Visual Auditory  Handouts given during visit include:  MyPlate (T2DM)  Healthy Eating at the [employer] (sample meals)  Barriers to learning/adherence to lifestyle change: low literacy, potential food insecurity, limited energy  Demonstrated degree of understanding via:  Teach Back   Monitoring/Evaluation:  Dietary intake, exercise, energy, and body weight in 4 week(s).

## 2018-06-16 DIAGNOSIS — B20 Human immunodeficiency virus [HIV] disease: Secondary | ICD-10-CM | POA: Diagnosis not present

## 2018-06-16 DIAGNOSIS — R0602 Shortness of breath: Secondary | ICD-10-CM | POA: Diagnosis not present

## 2018-06-16 DIAGNOSIS — R0902 Hypoxemia: Secondary | ICD-10-CM | POA: Diagnosis not present

## 2018-06-16 DIAGNOSIS — G4733 Obstructive sleep apnea (adult) (pediatric): Secondary | ICD-10-CM | POA: Diagnosis not present

## 2018-06-20 ENCOUNTER — Ambulatory Visit: Payer: Medicaid Other | Admitting: Registered"

## 2018-06-20 ENCOUNTER — Telehealth: Payer: Self-pay

## 2018-06-20 NOTE — Telephone Encounter (Signed)
[facility] called to make PCP aware that Atorvastatin has severe interaction with Genvoya prescribed by Dr. Judyann Munsonynthia Snider.  Need to know if ok to fill.  Call back is [phone]  Ples SpecterAlisa Brake, RN [facility] RN)

## 2018-06-22 ENCOUNTER — Other Ambulatory Visit: Payer: Self-pay | Admitting: Family Medicine

## 2018-06-22 DIAGNOSIS — E782 Mixed hyperlipidemia: Secondary | ICD-10-CM

## 2018-06-22 MED ORDER — ATORVASTATIN CALCIUM 20 MG PO TABS: 20.0000 mg | ORAL_TABLET | Freq: Every day | ORAL | 3 refills | Status: DC

## 2018-06-22 NOTE — Telephone Encounter (Signed)
Pharmacy called again to see if PCP aware of this and if ok to fill Atorvastatin. Interaction states can increase levels of Atorvastatin and recommend starting at lowest dose. (New customer for them, do not know how long she has been on Atorvastatin).  Please call the pharmacy and let them know if ok to fill:  [phone]  Ples SpecterAlisa Brake, RN [facility] RN)

## 2018-06-22 NOTE — Telephone Encounter (Signed)
Prescription sent for Atorvastatin 20 mg daily. This is half the dose she was on.  Durward Parcelavid Bernice Mullin, DO [facility], PGY-3

## 2018-06-23 NOTE — Telephone Encounter (Signed)
Contacted patient to inform her of her Lipitor being called into the pharmacy. She went into a tirade concerning how she had been changed from medicine to medicine by Dr. Drue SecondSnider and she was not happy. [name] also states that she will continue on Lasix but not anything else because she is too big already and does not want to gain weight.  Leslie Gates.Simpson, Michelle R, CMA

## 2018-06-23 NOTE — Telephone Encounter (Signed)
Noted.  David McMullen, DO [facility], PGY-3  

## 2018-06-28 ENCOUNTER — Ambulatory Visit (AMBULATORY_SURGERY_CENTER): Payer: Self-pay | Admitting: *Deleted

## 2018-06-28 VITALS — Ht 63.0 in | Wt 215.8 lb

## 2018-06-28 DIAGNOSIS — Z1211 Encounter for screening for malignant neoplasm of colon: Secondary | ICD-10-CM

## 2018-06-28 NOTE — Progress Notes (Signed)
Denies allergies to eggs or soy products. Denies complication of anesthesia or sedation. Denies use of weight loss medication.    Emmi instructions declined.   Patient is on home 0 2 so her procedure and pre-visit are cancelled. Patient is scheduled for an OV with Jessica on 07/22/18 at 1:45. Patient will be rescheduled at the hospital.

## 2018-07-12 ENCOUNTER — Encounter: Payer: Medicaid Other | Admitting: Gastroenterology

## 2018-07-17 DIAGNOSIS — R0902 Hypoxemia: Secondary | ICD-10-CM | POA: Diagnosis not present

## 2018-07-17 DIAGNOSIS — R0602 Shortness of breath: Secondary | ICD-10-CM | POA: Diagnosis not present

## 2018-07-17 DIAGNOSIS — B20 Human immunodeficiency virus [HIV] disease: Secondary | ICD-10-CM | POA: Diagnosis not present

## 2018-07-17 DIAGNOSIS — G4733 Obstructive sleep apnea (adult) (pediatric): Secondary | ICD-10-CM | POA: Diagnosis not present

## 2018-07-22 ENCOUNTER — Ambulatory Visit (INDEPENDENT_AMBULATORY_CARE_PROVIDER_SITE_OTHER): Payer: Medicaid Other | Admitting: Gastroenterology

## 2018-07-22 ENCOUNTER — Encounter: Payer: Self-pay | Admitting: Gastroenterology

## 2018-07-22 VITALS — BP 128/80 | HR 74 | Ht 63.0 in | Wt 217.2 lb

## 2018-07-22 DIAGNOSIS — Z1211 Encounter for screening for malignant neoplasm of colon: Secondary | ICD-10-CM | POA: Diagnosis not present

## 2018-07-22 DIAGNOSIS — Z9981 Dependence on supplemental oxygen: Secondary | ICD-10-CM | POA: Diagnosis not present

## 2018-07-22 MED ORDER — PEG 3350-KCL-NABCB-NACL-NASULF 236 G PO SOLR: 4000.0000 mL | Freq: Once | ORAL | 0 refills | Status: AC

## 2018-07-22 NOTE — Progress Notes (Signed)
07/22/2018 Leslie Gates [id number] 1965/12/26   HISTORY OF PRESENT ILLNESS:  This is a 52 year old female who is new to our practice.  She is here to discuss colonoscopy.  She's never had one in the past.  She does not have any complaints.  She is on O2 at nighttime so procedure needs to be performed in hospital setting.   Past Medical History:  Diagnosis Date  . Allergic rhinitis   . Allergy   . Anemia   . Anxiety   . Arthritis   . Asthma   . Chronic otitis media   . Coronary artery disease   . Depression   . Diastolic CHF, chronic (HCC)   . Dysphagia   . GERD (gastroesophageal reflux disease)   . History of cervical dysplasia   . History of herpes zoster   . HTN (hypertension)   . Human immunodeficiency virus (HIV) (HCC) 2001  . Hyperlipidemia   . Obesity   . OSA (obstructive sleep apnea)   . Oxygen deficiency   . Pulmonary HTN (HCC)   . Sleep apnea   . Wrist fracture, bilateral    Past Surgical History:  Procedure Laterality Date  . ADENOIDECTOMY    . CESAREAN SECTION    . NASAL SINUS SURGERY  2004   [facility]  . RIGHT HEART CATHETERIZATION N/A 02/01/2012   Procedure: RIGHT HEART CATH;  Surgeon: Dolores Patty, MD;  Location: [facility] CATH LAB;  Service: Cardiovascular;  Laterality: N/A;  . TYMPANOSTOMY TUBE PLACEMENT     as a child  . WRIST FRACTURE SURGERY  2006   bilateral - [facility]    reports that she has never smoked. She has never used smokeless tobacco. She reports that she does not drink alcohol or use drugs. family history includes Alcohol abuse in her brother, father, mother, and sister; Hyperlipidemia in her mother; Hypertension in her brother, father, mother, sister, and unknown relative; Kidney disease in her father; Other in her father; Other (age of onset: 34) in her mother. Allergies  Allergen Reactions  . Lisinopril Swelling    Very swollen bottom lip      Outpatient Encounter Medications as of 07/22/2018  Medication Sig  . amLODipine  (NORVASC) 10 MG tablet Take 1 tablet (10 mg total) by mouth daily.  Marland Kitchen atorvastatin (LIPITOR) 20 MG tablet Take 1 tablet (20 mg total) by mouth daily.  . beclomethasone (BECONASE-AQ) 42 MCG/SPRAY nasal spray Place 1 spray into both nostrils 2 (two) times daily. Dose is for each nostril.  . budesonide-formoterol (SYMBICORT) 160-4.5 MCG/ACT inhaler Inhale 2 puffs into the lungs 2 (two) times daily.  . cetirizine (ZYRTEC) 10 MG tablet TAKE 1 TABLET BY MOUTH DAILY  . elvitegravir-cobicistat-emtricitabine-tenofovir (GENVOYA) 150-150-200-10 MG TABS tablet Take 1 tablet by mouth daily with breakfast.  . furosemide (LASIX) 20 MG tablet Take 1 tablet (20 mg total) by mouth daily. May take extra tablet once daily as needed for weight gain 3 lbs or more overnight.  . montelukast (SINGULAIR) 10 MG tablet Take 1 tablet (10 mg total) by mouth at bedtime.  . Multiple Vitamin (MULTI-VITAMINS) TABS TAKE 1 TABLET BY MOUTH DAILY  . naphazoline-pheniramine (NAPHCON-A) 0.025-0.3 % ophthalmic solution INSTILL 1 DROP INTO BOTH EYES FOUR TIMES A DAY AS NEEDED FOR IRRITATION.  Marland Kitchen omeprazole (PRILOSEC) 40 MG capsule TAKE 1 CAPSULE BY MOUTH DAILY  . OXYGEN Inhale 2 L into the lungs at bedtime.  Marland Kitchen PROAIR HFA 108 (90 Base) MCG/ACT inhaler INHALE 1  OR 2 PUFFS INTO THE LUNGS EVERY 6 HOURS AS NEEDED FOR WHEEZING OR SHORTNESS OF BREATH  . [DISCONTINUED] ibuprofen (ADVIL,MOTRIN) 400 MG tablet Take 1 tablet (400 mg total) by mouth every 6 (six) hours as needed.  . [DISCONTINUED] methocarbamol (ROBAXIN) 500 MG tablet Take 1 tablet (500 mg total) by mouth 2 (two) times daily.   No facility-administered encounter medications on file as of 07/22/2018.      REVIEW OF SYSTEMS  : All other systems reviewed and negative except where noted in the History of Present Illness.   PHYSICAL EXAM: BP 128/80   Pulse 74   Ht 5\' 3"  (1.6 m)   Wt 217 lb 4 oz (98.5 kg)   BMI 38.48 kg/m  General: Well developed black female in no acute  distress Head: Normocephalic and atraumatic Eyes:  Sclerae anicteric, conjunctiva pink. Ears: Normal auditory acuity Lungs: Clear throughout to auscultation; no increased WOB. Heart: Regular rate and rhythm; no M/R/G. Abdomen: Soft, non-distended.  BS present.  Non-tender. Rectal:  Will be done at the time of colonoscopy. Musculoskeletal: Symmetrical with no gross deformities  Skin: No lesions on visible extremities Extremities: No edema  Neurological: Alert oriented x 4, grossly non-focal Psychological:  Alert and cooperative. Normal mood and affect  ASSESSMENT AND PLAN: *Screening colonoscopy:  Never had one in the past.  Needs procedure at hospital due to O2 use at nighttime.  Will schedule with Dr. Meridee Score.    **The risks, benefits, and alternatives to colonoscopy were discussed with the patient and she consents to proceed.   CC:  Leslie Beavers, DO

## 2018-07-22 NOTE — Patient Instructions (Signed)

## 2018-07-23 NOTE — Progress Notes (Signed)
Agree with plan and assessment.  No history of IDA or microcytosis.  Reasonable for colon cancer screening.  Corliss Parish, MD [facility] Office # [phone]

## 2018-08-01 ENCOUNTER — Encounter: Payer: Self-pay | Admitting: Internal Medicine

## 2018-08-01 ENCOUNTER — Ambulatory Visit (INDEPENDENT_AMBULATORY_CARE_PROVIDER_SITE_OTHER): Payer: Medicaid Other | Admitting: Internal Medicine

## 2018-08-01 VITALS — BP 134/84 | HR 68 | Temp 98.2°F | Ht 63.0 in | Wt 220.0 lb

## 2018-08-01 DIAGNOSIS — I10 Essential (primary) hypertension: Secondary | ICD-10-CM | POA: Diagnosis not present

## 2018-08-01 DIAGNOSIS — Z79899 Other long term (current) drug therapy: Secondary | ICD-10-CM | POA: Diagnosis not present

## 2018-08-01 DIAGNOSIS — B20 Human immunodeficiency virus [HIV] disease: Secondary | ICD-10-CM | POA: Diagnosis not present

## 2018-08-01 NOTE — Progress Notes (Signed)
RFV: follow up for HIV disease  Patient ID: Leslie Gates, female   DOB: 1966/05/06, 52 y.o.   MRN: [id number]  HPI Leslie Gates is a 52yo F with well controlled hiv disease.580/VL<20 on genvoya in June. She reports doing well overall. Has not had recent complaints due to her health. Has not had issues with her adherence to medications.  Outpatient Encounter Medications as of 08/01/2018  Medication Sig  . amLODipine (NORVASC) 10 MG tablet Take 1 tablet (10 mg total) by mouth daily.  Marland Kitchen atorvastatin (LIPITOR) 20 MG tablet Take 1 tablet (20 mg total) by mouth daily.  . beclomethasone (BECONASE-AQ) 42 MCG/SPRAY nasal spray Place 1 spray into both nostrils 2 (two) times daily. Dose is for each nostril.  . budesonide-formoterol (SYMBICORT) 160-4.5 MCG/ACT inhaler Inhale 2 puffs into the lungs 2 (two) times daily.  . cetirizine (ZYRTEC) 10 MG tablet TAKE 1 TABLET BY MOUTH DAILY  . elvitegravir-cobicistat-emtricitabine-tenofovir (GENVOYA) 150-150-200-10 MG TABS tablet Take 1 tablet by mouth daily with breakfast.  . furosemide (LASIX) 20 MG tablet Take 1 tablet (20 mg total) by mouth daily. May take extra tablet once daily as needed for weight gain 3 lbs or more overnight.  . montelukast (SINGULAIR) 10 MG tablet Take 1 tablet (10 mg total) by mouth at bedtime.  . Multiple Vitamin (MULTI-VITAMINS) TABS TAKE 1 TABLET BY MOUTH DAILY  . naphazoline-pheniramine (NAPHCON-A) 0.025-0.3 % ophthalmic solution INSTILL 1 DROP INTO BOTH EYES FOUR TIMES A DAY AS NEEDED FOR IRRITATION.  Marland Kitchen omeprazole (PRILOSEC) 40 MG capsule TAKE 1 CAPSULE BY MOUTH DAILY  . OXYGEN Inhale 2 L into the lungs at bedtime.  Marland Kitchen PROAIR HFA 108 (90 Base) MCG/ACT inhaler INHALE 1 OR 2 PUFFS INTO THE LUNGS EVERY 6 HOURS AS NEEDED FOR WHEEZING OR SHORTNESS OF BREATH   No facility-administered encounter medications on file as of 08/01/2018.      Patient Active Problem List   Diagnosis Date Noted  . Special screening for malignant neoplasms,  colon 07/22/2018  . O2 dependent 07/22/2018  . Hyperlipidemia 04/29/2018  . Chronic diastolic heart failure (HCC) 06/25/2017  . Sensorineural hearing loss (SNHL), bilateral 02/24/2017  . Tinnitus, bilateral 02/24/2017  . Prediabetes 04/16/2016  . Moderate dysplasia of cervix 07/10/2013  . History of angioedema 09/09/2011  . Obesity hypoventilation syndrome (HCC) 07/10/2010  . Morbid obesity (HCC) 07/01/2010  . Obstructive sleep apnea 06/27/2010  . Pulmonary hypertension (HCC) 05/28/2010  . Allergic rhinitis 03/14/2008  . Chronic otitis media 03/22/2007  . Primary hypertension 03/22/2007  . HIV disease (HCC) 06/21/2002     Health Maintenance Due  Topic Date Due  . FOOT EXAM  07/05/1977  . OPHTHALMOLOGY EXAM  07/05/1977  . URINE MICROALBUMIN  07/05/1977  . TETANUS/TDAP  07/05/1986  . COLONOSCOPY  07/05/2017  . INFLUENZA VACCINE  06/16/2018     Review of Review of Systems  Constitutional: Negative for fever, chills, diaphoresis, activity change, appetite change, fatigue and unexpected weight change.  HENT: Negative for congestion, sore throat, rhinorrhea, sneezing, trouble swallowing and sinus pressure.  Eyes: Negative for photophobia and visual disturbance.  Respiratory: Negative for cough, chest tightness, shortness of breath, wheezing and stridor.  Cardiovascular: Negative for chest pain, palpitations and leg swelling.  Gastrointestinal: Negative for nausea, vomiting, abdominal pain, diarrhea, constipation, blood in stool, abdominal distention and anal bleeding.  Genitourinary: Negative for dysuria, hematuria, flank pain and difficulty urinating.  Musculoskeletal: Negative for myalgias, back pain, joint swelling, arthralgias and gait problem.  Skin: Negative for color change,  pallor, rash and wound.  Neurological: Negative for dizziness, tremors, weakness and light-headedness.  Hematological: Negative for adenopathy. Does not bruise/bleed easily.  Psychiatric/Behavioral:  Negative for behavioral problems, confusion, sleep disturbance, dysphoric mood, decreased concentration and agitation.    Physical Exam   BP 134/84   Pulse 68   Temp 98.2 F (36.8 C)   Ht 5\' 3"  (1.6 m)   Wt 220 lb (99.8 kg)   BMI 38.97 kg/m   Physical Exam  Constitutional:  oriented to person, place, and time. appears well-developed and well-nourished. No distress.  HENT: [place]/AT, PERRLA, no scleral icterus Mouth/Throat: Oropharynx is clear and moist. No oropharyngeal exudate.  Cardiovascular: Normal rate, regular rhythm and normal heart sounds. Exam reveals no gallop and no friction rub.  No murmur heard.  Pulmonary/Chest: Effort normal and breath sounds normal. No respiratory distress.  has no wheezes.  Neck = supple, no nuchal rigidity Abdominal: Soft. Bowel sounds are normal.  exhibits no distension. There is no tenderness.  Lymphadenopathy: no cervical adenopathy. No axillary adenopathy Neurological: alert and oriented to person, place, and time.  Skin: Skin is warm and dry. No rash noted. No erythema.  Psychiatric: a normal mood and affect.  behavior is normal.   Lab Results  Component Value Date   CD4TCELL 28 (L) 04/28/2018   Lab Results  Component Value Date   CD4TABS 580 04/28/2018   CD4TABS 760 12/20/2017   CD4TABS 670 03/30/2017   Lab Results  Component Value Date   HIV1RNAQUANT <20 NOT DETECTED 04/28/2018   Lab Results  Component Value Date   HEPBSAB NEG 02/08/2009   Lab Results  Component Value Date   LABRPR NON REAC 03/30/2017    CBC Lab Results  Component Value Date   WBC 3.8 04/28/2018   RBC 4.66 04/28/2018   HGB 13.4 04/28/2018   HCT 39.6 04/28/2018   PLT 331 04/28/2018   MCV 85.0 04/28/2018   MCH 28.8 04/28/2018   MCHC 33.8 04/28/2018   RDW 13.3 04/28/2018   LYMPHSABS 2,090 04/28/2018   MONOABS 517 03/30/2017   EOSABS 99 04/28/2018    BMET Lab Results  Component Value Date   NA 139 04/28/2018   K 4.4 04/28/2018   CL 101  04/28/2018   CO2 33 (H) 04/28/2018   GLUCOSE 106 (H) 04/28/2018   BUN 14 04/28/2018   CREATININE 0.78 04/28/2018   CALCIUM 9.8 04/28/2018   GFRNONAA 89 04/28/2018   GFRAA 103 04/28/2018      Assessment and Plan  hiv disease = well controlled. Continue on genvoya.   Long term medication management = cr is stable  HTN = well controlled on current regimen

## 2018-08-05 ENCOUNTER — Ambulatory Visit: Payer: Medicaid Other

## 2018-08-16 DIAGNOSIS — R0902 Hypoxemia: Secondary | ICD-10-CM | POA: Diagnosis not present

## 2018-08-16 DIAGNOSIS — G4733 Obstructive sleep apnea (adult) (pediatric): Secondary | ICD-10-CM | POA: Diagnosis not present

## 2018-08-16 DIAGNOSIS — B20 Human immunodeficiency virus [HIV] disease: Secondary | ICD-10-CM | POA: Diagnosis not present

## 2018-08-16 DIAGNOSIS — R0602 Shortness of breath: Secondary | ICD-10-CM | POA: Diagnosis not present

## 2018-08-23 ENCOUNTER — Telehealth: Payer: Self-pay | Admitting: Gastroenterology

## 2018-08-23 ENCOUNTER — Telehealth: Payer: Self-pay | Admitting: Pulmonary Disease

## 2018-08-23 ENCOUNTER — Telehealth: Payer: Self-pay

## 2018-08-23 ENCOUNTER — Other Ambulatory Visit: Payer: Self-pay | Admitting: Pulmonary Disease

## 2018-08-23 MED ORDER — MONTELUKAST SODIUM 10 MG PO TABS: 10.0000 mg | ORAL_TABLET | Freq: Every day | ORAL | 5 refills | Status: DC

## 2018-08-23 NOTE — Telephone Encounter (Signed)
This may just be an incidental abnormal cycle.  I would see what the next few cycles are like.  No treatment is necessary at this time.

## 2018-08-23 NOTE — Telephone Encounter (Signed)
Patient called and she is now having a menstrual cycle that is "very heavy" after not having a cycle since July of this year. She would like to have some advice as to if this is normal or not.

## 2018-08-23 NOTE — Telephone Encounter (Signed)
Called [facility] pharmacy, informed them refill has been sent. Nothing further is needed at this time.

## 2018-08-24 ENCOUNTER — Ambulatory Visit: Payer: Medicaid Other | Admitting: Obstetrics

## 2018-08-24 NOTE — Telephone Encounter (Signed)
Left message on machine to call back  

## 2018-08-25 ENCOUNTER — Telehealth: Payer: Self-pay | Admitting: Gastroenterology

## 2018-08-25 NOTE — Telephone Encounter (Signed)
The pt has decided to keep the appt as planned  

## 2018-08-25 NOTE — Telephone Encounter (Signed)
See alternate note  

## 2018-08-25 NOTE — Telephone Encounter (Signed)
Pt needs to r/s procedure scheduled for Monday 08/29/18 with Dr. Meridee Score at [facility]. Pls call her.

## 2018-08-30 ENCOUNTER — Other Ambulatory Visit: Payer: Self-pay | Admitting: Pulmonary Disease

## 2018-09-16 DIAGNOSIS — B20 Human immunodeficiency virus [HIV] disease: Secondary | ICD-10-CM | POA: Diagnosis not present

## 2018-09-16 DIAGNOSIS — R0902 Hypoxemia: Secondary | ICD-10-CM | POA: Diagnosis not present

## 2018-09-16 DIAGNOSIS — G4733 Obstructive sleep apnea (adult) (pediatric): Secondary | ICD-10-CM | POA: Diagnosis not present

## 2018-09-16 DIAGNOSIS — R0602 Shortness of breath: Secondary | ICD-10-CM | POA: Diagnosis not present

## 2018-09-26 ENCOUNTER — Other Ambulatory Visit: Payer: Self-pay | Admitting: Pulmonary Disease

## 2018-09-27 ENCOUNTER — Encounter: Payer: Self-pay | Admitting: Gastroenterology

## 2018-09-29 ENCOUNTER — Encounter (HOSPITAL_COMMUNITY): Payer: Self-pay | Admitting: *Deleted

## 2018-09-30 ENCOUNTER — Encounter (HOSPITAL_COMMUNITY): Payer: Self-pay | Admitting: Physician Assistant

## 2018-09-30 ENCOUNTER — Encounter (HOSPITAL_COMMUNITY): Payer: Self-pay | Admitting: Urology

## 2018-10-02 NOTE — Anesthesia Preprocedure Evaluation (Deleted)
Anesthesia Evaluation    Reviewed: Allergy & Precautions, Patient's Chart, lab work & pertinent test results  History of Anesthesia Complications Negative for: history of anesthetic complications  Airway        Dental   Pulmonary asthma , sleep apnea ,           Cardiovascular hypertension, Pt. on medications + CAD     '19 TTE - EF 60 to 65%. Mild TR. PA peak pressure: 39 mm Hg (S).    Neuro/Psych PSYCHIATRIC DISORDERS Anxiety Depression negative neurological ROS     GI/Hepatic Neg liver ROS, GERD  Medicated,  Endo/Other  Morbid obesity  Renal/GU negative Renal ROS     Musculoskeletal  (+) Arthritis ,   Abdominal   Peds  Hematology  (+) HIV,   Anesthesia Other Findings   Reproductive/Obstetrics                             Anesthesia Physical Anesthesia Plan  ASA: III  Anesthesia Plan: MAC   Post-op Pain Management:    Induction: Intravenous  PONV Risk Score and Plan: 2 and Propofol infusion and Treatment may vary due to age or medical condition  Airway Management Planned: Nasal Cannula and Natural Airway  Additional Equipment: None  Intra-op Plan:   Post-operative Plan:   Informed Consent:   Plan Discussed with: CRNA and Anesthesiologist  Anesthesia Plan Comments: ( Patient has not taken amlodipine in 3 days. BP >210/115 in preop. Procedure postponed due to uncontrolled HTN. BP treated. Patient instructed to contact PCP for medication management, instructed to come to ED if developing severe headaches, difficulty with vision, chest pain, etc. )       Anesthesia Quick Evaluation

## 2018-10-03 ENCOUNTER — Other Ambulatory Visit: Payer: Self-pay

## 2018-10-03 ENCOUNTER — Encounter (HOSPITAL_COMMUNITY): Payer: Self-pay | Admitting: *Deleted

## 2018-10-03 ENCOUNTER — Telehealth: Payer: Self-pay

## 2018-10-03 ENCOUNTER — Encounter (HOSPITAL_COMMUNITY)
Admission: RE | Disposition: A | Discharge status: Home / Self Care | Payer: Self-pay | Source: Ambulatory Visit | Attending: Gastroenterology

## 2018-10-03 ENCOUNTER — Ambulatory Visit (HOSPITAL_COMMUNITY)
Admission: RE | Admit: 2018-10-03 | Discharge: 2018-10-03 | Disposition: A | Discharge status: Home / Self Care | Payer: Medicaid Other | Source: Ambulatory Visit | Attending: Gastroenterology | Admitting: Gastroenterology

## 2018-10-03 DIAGNOSIS — Z1211 Encounter for screening for malignant neoplasm of colon: Secondary | ICD-10-CM

## 2018-10-03 DIAGNOSIS — Z5309 Procedure and treatment not carried out because of other contraindication: Secondary | ICD-10-CM | POA: Diagnosis not present

## 2018-10-03 DIAGNOSIS — Z9981 Dependence on supplemental oxygen: Secondary | ICD-10-CM

## 2018-10-03 SURGERY — CANCELLED PROCEDURE
Anesthesia: Monitor Anesthesia Care

## 2018-10-03 MED ORDER — HYDRALAZINE HCL 20 MG/ML IJ SOLN
10.0000 mg | Freq: Once | INTRAMUSCULAR | Status: AC
  Administered 2018-10-03: 10 mg via INTRAVENOUS

## 2018-10-03 MED ORDER — HYDRALAZINE HCL 20 MG/ML IJ SOLN
INTRAMUSCULAR | Status: AC
  Filled 2018-10-03: qty 1

## 2018-10-03 MED ORDER — SODIUM CHLORIDE 0.9 % IV SOLN: INTRAVENOUS | Status: DC

## 2018-10-03 SURGICAL SUPPLY — 22 items

## 2018-10-03 NOTE — H&P (Addendum)
Patient came in for planned colonoscopy for colon cancer screening. Bps in the 200s/110s. Patient had self discontinued for the last 2-3 days Norvasc due to concern of prepping and not eating entirely. She was evaluated by Anesthesia and not felt to be a candidate for procedure today even with preparation having been completed. She is receiving Hydralazine currently. BPs to be monitored and will see if we can get them down safely enough for discharge vs ED evaluation. She was instructed in future attempt of procedure to proceed with taking all of her BP medications so that this would not occur. I spoke with patient and her cardiovascular examination is grossly normal and she shows no evidence of malignant hypertension symptoms, however we need to monitor Bps. Will plan to reschedule procedure for after the holidays as this is a screening procedure in January/February and we will also plan to allow patient to have a Miralax preparation to decrease volume that patient needs to ingest to decrease risk of nausea and vomiting. Patient and husband appreciative for discussion this AM, frustrated by situation, but OK when stable for discharge and rescheduling. Will await protocol for Bps prior to discharge from unit.   Leslie ParishGabriel Mansouraty, MD [facility] Office # [phone]

## 2018-10-03 NOTE — Telephone Encounter (Signed)
Recall has been added to Epic

## 2018-10-03 NOTE — Telephone Encounter (Signed)
-----   Message from Lemar LoftyGabriel Mansouraty Jr., MD sent at 10/03/2018  7:43 AM EST ----- Regarding: Patient for rescheduling Dear Alexia FreestonePatty, Can you please reach out to patient in December for a January/February Colonoscopy recall? Her blood pressures were too high for us to proceed with colonoscopy per Anesthesia. Patient will need to be done in hospital. Also, she can use Miralax preparation, hopefully she will tolerate this better. Thanks. Liz BeachGabe

## 2018-10-03 NOTE — Progress Notes (Signed)
BP 206/116 in pre-procedure for outpatient colonoscopy today. Dr Mal AmabileBrock at bedside verbal order received for Hydralazine 10mg  IV x1 dose now. Patient reports not taking home dose of amlodipine since Friday 09/30/18. Dr. Mal AmabileBrock states procedure cancelled today due to anesthesia risk. Will continue to monitor BP.

## 2018-10-03 NOTE — Progress Notes (Signed)
Call placed to Dr. Mal AmabileBrock, advised BP 163/95 at this time. Verbal order to discharge patient to home. Patient states she will resume home medications as presecribed.

## 2018-10-06 ENCOUNTER — Other Ambulatory Visit: Payer: Self-pay

## 2018-10-06 ENCOUNTER — Other Ambulatory Visit: Payer: Self-pay | Admitting: *Deleted

## 2018-10-06 ENCOUNTER — Ambulatory Visit: Payer: Medicaid Other | Admitting: Family Medicine

## 2018-10-06 ENCOUNTER — Other Ambulatory Visit: Payer: Medicaid Other

## 2018-10-06 ENCOUNTER — Ambulatory Visit (INDEPENDENT_AMBULATORY_CARE_PROVIDER_SITE_OTHER): Payer: Medicaid Other | Admitting: Student in an Organized Health Care Education/Training Program

## 2018-10-06 ENCOUNTER — Ambulatory Visit: Payer: Medicaid Other | Admitting: *Deleted

## 2018-10-06 VITALS — BP 161/105 | HR 69

## 2018-10-06 VITALS — BP 140/90 | HR 76 | Temp 98.6°F | Ht 63.0 in | Wt 219.6 lb

## 2018-10-06 DIAGNOSIS — I10 Essential (primary) hypertension: Secondary | ICD-10-CM

## 2018-10-06 DIAGNOSIS — Z79899 Other long term (current) drug therapy: Secondary | ICD-10-CM | POA: Diagnosis not present

## 2018-10-06 DIAGNOSIS — Z113 Encounter for screening for infections with a predominantly sexual mode of transmission: Secondary | ICD-10-CM

## 2018-10-06 DIAGNOSIS — B20 Human immunodeficiency virus [HIV] disease: Secondary | ICD-10-CM | POA: Diagnosis not present

## 2018-10-06 NOTE — Progress Notes (Signed)
Patient stated she is going to go to her PCP to see if she can reschedule the appointment she cancelled today for blood pressure management. Leslie Gates, Darion Milewski M, RN

## 2018-10-06 NOTE — Progress Notes (Signed)
   CC: HTN  HPI: Leslie Gates is a 52 y.o. female   Micah FlesherWent for a colonoscopy Monday and blood pressure was elevated, systolic in 200s. Had stopped taking meds for 3 days. States she was concerned the antihypertensives would make her nauseous since she was NPO for colonoscopy and she does not usually take these on an empty stomach. Colonoscopy was not completed.  Patient resumed her blood pressure medications. Normal pressure today in office. No headaches, blurred vision, chest pain or dyspnea. Takes lasix 20 mg daily.  Review of Symptoms:  See HPI for ROS.   CC, SH/smoking status, and VS noted.  Objective: BP (!) 142/86   Pulse 76   Temp 98.6 F (37 C) (Oral)   Ht 5\' 3"  (1.6 m)   Wt 219 lb 9.6 oz (99.6 kg)   LMP 09/30/2018   SpO2 91%   BMI 38.90 kg/m  GEN: NAD, alert, cooperative, and pleasant. EYE: no conjunctival injection, pupils equally round and reactive to light ENMT: normal tympanic light reflex, no nasal polyps,no rhinorrhea, no pharyngeal erythema or exudates NECK: full ROM, no thyromegaly RESPIRATORY: clear to auscultation bilaterally with no wheezes, rhonchi or rales, good effort CV: RRR, no m/r/g GI: soft, non-tender, non-distended, no hepatosplenomegaly SKIN: warm and dry, no rashes or lesions NEURO: II-XII grossly intact PSYCH: AAOx3   Assessment and plan:  1. HTN - controlled today. No signs of hypertensive urgency or emergency. Continue current management. Patient to call if she decides to stop her meds for any reason in the future.  Leslie PouchLauren Ryenn Howeth, MD,MS,  PGY3 10/06/2018 4:44 PM

## 2018-10-06 NOTE — Patient Instructions (Signed)
It was a pleasure seeing you today in our clinic.   Please continue taking your blood pressure meeting.  Let us know if you develop chest pain, shortness of breath, or high blood pressures while taking your medicine.  Our clinic's number is [phone]. Please call with questions or concerns about what we discussed today.  Be well, Dr. Mosetta PuttFeng

## 2018-10-07 ENCOUNTER — Encounter: Payer: Self-pay | Admitting: Student in an Organized Health Care Education/Training Program

## 2018-10-07 LAB — T-HELPER CELL (CD4) - (RCID CLINIC ONLY)
CD4 T CELL ABS: 560 /uL (ref 400–2700)
CD4 T CELL HELPER: 26 % — AB (ref 33–55)

## 2018-10-10 LAB — CBC WITH DIFFERENTIAL/PLATELET
BASOS PCT: 1.6 %
Basophils Absolute: 61 cells/uL (ref 0–200)
EOS ABS: 171 {cells}/uL (ref 15–500)
EOS PCT: 4.5 %
HCT: 37.8 % (ref 35.0–45.0)
HEMOGLOBIN: 12.6 g/dL (ref 11.7–15.5)
Lymphs Abs: 2094 cells/uL (ref 850–3900)
MCH: 30 pg (ref 27.0–33.0)
MCHC: 33.3 g/dL (ref 32.0–36.0)
MCV: 90 fL (ref 80.0–100.0)
MONOS PCT: 9.3 %
MPV: 10.5 fL (ref 7.5–12.5)
NEUTROS ABS: 1121 {cells}/uL — AB (ref 1500–7800)
Neutrophils Relative %: 29.5 %
PLATELETS: 331 10*3/uL (ref 140–400)
RBC: 4.2 10*6/uL (ref 3.80–5.10)
RDW: 12.3 % (ref 11.0–15.0)
Total Lymphocyte: 55.1 %
WBC mixed population: 353 cells/uL (ref 200–950)
WBC: 3.8 10*3/uL (ref 3.8–10.8)

## 2018-10-10 LAB — LIPID PANEL
Cholesterol: 164 mg/dL (ref <200)
HDL: 56 mg/dL (ref >50)
LDL Cholesterol (Calc): 89 mg/dL (calc)
NON-HDL CHOLESTEROL (CALC): 108 mg/dL (ref <130)
TRIGLYCERIDES: 99 mg/dL (ref <150)
Total CHOL/HDL Ratio: 2.9 (calc) (ref <5.0)

## 2018-10-10 LAB — RPR: RPR: NONREACTIVE

## 2018-10-10 LAB — COMPLETE METABOLIC PANEL WITH GFR
AG RATIO: 1.4 (calc) (ref 1.0–2.5)
ALKALINE PHOSPHATASE (APISO): 67 U/L (ref 33–130)
ALT: 17 U/L (ref 6–29)
AST: 23 U/L (ref 10–35)
Albumin: 4.5 g/dL (ref 3.6–5.1)
BILIRUBIN TOTAL: 0.4 mg/dL (ref 0.2–1.2)
BUN: 10 mg/dL (ref 7–25)
CO2: 34 mmol/L — ABNORMAL HIGH (ref 20–32)
Calcium: 9.7 mg/dL (ref 8.6–10.4)
Chloride: 102 mmol/L (ref 98–110)
Creat: 0.81 mg/dL (ref 0.50–1.05)
GFR, Est African American: 97 mL/min/{1.73_m2} (ref >=60)
GFR, Est Non African American: 84 mL/min/{1.73_m2} (ref >=60)
GLUCOSE: 94 mg/dL (ref 65–99)
Globulin: 3.3 g/dL (calc) (ref 1.9–3.7)
POTASSIUM: 4.1 mmol/L (ref 3.5–5.3)
Sodium: 143 mmol/L (ref 135–146)
Total Protein: 7.8 g/dL (ref 6.1–8.1)

## 2018-10-10 LAB — HIV-1 RNA QUANT-NO REFLEX-BLD
HIV 1 RNA Quant: 32 copies/mL — ABNORMAL HIGH
HIV-1 RNA QUANT, LOG: 1.51 {Log_copies}/mL — AB

## 2018-10-16 DIAGNOSIS — B20 Human immunodeficiency virus [HIV] disease: Secondary | ICD-10-CM | POA: Diagnosis not present

## 2018-10-16 DIAGNOSIS — G4733 Obstructive sleep apnea (adult) (pediatric): Secondary | ICD-10-CM | POA: Diagnosis not present

## 2018-10-16 DIAGNOSIS — R0902 Hypoxemia: Secondary | ICD-10-CM | POA: Diagnosis not present

## 2018-10-16 DIAGNOSIS — R0602 Shortness of breath: Secondary | ICD-10-CM | POA: Diagnosis not present

## 2018-10-25 ENCOUNTER — Other Ambulatory Visit: Payer: Self-pay | Admitting: Pulmonary Disease

## 2018-10-25 ENCOUNTER — Other Ambulatory Visit: Payer: Self-pay | Admitting: Family Medicine

## 2018-10-26 ENCOUNTER — Encounter: Payer: Medicaid Other | Admitting: Internal Medicine

## 2018-11-16 DIAGNOSIS — G4733 Obstructive sleep apnea (adult) (pediatric): Secondary | ICD-10-CM | POA: Diagnosis not present

## 2018-11-16 DIAGNOSIS — R0902 Hypoxemia: Secondary | ICD-10-CM | POA: Diagnosis not present

## 2018-11-16 DIAGNOSIS — R0602 Shortness of breath: Secondary | ICD-10-CM | POA: Diagnosis not present

## 2018-11-16 DIAGNOSIS — B20 Human immunodeficiency virus [HIV] disease: Secondary | ICD-10-CM | POA: Diagnosis not present

## 2018-11-21 ENCOUNTER — Other Ambulatory Visit: Payer: Self-pay | Admitting: Family Medicine

## 2018-11-21 DIAGNOSIS — I5032 Chronic diastolic (congestive) heart failure: Secondary | ICD-10-CM

## 2018-12-17 DIAGNOSIS — B20 Human immunodeficiency virus [HIV] disease: Secondary | ICD-10-CM | POA: Diagnosis not present

## 2018-12-17 DIAGNOSIS — R0902 Hypoxemia: Secondary | ICD-10-CM | POA: Diagnosis not present

## 2018-12-17 DIAGNOSIS — R0602 Shortness of breath: Secondary | ICD-10-CM | POA: Diagnosis not present

## 2018-12-17 DIAGNOSIS — G4733 Obstructive sleep apnea (adult) (pediatric): Secondary | ICD-10-CM | POA: Diagnosis not present

## 2018-12-22 ENCOUNTER — Other Ambulatory Visit: Payer: Self-pay | Admitting: Pulmonary Disease

## 2018-12-22 ENCOUNTER — Other Ambulatory Visit: Payer: Self-pay | Admitting: Family Medicine

## 2018-12-22 DIAGNOSIS — G4733 Obstructive sleep apnea (adult) (pediatric): Secondary | ICD-10-CM | POA: Diagnosis not present

## 2018-12-22 DIAGNOSIS — I5032 Chronic diastolic (congestive) heart failure: Secondary | ICD-10-CM

## 2018-12-29 ENCOUNTER — Ambulatory Visit: Payer: Medicaid Other | Admitting: Internal Medicine

## 2019-01-15 DIAGNOSIS — R062 Wheezing: Secondary | ICD-10-CM | POA: Diagnosis not present

## 2019-01-15 DIAGNOSIS — B20 Human immunodeficiency virus [HIV] disease: Secondary | ICD-10-CM | POA: Diagnosis not present

## 2019-01-15 DIAGNOSIS — G4733 Obstructive sleep apnea (adult) (pediatric): Secondary | ICD-10-CM | POA: Diagnosis not present

## 2019-01-15 DIAGNOSIS — R0902 Hypoxemia: Secondary | ICD-10-CM | POA: Diagnosis not present

## 2019-01-19 ENCOUNTER — Other Ambulatory Visit: Payer: Self-pay | Admitting: Family Medicine

## 2019-01-19 DIAGNOSIS — I5032 Chronic diastolic (congestive) heart failure: Secondary | ICD-10-CM

## 2019-01-25 ENCOUNTER — Ambulatory Visit: Payer: Medicaid Other | Admitting: Internal Medicine

## 2019-02-15 DIAGNOSIS — R0902 Hypoxemia: Secondary | ICD-10-CM | POA: Diagnosis not present

## 2019-02-15 DIAGNOSIS — G4733 Obstructive sleep apnea (adult) (pediatric): Secondary | ICD-10-CM | POA: Diagnosis not present

## 2019-02-15 DIAGNOSIS — R062 Wheezing: Secondary | ICD-10-CM | POA: Diagnosis not present

## 2019-02-15 DIAGNOSIS — B20 Human immunodeficiency virus [HIV] disease: Secondary | ICD-10-CM | POA: Diagnosis not present

## 2019-02-16 ENCOUNTER — Ambulatory Visit: Payer: Medicaid Other | Admitting: Family

## 2019-03-17 DIAGNOSIS — R062 Wheezing: Secondary | ICD-10-CM | POA: Diagnosis not present

## 2019-03-17 DIAGNOSIS — R0902 Hypoxemia: Secondary | ICD-10-CM | POA: Diagnosis not present

## 2019-03-17 DIAGNOSIS — G4733 Obstructive sleep apnea (adult) (pediatric): Secondary | ICD-10-CM | POA: Diagnosis not present

## 2019-03-17 DIAGNOSIS — B20 Human immunodeficiency virus [HIV] disease: Secondary | ICD-10-CM | POA: Diagnosis not present

## 2019-03-28 ENCOUNTER — Other Ambulatory Visit: Payer: Self-pay | Admitting: Internal Medicine

## 2019-03-28 DIAGNOSIS — B20 Human immunodeficiency virus [HIV] disease: Secondary | ICD-10-CM

## 2019-04-11 ENCOUNTER — Other Ambulatory Visit: Payer: Self-pay | Admitting: Family Medicine

## 2019-04-11 ENCOUNTER — Other Ambulatory Visit: Payer: Self-pay | Admitting: Internal Medicine

## 2019-04-11 DIAGNOSIS — I5032 Chronic diastolic (congestive) heart failure: Secondary | ICD-10-CM

## 2019-04-11 DIAGNOSIS — B20 Human immunodeficiency virus [HIV] disease: Secondary | ICD-10-CM

## 2019-04-12 ENCOUNTER — Other Ambulatory Visit: Payer: Medicaid Other

## 2019-04-12 ENCOUNTER — Other Ambulatory Visit: Payer: Self-pay

## 2019-04-12 DIAGNOSIS — B20 Human immunodeficiency virus [HIV] disease: Secondary | ICD-10-CM | POA: Diagnosis not present

## 2019-04-13 LAB — T-HELPER CELL (CD4) - (RCID CLINIC ONLY)
CD4 % Helper T Cell: 29 % — ABNORMAL LOW (ref 33–65)
CD4 T Cell Abs: 739 /uL (ref 400–1790)

## 2019-04-17 DIAGNOSIS — R0902 Hypoxemia: Secondary | ICD-10-CM | POA: Diagnosis not present

## 2019-04-17 DIAGNOSIS — G4733 Obstructive sleep apnea (adult) (pediatric): Secondary | ICD-10-CM | POA: Diagnosis not present

## 2019-04-17 DIAGNOSIS — R062 Wheezing: Secondary | ICD-10-CM | POA: Diagnosis not present

## 2019-04-17 DIAGNOSIS — B20 Human immunodeficiency virus [HIV] disease: Secondary | ICD-10-CM | POA: Diagnosis not present

## 2019-04-18 LAB — COMPLETE METABOLIC PANEL WITH GFR
AG Ratio: 1.2 (calc) (ref 1.0–2.5)
ALT: 12 U/L (ref 6–29)
AST: 15 U/L (ref 10–35)
Albumin: 4.3 g/dL (ref 3.6–5.1)
Alkaline phosphatase (APISO): 65 U/L (ref 37–153)
BUN: 20 mg/dL (ref 7–25)
CO2: 27 mmol/L (ref 20–32)
Calcium: 10.2 mg/dL (ref 8.6–10.4)
Chloride: 102 mmol/L (ref 98–110)
Creat: 0.88 mg/dL (ref 0.50–1.05)
GFR, Est African American: 88 mL/min/{1.73_m2} (ref >=60)
GFR, Est Non African American: 76 mL/min/{1.73_m2} (ref >=60)
Globulin: 3.5 g/dL (calc) (ref 1.9–3.7)
Glucose, Bld: 100 mg/dL — ABNORMAL HIGH (ref 65–99)
Potassium: 4.7 mmol/L (ref 3.5–5.3)
Sodium: 138 mmol/L (ref 135–146)
Total Bilirubin: 0.3 mg/dL (ref 0.2–1.2)
Total Protein: 7.8 g/dL (ref 6.1–8.1)

## 2019-04-18 LAB — CBC WITH DIFFERENTIAL/PLATELET
Absolute Monocytes: 519 cells/uL (ref 200–950)
Basophils Absolute: 71 cells/uL (ref 0–200)
Basophils Relative: 1.2 %
Eosinophils Absolute: 207 cells/uL (ref 15–500)
Eosinophils Relative: 3.5 %
HCT: 39 % (ref 35.0–45.0)
Hemoglobin: 12.8 g/dL (ref 11.7–15.5)
Lymphs Abs: 2791 cells/uL (ref 850–3900)
MCH: 28.8 pg (ref 27.0–33.0)
MCHC: 32.8 g/dL (ref 32.0–36.0)
MCV: 87.6 fL (ref 80.0–100.0)
MPV: 10.3 fL (ref 7.5–12.5)
Monocytes Relative: 8.8 %
Neutro Abs: 2313 cells/uL (ref 1500–7800)
Neutrophils Relative %: 39.2 %
Platelets: 311 10*3/uL (ref 140–400)
RBC: 4.45 10*6/uL (ref 3.80–5.10)
RDW: 13.3 % (ref 11.0–15.0)
Total Lymphocyte: 47.3 %
WBC: 5.9 10*3/uL (ref 3.8–10.8)

## 2019-04-18 LAB — HIV-1 RNA QUANT-NO REFLEX-BLD
HIV 1 RNA Quant: <20 copies/mL — AB
HIV-1 RNA Quant, Log: <1.3 Log copies/mL — AB

## 2019-04-19 ENCOUNTER — Telehealth: Payer: Self-pay

## 2019-04-19 NOTE — Telephone Encounter (Signed)
Returned call to [facility] spoke with Jazmine in call center. Made aware last ov was May 2019. Pt is due for compliance visit and [employer] will reach out to her. Left message for patient to call office and schedule appt. Nothing further needed.

## 2019-04-24 DIAGNOSIS — G4733 Obstructive sleep apnea (adult) (pediatric): Secondary | ICD-10-CM | POA: Diagnosis not present

## 2019-04-26 ENCOUNTER — Encounter: Payer: Medicaid Other | Admitting: Internal Medicine

## 2019-04-27 ENCOUNTER — Other Ambulatory Visit: Payer: Self-pay

## 2019-04-27 ENCOUNTER — Encounter: Payer: Self-pay | Admitting: Primary Care

## 2019-04-27 ENCOUNTER — Ambulatory Visit (INDEPENDENT_AMBULATORY_CARE_PROVIDER_SITE_OTHER): Payer: Medicaid Other | Admitting: Primary Care

## 2019-04-27 VITALS — BP 126/94 | HR 68 | Temp 98.4°F | Ht 63.0 in | Wt 216.8 lb

## 2019-04-27 DIAGNOSIS — E662 Morbid (severe) obesity with alveolar hypoventilation: Secondary | ICD-10-CM | POA: Diagnosis not present

## 2019-04-27 DIAGNOSIS — G4733 Obstructive sleep apnea (adult) (pediatric): Secondary | ICD-10-CM

## 2019-04-27 DIAGNOSIS — Z9989 Dependence on other enabling machines and devices: Secondary | ICD-10-CM | POA: Diagnosis not present

## 2019-04-27 NOTE — Assessment & Plan Note (Signed)
-   Chronic respiratory failure d/t OHS - Continue 2L oxygen with CPAP - Encourage weight loss and staying active

## 2019-04-27 NOTE — Progress Notes (Addendum)
 @Patient  ID: Leslie Gates, female    DOB: [birth year], 53 y.o.   MRN: [id number]  Chief Complaint  Patient presents with  . Follow-up    cpap f/u    Referring provider: Wendee BeaversMcMullen, David J, DO  HPI: 53 year old female, never smoked. PMH OSA, OHS, asthma. Patient of Dr. Craige CottaSood, last seen on 04/12/18. PSG 06/08/04 >> RDI 92.1, oxygen nadir 57%. Split night 12/22/13 >> AHI 27, SaO2 low 77%.  Maintained on CPAP at 13cm H20 with 2L supplemental oxygen.   04/27/2019 Patient presents today for annual OSA follow-up. She is doing very well, no acute symptoms. Breathing ok. Using Symbicort twice daily as needed, needs to use it more during hot weather. Rare rescue inhaler use. No significant cough or sinus congestion. She is compliant with CPAP use and reports benefit from therapy. Sleeping well at night. Her machine has not been working well recently, states that it stops and shakes a little bit. Using full face mask.   Airview download 30 days - Usage 30/30 days; 100% > 4 hours - Pressure 13cm H20  - Min air leaks - AHI 3.8   Allergies  Allergen Reactions  . Lisinopril Swelling    Very swollen bottom lip    Immunization History  Administered Date(s) Administered  . HPV 9-valent 04/28/2018  . Hepatitis B 11/17/1999, 09/12/2003, 09/29/2004  . Influenza Split 08/12/2011, 08/13/2017  . Influenza Whole 08/19/2005, 08/17/2007, 05/14/2010, 08/18/2010, 08/16/2012  . Influenza,inj,Quad PF,6+ Mos 07/25/2013, 09/16/2015, 07/30/2016  . Influenza-Unspecified 09/26/2014, 11/17/2017  . PPD Test 12/29/2004  . Pneumococcal Polysaccharide-23 07/31/2002, 09/06/2007, 12/27/2012    Past Medical History:  Diagnosis Date  . Allergic rhinitis   . Allergy   . Anemia   . Anxiety   . Arthritis   . Asthma   . Chronic otitis media   . Coronary artery disease   . Depression   . Diastolic CHF, chronic (HCC)   . Dysphagia   . GERD (gastroesophageal reflux disease)   . History of cervical dysplasia   .  History of herpes zoster   . HTN (hypertension)   . Human immunodeficiency virus (HIV) (HCC) 2001  . Hyperlipidemia   . Obesity   . OSA (obstructive sleep apnea)   . Oxygen deficiency   . Pulmonary HTN (HCC)   . Sleep apnea   . Wrist fracture, bilateral     Tobacco History: Social History   Tobacco Use  Smoking Status Never Smoker  Smokeless Tobacco Never Used   Counseling given: Not Answered   Outpatient Medications Prior to Visit  Medication Sig Dispense Refill  . amLODipine (NORVASC) 10 MG tablet Take 1 tablet (10 mg total) by mouth daily. 30 tablet 11  . atorvastatin (LIPITOR) 20 MG tablet Take 1 tablet (20 mg total) by mouth daily. 90 tablet 3  . beclomethasone (BECONASE-AQ) 42 MCG/SPRAY nasal spray Place 1 spray into both nostrils 2 (two) times daily. Dose is for each nostril. (Patient taking differently: Place 1 spray into both nostrils 2 (two) times daily as needed for allergies or rhinitis. ) 25 g 12  . cetirizine (ZYRTEC) 10 MG tablet TAKE 1 TABLET BY MOUTH DAILY 90 tablet 3  . furosemide (LASIX) 20 MG tablet TAKE ONE TABLET BY MOUTH EVERY DAY. MAY TAKE EXTRA TABLET EVERY DAY AS NEEDED FOR WEIGHT GAIN 3 LBS OR MORE OVERNIGHT 60 tablet 0  . GENVOYA 150-150-200-10 MG TABS tablet TAKE 1 TABLET BY MOUTH EVERY DAY WITH BREAKFAST 30 tablet 0  .  montelukast (SINGULAIR) 10 MG tablet TAKE ONE TABLET BY MOUTH EVERY NIGHT AT BEDTIME.**APPOINTMENT NEEDED FOR FURTHER REFILLS** 30 tablet 0  . Multiple Vitamin (MULTI-VITAMINS) TABS TAKE 1 TABLET BY MOUTH DAILY 90 tablet 3  . naphazoline-pheniramine (NAPHCON-A) 0.025-0.3 % ophthalmic solution INSTILL 1 DROP INTO BOTH EYES FOUR TIMES A DAY AS NEEDED FOR IRRITATION 15 mL 0  . omeprazole (PRILOSEC) 40 MG capsule TAKE 1 CAPSULE BY MOUTH DAILY (Patient taking differently: Take 40 mg by mouth daily. ) 90 capsule 3  . OXYGEN Inhale 2 L into the lungs at bedtime.    Marland Kitchen. PROAIR HFA 108 (90 Base) MCG/ACT inhaler INHALE 1 OR 2 PUFFS INTO THE LUNGS  EVERY 6 HOURS AS NEEDED FOR WHEEZING OR SHORTNESS OF BREATH 8.5 g 0  . SYMBICORT 160-4.5 MCG/ACT inhaler INHALE 2 PUFFS INTO THE LUNGS TWO TIMES DAILY 10.2 g 0   No facility-administered medications prior to visit.    Review of Systems  Review of Systems  Constitutional: Negative.   HENT: Negative.   Respiratory: Negative for cough, shortness of breath and wheezing.    Physical Exam  BP (!) 126/94 (BP Location: Left Arm, Cuff Size: Normal)   Pulse 68   Temp 98.4 F (36.9 C)   Ht 5\' 3"  (1.6 m)   Wt 216 lb 12.8 oz (98.3 kg)   SpO2 94%   BMI 38.40 kg/m  Physical Exam Constitutional:      General: She is not in acute distress.    Appearance: She is well-developed. She is obese. She is not ill-appearing.  HENT:     Head: Normocephalic and atraumatic.     Right Ear: Tympanic membrane normal.     Left Ear: Tympanic membrane normal.     Mouth/Throat:     Mouth: Mucous membranes are moist.     Pharynx: Oropharynx is clear.  Eyes:     Pupils: Pupils are equal, round, and reactive to light.  Neck:     Musculoskeletal: Normal range of motion and neck supple.  Cardiovascular:     Rate and Rhythm: Normal rate and regular rhythm.     Heart sounds: Normal heart sounds. No murmur.  Pulmonary:     Effort: Pulmonary effort is normal. No respiratory distress.     Breath sounds: Normal breath sounds. No wheezing.  Musculoskeletal: Normal range of motion.  Skin:    General: Skin is warm and dry.     Findings: No erythema or rash.  Neurological:     General: No focal deficit present.     Mental Status: She is alert and oriented to person, place, and time. Mental status is at baseline.  Psychiatric:        Mood and Affect: Mood normal.        Behavior: Behavior normal.        Thought Content: Thought content normal.        Judgment: Judgment normal.      Lab Results:  CBC    Component Value Date/Time   WBC 5.9 04/12/2019 0936   RBC 4.45 04/12/2019 0936   HGB 12.8 04/12/2019  0936   HCT 39.0 04/12/2019 0936   PLT 311 04/12/2019 0936   MCV 87.6 04/12/2019 0936   MCH 28.8 04/12/2019 0936   MCHC 32.8 04/12/2019 0936   RDW 13.3 04/12/2019 0936   LYMPHSABS 2,791 04/12/2019 0936   MONOABS 517 03/30/2017 1555   EOSABS 207 04/12/2019 0936   BASOSABS 71 04/12/2019 0936    BMET  Component Value Date/Time   NA 138 04/12/2019 0936   K 4.7 04/12/2019 0936   CL 102 04/12/2019 0936   CO2 27 04/12/2019 0936   GLUCOSE 100 (H) 04/12/2019 0936   BUN 20 04/12/2019 0936   CREATININE 0.88 04/12/2019 0936   CALCIUM 10.2 04/12/2019 0936   GFRNONAA 76 04/12/2019 0936   GFRAA 88 04/12/2019 0936    BNP    Component Value Date/Time   BNP 6.9 06/25/2017 1135    ProBNP    Component Value Date/Time   PROBNP 19.4 11/24/2010 1550    Imaging: No results found.   Assessment & Plan:   Obstructive sleep apnea - 100% compliance and reports benefit from CPAP use - Pressure 13 cm H20; AHI 3.8 - No changes today  - Referral new CPAP machine  - FU in 1 year   Obesity hypoventilation syndrome (HCC) - Chronic respiratory failure d/t OHS - Continue 2L oxygen with CPAP - Encourage weight loss and staying active   Allergic rhinitis - Continue Symbicort twice daily and prn Albuterol for sob/wheezing  - Continue Singulair 10mg  at bedtime and prn zyrtec for allergy symptoms    Martyn Ehrich, NP 04/27/2019

## 2019-04-27 NOTE — Assessment & Plan Note (Signed)
-   100% compliance and reports benefit from CPAP use - Pressure 13 cm H20; AHI 3.8 - No changes today  - Referral new CPAP machine  - FU in 1 year

## 2019-04-27 NOTE — Patient Instructions (Signed)
Obstructive sleep apnea: - Continue to wear CPAP every night for 4-6 hours or more - Do not drive if experiencing excessive daytime fatigue or somnolence - Do not take sedating medication or drink alcohol in excess prior to bedtime as these can worsen your sleep apnea  Referral for new CPAP machine   Follow up in 1 year with Dr. Craige CottaSood or sooner if any issues come up    Living With Sleep Apnea Sleep apnea is a condition in which breathing pauses or becomes shallow during sleep. Sleep apnea is most commonly caused by a collapsed or blocked airway. People with sleep apnea snore loudly and have times when they gasp and stop breathing for 10 seconds or more during sleep. This happens over and over during the night. This disrupts your sleep and keeps your body from getting the rest that it needs, which can cause tiredness and lack of energy (fatigue) during the day. The breaks in breathing also interrupt the deep sleep that you need to feel rested. Even if you do not completely wake up from the gaps in breathing, your sleep may not be restful. You may also have a headache in the morning and low energy during the day, and you may feel anxious or depressed. How can sleep apnea affect me? Sleep apnea increases your chances of extreme tiredness during the day (daytime fatigue). It can also increase your risk for health conditions, such as:  Heart attack.  Stroke.  Diabetes.  Heart failure.  Irregular heartbeat.  High blood pressure. If you have daytime fatigue as a result of sleep apnea, you may be more likely to:  Perform poorly at school or work.  Fall asleep while driving.  Have difficulty with attention.  Develop depression or anxiety.  Become severely overweight (obese).  Have sexual dysfunction. What actions can I take to manage sleep apnea? Sleep apnea treatment   If you were given a device to open your airway while you sleep, use it only as told by your health care  provider. You may be given: ? An oral appliance. This is a custom-made mouthpiece that shifts your lower jaw forward. ? A continuous positive airway pressure (CPAP) device. This device blows air through a mask when you breathe out (exhale). ? A nasal expiratory positive airway pressure (EPAP) device. This device has valves that you put into each nostril. ? A bi-level positive airway pressure (BPAP) device. This device blows air through a mask when you breathe in (inhale) and breathe out (exhale).  You may need surgery if other treatments do not work for you. Sleep habits  Go to sleep and wake up at the same time every day. This helps set your internal clock (circadian rhythm) for sleeping. ? If you stay up later than usual, such as on weekends, try to get up in the morning within 2 hours of your normal wake time.  Try to get at least 7-9 hours of sleep each night.  Stop computer, tablet, and mobile phone use a few hours before bedtime.  Do not take long naps during the day. If you nap, limit it to 30 minutes.  Have a relaxing bedtime routine. Reading or listening to music may relax you and help you sleep.  Use your bedroom only for sleep. ? Keep your television and computer out of your bedroom. ? Keep your bedroom cool, dark, and quiet. ? Use a supportive mattress and pillows.  Follow your health care provider's instructions for other changes to sleep  habits. Nutrition  Do not eat heavy meals in the evening.  Do not have caffeine in the later part of the day. The effects of caffeine can last for more than 5 hours.  Follow your health care provider's or dietitian's instructions for any diet changes. Lifestyle      Do not drink alcohol before bedtime. Alcohol can cause you to fall asleep at first, but then it can cause you to wake up in the middle of the night and have trouble getting back to sleep.  Do not use any products that contain nicotine or tobacco, such as cigarettes  and e-cigarettes. If you need help quitting, ask your health care provider. Medicines  Take over-the-counter and prescription medicines only as told by your health care provider.  Do not use over-the-counter sleep medicine. You can become dependent on this medicine, and it can make sleep apnea worse.  Do not use medicines, such as sedatives and narcotics, unless told by your health care provider. Activity  Exercise on most days, but avoid exercising in the evening. Exercising near bedtime can interfere with sleeping.  If possible, spend time outside every day. Natural light helps regulate your circadian rhythm. General information  Lose weight if you need to, and maintain a healthy weight.  Keep all follow-up visits as told by your health care provider. This is important.  If you are having surgery, make sure to tell your health care provider that you have sleep apnea. You may need to bring your device with you. Where to find more information Learn more about sleep apnea and daytime fatigue from:  American Sleep Association: sleepassociation.[place]: sleepfoundation.org  National Heart, Lung, and Blood Institute: https://www.[place].biz/ Summary  Sleep apnea can cause daytime fatigue and other serious health conditions.  Both sleep apnea and daytime fatigue can be bad for your health and well-being.  You may need to wear a device while sleeping to help keep your airway open.  If you are having surgery, make sure to tell your health care provider that you have sleep apnea. You may need to bring your device with you.  Making changes to sleep habits, diet, lifestyle, and activity can help you manage sleep apnea. This information is not intended to replace advice given to you by your health care provider. Make sure you discuss any questions you have with your health care provider. Document Released: 01/27/2018 Document Revised: 07/05/2018 Document Reviewed: 01/27/2018  Elsevier Interactive Patient Education  [employer].

## 2019-04-27 NOTE — Assessment & Plan Note (Addendum)
-   Continue Symbicort twice daily and prn Albuterol for sob/wheezing  - Continue Singulair 10mg  at bedtime and prn zyrtec for allergy symptoms

## 2019-04-28 ENCOUNTER — Telehealth: Payer: Self-pay | Admitting: Internal Medicine

## 2019-04-28 NOTE — Telephone Encounter (Signed)
COVID-19 Pre-Screening Questions:04/28/19  Do you currently have a fever (>100 F), chills or unexplained body aches? NO   Are you currently experiencing new cough, shortness of breath, sore throat, runny nose? NO   Have you recently travelled outside the state of [place] in the last 14 days? NO   Have you been in contact with someone that is currently pending confirmation of Covid19 testing or has been confirmed to have the [place] virus?  NO  **If the patient answers NO to ALL questions -  advise the patient to please call the clinic before coming to the office should any symptoms develop.

## 2019-05-01 ENCOUNTER — Other Ambulatory Visit: Payer: Self-pay

## 2019-05-01 ENCOUNTER — Ambulatory Visit (INDEPENDENT_AMBULATORY_CARE_PROVIDER_SITE_OTHER): Payer: Medicaid Other | Admitting: Internal Medicine

## 2019-05-01 ENCOUNTER — Encounter: Payer: Self-pay | Admitting: Internal Medicine

## 2019-05-01 VITALS — BP 159/89 | HR 67 | Temp 98.6°F | Wt 220.0 lb

## 2019-05-01 DIAGNOSIS — Z0000 Encounter for general adult medical examination without abnormal findings: Secondary | ICD-10-CM | POA: Diagnosis not present

## 2019-05-01 DIAGNOSIS — Z23 Encounter for immunization: Secondary | ICD-10-CM

## 2019-05-01 MED ORDER — HYDROCORTISONE 1 % EX OINT: 1.0000 "application " | TOPICAL_OINTMENT | Freq: Two times a day (BID) | CUTANEOUS | 0 refills | Status: DC

## 2019-05-01 NOTE — Progress Notes (Signed)
RFV: follow up for hiv disease Patient ID: [name], female   DOB: [birth year], 53 y.o.   MRN: [id number]  HPI Leslie Gates is a53yo f with well controlled hiv disease, currently on genvoya.doing well with adherence. No sick visits. Doesn't know anyone with covid 19.  Outpatient Encounter Medications as of 05/01/2019  Medication Sig  . amLODipine (NORVASC) 10 MG tablet Take 1 tablet (10 mg total) by mouth daily.  Marland Kitchen atorvastatin (LIPITOR) 20 MG tablet Take 1 tablet (20 mg total) by mouth daily.  . beclomethasone (BECONASE-AQ) 42 MCG/SPRAY nasal spray Place 1 spray into both nostrils 2 (two) times daily. Dose is for each nostril. (Patient taking differently: Place 1 spray into both nostrils 2 (two) times daily as needed for allergies or rhinitis. )  . cetirizine (ZYRTEC) 10 MG tablet TAKE 1 TABLET BY MOUTH DAILY  . furosemide (LASIX) 20 MG tablet TAKE ONE TABLET BY MOUTH EVERY DAY. MAY TAKE EXTRA TABLET EVERY DAY AS NEEDED FOR WEIGHT GAIN 3 LBS OR MORE OVERNIGHT  . GENVOYA 150-150-200-10 MG TABS tablet TAKE 1 TABLET BY MOUTH EVERY DAY WITH BREAKFAST  . montelukast (SINGULAIR) 10 MG tablet TAKE ONE TABLET BY MOUTH EVERY NIGHT AT BEDTIME.**APPOINTMENT NEEDED FOR FURTHER REFILLS**  . Multiple Vitamin (MULTI-VITAMINS) TABS TAKE 1 TABLET BY MOUTH DAILY  . naphazoline-pheniramine (NAPHCON-A) 0.025-0.3 % ophthalmic solution INSTILL 1 DROP INTO BOTH EYES FOUR TIMES A DAY AS NEEDED FOR IRRITATION  . omeprazole (PRILOSEC) 40 MG capsule TAKE 1 CAPSULE BY MOUTH DAILY (Patient taking differently: Take 40 mg by mouth daily. )  . OXYGEN Inhale 2 L into the lungs at bedtime.  Marland Kitchen PROAIR HFA 108 (90 Base) MCG/ACT inhaler INHALE 1 OR 2 PUFFS INTO THE LUNGS EVERY 6 HOURS AS NEEDED FOR WHEEZING OR SHORTNESS OF BREATH  . SYMBICORT 160-4.5 MCG/ACT inhaler INHALE 2 PUFFS INTO THE LUNGS TWO TIMES DAILY   No facility-administered encounter medications on file as of 05/01/2019.      Patient Active Problem List   Diagnosis Date Noted  . Special screening for malignant neoplasms, colon 07/22/2018  . O2 dependent 07/22/2018  . Hyperlipidemia 04/29/2018  . Chronic diastolic heart failure ([place]) 06/25/2017  . Sensorineural hearing loss (SNHL), bilateral 02/24/2017  . Tinnitus, bilateral 02/24/2017  . Prediabetes 04/16/2016  . Moderate dysplasia of cervix 07/10/2013  . History of angioedema 09/09/2011  . Obesity hypoventilation syndrome ([place]) 07/10/2010  . Morbid obesity ([place]) 07/01/2010  . Obstructive sleep apnea 06/27/2010  . Pulmonary hypertension ([place]) 05/28/2010  . Allergic rhinitis 03/14/2008  . Chronic otitis media 03/22/2007  . Primary hypertension 03/22/2007  . HIV disease ([place]) 06/21/2002     Health Maintenance Due  Topic Date Due  . FOOT EXAM  07/05/1977  . OPHTHALMOLOGY EXAM  07/05/1977  . URINE MICROALBUMIN  07/05/1977  . TETANUS/TDAP  07/05/1986  . COLONOSCOPY  07/05/2017  . HEMOGLOBIN A1C  10/29/2018  . PAP SMEAR-Modifier  12/30/2018     Review of Systems Review of Systems  Constitutional: Negative for fever, chills, diaphoresis, activity change, appetite change, fatigue and unexpected weight change.  HENT: Negative for congestion, sore throat, rhinorrhea, sneezing, trouble swallowing and sinus pressure.  Eyes: Negative for photophobia and visual disturbance.  Respiratory: Negative for cough, chest tightness, shortness of breath, wheezing and stridor.  Cardiovascular: Negative for chest pain, palpitations and leg swelling.  Gastrointestinal: Negative for nausea, vomiting, abdominal pain, diarrhea, constipation, blood in stool, abdominal distention and anal bleeding.  Genitourinary: Negative for dysuria, hematuria, flank pain  and difficulty urinating.  Musculoskeletal: Negative for myalgias, back pain, joint swelling, arthralgias and gait problem.  Skin: Negative for color change, pallor, rash and wound.  Neurological: Negative for dizziness, tremors, weakness and  light-headedness.  Hematological: Negative for adenopathy. Does not bruise/bleed easily.  Psychiatric/Behavioral: Negative for behavioral problems, confusion, sleep disturbance, dysphoric mood, decreased concentration and agitation.    Physical Exam   Wt 220 lb (99.8 kg)   LMP 03/22/2019   BMI 38.97 kg/m   Physical Exam  Constitutional:  oriented to person, place, and time. appears well-developed and well-nourished. No distress.  HENT: [place]/AT, PERRLA, no scleral icterus Mouth/Throat: Oropharynx is clear and moist. No oropharyngeal exudate.  Cardiovascular: Normal rate, regular rhythm and normal heart sounds. Exam reveals no gallop and no friction rub.  No murmur heard.  Pulmonary/Chest: Effort normal and breath sounds normal. No respiratory distress.  has no wheezes.  Neck = supple, no nuchal rigidity Abdominal: Soft. Bowel sounds are normal.  exhibits no distension. There is no tenderness.  Lymphadenopathy: no cervical adenopathy. No axillary adenopathy Neurological: alert and oriented to person, place, and time.  Skin: Skin is warm and dry. No rash noted. No erythema.  Psychiatric: a normal mood and affect.  behavior is normal.   Lab Results  Component Value Date   CD4TCELL 29 (L) 04/12/2019   Lab Results  Component Value Date   CD4TABS 739 04/12/2019   CD4TABS 560 10/06/2018   CD4TABS 580 04/28/2018   Lab Results  Component Value Date   HIV1RNAQUANT <20 DETECTED (A) 04/12/2019   Lab Results  Component Value Date   HEPBSAB NEG 02/08/2009   Lab Results  Component Value Date   LABRPR NON-REACTIVE 10/06/2018    CBC Lab Results  Component Value Date   WBC 5.9 04/12/2019   RBC 4.45 04/12/2019   HGB 12.8 04/12/2019   HCT 39.0 04/12/2019   PLT 311 04/12/2019   MCV 87.6 04/12/2019   MCH 28.8 04/12/2019   MCHC 32.8 04/12/2019   RDW 13.3 04/12/2019   LYMPHSABS 2,791 04/12/2019   MONOABS 517 03/30/2017   EOSABS 207 04/12/2019    BMET Lab Results  Component  Value Date   NA 138 04/12/2019   K 4.7 04/12/2019   CL 102 04/12/2019   CO2 27 04/12/2019   GLUCOSE 100 (H) 04/12/2019   BUN 20 04/12/2019   CREATININE 0.88 04/12/2019   CALCIUM 10.2 04/12/2019   GFRNONAA 76 04/12/2019   GFRAA 88 04/12/2019     Assessment and Plan  hiv disease = well controlled hiv disease. We will plan to continue genvoya  Long term medication management = cr is stable. Continue with current ART  Hyperglycemia = will check Hemoglobin a1c  Health maintenance = will give prevnar 13 today

## 2019-05-02 LAB — HEMOGLOBIN A1C
Hgb A1c MFr Bld: 6.6 % of total Hgb — ABNORMAL HIGH (ref <5.7)
Mean Plasma Glucose: 143 (calc)
eAG (mmol/L): 7.9 (calc)

## 2019-05-09 ENCOUNTER — Other Ambulatory Visit: Payer: Self-pay | Admitting: Family Medicine

## 2019-05-09 DIAGNOSIS — I5032 Chronic diastolic (congestive) heart failure: Secondary | ICD-10-CM

## 2019-05-16 ENCOUNTER — Other Ambulatory Visit: Payer: Self-pay | Admitting: Internal Medicine

## 2019-05-17 DIAGNOSIS — B20 Human immunodeficiency virus [HIV] disease: Secondary | ICD-10-CM | POA: Diagnosis not present

## 2019-05-17 DIAGNOSIS — R0902 Hypoxemia: Secondary | ICD-10-CM | POA: Diagnosis not present

## 2019-05-17 DIAGNOSIS — G4733 Obstructive sleep apnea (adult) (pediatric): Secondary | ICD-10-CM | POA: Diagnosis not present

## 2019-05-17 DIAGNOSIS — R062 Wheezing: Secondary | ICD-10-CM | POA: Diagnosis not present

## 2019-05-22 ENCOUNTER — Telehealth: Payer: Self-pay | Admitting: Pulmonary Disease

## 2019-05-22 NOTE — Telephone Encounter (Signed)
Called & spoke w/ pt regarding her concerns for her CPAP machine. Pt states she has not received her replacement CPAP machine from [facility] yet. She states she called [facility] (under [facility]) and they informed her there was no order placed, however, according to our records, an order for a replacement CPAP was placed at pt's LOV 04/27/2019 and confirmed by Sonia Baller from [facility].   Routing this message to the [facility] pool for f/u. PCCs, any updates on this patient's CPAP order? Thank you.

## 2019-05-23 NOTE — Telephone Encounter (Signed)
Calling [place] at [facility] to check on order.

## 2019-05-23 NOTE — Telephone Encounter (Signed)
Spoke to [place].  She states they processed the supplies.  Request for CPAP was within wording in the order and she doesn't think Respiratory Dept processed it.  She is going to give this to them today to get processed and they will contact pt today or tomorrow.  I called pt & explained to her they should be contacting her today or tomorrow.  Nothing further needed.

## 2019-06-05 ENCOUNTER — Telehealth: Payer: Self-pay | Admitting: Pulmonary Disease

## 2019-06-05 NOTE — Telephone Encounter (Signed)
Pt would like to know the status of her CPAP machine. Referral order was placed 04/27/2019 to [facility]. A confirmation was received from [facility].   PCCs, please advise. Let us know if the order needs to be placed again. Thank you.

## 2019-06-06 ENCOUNTER — Other Ambulatory Visit: Payer: Self-pay | Admitting: *Deleted

## 2019-06-06 DIAGNOSIS — I5032 Chronic diastolic (congestive) heart failure: Secondary | ICD-10-CM

## 2019-06-06 MED ORDER — FUROSEMIDE 20 MG PO TABS: ORAL_TABLET | ORAL | 0 refills | Status: DC

## 2019-06-06 MED ORDER — CETIRIZINE HCL 10 MG PO TABS: 10.0000 mg | ORAL_TABLET | Freq: Every day | ORAL | 2 refills | Status: DC

## 2019-06-06 MED ORDER — OMEPRAZOLE 40 MG PO CPDR: 40.0000 mg | DELAYED_RELEASE_CAPSULE | Freq: Every day | ORAL | 3 refills | Status: DC

## 2019-06-06 MED ORDER — TAB-A-VITE PO TABS: 1.0000 | ORAL_TABLET | Freq: Every day | ORAL | 2 refills | Status: DC

## 2019-06-06 NOTE — Telephone Encounter (Signed)
Refills provided. Please have patient schedule an annual exam at earliest convenience sometime before November. Thank you

## 2019-06-07 DIAGNOSIS — G4733 Obstructive sleep apnea (adult) (pediatric): Secondary | ICD-10-CM | POA: Diagnosis not present

## 2019-06-07 NOTE — Telephone Encounter (Signed)
LVM to advise [facility] stated the order was still in process and they will contact her once completed. Requested call back to confirm the message was received.  Otherwise, nothing further was needed at this time.

## 2019-06-07 NOTE — Telephone Encounter (Signed)
Spoke with Olin Hauser at [facility] and she states they have the order but it is still in process. She stated they would reach out to the pt when everything is complete to discuss pt responsibility. I called pt to let her know but there was no answer. I left message to call back.

## 2019-06-07 NOTE — Telephone Encounter (Signed)
Pt is calling back [phone]

## 2019-06-08 NOTE — Telephone Encounter (Addendum)
Spoke with pt, advised her that the order was at [facility] and we are waiting for them to finishing processing their order. She states she was told that they didn't have the order. I called [facility] with pt on the phone through a conference call and they stated they did have the order. I requested that the CSR find out the status of the order. He stated the CPAP machine was sent out and [employer] is showing that it should arrive at pt's house by Saturday July 25,. Pt understood and nothing further is needed.

## 2019-06-14 ENCOUNTER — Other Ambulatory Visit: Payer: Self-pay | Admitting: *Deleted

## 2019-06-14 DIAGNOSIS — B20 Human immunodeficiency virus [HIV] disease: Secondary | ICD-10-CM

## 2019-06-14 MED ORDER — GENVOYA 150-150-200-10 MG PO TABS: ORAL_TABLET | ORAL | 5 refills | Status: DC

## 2019-06-15 ENCOUNTER — Other Ambulatory Visit: Payer: Self-pay

## 2019-06-15 DIAGNOSIS — E782 Mixed hyperlipidemia: Secondary | ICD-10-CM

## 2019-06-15 MED ORDER — ATORVASTATIN CALCIUM 20 MG PO TABS: 20.0000 mg | ORAL_TABLET | Freq: Every day | ORAL | 3 refills | Status: DC

## 2019-06-17 DIAGNOSIS — G4733 Obstructive sleep apnea (adult) (pediatric): Secondary | ICD-10-CM | POA: Diagnosis not present

## 2019-06-17 DIAGNOSIS — R0902 Hypoxemia: Secondary | ICD-10-CM | POA: Diagnosis not present

## 2019-06-17 DIAGNOSIS — R062 Wheezing: Secondary | ICD-10-CM | POA: Diagnosis not present

## 2019-06-17 DIAGNOSIS — B20 Human immunodeficiency virus [HIV] disease: Secondary | ICD-10-CM | POA: Diagnosis not present

## 2019-06-29 ENCOUNTER — Other Ambulatory Visit: Payer: Self-pay

## 2019-06-29 ENCOUNTER — Encounter: Payer: Self-pay | Admitting: Family Medicine

## 2019-06-29 ENCOUNTER — Ambulatory Visit (INDEPENDENT_AMBULATORY_CARE_PROVIDER_SITE_OTHER): Payer: Medicaid Other | Admitting: Family Medicine

## 2019-06-29 VITALS — BP 160/100 | HR 67 | Wt 219.8 lb

## 2019-06-29 DIAGNOSIS — I10 Essential (primary) hypertension: Secondary | ICD-10-CM | POA: Diagnosis not present

## 2019-06-29 DIAGNOSIS — R7303 Prediabetes: Secondary | ICD-10-CM

## 2019-06-29 DIAGNOSIS — Z1211 Encounter for screening for malignant neoplasm of colon: Secondary | ICD-10-CM

## 2019-06-29 DIAGNOSIS — E782 Mixed hyperlipidemia: Secondary | ICD-10-CM | POA: Diagnosis not present

## 2019-06-29 DIAGNOSIS — Z0000 Encounter for general adult medical examination without abnormal findings: Secondary | ICD-10-CM | POA: Diagnosis not present

## 2019-06-29 DIAGNOSIS — I5032 Chronic diastolic (congestive) heart failure: Secondary | ICD-10-CM | POA: Diagnosis not present

## 2019-06-29 MED ORDER — INDAPAMIDE 1.25 MG PO TABS: 1.2500 mg | ORAL_TABLET | Freq: Every day | ORAL | 3 refills | Status: DC

## 2019-06-29 MED ORDER — SPIRONOLACTONE 25 MG PO TABS: 12.5000 mg | ORAL_TABLET | Freq: Every day | ORAL | 3 refills | Status: DC

## 2019-06-29 MED ORDER — METFORMIN HCL ER 500 MG PO TB24: 500.0000 mg | ORAL_TABLET | Freq: Every day | ORAL | 3 refills | Status: DC

## 2019-06-29 NOTE — Patient Instructions (Addendum)
Thank you so much for coming to see me today!   Today we started two new medications for your blood prsesure and diabetes.  Blood pressure: Indapamide 1.25mg  - take 1 pill at night before bed. Please periodically check your blood pressures and keep a log. Please call me if you begin to feel light headed or have any side effects.  Diabetes: Metformin 500mg : take 1 pill with breakfast.  Please continue to work on your eating choices and walking once a day. These will do great things to help improve your blood sugars!  Please come back to see me in 1 month for blood pressure and diabetes recheck. Sooner if you need me.   Take care, Dr. Tarry Kos

## 2019-06-29 NOTE — Progress Notes (Addendum)
Subjective:   Patient ID: Leslie Gates    DOB: October 18, 1966, 53 y.o. female   MRN: [id number]  Leslie Gates is a 53 y.o. female with a history of diastolic HF, HTN, OHS/OSA, HIV, prediabetes  here for annual physical.  HTN: BP elevated to 160/90. Repeat 160/100. Currently on Amlodipine 10mg  QD. Endorses compliance. Has had allergic reaction to Lisinopril in the past leading to angioedema. Denies chest pain, SOB, headaches or vision changes.   HLD: Currently taking Lipitor 20mg  QD. Endorses compliance. Denies any muscle pains. Last lipid panel in 2019.  Prediabetes: Last A1C 6.6, up from 6.4. Currently not on glycemic lowering agents. Denies any polyuria, polydipsia, polyphagia. Patient is interested in life style modifications such as changes in diet. She notes she was walking 1-2 miles but recently quit. She is interested in starting a medicine but is very focused and modivated on life style modifications.  Diastolic HF: Last EF 40-98% with normal wall motion in June 2019. Currently taking Lasix 20mg  QD. Currently sleeps with 3 pills, not able to sleep flat. Denies any SOB, orthopnea, or LE swelling.    Health Maintenance: Due for TDAP, colonoscopy, papsmear. Interested in [place]: Patient reports no vision/ hearing changes, anorexia, weight change, fever, persistant / recurrent hoarseness, swallowing issues, chest pain, edema ,persistant / recurrent cough, hemoptysis, dyspnea, gastrointestinal  bleeding (melena, rectal bleeding), abdominal pain, excessive heart burn, GU symptoms (dysuria, hematuria, pyuria, voiding/incontinence  Issues), syncope, focal weakness, severe memory loss, concerning skin lesions, depression, anxiety, abnormal bruising/bleeding, major joint swelling, breast masses or abnormal vaginal bleeding.    Declines tobacco use, vaping, alcohol, illicit drugs.   Review of Systems:  Per HPI.   [place], medications and smoking status reviewed.  Objective:    BP (!) 160/100   Pulse 67   Wt 219 lb 12.8 oz (99.7 kg)   SpO2 90%   BMI 38.94 kg/m  Vitals and nursing note reviewed.  General: well nourished, well developed, in no acute distress with non-toxic appearance, sitting comfortably in exam chair HEENT: normocephalic, atraumatic, moist mucous membranes, oropharynx without erythema or exudate, grade 3 tonsillar hypertrophy, left ear difficult to visualize due to cerumen, right ear with normal TM Neck: supple, non-tender without lymphadenopathy CV: regular rate and rhythm without murmurs, rubs, or gallops, no lower extremity edema Lungs: clear to auscultation bilaterally with normal work of breathing Abdomen: soft, non-tender, non-distended, normoactive bowel sounds Skin: warm, dry Extremities: warm and well perfused Neuro: Alert and oriented, speech normal  Assessment & Plan:   Primary hypertension BP elevated after multiple repeat readings. Patient is asymptomatic. History of angioedema on Lisinopril, thus do not recommend ARB. Currently on Lasix, thus recommend against Thiazide. - Continue Amlodipine 10mg  QD - Start Spironolactone 12.5mg  qHS - Follow up in 1 month for BP check, BMP to evaluate kidney function - Patient instructed to check blood pressures periodically and call if continues to remain elevated or develops any low blood pressures <90/60. Patient understood and agreed to plan.  Chronic diastolic heart failure (HCC) Chronic and stable on Lasix 20mg  QD. No signs of volume overload on exam. Endorses compliance. - continue Lasix 20mg  QD - continue antihypertensives as above   Hyperlipidemia Currently on Lipitor 20mg  QD. Tolerating well without any adverse reactions. - Continue current meds - obtain lipid panel at follow up visit  Prediabetes A1C elevated to 6.6 today, up from 6.4 last year. Greater than 50% of the visit focused on discussion of lifestyle modifications. She  is open to starting medicine but patient endorses  great motivation to lose weight, start exercising, and improving diet.  - start Metformin ER 500mg  QD - life style modifications - RTC in 1 month, recheck A1C at that time    Health Maintenance: - patient interested in Cologuard - packet provided and instructions given today - Declines TDAP - Plans to get Papsmear with OBGYN in September 2020  Orders Placed This Encounter  Procedures  . Cologuard   Meds ordered this encounter  Medications  . metFORMIN (GLUCOPHAGE-XR) 500 MG 24 hr tablet    Sig: Take 1 tablet (500 mg total) by mouth daily with breakfast.    Dispense:  90 tablet    Refill:  3  . spironolactone (ALDACTONE) 25 MG tablet    Sig: Take 0.5 tablets (12.5 mg total) by mouth at bedtime.    Dispense:  90 tablet    Refill:  3    Orpah CobbKiersten Mullis, DO PGY-2, [facility] 06/29/2019 7:18 PM

## 2019-06-29 NOTE — Assessment & Plan Note (Signed)
A1C elevated to 6.6 today, up from 6.4 last year. Greater than 50% of the visit focused on discussion of lifestyle modifications. She is open to starting medicine but patient endorses great motivation to lose weight, start exercising, and improving diet.  - start Metformin ER 500mg  QD - life style modifications - RTC in 1 month, recheck A1C at that time

## 2019-06-29 NOTE — Assessment & Plan Note (Addendum)
BP elevated after multiple repeat readings. Patient is asymptomatic. History of angioedema on Lisinopril, thus do not recommend ARB. Currently on Lasix, thus recommend against Thiazide. - Continue Amlodipine 10mg  QD - Start Spironolactone 12.5mg  qHS - Follow up in 1 month for BP check, BMP to evaluate kidney function - Patient instructed to check blood pressures periodically and call if continues to remain elevated or develops any low blood pressures <90/60. Patient understood and agreed to plan.

## 2019-06-29 NOTE — Addendum Note (Signed)
Addended by: Danna Hefty on: 06/29/2019 07:19 PM   Modules accepted: Orders

## 2019-06-29 NOTE — Assessment & Plan Note (Signed)
Chronic and stable on Lasix 20mg  QD. No signs of volume overload on exam. Endorses compliance. - continue Lasix 20mg  QD - continue antihypertensives as above

## 2019-06-29 NOTE — Assessment & Plan Note (Addendum)
Currently on Lipitor 20mg  QD. Tolerating well without any adverse reactions. - Continue current meds - obtain lipid panel at follow up visit

## 2019-07-05 ENCOUNTER — Other Ambulatory Visit: Payer: Self-pay | Admitting: Family Medicine

## 2019-07-05 DIAGNOSIS — I5032 Chronic diastolic (congestive) heart failure: Secondary | ICD-10-CM

## 2019-07-05 NOTE — Progress Notes (Signed)
Reviewed and agree with assessment/plan.   Alyssamae Klinck, MD [facility] 11/11/2016, 12:24 PM Pager:  [phone]  

## 2019-07-18 DIAGNOSIS — R062 Wheezing: Secondary | ICD-10-CM | POA: Diagnosis not present

## 2019-07-18 DIAGNOSIS — G4733 Obstructive sleep apnea (adult) (pediatric): Secondary | ICD-10-CM | POA: Diagnosis not present

## 2019-07-18 DIAGNOSIS — B20 Human immunodeficiency virus [HIV] disease: Secondary | ICD-10-CM | POA: Diagnosis not present

## 2019-07-18 DIAGNOSIS — R0902 Hypoxemia: Secondary | ICD-10-CM | POA: Diagnosis not present

## 2019-07-19 ENCOUNTER — Other Ambulatory Visit: Payer: Self-pay

## 2019-07-19 MED ORDER — NAPHCON-A 0.025-0.3 % OP SOLN: OPHTHALMIC | 0 refills | Status: DC

## 2019-07-25 ENCOUNTER — Other Ambulatory Visit: Payer: Self-pay

## 2019-07-25 ENCOUNTER — Encounter: Payer: Self-pay | Admitting: Obstetrics and Gynecology

## 2019-07-25 ENCOUNTER — Ambulatory Visit: Payer: Medicaid Other | Admitting: Obstetrics and Gynecology

## 2019-07-25 ENCOUNTER — Other Ambulatory Visit (HOSPITAL_COMMUNITY)
Admission: RE | Admit: 2019-07-25 | Discharge: 2019-07-25 | Disposition: A | Discharge status: Home / Self Care | Payer: Medicaid Other | Source: Ambulatory Visit | Attending: Obstetrics and Gynecology | Admitting: Obstetrics and Gynecology

## 2019-07-25 VITALS — BP 133/82 | HR 75 | Temp 98.5°F | Ht 63.0 in | Wt 219.4 lb

## 2019-07-25 DIAGNOSIS — Z01419 Encounter for gynecological examination (general) (routine) without abnormal findings: Secondary | ICD-10-CM | POA: Insufficient documentation

## 2019-07-25 DIAGNOSIS — Z0000 Encounter for general adult medical examination without abnormal findings: Secondary | ICD-10-CM

## 2019-07-25 NOTE — Progress Notes (Signed)
Subjective:     Leslie Gates is a 53 y.o. female P2 with LMP 06/21/19 and BMI 38 who is here for a comprehensive physical exam. The patient reports no problems. She is sexually active using condoms and BTL for contraception. She denies pelvic pain or abnormal discharge. She denies urinary incontinence. Patient reports a monthly period lasting 5 days. She occasionally skips a period. Patient is without complaints  Past Medical History:  Diagnosis Date  . Allergic rhinitis   . Allergy   . Anemia   . Anxiety   . Arthritis   . Asthma   . Chronic otitis media   . Coronary artery disease   . Depression   . Diastolic CHF, chronic ([place])   . Dysphagia   . GERD (gastroesophageal reflux disease)   . History of cervical dysplasia   . History of herpes zoster   . HTN (hypertension)   . Human immunodeficiency virus (HIV) ([place]) 2001  . Hyperlipidemia   . Obesity   . OSA (obstructive sleep apnea)   . Oxygen deficiency   . Pulmonary HTN ([place])   . Sleep apnea   . Wrist fracture, bilateral    Past Surgical History:  Procedure Laterality Date  . ADENOIDECTOMY    . CESAREAN SECTION    . NASAL SINUS SURGERY  2004   [place]  . RIGHT HEART CATHETERIZATION N/A 02/01/2012   Procedure: RIGHT HEART CATH;  Surgeon: Jolaine Artist, MD;  Location: [facility] CATH LAB;  Service: Cardiovascular;  Laterality: N/A;  . TYMPANOSTOMY TUBE PLACEMENT     as a child  . WRIST FRACTURE SURGERY  2006   bilateral - [place]   Family History  Problem Relation Age of Onset  . Other Father        GSW  . Hypertension Father   . Kidney disease Father        was on dialysis prior to death  . Alcohol abuse Father        liver failure from alcohol use  . Other Mother 45       ESRD  . Hyperlipidemia Mother   . Hypertension Mother   . Alcohol abuse Mother   . Hypertension Unknown   . Hypertension Sister   . Alcohol abuse Sister   . Hypertension Brother   . Alcohol abuse Brother      Social History    Socioeconomic History  . Marital status: Single    Spouse name: Not on file  . Number of children: 2  . Years of education: Not on file  . Highest education level: Not on file  Occupational History    Comment: unemployed  Social Needs  . Financial resource strain: Not on file  . Food insecurity    Worry: Not on file    Inability: Not on file  . Transportation needs    Medical: Not on file    Non-medical: Not on file  Tobacco Use  . Smoking status: Never Smoker  . Smokeless tobacco: Never Used  Substance and Sexual Activity  . Alcohol use: No    Alcohol/week: 0.0 standard drinks  . Drug use: No  . Sexual activity: Yes    Birth control/protection: Surgical    Comment: declined condoms- last encounter 4 days ago  Lifestyle  . Physical activity    Days per week: Not on file    Minutes per session: Not on file  . Stress: Not on file  Relationships  . Social connections  Talks on phone: Not on file    Gets together: Not on file    Attends religious service: Not on file    Active member of club or organization: Not on file    Attends meetings of clubs or organizations: Not on file    Relationship status: Not on file  . Intimate partner violence    Fear of current or ex partner: Not on file    Emotionally abused: Not on file    Physically abused: Not on file    Forced sexual activity: Not on file  Other Topics Concern  . Not on file  Social History Narrative  . Not on file   Health Maintenance  Topic Date Due  . Fecal DNA (Cologuard)  07/05/2017  . PAP SMEAR-Modifier  12/30/2018  . MAMMOGRAM  01/05/2019  . INFLUENZA VACCINE  06/17/2019  . OPHTHALMOLOGY EXAM  07/30/2019 (Originally 07/05/1977)  . URINE MICROALBUMIN  07/30/2019 (Originally 07/05/1977)  . FOOT EXAM  06/28/2020 (Originally 07/05/1977)  . TETANUS/TDAP  06/28/2020 (Originally 07/05/1986)  . HEMOGLOBIN A1C  10/31/2019  . PNEUMOCOCCAL POLYSACCHARIDE VACCINE AGE 52-64 HIGH RISK  Completed  . HIV Screening   Completed       Review of Systems Pertinent items are noted in HPI.   Objective:  Blood pressure 133/82, pulse 75, temperature 98.5 F (36.9 C), height 5\' 3"  (1.6 m), weight 219 lb 6.4 oz (99.5 kg), last menstrual period 06/21/2019.     GENERAL: Well-developed, well-nourished female in no acute distress.  HEENT: Normocephalic, atraumatic. Sclerae anicteric.  NECK: Supple. Normal thyroid.  LUNGS: Clear to auscultation bilaterally.  HEART: Regular rate and rhythm. BREASTS: Symmetric in size. No palpable masses or lymphadenopathy, skin changes, or nipple drainage. ABDOMEN: Soft, nontender, nondistended. No organomegaly. PELVIC: Normal external female genitalia. Vagina is pink and rugated.  Normal discharge. Normal appearing cervix. Uterus is normal in size. No adnexal mass or tenderness. EXTREMITIES: No cyanosis, clubbing, or edema, 2+ distal pulses.    Assessment:    Healthy female exam.      Plan:    Pap smear collected Screening mammogram ordered Colonoscopy ordered by PCP Patient will be contacted with abnormal results Follow up with PCP See After Visit Summary for Counseling Recommendations

## 2019-07-26 LAB — HEPATITIS B SURFACE ANTIGEN: Hepatitis B Surface Ag: NEGATIVE

## 2019-07-26 LAB — RPR: RPR Ser Ql: NONREACTIVE

## 2019-07-26 LAB — HEPATITIS C ANTIBODY: Hep C Virus Ab: <0.1 s/co ratio (ref 0.0–0.9)

## 2019-07-27 LAB — CERVICOVAGINAL ANCILLARY ONLY
Chlamydia: NEGATIVE
Neisseria Gonorrhea: NEGATIVE

## 2019-07-28 ENCOUNTER — Telehealth: Payer: Self-pay | Admitting: Family Medicine

## 2019-07-28 LAB — CYTOLOGY - PAP
Adequacy: ABSENT — AB
HPV 16/18/45 genotyping: NEGATIVE
HPV: DETECTED — AB

## 2019-07-28 NOTE — Telephone Encounter (Signed)
Patient called and informed of status of cologuard testing.  Leslie Gates, [place]

## 2019-07-28 NOTE — Telephone Encounter (Signed)
Pt came into office regarding her colonoscopy that she was supposed to receive in the mail, and didn't receive. Please give pt a call.

## 2019-08-01 ENCOUNTER — Ambulatory Visit: Payer: Medicaid Other | Admitting: Internal Medicine

## 2019-08-04 ENCOUNTER — Other Ambulatory Visit: Payer: Self-pay | Admitting: Family Medicine

## 2019-08-17 DIAGNOSIS — B20 Human immunodeficiency virus [HIV] disease: Secondary | ICD-10-CM | POA: Diagnosis not present

## 2019-08-17 DIAGNOSIS — R0902 Hypoxemia: Secondary | ICD-10-CM | POA: Diagnosis not present

## 2019-08-17 DIAGNOSIS — R062 Wheezing: Secondary | ICD-10-CM | POA: Diagnosis not present

## 2019-08-17 DIAGNOSIS — G4733 Obstructive sleep apnea (adult) (pediatric): Secondary | ICD-10-CM | POA: Diagnosis not present

## 2019-08-24 ENCOUNTER — Other Ambulatory Visit: Payer: Self-pay | Admitting: Family Medicine

## 2019-08-24 ENCOUNTER — Encounter: Payer: Medicaid Other | Admitting: Obstetrics and Gynecology

## 2019-09-07 ENCOUNTER — Telehealth: Payer: Self-pay

## 2019-09-07 NOTE — Telephone Encounter (Signed)
Left message on machine to call back  

## 2019-09-07 NOTE — Telephone Encounter (Signed)
Per recall colon is due at the hospital with miralax prep

## 2019-09-08 ENCOUNTER — Encounter: Payer: Self-pay | Admitting: Gastroenterology

## 2019-09-08 NOTE — Telephone Encounter (Signed)
Left message on machine  Letter mailed

## 2019-09-13 ENCOUNTER — Inpatient Hospital Stay: Admission: RE | Admit: 2019-09-13 | Payer: Medicaid Other | Source: Ambulatory Visit

## 2019-09-17 DIAGNOSIS — B20 Human immunodeficiency virus [HIV] disease: Secondary | ICD-10-CM | POA: Diagnosis not present

## 2019-09-17 DIAGNOSIS — R0902 Hypoxemia: Secondary | ICD-10-CM | POA: Diagnosis not present

## 2019-09-17 DIAGNOSIS — R062 Wheezing: Secondary | ICD-10-CM | POA: Diagnosis not present

## 2019-09-17 DIAGNOSIS — G4733 Obstructive sleep apnea (adult) (pediatric): Secondary | ICD-10-CM | POA: Diagnosis not present

## 2019-09-20 ENCOUNTER — Other Ambulatory Visit: Payer: Self-pay | Admitting: Family Medicine

## 2019-09-20 DIAGNOSIS — I5032 Chronic diastolic (congestive) heart failure: Secondary | ICD-10-CM

## 2019-10-03 ENCOUNTER — Telehealth: Payer: Self-pay | Admitting: *Deleted

## 2019-10-03 NOTE — Telephone Encounter (Signed)
LVM to call office to see about scheduling Flu shot.Leslie Gates, CMA

## 2019-10-17 DIAGNOSIS — R0902 Hypoxemia: Secondary | ICD-10-CM | POA: Diagnosis not present

## 2019-10-17 DIAGNOSIS — B20 Human immunodeficiency virus [HIV] disease: Secondary | ICD-10-CM | POA: Diagnosis not present

## 2019-10-17 DIAGNOSIS — R062 Wheezing: Secondary | ICD-10-CM | POA: Diagnosis not present

## 2019-10-17 DIAGNOSIS — G4733 Obstructive sleep apnea (adult) (pediatric): Secondary | ICD-10-CM | POA: Diagnosis not present

## 2019-10-18 ENCOUNTER — Other Ambulatory Visit: Payer: Self-pay | Admitting: Family Medicine

## 2019-10-18 DIAGNOSIS — I5032 Chronic diastolic (congestive) heart failure: Secondary | ICD-10-CM

## 2019-10-30 ENCOUNTER — Telehealth: Payer: Self-pay

## 2019-10-30 NOTE — Telephone Encounter (Signed)
Called patient concerning Cologuard.  Patient states that she received a kit which she returned. Patient was called and told it was an insufficient sample.  Patient was told that another kit would be sent to her.  Patient received second kit, completed it and took it to [employer] over 2 month ago.  Patient is not sure of the exact date it was mailed out.  .Michelle R Simpson, CMA  

## 2019-11-02 ENCOUNTER — Other Ambulatory Visit: Payer: Self-pay | Admitting: Internal Medicine

## 2019-11-07 ENCOUNTER — Other Ambulatory Visit: Payer: Medicaid Other

## 2019-11-07 ENCOUNTER — Other Ambulatory Visit: Payer: Self-pay | Admitting: *Deleted

## 2019-11-07 ENCOUNTER — Other Ambulatory Visit: Payer: Self-pay

## 2019-11-07 DIAGNOSIS — B20 Human immunodeficiency virus [HIV] disease: Secondary | ICD-10-CM

## 2019-11-08 LAB — T-HELPER CELL (CD4) - (RCID CLINIC ONLY)
CD4 % Helper T Cell: 30 % — ABNORMAL LOW (ref 33–65)
CD4 T Cell Abs: 501 /uL (ref 400–1790)

## 2019-11-09 ENCOUNTER — Other Ambulatory Visit: Payer: Self-pay | Admitting: Internal Medicine

## 2019-11-09 DIAGNOSIS — B20 Human immunodeficiency virus [HIV] disease: Secondary | ICD-10-CM

## 2019-11-14 LAB — COMPLETE METABOLIC PANEL WITH GFR
AG Ratio: 1.2 (calc) (ref 1.0–2.5)
ALT: 20 U/L (ref 6–29)
AST: 17 U/L (ref 10–35)
Albumin: 4 g/dL (ref 3.6–5.1)
Alkaline phosphatase (APISO): 65 U/L (ref 37–153)
BUN: 11 mg/dL (ref 7–25)
CO2: 31 mmol/L (ref 20–32)
Calcium: 9.6 mg/dL (ref 8.6–10.4)
Chloride: 99 mmol/L (ref 98–110)
Creat: 0.84 mg/dL (ref 0.50–1.05)
GFR, Est African American: 93 mL/min/{1.73_m2} (ref >=60)
GFR, Est Non African American: 80 mL/min/{1.73_m2} (ref >=60)
Globulin: 3.4 g/dL (calc) (ref 1.9–3.7)
Glucose, Bld: 181 mg/dL — ABNORMAL HIGH (ref 65–99)
Potassium: 4.3 mmol/L (ref 3.5–5.3)
Sodium: 139 mmol/L (ref 135–146)
Total Bilirubin: 0.3 mg/dL (ref 0.2–1.2)
Total Protein: 7.4 g/dL (ref 6.1–8.1)

## 2019-11-14 LAB — CBC WITH DIFFERENTIAL/PLATELET
Absolute Monocytes: 423 cells/uL (ref 200–950)
Basophils Absolute: 51 cells/uL (ref 0–200)
Basophils Relative: 1.1 %
Eosinophils Absolute: 170 cells/uL (ref 15–500)
Eosinophils Relative: 3.7 %
HCT: 38.6 % (ref 35.0–45.0)
Hemoglobin: 12.8 g/dL (ref 11.7–15.5)
Lymphs Abs: 1909 cells/uL (ref 850–3900)
MCH: 29.8 pg (ref 27.0–33.0)
MCHC: 33.2 g/dL (ref 32.0–36.0)
MCV: 89.8 fL (ref 80.0–100.0)
MPV: 10.5 fL (ref 7.5–12.5)
Monocytes Relative: 9.2 %
Neutro Abs: 2047 cells/uL (ref 1500–7800)
Neutrophils Relative %: 44.5 %
Platelets: 295 10*3/uL (ref 140–400)
RBC: 4.3 10*6/uL (ref 3.80–5.10)
RDW: 12.5 % (ref 11.0–15.0)
Total Lymphocyte: 41.5 %
WBC: 4.6 10*3/uL (ref 3.8–10.8)

## 2019-11-14 LAB — HIV-1 RNA QUANT-NO REFLEX-BLD
HIV 1 RNA Quant: <20 copies/mL
HIV-1 RNA Quant, Log: <1.3 Log copies/mL

## 2019-11-17 DIAGNOSIS — R0902 Hypoxemia: Secondary | ICD-10-CM | POA: Diagnosis not present

## 2019-11-17 DIAGNOSIS — B20 Human immunodeficiency virus [HIV] disease: Secondary | ICD-10-CM | POA: Diagnosis not present

## 2019-11-17 DIAGNOSIS — R062 Wheezing: Secondary | ICD-10-CM | POA: Diagnosis not present

## 2019-11-17 DIAGNOSIS — G4733 Obstructive sleep apnea (adult) (pediatric): Secondary | ICD-10-CM | POA: Diagnosis not present

## 2019-11-22 ENCOUNTER — Other Ambulatory Visit: Payer: Self-pay

## 2019-11-22 ENCOUNTER — Ambulatory Visit (INDEPENDENT_AMBULATORY_CARE_PROVIDER_SITE_OTHER): Payer: Medicaid Other | Admitting: Internal Medicine

## 2019-11-22 VITALS — BP 145/86 | HR 66 | Temp 98.0°F | Wt 226.0 lb

## 2019-11-22 DIAGNOSIS — Z79899 Other long term (current) drug therapy: Secondary | ICD-10-CM | POA: Diagnosis not present

## 2019-11-22 DIAGNOSIS — B20 Human immunodeficiency virus [HIV] disease: Secondary | ICD-10-CM | POA: Diagnosis not present

## 2019-11-22 DIAGNOSIS — I10 Essential (primary) hypertension: Secondary | ICD-10-CM | POA: Diagnosis not present

## 2019-11-22 NOTE — Progress Notes (Signed)
RFV: follow up for hiv disease  Patient ID: Leslie Gates, female   DOB: December 19, 1965, 54 y.o.   MRN: [id number]  HPI 53yo F with hiv disease, CD 4 count 501/VL<20 on genovya. Has been on good health. Continues to take genvoya daily.Only place she goes to the store or doctors. Take good precautions. Avoids crowds. Stays to herself. Not interested in vaccine at the moment.  Outpatient Encounter Medications as of 11/22/2019  Medication Sig  . amLODipine (NORVASC) 10 MG tablet Take 1 tablet (10 mg total) by mouth daily.  Marland Kitchen atorvastatin (LIPITOR) 20 MG tablet Take 1 tablet (20 mg total) by mouth daily.  . beclomethasone (BECONASE-AQ) 42 MCG/SPRAY nasal spray Place 1 spray into both nostrils 2 (two) times daily. Dose is for each nostril. (Patient taking differently: Place 1 spray into both nostrils 2 (two) times daily as needed for allergies or rhinitis. )  . cetirizine (ZYRTEC) 10 MG tablet TAKE ONE TABLET BY MOUTH DAILY  . furosemide (LASIX) 20 MG tablet TAKE ONE TABLET BY MOUTH EVERY DAY. MAY TAKE EXTRA TABLET EVERY DAY AS NEEDED FOR WEIGHT GAIN 3 LBS OR MORE OVERNIGHT  . GENVOYA 150-150-200-10 MG TABS tablet TAKE 1 TABLET BY MOUTH EVERY DAY WITH BREAKFAST  . hydrocortisone 1 % ointment APPLY TO AFFECTED AREA 2 TIMES A DAY  . metFORMIN (GLUCOPHAGE-XR) 500 MG 24 hr tablet Take 1 tablet (500 mg total) by mouth daily with breakfast.  . montelukast (SINGULAIR) 10 MG tablet TAKE ONE TABLET BY MOUTH EVERY NIGHT AT BEDTIME.**APPOINTMENT NEEDED FOR FURTHER REFILLS**  . Multiple Vitamins-Minerals (TAB-A-VITE) TABS TAKE ONE TABLET BY MOUTH DAILY  . NAPHCON-A 0.025-0.3 % ophthalmic solution INSTILL 1 DROP INTO BOTH EYES FOUR TIMES A DAY AS NEEDED FOR IRRITATION  . omeprazole (PRILOSEC) 40 MG capsule TAKE ONE CAPSULE BY MOUTH DAILY  . OXYGEN Inhale 2 L into the lungs at bedtime.  Marland Kitchen PROAIR HFA 108 (90 Base) MCG/ACT inhaler INHALE 1 OR 2 PUFFS INTO THE LUNGS EVERY 6 HOURS AS NEEDED FOR WHEEZING OR  SHORTNESS OF BREATH  . spironolactone (ALDACTONE) 25 MG tablet Take 0.5 tablets (12.5 mg total) by mouth at bedtime.  . SYMBICORT 160-4.5 MCG/ACT inhaler INHALE 2 PUFFS INTO THE LUNGS TWO TIMES DAILY  . [DISCONTINUED] naphazoline-pheniramine (NAPHCON-A) 0.025-0.3 % ophthalmic solution INSTILL 1 DROP INTO BOTH EYES FOUR TIMES A DAY AS NEEDED FOR IRRITATION   No facility-administered encounter medications on file as of 11/22/2019.     Patient Active Problem List   Diagnosis Date Noted  . O2 dependent 07/22/2018  . Hyperlipidemia 04/29/2018  . Chronic diastolic heart failure (HCC) 06/25/2017  . Prediabetes 04/16/2016  . Moderate dysplasia of cervix 07/10/2013  . History of angioedema 09/09/2011  . Obesity hypoventilation syndrome (HCC) 07/10/2010  . Morbid obesity (HCC) 07/01/2010  . Obstructive sleep apnea 06/27/2010  . Pulmonary hypertension (HCC) 05/28/2010  . Primary hypertension 03/22/2007  . HIV disease (HCC) 06/21/2002     Health Maintenance Due  Topic Date Due  . OPHTHALMOLOGY EXAM  07/05/1977  . URINE MICROALBUMIN  07/05/1977  . Fecal DNA (Cologuard)  07/05/2017  . MAMMOGRAM  01/05/2019  . INFLUENZA VACCINE  06/17/2019  . HEMOGLOBIN A1C  10/31/2019     Review of Systems Review of Systems  Constitutional: Negative for fever, chills, diaphoresis, activity change, appetite change, fatigue and unexpected weight change.  HENT: Negative for congestion, sore throat, rhinorrhea, sneezing, trouble swallowing and sinus pressure.  Eyes: Negative for photophobia and visual disturbance.  Respiratory:  Negative for cough, chest tightness, shortness of breath, wheezing and stridor.  Cardiovascular: Negative for chest pain, palpitations and leg swelling.  Gastrointestinal: Negative for nausea, vomiting, abdominal pain, diarrhea, constipation, blood in stool, abdominal distention and anal bleeding.  Genitourinary: Negative for dysuria, hematuria, flank pain and difficulty urinating.   Musculoskeletal: Negative for myalgias, back pain, joint swelling, arthralgias and gait problem.  Skin: Negative for color change, pallor, rash and wound.  Neurological: Negative for dizziness, tremors, weakness and light-headedness.  Hematological: Negative for adenopathy. Does not bruise/bleed easily.  Psychiatric/Behavioral: Negative for behavioral problems, confusion, sleep disturbance, dysphoric mood, decreased concentration and agitation.   Social History   Tobacco Use  . Smoking status: Never Smoker  . Smokeless tobacco: Never Used  Substance Use Topics  . Alcohol use: No    Alcohol/week: 0.0 standard drinks  . Drug use: No    Physical Exam   BP (!) 145/86   Pulse 66   Temp 98 F (36.7 C)   Wt 226 lb (102.5 kg)   LMP 11/01/2019 (Approximate)   BMI 40.03 kg/m   Physical Exam  Constitutional:  oriented to person, place, and time. appears well-developed and well-nourished. No distress.  HENT: [place]/AT, PERRLA, no scleral icterus Mouth/Throat: Oropharynx is clear and moist. No oropharyngeal exudate.  Cardiovascular: Normal rate, regular rhythm and normal heart sounds. Exam reveals no gallop and no friction rub.  No murmur heard.  Pulmonary/Chest: Effort normal and breath sounds normal. No respiratory distress.  has no wheezes.  Neck = supple, no nuchal rigidity Abdominal: Soft. Bowel sounds are normal.  exhibits no distension. There is no tenderness.  Lymphadenopathy: no cervical adenopathy. No axillary adenopathy Neurological: alert and oriented to person, place, and time.  Skin: Skin is warm and dry. No rash noted. No erythema.  Psychiatric: a normal mood and affect.  behavior is normal.   Lab Results  Component Value Date   CD4TCELL 30 (L) 11/07/2019   Lab Results  Component Value Date   CD4TABS 501 11/07/2019   CD4TABS 739 04/12/2019   CD4TABS 560 10/06/2018   Lab Results  Component Value Date   HIV1RNAQUANT <20 NOT DETECTED 11/07/2019   Lab Results   Component Value Date   HEPBSAB NEG 02/08/2009   Lab Results  Component Value Date   LABRPR Non Reactive 07/25/2019    CBC Lab Results  Component Value Date   WBC 4.6 11/07/2019   RBC 4.30 11/07/2019   HGB 12.8 11/07/2019   HCT 38.6 11/07/2019   PLT 295 11/07/2019   MCV 89.8 11/07/2019   MCH 29.8 11/07/2019   MCHC 33.2 11/07/2019   RDW 12.5 11/07/2019   LYMPHSABS 1,909 11/07/2019   MONOABS 517 03/30/2017   EOSABS 170 11/07/2019    BMET Lab Results  Component Value Date   NA 139 11/07/2019   K 4.3 11/07/2019   CL 99 11/07/2019   CO2 31 11/07/2019   GLUCOSE 181 (H) 11/07/2019   BUN 11 11/07/2019   CREATININE 0.84 11/07/2019   CALCIUM 9.6 11/07/2019   GFRNONAA 80 11/07/2019   GFRAA 93 11/07/2019      Assessment and Plan   hiv disease= continue on genvoya.well controlled  Long term medication management = cr stable  Health maintenance = colonoscopy this year. Mammogram is scheduled to coming up.  htn = not at goal. Have her follow up with pcp. likley need to increase aldactone

## 2019-11-29 DIAGNOSIS — Z03818 Encounter for observation for suspected exposure to other biological agents ruled out: Secondary | ICD-10-CM | POA: Diagnosis not present

## 2019-11-29 DIAGNOSIS — Z1159 Encounter for screening for other viral diseases: Secondary | ICD-10-CM | POA: Diagnosis not present

## 2019-12-06 ENCOUNTER — Other Ambulatory Visit: Payer: Self-pay

## 2019-12-06 ENCOUNTER — Ambulatory Visit: Payer: Medicaid Other

## 2019-12-06 ENCOUNTER — Ambulatory Visit
Admission: RE | Admit: 2019-12-06 | Discharge: 2019-12-06 | Disposition: A | Discharge status: Home / Self Care | Payer: Medicaid Other | Source: Ambulatory Visit | Attending: Obstetrics and Gynecology | Admitting: Obstetrics and Gynecology

## 2019-12-06 DIAGNOSIS — Z01419 Encounter for gynecological examination (general) (routine) without abnormal findings: Secondary | ICD-10-CM

## 2019-12-06 DIAGNOSIS — Z1231 Encounter for screening mammogram for malignant neoplasm of breast: Secondary | ICD-10-CM | POA: Diagnosis not present

## 2019-12-08 ENCOUNTER — Encounter: Payer: Self-pay | Admitting: Family Medicine

## 2019-12-10 NOTE — Progress Notes (Signed)
@Patient  ID: , female    DOB: [birth year], 54 y.o.   MRN: [id number]  Chief Complaint  Patient presents with  . Follow-up    SOB, needs CPAP and oxygen renewed    Referring provider: [id number], DO  HPI:  54 year old female never smoker followed in our office for obstructive sleep apnea and obesity hypoventilation syndrome  PMH: HIV positive, pulmonary hypertension, hypertension, prediabetes, chronic diastolic heart failure, hyperlipidemia Smoker/ Smoking History: Never smoker Maintenance:  None  Pt of: Dr. 44  12/11/2019  - Visit   54 year old female never smoker followed in our office for obstructive sleep apnea and obesity hypoventilation syndrome.  Patient presenting to office today as a follow-up visit.  CPAP compliance report shows excellent compliance.  See CPAP compliance report listed below:  11/07/2019-12/05/2018 21-30 on a last 30 days use, all 30 those days greater than 4 hours, average usage 8 hours and 49 minutes, CPAP set pressure of 12, AHI 5.1  Patient reporting that she does feel that she is had increased shortness of breath over the last 3 months.  Her weights are elevated.  She is not currently weighing herself daily.  She is followed by primary care for this.  She reports adherence to her diuretic medications.  Blood pressure is also elevated.  Walk today in office reveals minor oxygen desaturations.  Patient's weight is up close to 10 pounds since last office visit.  Tests:   Sleep tests: PSG 06/08/04 >> RDI 92.1, oxygen nadir 57% ONO on RA 08/03/13 >> 203 min with SpO2 < 88% Split night 12/22/13 >> AHI 27, SaO2 low 77%. CPAP 12 cm H2O >> AHI 5.9, +R, +S. Did not need supplemental oxygen. ONO with CPAP and 2 liters 02/13/14 >> Test time 8 hrs 36 min. Basal SpO2 98%, low SpO2 63% (artifact). Spent 0.4 min with SpO2 < 88%.    Pulmonary tests PFT 01/29/14 >> FEV1 1.79 (81%), FEV1% 86, TLC 3.47 (74%), DLCO 71% PFT 04/29/15 >> FEV1 1.53  (66%), FEV1% 80, TLC 3.74 (76%), DLCO 46%  Cardiac tests Echo 04/12/15 >> EF 65 to 70%, PAS 35 mmHg   FENO:  No results found for: NITRICOXIDE  PFT: PFT Results Latest Ref Rng & Units 04/29/2015 01/29/2014  FVC-Pre L 1.93 2.06  FVC-Predicted Pre % 68 76  FVC-Post L 1.78 2.07  FVC-Predicted Post % 62 77  Pre FEV1/FVC % % 80 82  Post FEV1/FCV % % 85 86  FEV1-Pre L 1.53 1.68  FEV1-Predicted Pre % 66 77  FEV1-Post L 1.51 1.79  DLCO UNC% % 46 71  DLCO COR %Predicted % 95 101  TLC L 3.74 3.47  TLC % Predicted % 76 74  RV % Predicted % 104 80    WALK:  SIX MIN WALK 12/11/2019 04/25/2014 08/01/2013 08/27/2011  Supplimental Oxygen during Test? (L/min) No No No No  Tech Comments: Patient walked at moderate pace, SOB, unable to finish 3rd lap due to sats of 84%, LIP aware of results Pt completed walk at a brisk pace. No complaints. AMG - -    Imaging: MM Digital Screening  Result Date: 12/07/2019 CLINICAL DATA:  Screening. EXAM: DIGITAL SCREENING BILATERAL MAMMOGRAM WITH CAD COMPARISON:  Previous exam(s). ACR Breast Density Category b: There are scattered areas of fibroglandular density. FINDINGS: There are no findings suspicious for malignancy. Images were processed with CAD. IMPRESSION: No mammographic evidence of malignancy. A result letter of this screening mammogram will be mailed directly to the patient.  RECOMMENDATION: Screening mammogram in one year. (Code:SM-B-01Y) BI-RADS CATEGORY  1: Negative. Electronically Signed   By: Abelardo Diesel M.D.   On: 12/07/2019 13:51    Lab Results:  CBC    Component Value Date/Time   WBC 4.6 11/07/2019 1003   RBC 4.30 11/07/2019 1003   HGB 12.8 11/07/2019 1003   HCT 38.6 11/07/2019 1003   PLT 295 11/07/2019 1003   MCV 89.8 11/07/2019 1003   MCH 29.8 11/07/2019 1003   MCHC 33.2 11/07/2019 1003   RDW 12.5 11/07/2019 1003   LYMPHSABS 1,909 11/07/2019 1003   MONOABS 517 03/30/2017 1555   EOSABS 170 11/07/2019 1003   BASOSABS 51 11/07/2019  1003    BMET    Component Value Date/Time   NA 139 11/07/2019 1003   K 4.3 11/07/2019 1003   CL 99 11/07/2019 1003   CO2 31 11/07/2019 1003   GLUCOSE 181 (H) 11/07/2019 1003   BUN 11 11/07/2019 1003   CREATININE 0.84 11/07/2019 1003   CALCIUM 9.6 11/07/2019 1003   GFRNONAA 80 11/07/2019 1003   GFRAA 93 11/07/2019 1003    BNP    Component Value Date/Time   BNP 6.9 06/25/2017 1135    ProBNP    Component Value Date/Time   PROBNP 13.0 12/11/2019 1113    Specialty Problems      Pulmonary Problems   Shortness of breath    Qualifier: Diagnosis of  By: Tomma Lightning MD, Kelly        Obstructive sleep apnea           Obesity hypoventilation syndrome (HCC)           O2 dependent      Allergies  Allergen Reactions  . Lisinopril Swelling    Very swollen bottom lip    Immunization History  Administered Date(s) Administered  . HPV 9-valent 04/28/2018  . Hepatitis B 11/17/1999, 09/12/2003, 09/29/2004  . Influenza Split 08/12/2011, 08/13/2017  . Influenza Whole 08/19/2005, 08/17/2007, 05/14/2010, 08/18/2010, 08/16/2012  . Influenza,inj,Quad PF,6+ Mos 07/25/2013, 09/16/2015, 07/30/2016  . Influenza-Unspecified 09/26/2014, 11/17/2017  . PPD Test 12/29/2004  . Pneumococcal Conjugate-13 05/01/2019  . Pneumococcal Polysaccharide-23 07/31/2002, 09/06/2007, 12/27/2012    Past Medical History:  Diagnosis Date  . Allergic rhinitis   . Allergy   . Anemia   . Anxiety   . Arthritis   . Asthma   . Chronic otitis media   . Coronary artery disease   . Depression   . Diastolic CHF, chronic ([place])   . Dysphagia   . GERD (gastroesophageal reflux disease)   . History of cervical dysplasia   . History of herpes zoster   . HTN (hypertension)   . Human immunodeficiency virus (HIV) ([place]) 2001  . Hyperlipidemia   . Obesity   . OSA (obstructive sleep apnea)   . Oxygen deficiency   . Pulmonary HTN ([place])   . Sleep apnea   . Wrist fracture, bilateral     Tobacco  History: Social History   Tobacco Use  Smoking Status Never Smoker  Smokeless Tobacco Never Used   Counseling given: Yes   Continue to not smoke  Outpatient Encounter Medications as of 12/11/2019  Medication Sig  . amLODipine (NORVASC) 10 MG tablet Take 1 tablet (10 mg total) by mouth daily.  Marland Kitchen atorvastatin (LIPITOR) 20 MG tablet Take 1 tablet (20 mg total) by mouth daily.  . beclomethasone (BECONASE-AQ) 42 MCG/SPRAY nasal spray Place 1 spray into both nostrils 2 (two) times daily. Dose is for each  nostril. (Patient taking differently: Place 1 spray into both nostrils 2 (two) times daily as needed for allergies or rhinitis. )  . cetirizine (ZYRTEC) 10 MG tablet TAKE ONE TABLET BY MOUTH DAILY  . furosemide (LASIX) 20 MG tablet TAKE ONE TABLET BY MOUTH EVERY DAY. MAY TAKE EXTRA TABLET EVERY DAY AS NEEDED FOR WEIGHT GAIN 3 LBS OR MORE OVERNIGHT  . GENVOYA 150-150-200-10 MG TABS tablet TAKE 1 TABLET BY MOUTH EVERY DAY WITH BREAKFAST  . hydrocortisone 1 % ointment APPLY TO AFFECTED AREA 2 TIMES A DAY  . metFORMIN (GLUCOPHAGE-XR) 500 MG 24 hr tablet Take 1 tablet (500 mg total) by mouth daily with breakfast.  . montelukast (SINGULAIR) 10 MG tablet TAKE ONE TABLET BY MOUTH EVERY NIGHT AT BEDTIME.**APPOINTMENT NEEDED FOR FURTHER REFILLS**  . Multiple Vitamins-Minerals (TAB-A-VITE) TABS TAKE ONE TABLET BY MOUTH DAILY  . NAPHCON-A 0.025-0.3 % ophthalmic solution INSTILL 1 DROP INTO BOTH EYES FOUR TIMES A DAY AS NEEDED FOR IRRITATION  . omeprazole (PRILOSEC) 40 MG capsule TAKE ONE CAPSULE BY MOUTH DAILY  . OXYGEN Inhale 2 L into the lungs at bedtime.  Marland Kitchen. PROAIR HFA 108 (90 Base) MCG/ACT inhaler INHALE 1 OR 2 PUFFS INTO THE LUNGS EVERY 6 HOURS AS NEEDED FOR WHEEZING OR SHORTNESS OF BREATH  . spironolactone (ALDACTONE) 25 MG tablet Take 0.5 tablets (12.5 mg total) by mouth at bedtime.  . SYMBICORT 160-4.5 MCG/ACT inhaler INHALE 2 PUFFS INTO THE LUNGS TWO TIMES DAILY  . [DISCONTINUED]  naphazoline-pheniramine (NAPHCON-A) 0.025-0.3 % ophthalmic solution INSTILL 1 DROP INTO BOTH EYES FOUR TIMES A DAY AS NEEDED FOR IRRITATION   No facility-administered encounter medications on file as of 12/11/2019.     Review of Systems  Review of Systems  Constitutional: Positive for activity change, fatigue and unexpected weight change. Negative for fever.  HENT: Negative for sinus pressure, sinus pain and sore throat.   Respiratory: Positive for shortness of breath. Negative for cough and wheezing.   Cardiovascular: Positive for leg swelling. Negative for chest pain and palpitations.  Musculoskeletal: Negative for arthralgias.  Neurological: Negative for dizziness.  Psychiatric/Behavioral: Negative for sleep disturbance. The patient is not nervous/anxious.      Physical Exam  BP (!) 158/90 (BP Location: Left Arm, Cuff Size: Large)   Pulse 68   Temp (!) 97.5 F (36.4 C) (Temporal)   Ht 5\' 3"  (1.6 m)   Wt 230 lb 6.4 oz (104.5 kg)   SpO2 94% Comment: RA  BMI 40.81 kg/m   Wt Readings from Last 5 Encounters:  12/11/19 230 lb 6.4 oz (104.5 kg)  11/22/19 226 lb (102.5 kg)  07/25/19 219 lb 6.4 oz (99.5 kg)  06/29/19 219 lb 12.8 oz (99.7 kg)  05/01/19 220 lb (99.8 kg)    BMI Readings from Last 5 Encounters:  12/11/19 40.81 kg/m  11/22/19 40.03 kg/m  07/25/19 38.86 kg/m  06/29/19 38.94 kg/m  05/01/19 38.97 kg/m     Physical Exam Vitals and nursing note reviewed.  Constitutional:      General: She is not in acute distress.    Appearance: Normal appearance. She is obese.  HENT:     Head: Normocephalic and atraumatic.     Right Ear: Tympanic membrane, ear canal and external ear normal. There is no impacted cerumen.     Left Ear: Tympanic membrane, ear canal and external ear normal. There is no impacted cerumen.     Nose: Nose normal. No congestion.     Mouth/Throat:     Mouth:  Mucous membranes are moist.     Pharynx: Oropharynx is clear.  Eyes:     Pupils:  Pupils are equal, round, and reactive to light.  Cardiovascular:     Rate and Rhythm: Normal rate and regular rhythm.     Pulses: Normal pulses.     Heart sounds: Normal heart sounds. No murmur.  Pulmonary:     Breath sounds: Normal breath sounds. No decreased air movement. No decreased breath sounds, wheezing or rales.  Abdominal:     General: Abdomen is flat. Bowel sounds are normal.     Palpations: Abdomen is soft.     Comments: Abdominal edema?  Musculoskeletal:     Cervical back: Normal range of motion.     Right lower leg: Edema (2+) present.     Left lower leg: Edema (3+) present.  Skin:    General: Skin is warm and dry.     Capillary Refill: Capillary refill takes less than 2 seconds.  Neurological:     General: No focal deficit present.     Mental Status: She is alert and oriented to person, place, and time. Mental status is at baseline.     Gait: Gait normal.  Psychiatric:        Mood and Affect: Mood normal.        Behavior: Behavior normal.        Thought Content: Thought content normal.        Judgment: Judgment normal.       Assessment & Plan:   Chronic diastolic heart failure (HCC) Weight increase of 4 pounds over the last month per chart review Weight increase of 10 pounds since last office visit with our practice Patient is not currently weighing herself Patient reporting aspects of orthopnea as well as lower extremity swelling Patient reports adherence to spironolactone as well as Lasix Patient does not take an additional dose of Lasix as previously instructed by primary care based off of his prescription in chart  Plan: Lab work today Keep follow-up with primary care this week Take additional Lasix when you go home  Start weighing yourself every morning Follow-up with our office next week  Pulmonary hypertension (HCC) Plan: Continue CPAP Continue nocturnal oxygen Lab work today Take additional dose of Lasix today Keep close follow-up with  primary care Start weighing yourself every morning  Obesity hypoventilation syndrome (HCC) Plan: Continue 2 L of O2 with CPAP  Obstructive sleep apnea Plan: Continue CPAP therapy Continue oxygen therapy with CPAP  HIV disease (HCC) Plan: Keep follow-up with infectious disease and primary care  Morbid obesity (HCC) Plan: Take additional dose of Lasix today Start weighing yourself daily Work to increase overall physical activity to work to reduce your BMI  Shortness of breath Likely fluid overload  Plan: Lab work today Take additional dose of Lasix as already instructed by primary care Follow-up next week in office with a walk Keep follow-up with primary care later on this week  Primary hypertension Plan: Keep follow-up with primary care later on this week Take additional dose of Lasix today  O2 dependent Minor oxygen desaturation on third lap of walk today Patient likely fluid overloaded based off of exam  Plan: Lab work today We will bring patient back in 1 week to walk again, patient may require exertional oxygen if she desats again    Return in about 3 months (around 03/10/2020), or if symptoms worsen or fail to improve, for Follow up with Dr. Craige Cotta.   Arlys John P  Cherre Huger, NP 12/11/2019   This appointment required 42 minutes of patient care (this includes precharting, chart review, review of results, face-to-face care, etc.).

## 2019-12-11 ENCOUNTER — Encounter: Payer: Self-pay | Admitting: Pulmonary Disease

## 2019-12-11 ENCOUNTER — Other Ambulatory Visit: Payer: Self-pay

## 2019-12-11 ENCOUNTER — Ambulatory Visit (INDEPENDENT_AMBULATORY_CARE_PROVIDER_SITE_OTHER): Payer: Medicaid Other | Admitting: Pulmonary Disease

## 2019-12-11 ENCOUNTER — Other Ambulatory Visit: Payer: Self-pay | Admitting: Internal Medicine

## 2019-12-11 VITALS — BP 158/90 | HR 68 | Temp 97.5°F | Ht 63.0 in | Wt 230.4 lb

## 2019-12-11 DIAGNOSIS — R0602 Shortness of breath: Secondary | ICD-10-CM

## 2019-12-11 DIAGNOSIS — I272 Pulmonary hypertension, unspecified: Secondary | ICD-10-CM | POA: Diagnosis not present

## 2019-12-11 DIAGNOSIS — E662 Morbid (severe) obesity with alveolar hypoventilation: Secondary | ICD-10-CM | POA: Diagnosis not present

## 2019-12-11 DIAGNOSIS — I5032 Chronic diastolic (congestive) heart failure: Secondary | ICD-10-CM | POA: Diagnosis not present

## 2019-12-11 DIAGNOSIS — Z9981 Dependence on supplemental oxygen: Secondary | ICD-10-CM

## 2019-12-11 DIAGNOSIS — B20 Human immunodeficiency virus [HIV] disease: Secondary | ICD-10-CM

## 2019-12-11 DIAGNOSIS — I10 Essential (primary) hypertension: Secondary | ICD-10-CM

## 2019-12-11 DIAGNOSIS — G4733 Obstructive sleep apnea (adult) (pediatric): Secondary | ICD-10-CM

## 2019-12-11 LAB — BRAIN NATRIURETIC PEPTIDE: Pro B Natriuretic peptide (BNP): 13 pg/mL (ref 0.0–100.0)

## 2019-12-11 NOTE — Assessment & Plan Note (Signed)
Weight increase of 4 pounds over the last month per chart review Weight increase of 10 pounds since last office visit with our practice Patient is not currently weighing herself Patient reporting aspects of orthopnea as well as lower extremity swelling Patient reports adherence to spironolactone as well as Lasix Patient does not take an additional dose of Lasix as previously instructed by primary care based off of his prescription in chart  Plan: Lab work today Keep follow-up with primary care this week Take additional Lasix when you go home  Start weighing yourself every morning Follow-up with our office next week

## 2019-12-11 NOTE — Assessment & Plan Note (Signed)
Minor oxygen desaturation on third lap of walk today Patient likely fluid overloaded based off of exam  Plan: Lab work today We will bring patient back in 1 week to walk again, patient may require exertional oxygen if she desats again

## 2019-12-11 NOTE — Progress Notes (Signed)
Reviewed and agree with assessment/plan.   Navayah Sok, MD [facility] 11/11/2016, 12:24 PM Pager:  [phone]  

## 2019-12-11 NOTE — Assessment & Plan Note (Signed)
Plan: Keep follow-up with primary care later on this week Take additional dose of Lasix today

## 2019-12-11 NOTE — Assessment & Plan Note (Signed)
Plan: Take additional dose of Lasix today Start weighing yourself daily Work to increase overall physical activity to work to reduce your BMI

## 2019-12-11 NOTE — Assessment & Plan Note (Signed)
Plan: Keep follow-up with infectious disease and primary care

## 2019-12-11 NOTE — Assessment & Plan Note (Signed)
Plan: Continue CPAP therapy Continue oxygen therapy with CPAP

## 2019-12-11 NOTE — Patient Instructions (Addendum)
You were seen today by Lauraine Rinne, NP  for:   1. Obstructive sleep apnea 2. Obesity hypoventilation syndrome ([place])  We recommend that you continue using your CPAP daily >>>Keep up the hard work using your device >>> Goal should be wearing this for the entire night that you are sleeping, at least 4 to 6 hours  Remember:  . Do not drive or operate heavy machinery if tired or drowsy.  . Please notify the supply company and office if you are unable to use your device regularly due to missing supplies or machine being broken.  . Work on maintaining a healthy weight and following your recommended nutrition plan  . Maintain proper daily exercise and movement  . Maintaining proper use of your device can also help improve management of other chronic illnesses such as: Blood pressure, blood sugars, and weight management.   BiPAP/ CPAP Cleaning:  >>>Clean weekly, with Dawn soap, and bottle brush.  Set up to air dry. >>> Wipe mask out daily with wet wipe or towelette  Continue oxygen therapy as prescribed - 2L  >>>maintain oxygen saturations greater than 88 percent  >>>if unable to maintain oxygen saturations please contact the office  >>>do not smoke with oxygen  >>>can use nasal saline gel or nasal saline rinses to moisturize nose if oxygen causes dryness   3. Shortness of breath  - B Nat Peptide; Future  Walk today to assess   Keep follow up with PCP   Go home and take your additional Lasix dose as managed by primary care today  4. Pulmonary hypertension (HCC)  - B Nat Peptide; Future  5. Morbid obesity ([place])  Continue to work on losing weight   6. HIV disease ([place])  Continue to follow up with PCP and ID    We recommend today:  Orders Placed This Encounter  Procedures  . B Nat Peptide    Standing Status:   Future    Number of Occurrences:   1    Standing Expiration Date:   12/10/2020  . AMB REFERRAL FOR DME    Referral Priority:   Routine    Referral Type:   Durable  Medical Equipment Purchase    Number of Visits Requested:   1   Orders Placed This Encounter  Procedures  . B Nat Peptide  . AMB REFERRAL FOR DME   No orders of the defined types were placed in this encounter.   Follow Up:     Return in about 1 week (around 12/18/2019), or if symptoms worsen or fail to improve, for Follow up with Dr. Halford Chessman, Follow up with Wyn Quaker FNP-C, With walk in office.   Please do your part to reduce the spread of COVID-19:      Reduce your risk of any infection  and COVID19 by using the similar precautions used for avoiding the common cold or flu:  Marland Kitchen Wash your hands often with soap and warm water for at least 20 seconds.  If soap and water are not readily available, use an alcohol-based hand sanitizer with at least 60% alcohol.  . If coughing or sneezing, cover your mouth and nose by coughing or sneezing into the elbow areas of your shirt or coat, into a tissue or into your sleeve (not your hands). Langley Gauss A MASK when in public  . Avoid shaking hands with others and consider head nods or verbal greetings only. . Avoid touching your eyes, nose, or mouth with unwashed hands.  Marland Kitchen  Avoid close contact with people who are sick. . Avoid places or events with large numbers of people in one location, like concerts or sporting events. . If you have some symptoms but not all symptoms, continue to monitor at home and seek medical attention if your symptoms worsen. . If you are having a medical emergency, call 911.   ADDITIONAL HEALTHCARE OPTIONS FOR PATIENTS  [facility] / e-Visit: [email or web address]         [facility]: [phone]  [facility]: [phone]                   [facility]: [phone]     It is flu season:   >>> Best ways to protect herself from the flu: Receive the yearly flu vaccine, practice good hand hygiene washing with soap and also using hand  sanitizer when available, eat a nutritious meals, get adequate rest, hydrate appropriately   Please contact the office if your symptoms worsen or you have concerns that you are not improving.   Thank you for choosing [facility] for your healthcare, and for allowing [place] to partner with you on your healthcare journey. I am thankful to be able to provide care to you today.   Elisha Headland FNP-C

## 2019-12-11 NOTE — Assessment & Plan Note (Signed)
Plan: Continue 2 L of O2 with CPAP

## 2019-12-11 NOTE — Assessment & Plan Note (Addendum)
Plan: Continue CPAP Continue nocturnal oxygen Lab work today Take additional dose of Lasix today Keep close follow-up with primary care Start weighing yourself every morning

## 2019-12-11 NOTE — Assessment & Plan Note (Signed)
Likely fluid overload  Plan: Lab work today Take additional dose of Lasix as already instructed by primary care Follow-up next week in office with a walk Keep follow-up with primary care later on this week

## 2019-12-12 DIAGNOSIS — Z03818 Encounter for observation for suspected exposure to other biological agents ruled out: Secondary | ICD-10-CM | POA: Diagnosis not present

## 2019-12-12 DIAGNOSIS — Z1159 Encounter for screening for other viral diseases: Secondary | ICD-10-CM | POA: Diagnosis not present

## 2019-12-12 DIAGNOSIS — Z5181 Encounter for therapeutic drug level monitoring: Secondary | ICD-10-CM | POA: Diagnosis not present

## 2019-12-14 NOTE — Progress Notes (Signed)
Patient aware BNP not elevated. She was made aware of Brian's recommendation re: lasix and her weight. Notified if weight increases greater than 3lbs she needs to follow instructions given by pcp to take additional dose of lasix. Patient has a follow up with pcp on tomorrow. Nothing further needed.

## 2019-12-15 ENCOUNTER — Ambulatory Visit (INDEPENDENT_AMBULATORY_CARE_PROVIDER_SITE_OTHER): Payer: Medicaid Other | Admitting: Family Medicine

## 2019-12-15 ENCOUNTER — Encounter: Payer: Self-pay | Admitting: Family Medicine

## 2019-12-15 ENCOUNTER — Other Ambulatory Visit: Payer: Self-pay

## 2019-12-15 VITALS — BP 130/80 | HR 76 | Wt 225.4 lb

## 2019-12-15 DIAGNOSIS — I5032 Chronic diastolic (congestive) heart failure: Secondary | ICD-10-CM

## 2019-12-15 DIAGNOSIS — I10 Essential (primary) hypertension: Secondary | ICD-10-CM | POA: Diagnosis not present

## 2019-12-15 DIAGNOSIS — E1165 Type 2 diabetes mellitus with hyperglycemia: Secondary | ICD-10-CM | POA: Insufficient documentation

## 2019-12-15 DIAGNOSIS — E118 Type 2 diabetes mellitus with unspecified complications: Secondary | ICD-10-CM

## 2019-12-15 DIAGNOSIS — E782 Mixed hyperlipidemia: Secondary | ICD-10-CM | POA: Diagnosis not present

## 2019-12-15 DIAGNOSIS — Z23 Encounter for immunization: Secondary | ICD-10-CM

## 2019-12-15 DIAGNOSIS — E119 Type 2 diabetes mellitus without complications: Secondary | ICD-10-CM | POA: Insufficient documentation

## 2019-12-15 LAB — POCT GLYCOSYLATED HEMOGLOBIN (HGB A1C): HbA1c, POC (controlled diabetic range): 7 % (ref 0.0–7.0)

## 2019-12-15 LAB — POCT UA - MICROALBUMIN
Albumin/Creatinine Ratio, Urine, POC: <30
Creatinine, POC: 300 mg/dL
Microalbumin Ur, POC: 30 mg/L

## 2019-12-15 MED ORDER — METFORMIN HCL ER 500 MG PO TB24: 1000.0000 mg | ORAL_TABLET | Freq: Every day | ORAL | 3 refills | Status: DC

## 2019-12-15 NOTE — Assessment & Plan Note (Signed)
Tolerating Lipitor 20mg  QD. Endorses compliance. Continue as prescribed. Was unable to obtain labs today. Will plan to obtain Lipid panel at follow up.

## 2019-12-15 NOTE — Patient Instructions (Signed)
Please take 2 pills of metformin every morning with a meal. Increase to 4 pills of metformin every morning in March 1st. If you get any upset stomach, let me know, you can try 3 pills every morning with a meal.   I'll call you with the remaining results.  Lets plan to follow up in 3 months for diabetes (May 2021)

## 2019-12-15 NOTE — Progress Notes (Signed)
Subjective:   Patient ID: Leslie Gates    DOB: Apr 13, 1966, 54 y.o. female   MRN: [id number]  Leslie Gates is a 54 y.o. female with a history of diastolic CHF, HTN, pulmonary HTN, OHS/OSA, T2DM, HIV, HLD, obesity here for diabetes, HTN, and HLD follow up.   Diabetes: Last A1C 6.6, A1C today 7.0. Currently on Metformin 500mg  ER QD. Endorses compliance. Denies any polyuria, polydipsia, polyphagia. Due for Diabetic eye exam, diabetic foot exam,  and urine microalbumin. Patient notes she knows it is her diet - she eats lots of rice, cookies, sweets. Patient voiced motivation to cut back. Weight stable at 225lbs.  HTN:  BP 130/80 today. Currently on Amoldipine 10mg  QD. Endorses compliance. Denies any chest pain, SOB, vision changes, or headaches.   HLD: Last lipid panel 10/06/18. Currently on Lipitor 20mg  QD. Endorses compliance. Denies any muscles aches or weakness.  Review of Systems:  Per HPI.   [place], medications and smoking status reviewed.  Objective:   BP 130/80   Pulse 76   Wt 225 lb 6 oz (102.2 kg)   LMP 11/14/2019   SpO2 96%   BMI 39.92 kg/m  Vitals and nursing note reviewed.  General: pleasant older AA female, well nourished, well developed, in no acute distress with non-toxic appearance, sitting comfortably in exam chair HEENT: normocephalic, atraumatic, moist mucous membranes CV: regular rate and rhythm without murmurs, rubs, or gallops, mild edema LE bilaterally  Lungs: clear to auscultation bilaterally with normal work of breathing Skin: warm, dry Extremities: warm and well perfused MSK:  gait normal Neuro: Alert and oriented, speech normal  Diabetic foot exam was performed with the following findings:   No deformities, ulcerations, or other skin breakdown Normal sensation of 10g monofilament Intact posterior tibialis and dorsalis pedis pulses Mildly decreased sensation of right toes Mild swelling of LE bilaterally     Assessment & Plan:   Primary  hypertension Blood pressure at goal today. Tolerating Amlodipine 10mg  QD with Lasix and Spironolactone for fluid control/CHF. History of angioedema with ACE. Some mild LE edema today. Patient instructed to continue Amlodipine daily. Discussed life style modifications - patient is amendable to this.  Chronic diastolic heart failure (HCC) Weight up about 5lbs from baseline in chart with some mild edema of LE bilaterally. Lungs clear on ausculation. Recommended leg elevation and compression stockings. Continue Lasix 20mg  QD with additional dose if 3lb weight gain + Spironolactone as prescribed. Unable to obtain labs today prior to patient leaving. Will plan to obtain at follow up. Urine microalbumin WNL.  - consider addition of SGLT-2 for CV, kidney, and diabetes benefits  Hyperlipidemia Tolerating Lipitor 20mg  QD. Endorses compliance. Continue as prescribed. Was unable to obtain labs today. Will plan to obtain Lipid panel at follow up.   Controlled type 2 diabetes mellitus with complication, without long-term current use of insulin (HCC) Worsening. A1C 7.0 today, up from 6.6 previously. Tolerating Metformin 500mg  QD.  - Will increase to 1000mg  QD.  - Consider addition of SGLT-2 at follow up given her other comorbidities and the added benefit - Diabetic foot exam performed today.  - Decreased sensation at right toes. Discussed daily foot inspection given decreased sensation. Patient understood and agreed.  - Will discuss diabetic eye exam at follow up - follow up 3 months for repeat labs - A1C, CMP, diabetic eye exam referral   Health Maintenance: TDAP and Flu vaccine given today Diabetic foot exam completed today  Discuss diabetic and colon screen at  next visit   Orders Placed This Encounter  Procedures  . Tdap vaccine greater than or equal to 7yo IM  . Flu Vaccine QUAD 36+ mos IM  . HgB A1c  . POCT UA - Microalbumin   Meds ordered this encounter  Medications  . metFORMIN  (GLUCOPHAGE-XR) 500 MG 24 hr tablet    Sig: Take 2 tablets (1,000 mg total) by mouth daily with breakfast. In 1 month, increase to 4 tablets (2000mg  total) daily.    Dispense:  360 tablet    Refill:  3   , DO PGY-2, [facility] 12/17/2019 5:11 PM

## 2019-12-15 NOTE — Assessment & Plan Note (Signed)
Worsening. A1C 7.0 today, up from 6.6 previously. Tolerating Metformin 500mg  QD.  - Will increase to 1000mg  QD.  - Consider addition of SGLT-2 at follow up given her other comorbidities and the added benefit - Diabetic foot exam performed today.  - Decreased sensation at right toes. Discussed daily foot inspection given decreased sensation. Patient understood and agreed.  - Will discuss diabetic eye exam at follow up - follow up 3 months for repeat labs - A1C, CMP, diabetic eye exam referral

## 2019-12-15 NOTE — Assessment & Plan Note (Addendum)
Blood pressure at goal today. Tolerating Amlodipine 10mg  QD with Lasix and Spironolactone for fluid control/CHF. History of angioedema with ACE. Some mild LE edema today. Patient instructed to continue Amlodipine daily. Discussed life style modifications - patient is amendable to this.

## 2019-12-15 NOTE — Assessment & Plan Note (Addendum)
Weight up about 5lbs from baseline in chart with some mild edema of LE bilaterally. Lungs clear on ausculation. Recommended leg elevation and compression stockings. Continue Lasix 20mg  QD with additional dose if 3lb weight gain + Spironolactone as prescribed. Unable to obtain labs today prior to patient leaving. Will plan to obtain at follow up. Urine microalbumin WNL.  - consider addition of SGLT-2 for CV, kidney, and diabetes benefits

## 2019-12-18 ENCOUNTER — Other Ambulatory Visit: Payer: Medicaid Other

## 2019-12-18 DIAGNOSIS — R062 Wheezing: Secondary | ICD-10-CM | POA: Diagnosis not present

## 2019-12-18 DIAGNOSIS — B20 Human immunodeficiency virus [HIV] disease: Secondary | ICD-10-CM | POA: Diagnosis not present

## 2019-12-18 DIAGNOSIS — Z5181 Encounter for therapeutic drug level monitoring: Secondary | ICD-10-CM | POA: Diagnosis not present

## 2019-12-18 DIAGNOSIS — R0902 Hypoxemia: Secondary | ICD-10-CM | POA: Diagnosis not present

## 2019-12-18 DIAGNOSIS — G4733 Obstructive sleep apnea (adult) (pediatric): Secondary | ICD-10-CM | POA: Diagnosis not present

## 2019-12-21 ENCOUNTER — Ambulatory Visit: Payer: Medicaid Other | Admitting: Pulmonary Disease

## 2019-12-23 NOTE — Progress Notes (Signed)
Please have patient schedule a follow up with me for BP check/med adjustments as it has been elevated recently. Thank you.

## 2019-12-27 ENCOUNTER — Telehealth: Payer: Self-pay | Admitting: *Deleted

## 2019-12-27 NOTE — Telephone Encounter (Signed)
LMOVM for pt to call back and scheduled an appt.Charlsey Moragne Bruna Potter, CMA

## 2019-12-27 NOTE — Telephone Encounter (Signed)
-----   Message from Joana Reamer, [place] sent at 12/23/2019  3:41 PM EST -----   ----- Message ----- From: Judyann Munson, MD Sent: 12/06/2019   3:43 AM EST To: Joana Reamer, DO

## 2020-01-10 ENCOUNTER — Other Ambulatory Visit: Payer: Self-pay | Admitting: Family Medicine

## 2020-01-10 ENCOUNTER — Other Ambulatory Visit: Payer: Self-pay | Admitting: Internal Medicine

## 2020-01-10 DIAGNOSIS — I5032 Chronic diastolic (congestive) heart failure: Secondary | ICD-10-CM

## 2020-01-15 DIAGNOSIS — B20 Human immunodeficiency virus [HIV] disease: Secondary | ICD-10-CM | POA: Diagnosis not present

## 2020-01-15 DIAGNOSIS — R0902 Hypoxemia: Secondary | ICD-10-CM | POA: Diagnosis not present

## 2020-01-15 DIAGNOSIS — G4733 Obstructive sleep apnea (adult) (pediatric): Secondary | ICD-10-CM | POA: Diagnosis not present

## 2020-01-15 DIAGNOSIS — R062 Wheezing: Secondary | ICD-10-CM | POA: Diagnosis not present

## 2020-01-26 ENCOUNTER — Ambulatory Visit: Payer: Medicaid Other | Admitting: Family Medicine

## 2020-01-31 ENCOUNTER — Encounter: Payer: Self-pay | Admitting: Family Medicine

## 2020-01-31 NOTE — Progress Notes (Signed)
Subjective:   Patient ID: Leslie Gates    DOB: 1966-05-12, 54 y.o. female   MRN: [id number]  Leslie Gates is a 54 y.o. female with a history of  here for follow up.  Diabetes: Last three A1C's below. Was increased to Metformin 1000mg  mg QD. Endorses compliance. Endorses occasional abdominal pain. She notes she takes both the pills in the monring. Denies any hypoglycemia. Denies any polyuria, polydipsia, polyphagia. Due for diabetic eye exam. She notes her diet is improved: shes avoid rice, sodas. Drinking more water. She has lost 4lbs since Jan 2021.  She has cut back on juices, but she is working on her juice consumption and working on increasing her vegetable consumption. She is very motivated to do continue to modify her diet.  She notes that she is walking on the treadmill daily.  Lab Results  Component Value Date   HGBA1C 7.0 02/01/2020   HGBA1C 7.0 12/15/2019   HGBA1C 6.6 (H) 05/01/2019    HTN:  BP: 130/70 today. Currently on Amlodipine 10mg  QD. History of angioedema on ACE-I. Endorses compliance. Denies any chest pain, SOB, vision changes, or headaches.    HLD: Last lipid panel below. Currently on Lipitor 20mg  QD. Endorses compliance. Denies any muscles aches or weakness.  Lab Results  Component Value Date   CHOL 164 10/06/2018   HDL 56 10/06/2018   LDLCALC 89 10/06/2018   LDLDIRECT 122 (H) 04/29/2018   TRIG 99 10/06/2018   CHOLHDL 2.9 10/06/2018   Health Maintenance: Due for colon cancer screen and diabetic eye exam.  Review of Systems:  Per HPI.   Objective:   BP 130/70   Pulse 66   Wt 221 lb 3.2 oz (100.3 kg)   LMP 01/29/2020 (Approximate)   SpO2 95%   BMI 39.18 kg/m  Vitals and nursing note reviewed.  General: Pleasant older female, sitting comfortably in exam chair, well nourished, well developed, in no acute distress with non-toxic appearance Resp: Breathing comfortably on room air, speaking in full sentences MSK:  gait normal Neuro: Alert and  oriented, speech normal  Assessment & Plan:   Controlled type 2 diabetes mellitus with complication, without long-term current use of insulin (HCC) A1c is stable at 7.0.  She is currently taking Metformin 1000 mg QD, however and does endorse some abdominal discomfort at this dose.  Discussed breaking up medication into twice daily dosage.  She was amendable to this.  Congratulated patient on losing 4 pounds since January 2021.  She is very motivated on lifestyle changes.  She is due for diabetic eye exam.  She plans on making an appointment to have this done.  -Continue Metformin 500 mg twice daily.  Recommend taking with meals to help with GI side effects. - Follow-up CMP - Continue lifestyle modifications including exercise and diet - Patient to schedule diabetic eye exam and bring results with her at follow-up visit - Follow-up in 3 months - Can consider addition of an SGLT2 if A1c continues to increase  Hypertension associated with type 2 diabetes mellitus (HCC) Blood pressure at goal today.  Currently on amlodipine 10 mg daily.  Has history of angioedema on ACE inhibitor.  Patient noted to have microalbuminuria with Albumin:Cr ratio of 30 at last check.  Given her allergy history, unable to start ACE/ARB. Currently on statin which could be increased for better CV protection and LDL control.  - continue to monitor  - CMP obtained to monitor kidney function   Hyperlipidemia Lipid panel obtained  today. Will consider increasing Lipitor to 40mg  QD  If indicated however will caution with HIV meds due to risk of interactions.  Health Maintenance: Cologuard ordered. Patient provided instructions for use.  Patient to schedule diabetic eye exam at earliest convenience  Orders Placed This Encounter  Procedures  . Lipid panel  . Comprehensive metabolic panel  . Cologuard  . POCT glycosylated hemoglobin (Hb A1C)   No orders of the defined types were placed in this encounter.  Mina Marble, DO PGY-2, [facility] 02/01/2020 1:40 PM

## 2020-02-01 ENCOUNTER — Other Ambulatory Visit: Payer: Self-pay

## 2020-02-01 ENCOUNTER — Encounter: Payer: Self-pay | Admitting: Family Medicine

## 2020-02-01 ENCOUNTER — Ambulatory Visit (INDEPENDENT_AMBULATORY_CARE_PROVIDER_SITE_OTHER): Payer: Medicaid Other | Admitting: Family Medicine

## 2020-02-01 VITALS — BP 130/70 | HR 66 | Wt 221.2 lb

## 2020-02-01 DIAGNOSIS — E118 Type 2 diabetes mellitus with unspecified complications: Secondary | ICD-10-CM

## 2020-02-01 DIAGNOSIS — E1159 Type 2 diabetes mellitus with other circulatory complications: Secondary | ICD-10-CM

## 2020-02-01 DIAGNOSIS — E782 Mixed hyperlipidemia: Secondary | ICD-10-CM

## 2020-02-01 DIAGNOSIS — Z1211 Encounter for screening for malignant neoplasm of colon: Secondary | ICD-10-CM | POA: Diagnosis not present

## 2020-02-01 DIAGNOSIS — I10 Essential (primary) hypertension: Secondary | ICD-10-CM

## 2020-02-01 LAB — POCT GLYCOSYLATED HEMOGLOBIN (HGB A1C): HbA1c, POC (controlled diabetic range): 7 % (ref 0.0–7.0)

## 2020-02-01 NOTE — Patient Instructions (Addendum)
Thank you for coming to see me today. It was a pleasure to see you.   Please schedule an eye appointment at your earliest convenience for your diabetic eye exam.   Try taking your Metformin twice a day: 1 pill in the morning and 1 pill at night. Try to make sure to take it when you eat to help with your upset stomach.  Please complete the Cologard and send back as instructed.   I have collected some blood work. I will call you with the results.   If you have any questions or concerns, please do not hesitate to call the office at [phone].  Take Care,   Dr. Orpah Cobb, DO Resident Physician [facility] [phone]

## 2020-02-01 NOTE — Assessment & Plan Note (Addendum)
Blood pressure at goal today.  Currently on amlodipine 10 mg daily.  Has history of angioedema on ACE inhibitor.  Patient noted to have microalbuminuria with Albumin:Cr ratio of 30 at last check.  Given her allergy history, unable to start ACE/ARB. Currently on statin which could be increased for better CV protection and LDL control.  - continue to monitor  - CMP obtained to monitor kidney function

## 2020-02-01 NOTE — Assessment & Plan Note (Signed)
A1c is stable at 7.0.  She is currently taking Metformin 1000 mg QD, however and does endorse some abdominal discomfort at this dose.  Discussed breaking up medication into twice daily dosage.  She was amendable to this.  Congratulated patient on losing 4 pounds since January 2021.  She is very motivated on lifestyle changes.  She is due for diabetic eye exam.  She plans on making an appointment to have this done.  -Continue Metformin 500 mg twice daily.  Recommend taking with meals to help with GI side effects. - Follow-up CMP - Continue lifestyle modifications including exercise and diet - Patient to schedule diabetic eye exam and bring results with her at follow-up visit - Follow-up in 3 months - Can consider addition of an SGLT2 if A1c continues to increase

## 2020-02-01 NOTE — Assessment & Plan Note (Signed)
Lipid panel obtained today. Will consider increasing Lipitor to 40mg  QD  If indicated however will caution with HIV meds due to risk of interactions.

## 2020-02-02 ENCOUNTER — Telehealth: Payer: Self-pay | Admitting: Family Medicine

## 2020-02-02 LAB — COMPREHENSIVE METABOLIC PANEL
ALT: 28 IU/L (ref 0–32)
AST: 31 IU/L (ref 0–40)
Albumin/Globulin Ratio: 1.5 (ref 1.2–2.2)
Albumin: 4.7 g/dL (ref 3.8–4.9)
Alkaline Phosphatase: 97 IU/L (ref 39–117)
BUN/Creatinine Ratio: 14 (ref 9–23)
BUN: 11 mg/dL (ref 6–24)
Bilirubin Total: 0.3 mg/dL (ref 0.0–1.2)
CO2: 27 mmol/L (ref 20–29)
Calcium: 10.2 mg/dL (ref 8.7–10.2)
Chloride: 98 mmol/L (ref 96–106)
Creatinine, Ser: 0.8 mg/dL (ref 0.57–1.00)
GFR calc Af Amer: 98 mL/min/{1.73_m2} (ref >59)
GFR calc non Af Amer: 85 mL/min/{1.73_m2} (ref >59)
Globulin, Total: 3.2 g/dL (ref 1.5–4.5)
Glucose: 131 mg/dL — ABNORMAL HIGH (ref 65–99)
Potassium: 4.7 mmol/L (ref 3.5–5.2)
Sodium: 140 mmol/L (ref 134–144)
Total Protein: 7.9 g/dL (ref 6.0–8.5)

## 2020-02-02 LAB — LIPID PANEL
Chol/HDL Ratio: 2.7 ratio (ref 0.0–4.4)
Cholesterol, Total: 176 mg/dL (ref 100–199)
HDL: 66 mg/dL (ref >39)
LDL Chol Calc (NIH): 91 mg/dL (ref 0–99)
Triglycerides: 104 mg/dL (ref 0–149)
VLDL Cholesterol Cal: 19 mg/dL (ref 5–40)

## 2020-02-02 NOTE — Telephone Encounter (Signed)
Was informed that patient already has an active order for color guard, this if patient needs a new kit she will need to call there [phone] to have them resend her a new kit.  Provided patient with information and she agreed to call at her convenience.

## 2020-02-05 ENCOUNTER — Other Ambulatory Visit: Payer: Medicaid Other

## 2020-02-05 ENCOUNTER — Other Ambulatory Visit: Payer: Self-pay

## 2020-02-05 ENCOUNTER — Other Ambulatory Visit: Payer: Self-pay | Admitting: *Deleted

## 2020-02-05 DIAGNOSIS — B20 Human immunodeficiency virus [HIV] disease: Secondary | ICD-10-CM

## 2020-02-06 LAB — T-HELPER CELL (CD4) - (RCID CLINIC ONLY)
CD4 % Helper T Cell: 31 % — ABNORMAL LOW (ref 33–65)
CD4 T Cell Abs: 909 /uL (ref 400–1790)

## 2020-02-07 LAB — HIV-1 RNA QUANT-NO REFLEX-BLD
HIV 1 RNA Quant: <20 copies/mL
HIV-1 RNA Quant, Log: <1.3 Log copies/mL

## 2020-02-15 DIAGNOSIS — R062 Wheezing: Secondary | ICD-10-CM | POA: Diagnosis not present

## 2020-02-15 DIAGNOSIS — G4733 Obstructive sleep apnea (adult) (pediatric): Secondary | ICD-10-CM | POA: Diagnosis not present

## 2020-02-15 DIAGNOSIS — R0902 Hypoxemia: Secondary | ICD-10-CM | POA: Diagnosis not present

## 2020-02-15 DIAGNOSIS — B20 Human immunodeficiency virus [HIV] disease: Secondary | ICD-10-CM | POA: Diagnosis not present

## 2020-02-23 ENCOUNTER — Telehealth: Payer: Self-pay

## 2020-02-23 NOTE — Telephone Encounter (Signed)
COVID-19 Pre-Screening Questions:02/23/20  Do you currently have a fever (>100 F), chills or unexplained body aches?NO   Are you currently experiencing new cough, shortness of breath, sore throat, runny nose?NO  .  Have you recently travelled outside the state of [place] in the last 14 days? NO  .  Have you been in contact with someone that is currently pending confirmation of Covid19 testing or has been confirmed to have the Covid19 virus? NO  **If the patient answers NO to ALL questions -  advise the patient to please call the clinic before coming to the office should any symptoms develop.     

## 2020-02-26 ENCOUNTER — Encounter: Payer: Self-pay | Admitting: Internal Medicine

## 2020-02-26 ENCOUNTER — Ambulatory Visit (INDEPENDENT_AMBULATORY_CARE_PROVIDER_SITE_OTHER): Payer: Medicaid Other | Admitting: Internal Medicine

## 2020-02-26 ENCOUNTER — Other Ambulatory Visit: Payer: Self-pay

## 2020-02-26 VITALS — BP 118/79 | HR 80 | Temp 98.1°F | Ht 63.0 in | Wt 203.0 lb

## 2020-02-26 DIAGNOSIS — B20 Human immunodeficiency virus [HIV] disease: Secondary | ICD-10-CM

## 2020-02-26 DIAGNOSIS — Z79899 Other long term (current) drug therapy: Secondary | ICD-10-CM | POA: Diagnosis not present

## 2020-02-26 DIAGNOSIS — Z0000 Encounter for general adult medical examination without abnormal findings: Secondary | ICD-10-CM | POA: Diagnosis not present

## 2020-02-26 DIAGNOSIS — J302 Other seasonal allergic rhinitis: Secondary | ICD-10-CM

## 2020-02-26 NOTE — Progress Notes (Signed)
RFV: follow up for hiv disease  Patient ID: Leslie Gates, female   DOB: [birth year], 54 y.o.   MRN: [id number]  HPI 54yo F with hiv disease, CD 4 count of 909/VL<20 on genvoya. Not missing doses. Seasonal allergies acting up. Running eyes and runny nose mostly. She is otherwise doing well. Has been socially distant and not been with others and wears mask in public.  Outpatient Encounter Medications as of 02/26/2020  Medication Sig  . atorvastatin (LIPITOR) 20 MG tablet Take 1 tablet (20 mg total) by mouth daily.  . beclomethasone (BECONASE-AQ) 42 MCG/SPRAY nasal spray Place 1 spray into both nostrils 2 (two) times daily. Dose is for each nostril. (Patient taking differently: Place 1 spray into both nostrils 2 (two) times daily as needed for allergies or rhinitis. )  . cetirizine (ZYRTEC) 10 MG tablet TAKE ONE TABLET BY MOUTH DAILY  . furosemide (LASIX) 20 MG tablet TAKE ONE TABLET BY MOUTH EVERY DAY. MAY TAKE EXTRA TABLET EVERY DAY AS NEEDED FOR WEIGHT GAIN 3 LBS OR MORE OVERNIGHT  . GENVOYA 150-150-200-10 MG TABS tablet TAKE 1 TABLET BY MOUTH EVERY DAY WITH BREAKFAST  . metFORMIN (GLUCOPHAGE-XR) 500 MG 24 hr tablet Take 2 tablets (1,000 mg total) by mouth daily with breakfast. In 1 month, increase to 4 tablets (2000mg  total) daily.  . Multiple Vitamins-Minerals (TAB-A-VITE) TABS TAKE ONE TABLET BY MOUTH DAILY  . NAPHCON-A 0.025-0.3 % ophthalmic solution INSTILL 1 DROP INTO BOTH EYES FOUR TIMES A DAY AS NEEDED FOR IRRITATION  . omeprazole (PRILOSEC) 40 MG capsule TAKE ONE CAPSULE BY MOUTH DAILY  . OXYGEN Inhale 2 L into the lungs at bedtime.  Marland Kitchen PROAIR HFA 108 (90 Base) MCG/ACT inhaler INHALE 1 OR 2 PUFFS INTO THE LUNGS EVERY 6 HOURS AS NEEDED FOR WHEEZING OR SHORTNESS OF BREATH  . spironolactone (ALDACTONE) 25 MG tablet Take 0.5 tablets (12.5 mg total) by mouth at bedtime.  . SYMBICORT 160-4.5 MCG/ACT inhaler INHALE 2 PUFFS INTO THE LUNGS TWO TIMES DAILY  . amLODipine (NORVASC) 10 MG  tablet Take 1 tablet (10 mg total) by mouth daily. (Patient not taking: Reported on 02/26/2020)  . hydrocortisone 1 % ointment APPLY TO AFFECTED AREA 2 TIMES A DAY (Patient not taking: Reported on 02/26/2020)  . montelukast (SINGULAIR) 10 MG tablet TAKE ONE TABLET BY MOUTH EVERY NIGHT AT BEDTIME.**APPOINTMENT NEEDED FOR FURTHER REFILLS** (Patient not taking: Reported on 02/26/2020)  . [DISCONTINUED] naphazoline-pheniramine (NAPHCON-A) 0.025-0.3 % ophthalmic solution INSTILL 1 DROP INTO BOTH EYES FOUR TIMES A DAY AS NEEDED FOR IRRITATION   No facility-administered encounter medications on file as of 02/26/2020.     Patient Active Problem List   Diagnosis Date Noted  . Controlled type 2 diabetes mellitus with complication, without long-term current use of insulin ([place]) 12/15/2019  . O2 dependent 07/22/2018  . Hyperlipidemia 04/29/2018  . Chronic diastolic heart failure ([place]) 06/25/2017  . Moderate dysplasia of cervix 07/10/2013  . History of angioedema 09/09/2011  . Obesity hypoventilation syndrome ([place]) 07/10/2010  . Morbid obesity ([place]) 07/01/2010  . Obstructive sleep apnea 06/27/2010  . Pulmonary hypertension ([place]) 05/28/2010  . Hypertension associated with type 2 diabetes mellitus ([place]) 03/22/2007  . HIV disease ([place]) 06/21/2002     Health Maintenance Due  Topic Date Due  . OPHTHALMOLOGY EXAM  Never done  . Fecal DNA (Cologuard)  Never done     Review of Systems  Constitutional: Negative for fever, chills, diaphoresis, activity change, appetite change, fatigue and unexpected weight change.  HENT:  Negative for congestion, sore throat, rhinorrhea, sneezing, trouble swallowing and sinus pressure.  Eyes: Negative for photophobia and visual disturbance.  Respiratory: Negative for cough, chest tightness, shortness of breath, wheezing and stridor.  Cardiovascular: Negative for chest pain, palpitations and leg swelling.  Gastrointestinal: Negative for nausea, vomiting, abdominal pain,  diarrhea, constipation, blood in stool, abdominal distention and anal bleeding.  Genitourinary: Negative for dysuria, hematuria, flank pain and difficulty urinating.  Musculoskeletal: Negative for myalgias, back pain, joint swelling, arthralgias and gait problem.  Skin: Negative for color change, pallor, rash and wound.  Neurological: Negative for dizziness, tremors, weakness and light-headedness.  Hematological: Negative for adenopathy. Does not bruise/bleed easily.  Psychiatric/Behavioral: Negative for behavioral problems, confusion, sleep disturbance, dysphoric mood, decreased concentration and agitation.    Physical Exam   Pulse 80   Ht 5\' 3"  (1.6 m)   Wt 203 lb (92.1 kg)   LMP 02/20/2020   SpO2 99%   BMI 35.96 kg/m    Constitutional:  oriented to person, place, and time. appears well-developed and well-nourished. No distress.  HENT: [place]/AT, PERRLA, no scleral icterus Mouth/Throat: Oropharynx is clear and moist. No oropharyngeal exudate.  Cardiovascular: Normal rate, regular rhythm and normal heart sounds. Exam reveals no gallop and no friction rub.  No murmur heard.  Pulmonary/Chest: Effort normal and breath sounds normal. No respiratory distress.  has no wheezes.  Neck = supple, no nuchal rigidity Abdominal: Soft. Bowel sounds are normal.  exhibits no distension. There is no tenderness.  Lymphadenopathy: no cervical adenopathy. No axillary adenopathy Neurological: alert and oriented to person, place, and time.  Skin: Skin is warm and dry. No rash noted. No erythema.  Psychiatric: a normal mood and affect.  behavior is normal.   Lab Results  Component Value Date   CD4TCELL 31 (L) 02/05/2020   Lab Results  Component Value Date   CD4TABS 909 02/05/2020   CD4TABS 501 11/07/2019   CD4TABS 739 04/12/2019   Lab Results  Component Value Date   HIV1RNAQUANT <20 NOT DETECTED 02/05/2020   Lab Results  Component Value Date   HEPBSAB NEG 02/08/2009   Lab Results  Component  Value Date   LABRPR Non Reactive 07/25/2019    CBC Lab Results  Component Value Date   WBC 4.6 11/07/2019   RBC 4.30 11/07/2019   HGB 12.8 11/07/2019   HCT 38.6 11/07/2019   PLT 295 11/07/2019   MCV 89.8 11/07/2019   MCH 29.8 11/07/2019   MCHC 33.2 11/07/2019   RDW 12.5 11/07/2019   LYMPHSABS 1,909 11/07/2019   MONOABS 517 03/30/2017   EOSABS 170 11/07/2019    BMET Lab Results  Component Value Date   NA 140 02/01/2020   K 4.7 02/01/2020   CL 98 02/01/2020   CO2 27 02/01/2020   GLUCOSE 131 (H) 02/01/2020   BUN 11 02/01/2020   CREATININE 0.80 02/01/2020   CALCIUM 10.2 02/01/2020   GFRNONAA 85 02/01/2020   GFRAA 98 02/01/2020      Assessment and Plan hiv disease = Doing well overall will genvoya. Wants to continue taking it. Brought up 02/03/2020 of biktarvy, not interested in changing at this time.  Long term medication management = cr stable  Seasonal allergies = can take claritin or zyrtec to help  Health miaintenance =Made appt for covid vaccine for colisseum  Primary care doctor recommended to do stool screening for colon ca  Eye doctor

## 2020-03-16 DIAGNOSIS — G4733 Obstructive sleep apnea (adult) (pediatric): Secondary | ICD-10-CM | POA: Diagnosis not present

## 2020-03-16 DIAGNOSIS — R062 Wheezing: Secondary | ICD-10-CM | POA: Diagnosis not present

## 2020-03-16 DIAGNOSIS — R0902 Hypoxemia: Secondary | ICD-10-CM | POA: Diagnosis not present

## 2020-03-16 DIAGNOSIS — B20 Human immunodeficiency virus [HIV] disease: Secondary | ICD-10-CM | POA: Diagnosis not present

## 2020-03-22 ENCOUNTER — Telehealth: Payer: Self-pay

## 2020-03-22 NOTE — Telephone Encounter (Signed)
LVM for patient to call office.  .Darric Plante R Amorah [place], CMA  

## 2020-03-22 NOTE — Telephone Encounter (Signed)
-----   Message from Jennette Bill, CMA sent at 03/22/2020  9:56 AM EDT ----- Regarding: Cologuard Cologuard due

## 2020-04-04 DIAGNOSIS — Z03818 Encounter for observation for suspected exposure to other biological agents ruled out: Secondary | ICD-10-CM | POA: Diagnosis not present

## 2020-04-14 DIAGNOSIS — H1013 Acute atopic conjunctivitis, bilateral: Secondary | ICD-10-CM | POA: Diagnosis not present

## 2020-04-16 DIAGNOSIS — G4733 Obstructive sleep apnea (adult) (pediatric): Secondary | ICD-10-CM | POA: Diagnosis not present

## 2020-04-16 DIAGNOSIS — R062 Wheezing: Secondary | ICD-10-CM | POA: Diagnosis not present

## 2020-04-16 DIAGNOSIS — R0902 Hypoxemia: Secondary | ICD-10-CM | POA: Diagnosis not present

## 2020-04-16 DIAGNOSIS — B20 Human immunodeficiency virus [HIV] disease: Secondary | ICD-10-CM | POA: Diagnosis not present

## 2020-04-19 DIAGNOSIS — Z03818 Encounter for observation for suspected exposure to other biological agents ruled out: Secondary | ICD-10-CM | POA: Diagnosis not present

## 2020-05-03 DIAGNOSIS — Z03818 Encounter for observation for suspected exposure to other biological agents ruled out: Secondary | ICD-10-CM | POA: Diagnosis not present

## 2020-05-13 ENCOUNTER — Other Ambulatory Visit: Payer: Self-pay | Admitting: Family Medicine

## 2020-05-13 DIAGNOSIS — E782 Mixed hyperlipidemia: Secondary | ICD-10-CM

## 2020-05-16 DIAGNOSIS — G4733 Obstructive sleep apnea (adult) (pediatric): Secondary | ICD-10-CM | POA: Diagnosis not present

## 2020-05-16 DIAGNOSIS — R062 Wheezing: Secondary | ICD-10-CM | POA: Diagnosis not present

## 2020-05-16 DIAGNOSIS — B20 Human immunodeficiency virus [HIV] disease: Secondary | ICD-10-CM | POA: Diagnosis not present

## 2020-05-16 DIAGNOSIS — R0902 Hypoxemia: Secondary | ICD-10-CM | POA: Diagnosis not present

## 2020-05-17 DIAGNOSIS — Z03818 Encounter for observation for suspected exposure to other biological agents ruled out: Secondary | ICD-10-CM | POA: Diagnosis not present

## 2020-06-01 ENCOUNTER — Other Ambulatory Visit: Payer: Self-pay | Admitting: Internal Medicine

## 2020-06-01 DIAGNOSIS — B20 Human immunodeficiency virus [HIV] disease: Secondary | ICD-10-CM

## 2020-06-03 ENCOUNTER — Other Ambulatory Visit: Payer: Self-pay | Admitting: *Deleted

## 2020-06-03 DIAGNOSIS — B20 Human immunodeficiency virus [HIV] disease: Secondary | ICD-10-CM

## 2020-06-03 DIAGNOSIS — Z113 Encounter for screening for infections with a predominantly sexual mode of transmission: Secondary | ICD-10-CM

## 2020-06-16 DIAGNOSIS — R062 Wheezing: Secondary | ICD-10-CM | POA: Diagnosis not present

## 2020-06-16 DIAGNOSIS — G4733 Obstructive sleep apnea (adult) (pediatric): Secondary | ICD-10-CM | POA: Diagnosis not present

## 2020-06-16 DIAGNOSIS — B20 Human immunodeficiency virus [HIV] disease: Secondary | ICD-10-CM | POA: Diagnosis not present

## 2020-06-16 DIAGNOSIS — R0902 Hypoxemia: Secondary | ICD-10-CM | POA: Diagnosis not present

## 2020-07-11 DIAGNOSIS — G4733 Obstructive sleep apnea (adult) (pediatric): Secondary | ICD-10-CM | POA: Diagnosis not present

## 2020-07-17 DIAGNOSIS — R062 Wheezing: Secondary | ICD-10-CM | POA: Diagnosis not present

## 2020-07-17 DIAGNOSIS — B20 Human immunodeficiency virus [HIV] disease: Secondary | ICD-10-CM | POA: Diagnosis not present

## 2020-07-17 DIAGNOSIS — R0902 Hypoxemia: Secondary | ICD-10-CM | POA: Diagnosis not present

## 2020-07-17 DIAGNOSIS — G4733 Obstructive sleep apnea (adult) (pediatric): Secondary | ICD-10-CM | POA: Diagnosis not present

## 2020-08-02 ENCOUNTER — Ambulatory Visit: Payer: Medicaid Other

## 2020-08-13 ENCOUNTER — Other Ambulatory Visit: Payer: Medicaid Other

## 2020-08-14 DIAGNOSIS — J351 Hypertrophy of tonsils: Secondary | ICD-10-CM | POA: Diagnosis not present

## 2020-08-14 DIAGNOSIS — H903 Sensorineural hearing loss, bilateral: Secondary | ICD-10-CM | POA: Diagnosis not present

## 2020-08-14 DIAGNOSIS — H6123 Impacted cerumen, bilateral: Secondary | ICD-10-CM | POA: Diagnosis not present

## 2020-08-16 DIAGNOSIS — R0902 Hypoxemia: Secondary | ICD-10-CM | POA: Diagnosis not present

## 2020-08-16 DIAGNOSIS — B20 Human immunodeficiency virus [HIV] disease: Secondary | ICD-10-CM | POA: Diagnosis not present

## 2020-08-16 DIAGNOSIS — R062 Wheezing: Secondary | ICD-10-CM | POA: Diagnosis not present

## 2020-08-16 DIAGNOSIS — G4733 Obstructive sleep apnea (adult) (pediatric): Secondary | ICD-10-CM | POA: Diagnosis not present

## 2020-08-25 ENCOUNTER — Other Ambulatory Visit: Payer: Self-pay | Admitting: Family Medicine

## 2020-08-27 ENCOUNTER — Encounter: Payer: Self-pay | Admitting: Internal Medicine

## 2020-08-27 ENCOUNTER — Other Ambulatory Visit: Payer: Self-pay

## 2020-08-27 ENCOUNTER — Ambulatory Visit (INDEPENDENT_AMBULATORY_CARE_PROVIDER_SITE_OTHER): Payer: Medicaid Other | Admitting: Internal Medicine

## 2020-08-27 VITALS — Wt 220.0 lb

## 2020-08-27 DIAGNOSIS — Z0000 Encounter for general adult medical examination without abnormal findings: Secondary | ICD-10-CM | POA: Diagnosis not present

## 2020-08-27 DIAGNOSIS — B20 Human immunodeficiency virus [HIV] disease: Secondary | ICD-10-CM | POA: Diagnosis not present

## 2020-08-27 DIAGNOSIS — Z23 Encounter for immunization: Secondary | ICD-10-CM | POA: Diagnosis not present

## 2020-08-27 DIAGNOSIS — Z79899 Other long term (current) drug therapy: Secondary | ICD-10-CM

## 2020-08-27 DIAGNOSIS — R87612 Low grade squamous intraepithelial lesion on cytologic smear of cervix (LGSIL): Secondary | ICD-10-CM

## 2020-08-27 NOTE — Progress Notes (Signed)
RFV: follow up for hiv disease  Patient ID: Leslie Gates, female   DOB: 11/01/1966, 54 y.o.   MRN: [id number]  HPI 54yo F with well controlled hiv disease, CD 4 count 909/VL<20, on genvoya. Does nebulizer and has occasional seasonal allergies. She noticing having blurry vision - she is going ophtho soon.  Hearing exam tomorrow to evaluated for new hearing aids.  Has had covid vaccine -- pfizer 1st dose [id number] on 4/21, and 2nd dose of pfizer [id number] on 5/12.  Outpatient Encounter Medications as of 08/27/2020  Medication Sig  . amLODipine (NORVASC) 10 MG tablet Take 1 tablet (10 mg total) by mouth daily. (Patient not taking: Reported on 02/26/2020)  . atorvastatin (LIPITOR) 20 MG tablet TAKE ONE TABLET BY MOUTH EVERY DAY  . beclomethasone (BECONASE-AQ) 42 MCG/SPRAY nasal spray Place 1 spray into both nostrils 2 (two) times daily. Dose is for each nostril. (Patient taking differently: Place 1 spray into both nostrils 2 (two) times daily as needed for allergies or rhinitis. )  . cetirizine (ZYRTEC) 10 MG tablet TAKE ONE TABLET BY MOUTH DAILY  . furosemide (LASIX) 20 MG tablet TAKE ONE TABLET BY MOUTH EVERY DAY. MAY TAKE EXTRA TABLET EVERY DAY AS NEEDED FOR WEIGHT GAIN 3 LBS OR MORE OVERNIGHT  . GENVOYA 150-150-200-10 MG TABS tablet TAKE 1 TABLET BY MOUTH EVERY DAY WITH BREAKFAST  . hydrocortisone 1 % ointment APPLY TO AFFECTED AREA 2 TIMES A DAY (Patient not taking: Reported on 02/26/2020)  . metFORMIN (GLUCOPHAGE-XR) 500 MG 24 hr tablet Take 2 tablets (1,000 mg total) by mouth daily with breakfast. In 1 month, increase to 4 tablets (2000mg  total) daily.  . montelukast (SINGULAIR) 10 MG tablet TAKE ONE TABLET BY MOUTH EVERY NIGHT AT BEDTIME.**APPOINTMENT NEEDED FOR FURTHER REFILLS** (Patient not taking: Reported on 02/26/2020)  . Multiple Vitamins-Minerals (TAB-A-VITE) TABS TAKE ONE TABLET BY MOUTH DAILY  . NAPHCON-A 0.025-0.3 % ophthalmic solution INSTILL 1 DROP INTO BOTH EYES FOUR TIMES A DAY  AS NEEDED FOR IRRITATION  . omeprazole (PRILOSEC) 40 MG capsule TAKE ONE CAPSULE BY MOUTH DAILY  . OXYGEN Inhale 2 L into the lungs at bedtime.  04/27/2020 PROAIR HFA 108 (90 Base) MCG/ACT inhaler INHALE 1 OR 2 PUFFS INTO THE LUNGS EVERY 6 HOURS AS NEEDED FOR WHEEZING OR SHORTNESS OF BREATH  . spironolactone (ALDACTONE) 25 MG tablet Take 0.5 tablets (12.5 mg total) by mouth at bedtime.  . SYMBICORT 160-4.5 MCG/ACT inhaler INHALE 2 PUFFS INTO THE LUNGS TWO TIMES DAILY  . [DISCONTINUED] naphazoline-pheniramine (NAPHCON-A) 0.025-0.3 % ophthalmic solution INSTILL 1 DROP INTO BOTH EYES FOUR TIMES A DAY AS NEEDED FOR IRRITATION   No facility-administered encounter medications on file as of 08/27/2020.     Patient Active Problem List   Diagnosis Date Noted  . Controlled type 2 diabetes mellitus with complication, without long-term current use of insulin (HCC) 12/15/2019  . O2 dependent 07/22/2018  . Hyperlipidemia 04/29/2018  . Chronic diastolic heart failure (HCC) 06/25/2017  . Moderate dysplasia of cervix 07/10/2013  . History of angioedema 09/09/2011  . Obesity hypoventilation syndrome (HCC) 07/10/2010  . Morbid obesity (HCC) 07/01/2010  . Obstructive sleep apnea 06/27/2010  . Pulmonary hypertension (HCC) 05/28/2010  . Hypertension associated with type 2 diabetes mellitus (HCC) 03/22/2007  . HIV disease (HCC) 06/21/2002     Health Maintenance Due  Topic Date Due  . FOOT EXAM  Never done  . OPHTHALMOLOGY EXAM  Never done  . COVID-19 Vaccine (1) Never done  . Fecal DNA (  Cologuard)  Never done  . INFLUENZA VACCINE  06/16/2020  . PAP SMEAR-Modifier  07/24/2020  . HEMOGLOBIN A1C  08/03/2020     Review of Systems  Constitutional: Negative for fever, chills, diaphoresis, activity change, appetite change, fatigue and unexpected weight change.  HENT: Negative for congestion, sore throat, rhinorrhea, sneezing, trouble swallowing and sinus pressure.  Eyes: Negative for photophobia and visual  disturbance.  Respiratory: Negative for cough, chest tightness, shortness of breath, wheezing and stridor.  Cardiovascular: Negative for chest pain, palpitations and leg swelling.  Gastrointestinal: Negative for nausea, vomiting, abdominal pain, diarrhea, constipation, blood in stool, abdominal distention and anal bleeding.  Genitourinary: Negative for dysuria, hematuria, flank pain and difficulty urinating.  Musculoskeletal: Negative for myalgias, back pain, joint swelling, arthralgias and gait problem.  Skin: Negative for color change, pallor, rash and wound.  Neurological: Negative for dizziness, tremors, weakness and light-headedness.  Hematological: Negative for adenopathy. Does not bruise/bleed easily.  Psychiatric/Behavioral: Negative for behavioral problems, confusion, sleep disturbance, dysphoric mood, decreased concentration and agitation.    Physical Exam   Wt 220 lb (99.8 kg)   LMP 07/22/2020   BMI 38.97 kg/m   Physical Exam  Constitutional:  oriented to person, place, and time. appears well-developed and well-nourished. No distress.  HENT: [place]/AT, PERRLA, no scleral icterus Mouth/Throat: Oropharynx is clear and moist. No oropharyngeal exudate.  Cardiovascular: Normal rate, regular rhythm and normal heart sounds. Exam reveals no gallop and no friction rub.  No murmur heard.  Pulmonary/Chest: Effort normal and breath sounds normal. No respiratory distress.  has no wheezes.  Neck = supple, no nuchal rigidity Abdominal: Soft. Bowel sounds are normal.  exhibits no distension. There is no tenderness.  Lymphadenopathy: no cervical adenopathy. No axillary adenopathy Neurological: alert and oriented to person, place, and time.  Skin: Skin is warm and dry. No rash noted. No erythema.  Psychiatric: a normal mood and affect.  behavior is normal.   Lab Results  Component Value Date   CD4TCELL 31 (L) 02/05/2020   Lab Results  Component Value Date   CD4TABS 909 02/05/2020    CD4TABS 501 11/07/2019   CD4TABS 739 04/12/2019   Lab Results  Component Value Date   HIV1RNAQUANT <20 NOT DETECTED 02/05/2020   Lab Results  Component Value Date   HEPBSAB NEG 02/08/2009   Lab Results  Component Value Date   LABRPR Non Reactive 07/25/2019    CBC Lab Results  Component Value Date   WBC 4.6 11/07/2019   RBC 4.30 11/07/2019   HGB 12.8 11/07/2019   HCT 38.6 11/07/2019   PLT 295 11/07/2019   MCV 89.8 11/07/2019   MCH 29.8 11/07/2019   MCHC 33.2 11/07/2019   RDW 12.5 11/07/2019   LYMPHSABS 1,909 11/07/2019   MONOABS 517 03/30/2017   EOSABS 170 11/07/2019    BMET Lab Results  Component Value Date   NA 140 02/01/2020   K 4.7 02/01/2020   CL 98 02/01/2020   CO2 27 02/01/2020   GLUCOSE 131 (H) 02/01/2020   BUN 11 02/01/2020   CREATININE 0.80 02/01/2020   CALCIUM 10.2 02/01/2020   GFRNONAA 85 02/01/2020   GFRAA 98 02/01/2020      Assessment and Plan  hiv disease = will check labs today. 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 //mammo not due until feb 2022  Health maintenance = will give flu shot  today; has pfizer vaccine booster next month

## 2020-08-28 ENCOUNTER — Ambulatory Visit: Payer: Medicaid Other | Admitting: Internal Medicine

## 2020-08-28 DIAGNOSIS — H906 Mixed conductive and sensorineural hearing loss, bilateral: Secondary | ICD-10-CM | POA: Diagnosis not present

## 2020-08-28 LAB — T-HELPER CELL (CD4) - (RCID CLINIC ONLY)
CD4 % Helper T Cell: 35 % (ref 33–65)
CD4 T Cell Abs: 652 /uL (ref 400–1790)

## 2020-08-29 LAB — CBC WITH DIFFERENTIAL/PLATELET
Absolute Monocytes: 431 cells/uL (ref 200–950)
Basophils Absolute: 59 cells/uL (ref 0–200)
Basophils Relative: 1.2 %
Eosinophils Absolute: 172 cells/uL (ref 15–500)
Eosinophils Relative: 3.5 %
HCT: 38.2 % (ref 35.0–45.0)
Hemoglobin: 12.5 g/dL (ref 11.7–15.5)
Lymphs Abs: 1911 cells/uL (ref 850–3900)
MCH: 30.2 pg (ref 27.0–33.0)
MCHC: 32.7 g/dL (ref 32.0–36.0)
MCV: 92.3 fL (ref 80.0–100.0)
MPV: 10.4 fL (ref 7.5–12.5)
Monocytes Relative: 8.8 %
Neutro Abs: 2328 cells/uL (ref 1500–7800)
Neutrophils Relative %: 47.5 %
Platelets: 318 10*3/uL (ref 140–400)
RBC: 4.14 10*6/uL (ref 3.80–5.10)
RDW: 12.4 % (ref 11.0–15.0)
Total Lymphocyte: 39 %
WBC: 4.9 10*3/uL (ref 3.8–10.8)

## 2020-08-29 LAB — COMPLETE METABOLIC PANEL WITH GFR
AG Ratio: 1.3 (calc) (ref 1.0–2.5)
ALT: 25 U/L (ref 6–29)
AST: 25 U/L (ref 10–35)
Albumin: 4.3 g/dL (ref 3.6–5.1)
Alkaline phosphatase (APISO): 75 U/L (ref 37–153)
BUN: 11 mg/dL (ref 7–25)
CO2: 31 mmol/L (ref 20–32)
Calcium: 8.9 mg/dL (ref 8.6–10.4)
Chloride: 99 mmol/L (ref 98–110)
Creat: 0.66 mg/dL (ref 0.50–1.05)
GFR, Est African American: 117 mL/min/{1.73_m2} (ref >=60)
GFR, Est Non African American: 101 mL/min/{1.73_m2} (ref >=60)
Globulin: 3.3 g/dL (calc) (ref 1.9–3.7)
Glucose, Bld: 250 mg/dL — ABNORMAL HIGH (ref 65–99)
Potassium: 3.8 mmol/L (ref 3.5–5.3)
Sodium: 137 mmol/L (ref 135–146)
Total Bilirubin: 0.3 mg/dL (ref 0.2–1.2)
Total Protein: 7.6 g/dL (ref 6.1–8.1)

## 2020-08-29 LAB — HIV-1 RNA QUANT-NO REFLEX-BLD
HIV 1 RNA Quant: <20 Copies/mL — ABNORMAL HIGH
HIV-1 RNA Quant, Log: <1.3 Log cps/mL — ABNORMAL HIGH

## 2020-08-29 LAB — RPR: RPR Ser Ql: NONREACTIVE

## 2020-09-02 ENCOUNTER — Other Ambulatory Visit: Payer: Self-pay

## 2020-09-02 ENCOUNTER — Ambulatory Visit (INDEPENDENT_AMBULATORY_CARE_PROVIDER_SITE_OTHER): Payer: Medicaid Other | Admitting: Family Medicine

## 2020-09-02 VITALS — BP 124/75 | HR 76 | Ht 63.0 in | Wt 225.8 lb

## 2020-09-02 DIAGNOSIS — I5032 Chronic diastolic (congestive) heart failure: Secondary | ICD-10-CM | POA: Diagnosis not present

## 2020-09-02 DIAGNOSIS — E119 Type 2 diabetes mellitus without complications: Secondary | ICD-10-CM | POA: Diagnosis not present

## 2020-09-02 DIAGNOSIS — E782 Mixed hyperlipidemia: Secondary | ICD-10-CM

## 2020-09-02 DIAGNOSIS — I152 Hypertension secondary to endocrine disorders: Secondary | ICD-10-CM

## 2020-09-02 DIAGNOSIS — E1159 Type 2 diabetes mellitus with other circulatory complications: Secondary | ICD-10-CM | POA: Diagnosis not present

## 2020-09-02 DIAGNOSIS — E118 Type 2 diabetes mellitus with unspecified complications: Secondary | ICD-10-CM | POA: Diagnosis present

## 2020-09-02 LAB — POCT GLYCOSYLATED HEMOGLOBIN (HGB A1C): HbA1c, POC (controlled diabetic range): 7.6 % — AB (ref 0.0–7.0)

## 2020-09-02 MED ORDER — ATORVASTATIN CALCIUM 40 MG PO TABS: 40.0000 mg | ORAL_TABLET | Freq: Every day | ORAL | 3 refills | Status: DC

## 2020-09-02 MED ORDER — EMPAGLIFLOZIN 25 MG PO TABS: 25.0000 mg | ORAL_TABLET | Freq: Every day | ORAL | 0 refills | Status: DC

## 2020-09-02 NOTE — Assessment & Plan Note (Addendum)
Unclear if true diagnosis of CHF given normal echo. Mild LE edema currently taking Lasix. Has endorsed orthopnea in the past per chart review. Lungs clear on exam today and currently asymptomatic. Unclear if would actually benefit from compression stockings rather than lasix. Wonder if amlodipine is contributing to LE swelling. - will plan to further discuss at follow up visit - would benefit from trial off lasix and trial of compression stockings for LE edema, consider switching to alternative BP med  - may benefit with follow up with cardiology (last visit in 2018)

## 2020-09-02 NOTE — Assessment & Plan Note (Signed)
Given worsening diabetes and poor eating habits, will increase statin to moderate-high intensity. Plan to repeat direct LDL at follow up in 3 months. - increase Lipitor to 40mg  QD

## 2020-09-02 NOTE — Assessment & Plan Note (Signed)
Well controlled on Amlodipine and Spironolactone. Non-smoker. - can consider transition from Amlodipine to alternative agent given chronic LE swelling

## 2020-09-02 NOTE — Progress Notes (Signed)
Subjective:   Patient ID: Leslie Gates    DOB: 27-Oct-1966, 54 y.o. female   MRN: [id number]  Leslie Gates is a 54 y.o. female with a history of HIV (follows with ID), HTN, HLD, T2DM, morbid obesity, OHS/OSA on CPAP qHS  here for follow up.  Diabetes: Last three A1C's below. Currently on Metformin 1000mg  BID. Endorses compliance. Denies any polyuria, polydipsia, polyphagia. Due for diabetic foot exam and eye exam.  Lab Results  Component Value Date   HGBA1C 7.6 (A) 09/02/2020   HGBA1C 7.0 02/01/2020   HGBA1C 7.0 12/15/2019    HTN:  BP: 124/75 today. Currently on Amlodipine 10mg  QD and Spironolactone 12.5mg  QD. History of angioedema on ACE-I and takes Lasix for edema thus avoiding thiazides.  Endorses compliance. Non-smoker. Denies any chest pain, SOB, vision changes, or headaches.    HLD: Last lipid panel below. Currently on Lipitor 20mg  QD. Endorses compliance. Denies any muscles aches or weakness. The 10-year ASCVD risk score 12/17/2019 DC Jr., et al., 2013) is: 6.5%   Lab Results  Component Value Date   CHOL 176 02/01/2020   HDL 66 02/01/2020   LDLCALC 91 02/01/2020   LDLDIRECT 122 (H) 04/29/2018   TRIG 104 02/01/2020   CHOLHDL 2.7 02/01/2020   Health Maintenance: Health Maintenance Due  Topic  . OPHTHALMOLOGY EXAM   . PAP SMEAR-Modifier    Review of Systems:  Per HPI.   Objective:   BP 124/75   Pulse 76   Ht 5\' 3"  (1.6 m)   Wt 225 lb 12.8 oz (102.4 kg)   SpO2 96%   BMI 40.00 kg/m  Vitals and nursing note reviewed.  General: pleasant older lady, sitting comfortably in exam chair, well nourished, well developed, in no acute distress with non-toxic appearance CV: regular rate and rhythm without murmurs, rubs, or gallops, mild lower extremity edema, 2+ radial and pedal pulses bilaterally Lungs: clear to auscultation bilaterally with normal work of breathing on room air Resp: breathing comfortably on room air, speaking in full sentences Skin: warm,  dry Extremities: warm and well perfused, normal tone MSK: gait normal Neuro: Alert and oriented, speech normal  Diabetic Foot Exam - Simple   Simple Foot Form Visual Inspection No deformities, no ulcerations, no other skin breakdown bilaterally: Yes Sensation Testing Intact to touch and monofilament testing bilaterally: Yes Pulse Check Posterior Tibialis and Dorsalis pulse intact bilaterally: Yes Comments    Assessment & Plan:   Chronic diastolic heart failure (HCC) Unclear if true diagnosis of CHF given normal echo. Mild LE edema currently taking Lasix. Has endorsed orthopnea in the past per chart review. Lungs clear on exam today and currently asymptomatic. Unclear if would actually benefit from compression stockings rather than lasix. Wonder if amlodipine is contributing to LE swelling. - will plan to further discuss at follow up visit - would benefit from trial off lasix and trial of compression stockings for LE edema, consider switching to alternative BP med  - may benefit with follow up with cardiology (last visit in 2018)  Hypertension associated with type 2 diabetes mellitus (HCC) Well controlled on Amlodipine and Spironolactone. Non-smoker. - can consider transition from Amlodipine to alternative agent given chronic LE swelling   Type 2 diabetes mellitus without complication, without long-term current use of insulin (HCC) Worsening A1C from 7.0 to 7.6. Endorses compliance with Metformin 1000mg  BID however does endorse poor diet.  - continue Metformin - education provided on emportance of lifestyle modifications to help control diabetes and  prevent irreversible complications - start Jardiance 25mg  QD - follow up BMP in 2 weeks to monitor kidney function - discussed potential SE's to SGLT-2 and recommended adequate hydration to avoid these. Patient voiced understanding and agreement with plan - diabetic foot exam performed today and WNL - patient has diabetic eye exam  scheduled for November 2021. Patient to have results sent to [facility]  Hyperlipidemia Given worsening diabetes and poor eating habits, will increase statin to moderate-high intensity. Plan to repeat direct LDL at follow up in 3 months. - increase Lipitor to 40mg  QD  Health Maintenance: [facility]mear [facility]  Declined Cologuard at this time but is open to re-discussing at follow up visit.   Orders Placed This Encounter  Procedures  . Basic metabolic panel    Standing Status:   Future    Standing Expiration Date:   09/02/2021  . POCT glycosylated hemoglobin (Hb A1C)   Meds ordered this encounter  Medications  . atorvastatin (LIPITOR) 40 MG tablet    Sig: Take 1 tablet (40 mg total) by mouth daily.    Dispense:  90 tablet    Refill:  3  . empagliflozin (JARDIANCE) 25 MG TABS tablet    Sig: Take 1 tablet (25 mg total) by mouth daily.    Dispense:  30 tablet    Refill:  0   Clearance Coots, DO PGY-3, [facility] 09/02/2020 2:20 PM

## 2020-09-02 NOTE — Patient Instructions (Addendum)
It was a pleasure to see you today!  Thank you for choosing [facility] for your primary care.  Leslie Gates was seen for check up.   Our plans for today were:  Please start taking Jardiance 25mg : take this medicine once a day.  Be sure to drink plenty of water with this medicine  I have increased your lipitor to 40mg  daily. You can take two 20mg  tablets of your current prescription until you run out.  Please continue your metformin twice a day.  Continue taking your amlodipine for your blood pressure.   Please follow up with Dr. for your Pap-smear  Please return on 09/13/20 at 10:00am for lab visit.   To keep you healthy, please keep in mind the following health maintenance items that you are due for:   1. You are due to for your diabetic eye exam. Please be sure to see your eye doctor for your diabetic eye exam   You should return to our clinic in 3 months for diabetes follow up.   Best Wishes,   , DO

## 2020-09-02 NOTE — Assessment & Plan Note (Deleted)
Smoking Status: Non-smoker Statin: increasing to Lipitor 40mg  ACE/ARB if HTN: h/o angioedema on ACE-I Albuminuria: due 12/14/20, starting SGLT-2 Metformin: Metformin 1000mg  BID PCV 23: up to date, due when 65+

## 2020-09-02 NOTE — Assessment & Plan Note (Addendum)
Worsening A1C from 7.0 to 7.6. Endorses compliance with Metformin 1000mg  BID however does endorse poor diet.  - continue Metformin - education provided on emportance of lifestyle modifications to help control diabetes and prevent irreversible complications - start Jardiance 25mg  QD - follow up BMP in 2 weeks to monitor kidney function - discussed potential SE's to SGLT-2 and recommended adequate hydration to avoid these. Patient voiced understanding and agreement with plan - diabetic foot exam performed today and WNL - patient has diabetic eye exam scheduled for November 2021. Patient to have results sent to [place]

## 2020-09-13 ENCOUNTER — Other Ambulatory Visit: Payer: Medicaid Other

## 2020-09-16 DIAGNOSIS — R0902 Hypoxemia: Secondary | ICD-10-CM | POA: Diagnosis not present

## 2020-09-16 DIAGNOSIS — G4733 Obstructive sleep apnea (adult) (pediatric): Secondary | ICD-10-CM | POA: Diagnosis not present

## 2020-09-16 DIAGNOSIS — B20 Human immunodeficiency virus [HIV] disease: Secondary | ICD-10-CM | POA: Diagnosis not present

## 2020-09-16 DIAGNOSIS — R062 Wheezing: Secondary | ICD-10-CM | POA: Diagnosis not present

## 2020-09-24 ENCOUNTER — Telehealth: Payer: Self-pay | Admitting: Family Medicine

## 2020-09-24 NOTE — Telephone Encounter (Signed)
I connected with Leslie Gates to offer her the services of the [facility] Care Team. Patient is not interested in scheduling a phone appt at this time.

## 2020-09-29 ENCOUNTER — Other Ambulatory Visit: Payer: Self-pay | Admitting: Family Medicine

## 2020-10-03 DIAGNOSIS — E119 Type 2 diabetes mellitus without complications: Secondary | ICD-10-CM | POA: Diagnosis not present

## 2020-10-03 DIAGNOSIS — Z794 Long term (current) use of insulin: Secondary | ICD-10-CM | POA: Diagnosis not present

## 2020-10-08 ENCOUNTER — Other Ambulatory Visit: Payer: Self-pay | Admitting: Family Medicine

## 2020-10-08 DIAGNOSIS — I10 Essential (primary) hypertension: Secondary | ICD-10-CM

## 2020-10-08 DIAGNOSIS — E118 Type 2 diabetes mellitus with unspecified complications: Secondary | ICD-10-CM

## 2020-10-08 DIAGNOSIS — I5032 Chronic diastolic (congestive) heart failure: Secondary | ICD-10-CM

## 2020-10-16 DIAGNOSIS — B20 Human immunodeficiency virus [HIV] disease: Secondary | ICD-10-CM | POA: Diagnosis not present

## 2020-10-16 DIAGNOSIS — R062 Wheezing: Secondary | ICD-10-CM | POA: Diagnosis not present

## 2020-10-16 DIAGNOSIS — R0902 Hypoxemia: Secondary | ICD-10-CM | POA: Diagnosis not present

## 2020-10-16 DIAGNOSIS — G4733 Obstructive sleep apnea (adult) (pediatric): Secondary | ICD-10-CM | POA: Diagnosis not present

## 2020-11-13 ENCOUNTER — Other Ambulatory Visit (HOSPITAL_COMMUNITY)
Admission: RE | Admit: 2020-11-13 | Discharge: 2020-11-13 | Disposition: A | Discharge status: Home / Self Care | Payer: Medicaid Other | Source: Ambulatory Visit | Attending: Obstetrics & Gynecology | Admitting: Obstetrics & Gynecology

## 2020-11-13 ENCOUNTER — Encounter: Payer: Self-pay | Admitting: Obstetrics and Gynecology

## 2020-11-13 ENCOUNTER — Other Ambulatory Visit: Payer: Self-pay

## 2020-11-13 ENCOUNTER — Ambulatory Visit (INDEPENDENT_AMBULATORY_CARE_PROVIDER_SITE_OTHER): Payer: Medicaid Other | Admitting: Obstetrics and Gynecology

## 2020-11-13 VITALS — BP 117/78 | HR 81 | Ht 63.0 in | Wt 211.9 lb

## 2020-11-13 DIAGNOSIS — A630 Anogenital (venereal) warts: Secondary | ICD-10-CM | POA: Diagnosis not present

## 2020-11-13 DIAGNOSIS — N898 Other specified noninflammatory disorders of vagina: Secondary | ICD-10-CM | POA: Diagnosis not present

## 2020-11-13 DIAGNOSIS — N870 Mild cervical dysplasia: Secondary | ICD-10-CM

## 2020-11-13 DIAGNOSIS — N912 Amenorrhea, unspecified: Secondary | ICD-10-CM

## 2020-11-13 DIAGNOSIS — Z8619 Personal history of other infectious and parasitic diseases: Secondary | ICD-10-CM | POA: Insufficient documentation

## 2020-11-13 HISTORY — DX: Anogenital (venereal) warts: A63.0

## 2020-11-13 HISTORY — DX: Personal history of other infectious and parasitic diseases: Z86.19

## 2020-11-13 LAB — POCT URINE PREGNANCY: Preg Test, Ur: NEGATIVE

## 2020-11-13 MED ORDER — IMIQUIMOD 5 % EX CREA: TOPICAL_CREAM | CUTANEOUS | 3 refills | Status: DC

## 2020-11-13 MED ORDER — FLUCONAZOLE 150 MG PO TABS: 150.0000 mg | ORAL_TABLET | Freq: Once | ORAL | 1 refills | Status: DC

## 2020-11-13 NOTE — Progress Notes (Signed)
Pt presents for annual and pap. LSIL with neg HPV on 07/25/2019 - pt did not show for colposcopy last year Pt c/o vaginal discharge with itching on the inside Pt also c/o hot flashes  No menses in 4 months; UPT neg  Mammogram due 12/05/20 Cologard due 12/02/2020

## 2020-11-13 NOTE — Patient Instructions (Signed)
Genital Warts Genital warts are small growths in the area around the genitals or the anus. They are caused by a type of germ (HPV virus). This germ is spread from person to person during sex. It can be spread through vaginal, anal, and oral sex. Genital warts can lead to other problems if they are not treated. A person is more likely to have this condition if he or she:  Has sex without using a condom.  Has sex with many people.  Has sex before the age of 16.  Has a weak body defense (immune) system. This condition can be treated with medicines. Your doctor may also burn or freeze the warts. In some cases, surgery may be done to remove the warts. Follow these instructions at home: Medicines   Apply over-the-counter and prescription medicines only as told by your doctor.  Do not use medicines that are meant for treating hand warts.  Talk with your doctor about using creams to treat itching. Instructions for women  Plan to have regular tests to check for cervical cancer. Your risk for this cancer increases when you have genital warts.  If you become pregnant, tell your doctor that you have had genital warts. The germ can be passed to the baby. General instructions  Do not touch or scratch the warts.  Do not have sex until your treatment is done.  Tell your current and past sexual partners about your condition. They may need treatment.  After treatment, use condoms during sex.  Keep all follow-up visits as told by your doctor. This is important. How is this prevented? Talk with your doctor about getting the HPV shot. The HPV shot:  Can help stop some HPV infections and cancers.  Is given to males and females who are 11-26 years old.  Will not work if you already have HPV.  Is not recommended for pregnant women. Contact a doctor if:  You have redness, swelling, or pain in the area of the treated skin.  You have a fever.  You feel sick.  You feel lumps in the area  around your genitals or anus.  You have bleeding in the area around your genitals or anus.  You have pain during sex. Summary  Genital warts are small growths in the areas around the genitals or the anus. They are caused by a type of germ (HPV virus).  The germ is spread by having vaginal, anal, or oral sex without using a condom.  This condition is treated using medicines. In some cases, freezing, burning, or surgery may be done to get rid of the warts.  This condition may be prevented by getting a HPV shot. This information is not intended to replace advice given to you by your health care provider. Make sure you discuss any questions you have with your health care provider. Document Revised: 12/07/2017 Document Reviewed: 12/07/2017 Elsevier Patient Education  2020 Elsevier Inc.  Imiquimod skin cream What is this medicine? IMIQUIMOD (i mi KWI mod) cream is used to treat external genital or anal warts. It is also used to treat other skin conditions such as actinic keratosis and certain types of skin cancer. This medicine may be used for other purposes; ask your health care provider or pharmacist if you have questions. COMMON BRAND NAME(S): Aldara, Zyclara What should I tell my health care provider before I take this medicine? They need to know if you have any of these conditions:  decreased immune function  an unusual or allergic reaction   imiquimod, other medicines, foods, dyes, or preservatives  pregnant or trying to get pregnant  breast-feeding How should I use this medicine? This medicine is for external use only. Do not take by mouth. Follow the directions on the prescription label. Apply just before bedtime. Wash your hands before and after use. Apply a thin layer of cream and massage gently into the affected areas until no longer visible. Do not use in the mouth, eyes or the vagina. Use this medicine only on the affected area as directed by your health care provider. Do not  use for longer than prescribed. It is important not to use more medicine than prescribed. To do so may increase the chance of side effects. Talk to your pediatrician regarding the use of this medicine in children. While this drug may be prescribed for children as young as 65 years of age for selected conditions, precautions do apply. Overdosage: If you think you have taken too much of this medicine contact a poison control center or emergency room at once. NOTE: This medicine is only for you. Do not share this medicine with others. What if I miss a dose? If you miss a dose, use it as soon as you can. If it is almost time for your next dose, use only that dose. Do not use double or extra doses. What may interact with this medicine? Interactions are not expected. Do not use any other medicines on the treated area without asking your doctor or health care professional. This list may not describe all possible interactions. Give your health care provider a list of all the medicines, herbs, non-prescription drugs, or dietary supplements you use. Also tell them if you smoke, drink alcohol, or use illegal drugs. Some items may interact with your medicine. What should I watch for while using this medicine? Visit your health care professional for regular checks on your progress. Do not use this medicine until the skin has healed from any other drug (example: podofilox or podophyllin resin) or surgical skin treatment. Females should receive regular pelvic exams while being treated for genital warts. Most patients see improvement within 4 weeks. It may take up to 16 weeks to see a full clearing of the warts. This medicine is not a cure. New warts may develop during or after treatment. Avoid sexual (genital, anal, oral) contact while the cream is on the skin. If warts are visible in the genital area, sexual contact should be avoided until the warts are treated. The use of latex condoms during sexual contact may  reduce, but not entirely prevent, infecting others. This medicine may weaken condoms, diaphragms, cervical caps or other barrier devices and make them less effective as birth control. Do not cover the treated area with an airtight bandage. Cotton gauze dressings can be used. Cotton underwear can be worn after using this medicine on the genital or anal area. Actinic keratoses that were not seen before may appear during treatment and may later go away. The treatment area and surrounding area may lighten or darken after treatment with this medicine. These skin color changes may be permanent in some patients. If you experience a skin reaction at the treatment site that interferes or prevents you from doing any daily activity, contact your health care provider. You may need a rest period from treatment. Treatment may be restarted once the reaction has gotten better as recommended by your doctor or health care professional. This medicine can make you more sensitive to the sun. Keep out of the  the sun. If you cannot avoid being in the sun, wear protective clothing and use sunscreen. Do not use sun lamps or tanning beds/booths. What side effects may I notice from receiving this medicine? Side effects that you should report to your doctor or health care professional as soon as possible:  open sores with or without drainage  skin infection  skin rash  unusual or severe skin reaction Side effects that usually do not require medical attention (report to your doctor or health care professional if they continue or are bothersome):  burning or itching  redness of the skin (very common but is usually not painful or harmful)  scabbing, crusting, or peeling skin  skin that becomes hard or thickened  swelling of the skin This list may not describe all possible side effects. Call your doctor for medical advice about side effects. You may report side effects to FDA at 1-800-FDA-1088. Where should I keep my  medicine? Keep out of the reach of children. Store between 4 and 25 degrees C (39 and 77 degrees F). Do not freeze. Throw away any unused medicine after the expiration date. Discard packet after applying to affected area. Partial packets should not be saved or reused. NOTE: This sheet is a summary. It may not cover all possible information. If you have questions about this medicine, talk to your doctor, pharmacist, or health care provider.  2020 Elsevier/Gold Standard (2008-10-16 10:33:25)  

## 2020-11-13 NOTE — Progress Notes (Signed)
   Subjective:    Patient ID: Leslie Gates, female    DOB: 07-22-1966, 54 y.o.   MRN: [id number] CC: itching down there with bumps HPI 54 yo G3P3, SVD x2 and C/S x 1 seen for 1 year hx of vaginal itching and bumps/warts in her genital area.  The lesions are always present and the itching is intermittent.  The patient notes history of HSV infection.  The patient does have a personal history of diabetes and HIV infection.  The patient also has a history of CIN 1 and did not pursue follow up last year.   Review of Systems  Constitutional: Negative.   HENT: Negative.   Respiratory: Negative.   Cardiovascular: Negative.   Gastrointestinal: Negative.   Genitourinary: Positive for genital sores and vaginal discharge.       Vaginal itching   Musculoskeletal: Negative.   Neurological: Negative.        Objective:   Physical Exam Vitals reviewed.  Constitutional:      Appearance: Normal appearance. She is obese.  HENT:     Head: Normocephalic and atraumatic.  Genitourinary:    Comments: External genitalia: multiple warty growths just inferior to vaginal opening, on the left gluteus and bilateral labia No obvious HSV lesions Slightly excoriated areas on right labia and perineum Vagina: small amount of thicker cheesy white discharge Cervix: visually WNL, pap taken Musculoskeletal:        General: Normal range of motion.  Neurological:     Mental Status: She is alert.    Vitals:   11/13/20 1021  BP: 117/78  Pulse: 81         Assessment & Plan:   1. Genital warts Pt given option of TCA application or Aldara cream.  Pt desires aldara.  Pt advised to place topically Mon, Wed, Fri at night and to wash off first thing in the morning.  She can take the medication for up to 8 weeks.  If the aldara is too expensive, pt will reschedule for TCA application  2. Dysplasia of cervix, low grade (CIN 1) repap taken for follow up - Cytology - PAP( [place])  3. History of herpes  genitalis HSV culture taken, but no obvious HSV ulcers were noted - Herpes simplex virus culture  4. Vaginal itching Pt had cheesy white vaginal discharge, will empirically treat for yeast infx - Cervicovaginal ancillary only( [place]) - Herpes simplex virus culture - fluconazole (DIFLUCAN) 150 MG tablet; Take 1 tablet (150 mg total) by mouth once for 1 dose. Can take additional dose three days later if symptoms persist  Dispense: 1 tablet; Refill: 1  5. Amenorrhea Pt likely post menopausal - POCT urine pregnancy  6. Vaginal discharge See vaginal itching - fluconazole (DIFLUCAN) 150 MG tablet; Take 1 tablet (150 mg total) by mouth once for 1 dose. Can take additional dose three days later if symptoms persist  Dispense: 1 tablet; Refill: 1   F/u in 8 weeks to check progress with aldara, otherwise schedule for TCA application if too expensive or  Not effective. Warden Fillers, MD Faculty Attending, Center for [facility]

## 2020-11-14 LAB — CERVICOVAGINAL ANCILLARY ONLY
Bacterial Vaginitis (gardnerella): NEGATIVE
Candida Glabrata: NEGATIVE
Candida Vaginitis: POSITIVE — AB
Chlamydia: NEGATIVE
Comment: NEGATIVE
Comment: NEGATIVE
Comment: NEGATIVE
Comment: NEGATIVE
Comment: NEGATIVE
Comment: NORMAL
Neisseria Gonorrhea: NEGATIVE
Trichomonas: NEGATIVE

## 2020-11-16 LAB — HERPES SIMPLEX VIRUS CULTURE

## 2020-11-18 ENCOUNTER — Other Ambulatory Visit: Payer: Self-pay

## 2020-11-18 LAB — CYTOLOGY - PAP
Comment: NEGATIVE
Diagnosis: NEGATIVE
Diagnosis: REACTIVE
High risk HPV: NEGATIVE

## 2020-11-18 MED ORDER — ACYCLOVIR 800 MG PO TABS: ORAL_TABLET | ORAL | 0 refills | Status: DC

## 2020-11-19 ENCOUNTER — Other Ambulatory Visit: Payer: Self-pay | Admitting: Family Medicine

## 2020-11-19 DIAGNOSIS — Z1231 Encounter for screening mammogram for malignant neoplasm of breast: Secondary | ICD-10-CM

## 2020-11-29 ENCOUNTER — Other Ambulatory Visit: Payer: Self-pay

## 2020-11-29 DIAGNOSIS — Z79899 Other long term (current) drug therapy: Secondary | ICD-10-CM

## 2020-11-29 DIAGNOSIS — Z113 Encounter for screening for infections with a predominantly sexual mode of transmission: Secondary | ICD-10-CM

## 2020-11-29 DIAGNOSIS — B20 Human immunodeficiency virus [HIV] disease: Secondary | ICD-10-CM

## 2020-12-02 ENCOUNTER — Other Ambulatory Visit: Payer: Medicaid Other

## 2020-12-03 ENCOUNTER — Other Ambulatory Visit: Payer: Medicaid Other

## 2020-12-16 ENCOUNTER — Other Ambulatory Visit: Payer: Self-pay

## 2020-12-16 ENCOUNTER — Ambulatory Visit (INDEPENDENT_AMBULATORY_CARE_PROVIDER_SITE_OTHER): Payer: Medicaid Other | Admitting: Internal Medicine

## 2020-12-16 ENCOUNTER — Encounter: Payer: Self-pay | Admitting: Internal Medicine

## 2020-12-16 VITALS — BP 139/71 | HR 71 | Temp 97.7°F | Ht 63.0 in | Wt 215.0 lb

## 2020-12-16 DIAGNOSIS — B20 Human immunodeficiency virus [HIV] disease: Secondary | ICD-10-CM

## 2020-12-16 DIAGNOSIS — Z79899 Other long term (current) drug therapy: Secondary | ICD-10-CM | POA: Diagnosis not present

## 2020-12-16 DIAGNOSIS — E119 Type 2 diabetes mellitus without complications: Secondary | ICD-10-CM | POA: Diagnosis not present

## 2020-12-16 MED ORDER — BICTEGRAVIR-EMTRICITAB-TENOFOV 50-200-25 MG PO TABS: 1.0000 | ORAL_TABLET | Freq: Every day | ORAL | 11 refills | Status: DC

## 2020-12-16 NOTE — Progress Notes (Signed)
RFV: follow up for hiv disease  Patient ID: Leslie Gates, female   DOB: Aug 31, 1966, 55 y.o.   MRN: [id number]  HPI Leslie Gates is a 55yo F with well controlled HIV disease, she reports that she is noticing increase in Feeling nausea, nad dry mouth --she thinks it is from metformin Otherwise doing okay with her health. Trying to improve diabetes management. She reports that she Needs pfizer booster  Outpatient Encounter Medications as of 12/16/2020  Medication Sig  . acyclovir (ZOVIRAX) 800 MG tablet Take one 800 mg tablet BID for 5 days.  Marland Kitchen amLODipine (NORVASC) 10 MG tablet Take 1 tablet (10 mg total) by mouth daily.  Marland Kitchen atorvastatin (LIPITOR) 20 MG tablet TAKE ONE TABLET BY MOUTH EVERY DAY  . beclomethasone (BECONASE-AQ) 42 MCG/SPRAY nasal spray Place 1 spray into both nostrils 2 (two) times daily. Dose is for each nostril. (Patient taking differently: Place 1 spray into both nostrils 2 (two) times daily as needed for allergies or rhinitis.)  . cetirizine (ZYRTEC) 10 MG tablet TAKE ONE TABLET BY MOUTH DAILY  . furosemide (LASIX) 20 MG tablet TAKE ONE TABLET BY MOUTH EVERY DAY. MAY TAKE EXTRA TABLET EVERY DAY AS NEEDED FOR WEIGHT GAIN 3 LBS OR MORE OVERNIGHT  . GENVOYA 150-150-200-10 MG TABS tablet TAKE 1 TABLET BY MOUTH EVERY DAY WITH BREAKFAST  . imiquimod (ALDARA) 5 % cream Apply topically 3 (three) times a week.  Marland Kitchen JARDIANCE 25 MG TABS tablet TAKE ONE TABLET BY MOUTH EVERY DAY  . metFORMIN (GLUCOPHAGE-XR) 500 MG 24 hr tablet Take 2 tablets (1,000 mg total) by mouth daily with breakfast. In 1 month, increase to 4 tablets (2000mg  total) daily.  . Multiple Vitamin (TAB-A-VITE) TABS TAKE ONE TABLET BY MOUTH DAILY  . NAPHCON-A 0.025-0.3 % ophthalmic solution INSTILL 1 DROP INTO BOTH EYES FOUR TIMES A DAY AS NEEDED FOR IRRITATION  . omeprazole (PRILOSEC) 40 MG capsule TAKE ONE CAPSULE BY MOUTH DAILY  . OXYGEN Inhale 2 L into the lungs at bedtime.  PROAIR HFA 108 (90 Base) MCG/ACT inhaler  INHALE 1 OR 2 PUFFS INTO THE LUNGS EVERY 6 HOURS AS NEEDED FOR WHEEZING OR SHORTNESS OF BREATH  . spironolactone (ALDACTONE) 25 MG tablet TAKE ONE-HALF OF A TABLET BY MOUTH AT BEDTIME  . SYMBICORT 160-4.5 MCG/ACT inhaler INHALE 2 PUFFS INTO THE LUNGS TWO TIMES DAILY  . atorvastatin (LIPITOR) 40 MG tablet Take 1 tablet (40 mg total) by mouth daily. (Patient not taking: No sig reported)   No facility-administered encounter medications on file as of 12/16/2020.     Patient Active Problem List   Diagnosis Date Noted  . Genital warts 11/13/2020  . Dysplasia of cervix, low grade (CIN 1) 11/13/2020  . History of herpes genitalis 11/13/2020  . Vaginal itching 11/13/2020  . Type 2 diabetes mellitus without complication, without long-term current use of insulin (HCC) 12/15/2019  . O2 dependent 07/22/2018  . Hyperlipidemia 04/29/2018  . Chronic diastolic heart failure (HCC) 06/25/2017  . Moderate dysplasia of cervix 07/10/2013  . History of angioedema 09/09/2011  . Obesity hypoventilation syndrome (HCC) 07/10/2010  . Morbid obesity (HCC) 07/01/2010  . Obstructive sleep apnea 06/27/2010  . Pulmonary hypertension (HCC) 05/28/2010  . Hypertension associated with type 2 diabetes mellitus (HCC) 03/22/2007  . HIV disease (HCC) 06/21/2002     Health Maintenance Due  Topic Date Due  . OPHTHALMOLOGY EXAM  Never done  . Fecal DNA (Cologuard)  Never done  . COVID-19 Vaccine (3 - Pfizer risk 4-dose  series) 04/24/2020  . MAMMOGRAM  12/05/2020  . URINE MICROALBUMIN  12/14/2020  soc hx: no smoking or drinking Review of Systems 12 point ros except what is mentioned in hpi Physical Exam   BP 139/71   Pulse 71   Temp 97.7 F (36.5 C) (Oral)   Ht 5\' 3"  (1.6 m)   Wt 215 lb (97.5 kg)   SpO2 96%   BMI 38.09 kg/m   Physical Exam  Constitutional:  oriented to person, place, and time. appears well-developed and well-nourished. No distress.  HENT: [place]/AT, PERRLA, no scleral icterus Mouth/Throat:  Oropharynx is clear and moist. No oropharyngeal exudate.  Cardiovascular: Normal rate, regular rhythm and normal heart sounds. Exam reveals no gallop and no friction rub.  No murmur heard.  Pulmonary/Chest: Effort normal and breath sounds normal. No respiratory distress.  has no wheezes.  Neck = supple, no nuchal rigidity Abdominal: Soft. Bowel sounds are normal.  exhibits no distension. There is no tenderness.  Lymphadenopathy: no cervical adenopathy. No axillary adenopathy Neurological: alert and oriented to person, place, and time.  Skin: Skin is warm and dry. No rash noted. No erythema.  Psychiatric: a normal mood and affect.  behavior is normal.   Lab Results  Component Value Date   CD4TCELL 35 08/27/2020   Lab Results  Component Value Date   CD4TABS 652 08/27/2020   CD4TABS 909 02/05/2020   CD4TABS 501 11/07/2019   Lab Results  Component Value Date   HIV1RNAQUANT <20 (H) 08/27/2020   Lab Results  Component Value Date   HEPBSAB NEG 02/08/2009   Lab Results  Component Value Date   LABRPR NON-REACTIVE 08/27/2020    CBC Lab Results  Component Value Date   WBC 4.9 08/27/2020   RBC 4.14 08/27/2020   HGB 12.5 08/27/2020   HCT 38.2 08/27/2020   PLT 318 08/27/2020   MCV 92.3 08/27/2020   MCH 30.2 08/27/2020   MCHC 32.7 08/27/2020   RDW 12.4 08/27/2020   LYMPHSABS 1,911 08/27/2020   MONOABS 517 03/30/2017   EOSABS 172 08/27/2020    BMET Lab Results  Component Value Date   NA 137 08/27/2020   K 3.8 08/27/2020   CL 99 08/27/2020   CO2 31 08/27/2020   GLUCOSE 250 (H) 08/27/2020   BUN 11 08/27/2020   CREATININE 0.66 08/27/2020   CALCIUM 8.9 08/27/2020   GFRNONAA 101 08/27/2020   GFRAA 117 08/27/2020      Assessment and Plan hiv disease = will check labs and plan to Will switch to biktarvy so that to decrease risk of drug interaction with genvovya(cobi)  Long term medication management = continue with biktarvy since cr is stable  Diabetes mellitus,  type 2 with hyperglycemia = starting metformin to help hyperglycemia, and potentially weight loss  Health maintenance = discussed the importance to get booster Needs pfizer booster

## 2020-12-17 DIAGNOSIS — G4733 Obstructive sleep apnea (adult) (pediatric): Secondary | ICD-10-CM | POA: Diagnosis not present

## 2020-12-17 DIAGNOSIS — R0902 Hypoxemia: Secondary | ICD-10-CM | POA: Diagnosis not present

## 2020-12-17 DIAGNOSIS — B20 Human immunodeficiency virus [HIV] disease: Secondary | ICD-10-CM | POA: Diagnosis not present

## 2020-12-17 DIAGNOSIS — R062 Wheezing: Secondary | ICD-10-CM | POA: Diagnosis not present

## 2020-12-20 DIAGNOSIS — H5213 Myopia, bilateral: Secondary | ICD-10-CM | POA: Diagnosis not present

## 2020-12-20 LAB — HM DIABETES EYE EXAM

## 2020-12-25 ENCOUNTER — Other Ambulatory Visit: Payer: Self-pay

## 2020-12-25 ENCOUNTER — Ambulatory Visit
Admission: RE | Admit: 2020-12-25 | Discharge: 2020-12-25 | Disposition: A | Discharge status: Home / Self Care | Payer: Medicaid Other | Source: Ambulatory Visit | Attending: *Deleted | Admitting: *Deleted

## 2020-12-25 DIAGNOSIS — Z1231 Encounter for screening mammogram for malignant neoplasm of breast: Secondary | ICD-10-CM

## 2021-01-03 ENCOUNTER — Ambulatory Visit (INDEPENDENT_AMBULATORY_CARE_PROVIDER_SITE_OTHER): Payer: Medicaid Other | Admitting: Obstetrics and Gynecology

## 2021-01-03 ENCOUNTER — Other Ambulatory Visit: Payer: Self-pay

## 2021-01-03 ENCOUNTER — Encounter: Payer: Self-pay | Admitting: Obstetrics and Gynecology

## 2021-01-03 VITALS — BP 133/75 | HR 71 | Ht 63.0 in | Wt 210.0 lb

## 2021-01-03 DIAGNOSIS — N898 Other specified noninflammatory disorders of vagina: Secondary | ICD-10-CM | POA: Diagnosis not present

## 2021-01-03 DIAGNOSIS — A630 Anogenital (venereal) warts: Secondary | ICD-10-CM | POA: Diagnosis not present

## 2021-01-03 NOTE — Progress Notes (Signed)
RGYN patient presents for Follow up.  Last seen in office on 11/13/20 for itching/bumps. Pt was prescribed Aldara Cream or TCA .   Patient notes

## 2021-01-03 NOTE — Patient Instructions (Signed)
Genital Warts  Genital warts are a common STI (sexually transmitted infection). They are small warts in the area around the genitals or the opening of the butt (anus). They sometimes cause pain. Genital warts are easily passed to other people through sex. Many people do not know that they are infected. They may be infected for years without symptoms. Even without symptoms, they can pass the infection to their sex partners. What are the causes? Genital warts are caused by a virus called HPV. HPV spreads from person to person during sex. It can be spread through vaginal, anal, and oral sex. What increases the risk?  Having sex without using a condom.  Having sex with many people.  Having sex before the age of 16.  Being a female who is not circumcised.  Having a female sex partner who is not circumcised.  Having a weak body defense system (immune system). What are the signs or symptoms?  Small warts in the area around the genitals or the opening of the butt. These warts often grow in clusters.  Itching and irritation in the genital area or the area around the opening of the butt.  Bleeding from the warts.  Pain during sex. How is this treated?  Medicines, such as creams, that are put on the skin.  Procedures, such as: ? Freezing the warts. ? Burning the warts. ? Having surgery to get rid of the warts. Getting treatment is important because genital warts can lead to other problems. In females, the virus that causes the warts may increase the risk for cancer of the cervix. Follow these instructions at home: Medicines  Apply over-the-counter and prescription medicines only as told by your doctor.  Do not use medicines that are meant for treating hand warts.  Talk with your doctor about using creams to treat itching.   Instructions for women  Get regular tests to check for cancer of the cervix. This type of cancer grows slowly and can almost always be cured if it is found  early.  If you become pregnant, tell your doctor that you have had genital warts. General instructions  Do not touch or scratch the warts.  Do not have sex until your treatment is done.  Tell your current and past sex partners about your condition. They may need treatment.  After treatment, use condoms during sex.  Keep all follow-up visits as told by your doctor. This is important. How is this prevented? Talk with your doctor about getting the HPV shot. The HPV shot:  Can help stop some HPV infections and cancers.  Is given to males and females who are 11-26 years old.  Is not recommended for pregnant women.  Will not work if you already have HPV. Contact a doctor if:  You have redness, swelling, or pain in the area of the treated skin.  You have a fever.  You feel sick.  You have lumps or have bleeding in the area around your genitals or the opening of your butt.  You have pain during sex.  You have bleeding after sex. Summary  Genital warts are a common STI. They are small warts in the area around the genitals or the opening of the butt (anus).  A virus called HPV causes genital warts.  The virus is spread by having sex without using a condom.  Getting treatment is important.  This condition is treated using medicines. Freezing, burning, or surgery may be done to get rid of the warts. This information   is not intended to replace advice given to you by your health care provider. Make sure you discuss any questions you have with your health care provider. Document Revised: 09/14/2019 Document Reviewed: 09/14/2019 Elsevier Patient Education  2021 Elsevier Inc.  

## 2021-01-03 NOTE — Progress Notes (Signed)
   Subjective:    Patient ID: Leslie Gates, female    DOB: Oct 28, 1966, 55 y.o.   MRN: [id number]  HPI Pt seen.  She did use the aldara cream. Other than some mild skin discomfort she has not noted a major resolution of the lesions.  Pt may have had an HSV outbreak at last visit.  HSV culture was positive.  She notes symptoms resolved after acyclovir.     Review of Systems  Skin:       Raised lesions to genitalia        Objective:   Physical Exam Genitourinary:    Comments: Urogenital skin appears healthy, HPV lesions still noted on left gluteal gold and left upper labia majora   Vitals:   01/03/21 1043  BP: 133/75  Pulse: 71         Assessment & Plan:   1. Genital warts HPV lesions still present, pt will try TCA treatment for 3-5 weeks, first treatment in 2 weeks  2. Vaginal itching Previous diagnosis of HSV, treated with acyclovir    F/u in 2 weeks for TCA treatment # 1   Warden Fillers, MD Faculty Attending, Center for [facility]

## 2021-01-05 NOTE — Progress Notes (Signed)
Subjective:   Patient ID: Leslie Gates    DOB: Jan 28, 1966, 55 y.o. female   MRN: [id number]  Leslie Gates is a 55 y.o. female with a history of chronic diastolic HF, HTN, pulmonary HTN, OSA, T2DM, genital warts, h/o cervical dysplasia, h/o angioedema, h/o herpes genitalis, HIV disease, HLD, morbid obesity, O2 dependent, OHS  here for diabetes follow up  Diabetes: Last three A1C's below. Currently on Metformin 1000mg  QD. Endorses compliance but also had more diarrhea with this higher dose. Denies taking Jardiance 25mg  QD. Endorses polyuria. Endorses desire to work out. Desires letter to be able to work out at .  Lab Results  Component Value Date   HGBA1C 10.6 (A) 01/06/2021   HGBA1C 7.6 (A) 09/02/2020   HGBA1C 7.0 02/01/2020    HTN:  BP: 140/86 today. Currently on Spironolactone 25mg  QD, Amlodipine 10mg  QD. Endorses compliance. Non-smoker. Denies any chest pain, SOB, vision changes, or headaches.   Lab Results  Component Value Date   CREATININE 0.66 08/27/2020   CREATININE 0.80 02/01/2020   CREATININE 0.84 11/07/2019   HLD: Last lipid panel below. Currently on Lipitor 40mg  QD but declines taking this medication.  The 10-year ASCVD risk score DC Jr., et al., 2013) is: 10%  LDL goal <80 in setting of HTN and DM.   Lab Results  Component Value Date   CHOL 176 02/01/2020   HDL 66 02/01/2020   LDLCALC 91 02/01/2020   LDLDIRECT 122 (H) 04/29/2018   TRIG 104 02/01/2020   CHOLHDL 2.7 02/01/2020   Health Maintenance: Health Maintenance Due  Topic  . Fecal DNA (Cologuard)   . COVID-19 Vaccine (3 - Pfizer risk 4-dose series)   Review of Systems:  Per HPI.   Objective:   BP 140/86   Pulse 86   Wt 209 lb 12.8 oz (95.2 kg)   LMP 12/23/2020   SpO2 97%   BMI 37.16 kg/m  Vitals and nursing note reviewed.  General: pleasant older woman, sitting comfortably in exam chair, well nourished, well developed, in no acute distress with non-toxic appearance   Resp: breathing comfortably on room air, speaking in full sentences MSK:  gait normal Neuro: Alert and oriented, speech normal  Assessment & Plan:   Hypertension associated with type 2 diabetes mellitus (HCC) Chronic. Slightly elevated today. Deferred changes due to unsure if patient is truly taking Amlodipine given it wasn't on her medication list.  - continue Amlodipine 10mg  QD. Refills provided. - Continue Spironolactone 25mg  QD - patient also starting Jardiance which may benefit BP some as well - patient to monitor BP and keep log and bring with her at follow up visit on 02/05/21  Type 2 diabetes mellitus without complication, without long-term current use of insulin (HCC) Chronic, uncontrolled and worsening. >50% of visit spent on reviewing medication as confusion appeared to play significant role in her compliance. - Although she has diarrhea with Metformin, patient was open to taking 1000mg  BID - Start Jardiance 25mg  QD - Follow up in 1 month for continued close monitoring - Likely benefit from GLP if still uncontrolled - patient working on 05/01/2018. Letter provided. Encouraged healthy eating, portion control, and limiting carbohydrates - BMP at follow up visit to monitor kidney function  Hyperlipidemia Chronic.  Lipid panel today. Continue Atorvastatin 40mg  daily  Medication management >50% of visit spent on discussing mediation list and there indications. List with written instructions provided. Patient to follow up in 1 month for continued close monitoring and  instructed to bring medications with her at that time.  Orders Placed This Encounter  Procedures  . LDL Cholesterol, Direct  . POCT glycosylated hemoglobin (Hb A1C)  . POCT UA - Microalbumin   Meds ordered this encounter  Medications  . atorvastatin (LIPITOR) 40 MG tablet    Sig: Take 1 tablet (40 mg total) by mouth daily.    Dispense:  90 tablet    Refill:  3  . empagliflozin (JARDIANCE) 25 MG TABS  tablet    Sig: Take 1 tablet (25 mg total) by mouth daily.    Dispense:  30 tablet    Refill:  3  . metFORMIN (GLUCOPHAGE-XR) 500 MG 24 hr tablet    Sig: Take 2 tablets (1,000 mg total) by mouth in the morning and at bedtime.    Dispense:  360 tablet    Refill:  3  . amLODipine (NORVASC) 10 MG tablet    Sig: Take 1 tablet (10 mg total) by mouth at bedtime.    Dispense:  90 tablet    Refill:  3    Orpah Cobb, DO PGY-3, [facility] 01/06/2021 5:21 PM

## 2021-01-06 ENCOUNTER — Other Ambulatory Visit: Payer: Self-pay

## 2021-01-06 ENCOUNTER — Ambulatory Visit (INDEPENDENT_AMBULATORY_CARE_PROVIDER_SITE_OTHER): Payer: Medicaid Other | Admitting: Family Medicine

## 2021-01-06 ENCOUNTER — Encounter: Payer: Self-pay | Admitting: Family Medicine

## 2021-01-06 VITALS — BP 140/86 | HR 86 | Wt 209.8 lb

## 2021-01-06 DIAGNOSIS — E782 Mixed hyperlipidemia: Secondary | ICD-10-CM

## 2021-01-06 DIAGNOSIS — E118 Type 2 diabetes mellitus with unspecified complications: Secondary | ICD-10-CM

## 2021-01-06 DIAGNOSIS — I152 Hypertension secondary to endocrine disorders: Secondary | ICD-10-CM

## 2021-01-06 DIAGNOSIS — E1159 Type 2 diabetes mellitus with other circulatory complications: Secondary | ICD-10-CM

## 2021-01-06 DIAGNOSIS — Z79899 Other long term (current) drug therapy: Secondary | ICD-10-CM

## 2021-01-06 DIAGNOSIS — E119 Type 2 diabetes mellitus without complications: Secondary | ICD-10-CM | POA: Diagnosis not present

## 2021-01-06 LAB — POCT GLYCOSYLATED HEMOGLOBIN (HGB A1C): Hemoglobin A1C: 10.6 % — AB (ref 4.0–5.6)

## 2021-01-06 LAB — POCT UA - MICROALBUMIN
Albumin/Creatinine Ratio, Urine, POC: <30
Creatinine, POC: 200 mg/dL
Microalbumin Ur, POC: 30 mg/L

## 2021-01-06 MED ORDER — ATORVASTATIN CALCIUM 40 MG PO TABS: 40.0000 mg | ORAL_TABLET | Freq: Every day | ORAL | 3 refills | Status: DC

## 2021-01-06 MED ORDER — AMLODIPINE BESYLATE 10 MG PO TABS: 10.0000 mg | ORAL_TABLET | Freq: Every day | ORAL | 3 refills | Status: DC

## 2021-01-06 MED ORDER — METFORMIN HCL ER 500 MG PO TB24: 1000.0000 mg | ORAL_TABLET | Freq: Two times a day (BID) | ORAL | 3 refills | Status: DC

## 2021-01-06 MED ORDER — EMPAGLIFLOZIN 25 MG PO TABS: 25.0000 mg | ORAL_TABLET | Freq: Every day | ORAL | 3 refills | Status: DC

## 2021-01-06 NOTE — Assessment & Plan Note (Signed)
>  50% of visit spent on discussing mediation list and there indications. List with written instructions provided. Patient to follow up in 1 month for continued close monitoring and instructed to bring medications with her at that time.

## 2021-01-06 NOTE — Assessment & Plan Note (Addendum)
Chronic.  Lipid panel today. Continue Atorvastatin 40mg  daily

## 2021-01-06 NOTE — Patient Instructions (Signed)
It was a pleasure to see you today!  Thank you for choosing [facility] for your primary care.   Our plans for today were:  Diabetes: Your A1C has increased from 7.6 to 10.6!  Take Metformin 1000mg  (two tablets) in the morning and evening  Start Empagliflozen 25mg  (one tablet) in the morning  Cholesterol:  Take Atorvastatin 40mg  daily (either morning or evening)  Blood pressure  Take Spironolactone 25mg  daily and Amlodipine 10mg  daily   Check you blood pressure daily and keep a log and bring with you next visit  Follow up on 02/05/21   To keep you healthy, please keep in mind the following health maintenance items that you are due for:   1. TDAP 2.  Colon cancer screen  Best Wishes,   , DO

## 2021-01-06 NOTE — Assessment & Plan Note (Addendum)
Chronic. Slightly elevated today. Deferred changes due to unsure if patient is truly taking Amlodipine given it wasn't on her medication list.  - continue Amlodipine 10mg  QD. Refills provided. - Continue Spironolactone 25mg  QD - patient also starting Jardiance which may benefit BP some as well - patient to monitor BP and keep log and bring with her at follow up visit on 02/05/21

## 2021-01-06 NOTE — Assessment & Plan Note (Signed)
Chronic, uncontrolled and worsening. >50% of visit spent on reviewing medication as confusion appeared to play significant role in her compliance. - Although she has diarrhea with Metformin, patient was open to taking 1000mg  BID - Start Jardiance 25mg  QD - Follow up in 1 month for continued close monitoring - Likely benefit from GLP if still uncontrolled - patient working on . Letter provided. Encouraged healthy eating, portion control, and limiting carbohydrates - BMP at follow up visit to monitor kidney function

## 2021-01-07 ENCOUNTER — Encounter: Payer: Self-pay | Admitting: Family Medicine

## 2021-01-07 LAB — MICROALBUMIN / CREATININE URINE RATIO
Creatinine, Urine: 133.7 mg/dL
Microalb/Creat Ratio: 16 mg/g creat (ref 0–29)
Microalbumin, Urine: 20.9 ug/mL

## 2021-01-07 LAB — LDL CHOLESTEROL, DIRECT: LDL Direct: 144 mg/dL — ABNORMAL HIGH (ref 0–99)

## 2021-01-14 DIAGNOSIS — B20 Human immunodeficiency virus [HIV] disease: Secondary | ICD-10-CM | POA: Diagnosis not present

## 2021-01-14 DIAGNOSIS — R0902 Hypoxemia: Secondary | ICD-10-CM | POA: Diagnosis not present

## 2021-01-14 DIAGNOSIS — G4733 Obstructive sleep apnea (adult) (pediatric): Secondary | ICD-10-CM | POA: Diagnosis not present

## 2021-01-14 DIAGNOSIS — R062 Wheezing: Secondary | ICD-10-CM | POA: Diagnosis not present

## 2021-01-17 ENCOUNTER — Other Ambulatory Visit: Payer: Self-pay | Admitting: Family Medicine

## 2021-01-17 DIAGNOSIS — I5032 Chronic diastolic (congestive) heart failure: Secondary | ICD-10-CM

## 2021-01-22 ENCOUNTER — Ambulatory Visit: Payer: Medicaid Other | Admitting: Obstetrics and Gynecology

## 2021-01-28 ENCOUNTER — Ambulatory Visit: Payer: Medicaid Other | Admitting: Pulmonary Disease

## 2021-02-04 NOTE — Progress Notes (Deleted)
Subjective:   Patient ID: Leslie Gates    DOB: April 17, 1966, 55 y.o. female   MRN: [id number]  Leslie Gates is a 55 y.o. female with a history of chronic diastolic HF, HTN, pulmonary HTN, OSA, T2DM, genital warts, CIN1 cervical dysplasia, h/o angioedema, h/o herpes genitalis, HIV disease, HLD, morbid obesity, O2 dependent, OHS here for follow up.   Diabetes: Last three A1C's below. Currently on Metformin 1000mg  BID, Jardiance 25mg  QD. Endorses compliance. Notes CBGs range ***. Denies any hypoglycemia. Denies any polyuria, polydipsia, polyphagia. Due for ***. Dexcom: document in plan "A1C ***. Currently monitoring and taking >3 injection per day". Initial RX send message to RN team. Refills - just send to pharmacy "Patient is a good candidate for a CGM device given history of hypoglycemia and using 3 insulin injections per day."  - Continuous Blood Gluc Receiver (DEXCOM G6 RECEIVER) DEVI  - Continuous Blood Gluc Sensor (DEXCOM G6 SENSOR) MISC  - Continuous Blood Gluc Transmit (DEXCOM G6 TRANSMITTER) MISC Lantus/Novolug/Humalog Kwikpens/GLP-1 pens - still need to order BD Insulin Pen Needle (B-D UF III MINI PEN NEEDLES) 31G X 5 MM MISC  Lab Results  Component Value Date   HGBA1C 10.6 (A) 01/06/2021   HGBA1C 7.6 (A) 09/02/2020   HGBA1C 7.0 02/01/2020    HTN:    today. Currently on Amlodipine 10mg  QD, Spironolactone 25mg  QD. Endorses compliance. Non-smoker/Current everyday smoker***. Denies any chest pain, SOB, vision changes, or headaches.   Lab Results  Component Value Date   CREATININE 0.66 08/27/2020   CREATININE 0.80 02/01/2020   CREATININE 0.84 11/07/2019     HLD: Last lipid panel below. Restarted on  Atorvastatin 40mg  QD. Endorses compliance. Denies any muscles aches or weakness. The 10-year ASCVD risk score DC 10/27/2020., et al., 2013) is: 10%  (Start statin if LDL >190 or ASCVD score >10, talk with patient if score btw 5-10%-can consider calcium score, dont recommend if  <5 and repeat in 12 mo) (IF start repeat level in 6-8 weeks for 30-50% drop with high statin)   Lab Results  Component Value Date   CHOL 176 02/01/2020   HDL 66 02/01/2020   LDLCALC 91 02/01/2020   LDLDIRECT 144 (H) 01/06/2021   TRIG 104 02/01/2020   CHOLHDL 2.7 02/01/2020    Review of Systems:  Per HPI.   Objective:   There were no vitals taken for this visit. Vitals and nursing note reviewed.  General: pleasant ***, sitting comfortably in exam chair, well nourished, well developed, in no acute distress with non-toxic appearance HEENT: normocephalic, atraumatic, moist mucous membranes, oropharynx clear without erythema or exudate, TM normal bilaterally  Neck: supple, non-tender without lymphadenopathy CV: regular rate and rhythm without murmurs, rubs, or gallops, no lower extremity edema, 2+ radial and pedal pulses bilaterally Lungs: clear to auscultation bilaterally with normal work of breathing on room air Resp: breathing comfortably on room air, speaking in full sentences Abdomen: soft, non-tender, non-distended, no masses or organomegaly palpable, normoactive bowel sounds Skin: warm, dry, no rashes or lesions Extremities: warm and well perfused, normal tone MSK: ROM grossly intact, strength intact, gait normal Neuro: Alert and oriented, speech normal  Assessment & Plan:   No problem-specific Assessment & Plan notes found for this encounter.  No orders of the defined types were placed in this encounter.  No orders of the defined types were placed in this encounter.  liukely benefit from GLP-1 if still uncontrolled  {    This will disappear when note  is signed, click to select method of visit    :1}  Orpah Cobb, DO PGY-3, [facility] 02/04/2021 6:04 PM

## 2021-02-05 ENCOUNTER — Ambulatory Visit: Payer: Medicaid Other | Admitting: Family Medicine

## 2021-02-05 DIAGNOSIS — E1159 Type 2 diabetes mellitus with other circulatory complications: Secondary | ICD-10-CM

## 2021-02-05 DIAGNOSIS — E119 Type 2 diabetes mellitus without complications: Secondary | ICD-10-CM

## 2021-02-14 DIAGNOSIS — G4733 Obstructive sleep apnea (adult) (pediatric): Secondary | ICD-10-CM | POA: Diagnosis not present

## 2021-02-14 DIAGNOSIS — B20 Human immunodeficiency virus [HIV] disease: Secondary | ICD-10-CM | POA: Diagnosis not present

## 2021-02-14 DIAGNOSIS — R0902 Hypoxemia: Secondary | ICD-10-CM | POA: Diagnosis not present

## 2021-02-14 DIAGNOSIS — R062 Wheezing: Secondary | ICD-10-CM | POA: Diagnosis not present

## 2021-02-16 ENCOUNTER — Other Ambulatory Visit: Payer: Self-pay | Admitting: Family Medicine

## 2021-02-20 ENCOUNTER — Ambulatory Visit: Payer: Medicaid Other | Admitting: Adult Health

## 2021-02-20 ENCOUNTER — Telehealth: Payer: Self-pay | Admitting: Adult Health

## 2021-02-20 ENCOUNTER — Other Ambulatory Visit: Payer: Self-pay

## 2021-02-20 ENCOUNTER — Other Ambulatory Visit: Payer: Medicaid Other

## 2021-02-20 DIAGNOSIS — B20 Human immunodeficiency virus [HIV] disease: Secondary | ICD-10-CM | POA: Diagnosis not present

## 2021-02-20 DIAGNOSIS — Z79899 Other long term (current) drug therapy: Secondary | ICD-10-CM

## 2021-02-20 NOTE — Telephone Encounter (Signed)
Called patient but she did not answer. VM was full so I could not leave a message. Will call back later.

## 2021-02-21 LAB — T-HELPER CELL (CD4) - (RCID CLINIC ONLY)
CD4 % Helper T Cell: 33 % (ref 33–65)
CD4 T Cell Abs: 747 /uL (ref 400–1790)

## 2021-02-23 LAB — COMPLETE METABOLIC PANEL WITH GFR
AG Ratio: 1.1 (calc) (ref 1.0–2.5)
ALT: 16 U/L (ref 6–29)
AST: 23 U/L (ref 10–35)
Albumin: 4.5 g/dL (ref 3.6–5.1)
Alkaline phosphatase (APISO): 78 U/L (ref 37–153)
BUN: 21 mg/dL (ref 7–25)
CO2: 29 mmol/L (ref 20–32)
Calcium: 10.4 mg/dL (ref 8.6–10.4)
Chloride: 96 mmol/L — ABNORMAL LOW (ref 98–110)
Creat: 0.81 mg/dL (ref 0.50–1.05)
GFR, Est African American: 96 mL/min/{1.73_m2} (ref >=60)
GFR, Est Non African American: 83 mL/min/{1.73_m2} (ref >=60)
Globulin: 4.1 g/dL (calc) — ABNORMAL HIGH (ref 1.9–3.7)
Glucose, Bld: 130 mg/dL — ABNORMAL HIGH (ref 65–99)
Potassium: 4 mmol/L (ref 3.5–5.3)
Sodium: 137 mmol/L (ref 135–146)
Total Bilirubin: 0.4 mg/dL (ref 0.2–1.2)
Total Protein: 8.6 g/dL — ABNORMAL HIGH (ref 6.1–8.1)

## 2021-02-23 LAB — HIV-1 RNA QUANT-NO REFLEX-BLD
HIV 1 RNA Quant: NOT DETECTED Copies/mL
HIV-1 RNA Quant, Log: NOT DETECTED Log cps/mL

## 2021-02-23 LAB — CBC WITH DIFFERENTIAL/PLATELET
Absolute Monocytes: 425 cells/uL (ref 200–950)
Basophils Absolute: 59 cells/uL (ref 0–200)
Basophils Relative: 1 %
Eosinophils Absolute: 201 cells/uL (ref 15–500)
Eosinophils Relative: 3.4 %
HCT: 40.8 % (ref 35.0–45.0)
Hemoglobin: 13.6 g/dL (ref 11.7–15.5)
Lymphs Abs: 2490 cells/uL (ref 850–3900)
MCH: 30 pg (ref 27.0–33.0)
MCHC: 33.3 g/dL (ref 32.0–36.0)
MCV: 89.9 fL (ref 80.0–100.0)
MPV: 9.9 fL (ref 7.5–12.5)
Monocytes Relative: 7.2 %
Neutro Abs: 2726 cells/uL (ref 1500–7800)
Neutrophils Relative %: 46.2 %
Platelets: 430 10*3/uL — ABNORMAL HIGH (ref 140–400)
RBC: 4.54 10*6/uL (ref 3.80–5.10)
RDW: 12 % (ref 11.0–15.0)
Total Lymphocyte: 42.2 %
WBC: 5.9 10*3/uL (ref 3.8–10.8)

## 2021-02-23 LAB — LIPID PANEL
Cholesterol: 188 mg/dL (ref <200)
HDL: 68 mg/dL (ref >=50)
LDL Cholesterol (Calc): 90 mg/dL (calc)
Non-HDL Cholesterol (Calc): 120 mg/dL (calc) (ref <130)
Total CHOL/HDL Ratio: 2.8 (calc) (ref <5.0)
Triglycerides: 208 mg/dL — ABNORMAL HIGH (ref <150)

## 2021-02-25 NOTE — Telephone Encounter (Signed)
lmtcb for pt. Pt needs OV.  °

## 2021-02-26 ENCOUNTER — Other Ambulatory Visit: Payer: Self-pay

## 2021-02-26 ENCOUNTER — Ambulatory Visit (INDEPENDENT_AMBULATORY_CARE_PROVIDER_SITE_OTHER): Payer: Medicaid Other | Admitting: Obstetrics and Gynecology

## 2021-02-26 ENCOUNTER — Encounter: Payer: Self-pay | Admitting: Obstetrics and Gynecology

## 2021-02-26 DIAGNOSIS — A630 Anogenital (venereal) warts: Secondary | ICD-10-CM | POA: Diagnosis not present

## 2021-02-26 NOTE — Progress Notes (Signed)
Pt is here today for TCA treatment.

## 2021-02-26 NOTE — Progress Notes (Signed)
CC: genital wart   S: Pt arrives for application of TCA for treatment of genital warts.  Pt has been advised on the risks and benefits of the procedure including localized pain/irritation, skin changes or ulceration.  Consent has been signed.  O: Vitals:   02/26/21 0819  BP: 115/79  Pulse: 76    Lesion(s) identified.  Petroleum jelly applied around the lesion.  80% TCA applied to the lesion with good blanching response.  Pt monitored 3-5 minutes.  Discomfort was minimal.  Sodium bicarbonate solution was available for neutralization if needed.  A/P:  Genital warts Pt advised to wash off the application in 5-6 hours. F/u in 1 week for next application  Mariel Aloe, MD Faculty attending, [facility]

## 2021-02-26 NOTE — Patient Instructions (Signed)
Genital Warts  Genital warts are a common STI (sexually transmitted infection). They may appear as small bumps on the skin of the genital and anal areas. They sometimes become irritated and cause pain. Genital warts are easily passed to other people through sexual contact. Many people do not know that they are infected, and they may be infected for years without symptoms. Even without symptoms, they can pass the infection to their sexual partners. What are the causes? This condition is caused by a virus that is called human papillomavirus (HPV). HPV is spread by having unprotected sex with an infected person. It can be spread through vaginal, anal, and oral sex. What increases the risk? You are more likely to develop this condition if:  You have unprotected sex.  You have multiple sexual partners.  You are sexually active before age 16.  You are a man who is not circumcised.  You have a female sexual partner who is not circumcised.  You have a weakened body defense system (immune system) due to disease or medicine. What are the signs or symptoms? Symptoms of this condition include:  Small growths in the genital area or anal area. These warts often grow in clusters.  Itching and irritation in the genital area or anal area.  Bleeding from the warts.  Pain during sex. How is this diagnosed? This condition is diagnosed based on your symptoms and a physical exam. You may also have other tests, including:  Biopsy. A tissue sample is removed so it can be checked under a microscope.  Colposcopy. In females, a magnifying tool is used to examine the vagina and cervix. Certain solutions may be used to make the HPV cells change color so they can be seen more easily.  A Pap test in females.  Tests for other STIs. How is this treated? This condition may be treated with:  Medicines, such as solutions or creams that are applied to your skin (topical).  Procedures, such as: ? Freezing the  warts with liquid nitrogen (cryotherapy). ? Burning the warts with a laser or electric probe (electrocautery). ? Surgery to remove the warts. Getting treatment is important because genital warts can lead to other problems. In females, the virus that causes genital warts may increase the risk for cervical cancer. Follow these instructions at home: Medicines  Apply over-the-counter and prescription medicines only as told by your health care provider.  Do not treat genital warts with medicines that are used for treating hand warts.  Talk with your health care provider about using over-the-counter anti-itch creams.   Instructions for women  Get screened regularly for cervical cancer. This type of cancer is slow growing and can almost always be cured if it is found early.  If you become pregnant, tell your health care provider that you have had an HPV infection. Your health care provider will monitor you closely during pregnancy. General instructions  Do not touch or scratch the warts.  Do not have sex until your treatment has been completed.  Tell your current and past sexual partners about your condition because they may also need treatment.  After treatment, use condoms during sex to prevent future infections.  Keep all follow-up visits as told by your health care provider. This is important. How is this prevented? Talk with your health care provider about getting the HPV vaccine. The vaccine:  Can prevent some HPV infections and cancers.  Is recommended for males and females who are 11-26 years old.  Is not recommended   for pregnant women.  Will not work if you already have HPV. Contact a health care provider if you:  Have redness, swelling, or pain in the area of the treated skin.  Have a fever.  Feel generally ill.  Feel lumps in and around your genital or anal area.  Have bleeding in your genital or anal area.  Have pain during sex or bleeding after  sex. Summary  Genital warts are a common STI (sexually transmitted infection). It may appear as small bumps on the genital and anal areas.  This condition is caused by a virus that is called human papillomavirus (HPV). HPV is spread by having unprotected sex with an infected person. It can be spread through vaginal, anal, and oral sex.  Treatment is important because genital warts can lead to other problems. In females, the virus that causes genital warts may increase the risk for cervical cancer.  This condition may be treated with medicine that is applied to the skin or procedures to remove the warts.  The HPV vaccine can prevent some HPV infections and cancers. It is recommended that the vaccine be given to males and females who are 11-26 years old. This information is not intended to replace advice given to you by your health care provider. Make sure you discuss any questions you have with your health care provider. Document Revised: 09/14/2019 Document Reviewed: 09/14/2019 Elsevier Patient Education  2021 Elsevier Inc.  

## 2021-02-27 NOTE — Telephone Encounter (Signed)
ATC pt. VM box is full. Will close encounter as we have attempted to reach pt multiple times with no return call.

## 2021-03-05 ENCOUNTER — Encounter: Payer: Self-pay | Admitting: Obstetrics & Gynecology

## 2021-03-05 ENCOUNTER — Other Ambulatory Visit: Payer: Self-pay

## 2021-03-05 ENCOUNTER — Ambulatory Visit (INDEPENDENT_AMBULATORY_CARE_PROVIDER_SITE_OTHER): Payer: Medicaid Other | Admitting: Obstetrics & Gynecology

## 2021-03-05 VITALS — BP 128/80 | HR 80 | Wt 212.0 lb

## 2021-03-05 DIAGNOSIS — A630 Anogenital (venereal) warts: Secondary | ICD-10-CM

## 2021-03-05 NOTE — Progress Notes (Signed)
Patient scheduled for TCA treatment. She only wanted to see Dr. Donavan Foil for this treatment. Her appointment was rescheduled as per her request.   Jaynie Collins, MD

## 2021-03-10 ENCOUNTER — Ambulatory Visit: Payer: Medicaid Other | Admitting: Obstetrics and Gynecology

## 2021-03-15 ENCOUNTER — Other Ambulatory Visit: Payer: Self-pay | Admitting: Obstetrics and Gynecology

## 2021-03-15 DIAGNOSIS — A630 Anogenital (venereal) warts: Secondary | ICD-10-CM

## 2021-03-16 DIAGNOSIS — G4733 Obstructive sleep apnea (adult) (pediatric): Secondary | ICD-10-CM | POA: Diagnosis not present

## 2021-03-16 DIAGNOSIS — R0902 Hypoxemia: Secondary | ICD-10-CM | POA: Diagnosis not present

## 2021-03-16 DIAGNOSIS — B20 Human immunodeficiency virus [HIV] disease: Secondary | ICD-10-CM | POA: Diagnosis not present

## 2021-03-16 DIAGNOSIS — R062 Wheezing: Secondary | ICD-10-CM | POA: Diagnosis not present

## 2021-03-18 ENCOUNTER — Other Ambulatory Visit: Payer: Self-pay

## 2021-03-18 ENCOUNTER — Ambulatory Visit (INDEPENDENT_AMBULATORY_CARE_PROVIDER_SITE_OTHER): Payer: Medicaid Other | Admitting: Internal Medicine

## 2021-03-18 ENCOUNTER — Encounter: Payer: Self-pay | Admitting: Internal Medicine

## 2021-03-18 VITALS — BP 138/87 | HR 65 | Temp 98.5°F | Ht 63.0 in | Wt 214.0 lb

## 2021-03-18 DIAGNOSIS — B20 Human immunodeficiency virus [HIV] disease: Secondary | ICD-10-CM

## 2021-03-18 NOTE — Progress Notes (Signed)
RFV: follow up for hiv disease  Patient ID: Leslie Gates, female   DOB: 07-25-1966, 55 y.o.   MRN: [id number]  HPI Leslie Gates is a 55yo F with well controlled HIV disease. CD 4 count 747/undetectable (April 2022) -biktarvy doing well. He has had 2 doses of pfizer, not interested in booster. She stays indoors.   Outpatient Encounter Medications as of 03/18/2021  Medication Sig  . acyclovir (ZOVIRAX) 800 MG tablet Take one 800 mg tablet BID for 5 days.  Marland Kitchen amLODipine (NORVASC) 10 MG tablet Take 1 tablet (10 mg total) by mouth at bedtime.  Marland Kitchen atorvastatin (LIPITOR) 40 MG tablet Take 1 tablet (40 mg total) by mouth daily.  . bictegravir-emtricitabine-tenofovir AF (BIKTARVY) 50-200-25 MG TABS tablet Take 1 tablet by mouth daily.  . cetirizine (ZYRTEC) 10 MG tablet TAKE ONE TABLET BY MOUTH DAILY  . empagliflozin (JARDIANCE) 25 MG TABS tablet Take 1 tablet (25 mg total) by mouth daily.  . furosemide (LASIX) 20 MG tablet TAKE ONE TABLET BY MOUTH EVERY DAY. MAY TAKE EXTRA TABLET EVERY DAY AS NEEDED FOR WEIGHT GAIN 3 LBS OR MORE OVERNIGHT  . metFORMIN (GLUCOPHAGE-XR) 500 MG 24 hr tablet Take 2 tablets (1,000 mg total) by mouth in the morning and at bedtime.  . Multiple Vitamin (TAB-A-VITE) TABS TAKE ONE TABLET BY MOUTH EVERY DAY  . NAPHCON-A 0.025-0.3 % ophthalmic solution INSTILL 1 DROP INTO BOTH EYES FOUR TIMES A DAY AS NEEDED FOR IRRITATION  . neomycin-polymyxin b-dexamethasone (MAXITROL) 3.5-10000-0.1 OINT SMARTSIG:1 Inch(es) In Eye(s) Every Night  . omeprazole (PRILOSEC) 40 MG capsule TAKE ONE CAPSULE BY MOUTH DAILY  . OXYGEN Inhale 2 L into the lungs at bedtime.  Marland Kitchen PROAIR HFA 108 (90 Base) MCG/ACT inhaler INHALE 1 OR 2 PUFFS INTO THE LUNGS EVERY 6 HOURS AS NEEDED FOR WHEEZING OR SHORTNESS OF BREATH  . RESTASIS 0.05 % ophthalmic emulsion 1 drop 2 (two) times daily.  Marland Kitchen spironolactone (ALDACTONE) 25 MG tablet TAKE ONE-HALF OF A TABLET BY MOUTH AT BEDTIME  . SYMBICORT 160-4.5 MCG/ACT inhaler INHALE  2 PUFFS INTO THE LUNGS TWO TIMES DAILY  . cephALEXin (KEFLEX) 500 MG capsule Take 500 mg by mouth 2 (two) times daily. (Patient not taking: Reported on 03/18/2021)  . imiquimod (ALDARA) 5 % cream Apply topically 3 (three) times a week. (Patient not taking: No sig reported)  . methylPREDNISolone (MEDROL DOSEPAK) 4 MG TBPK tablet TAKE 6 TABLETS ON DAY 1 AS DIRECTED ON PACKAGE AND DECREASE BY 1 TAB EACH DAY FOR A TOTAL OF 6 DAYS (Patient not taking: No sig reported)   No facility-administered encounter medications on file as of 03/18/2021.     Patient Active Problem List   Diagnosis Date Noted  . Medication management 01/06/2021  . Genital warts 11/13/2020  . Dysplasia of cervix, low grade (CIN 1) 11/13/2020  . History of herpes genitalis 11/13/2020  . Type 2 diabetes mellitus without complication, without long-term current use of insulin (HCC) 12/15/2019  . O2 dependent 07/22/2018  . Hyperlipidemia 04/29/2018  . Chronic diastolic heart failure (HCC) 06/25/2017  . Moderate dysplasia of cervix 07/10/2013  . History of angioedema 09/09/2011  . Obesity hypoventilation syndrome (HCC) 07/10/2010  . Morbid obesity (HCC) 07/01/2010  . Obstructive sleep apnea 06/27/2010  . Pulmonary hypertension (HCC) 05/28/2010  . Hypertension associated with type 2 diabetes mellitus (HCC) 03/22/2007  . HIV disease (HCC) 06/21/2002     Health Maintenance Due  Topic Date Due  . Fecal DNA (Cologuard)  Never done  .  COVID-19 Vaccine (3 - Pfizer risk 4-dose series) 04/24/2020     Review of Systems  Physical Exam   BP 138/87   Pulse 65   Temp 98.5 F (36.9 C) (Oral)   Ht 5\' 3"  (1.6 m)   Wt 214 lb (97.1 kg)   SpO2 97%   BMI 37.91 kg/m    Lab Results  Component Value Date   CD4TCELL 33 02/20/2021   Lab Results  Component Value Date   CD4TABS 747 02/20/2021   CD4TABS 652 08/27/2020   CD4TABS 909 02/05/2020   Lab Results  Component Value Date   HIV1RNAQUANT Not Detected 02/20/2021   Lab  Results  Component Value Date   HEPBSAB NEG 02/08/2009   Lab Results  Component Value Date   LABRPR NON-REACTIVE 08/27/2020    CBC Lab Results  Component Value Date   WBC 5.9 02/20/2021   RBC 4.54 02/20/2021   HGB 13.6 02/20/2021   HCT 40.8 02/20/2021   PLT 430 (H) 02/20/2021   MCV 89.9 02/20/2021   MCH 30.0 02/20/2021   MCHC 33.3 02/20/2021   RDW 12.0 02/20/2021   LYMPHSABS 2,490 02/20/2021   MONOABS 517 03/30/2017   EOSABS 201 02/20/2021    BMET Lab Results  Component Value Date   NA 137 02/20/2021   K 4.0 02/20/2021   CL 96 (L) 02/20/2021   CO2 29 02/20/2021   GLUCOSE 130 (H) 02/20/2021   BUN 21 02/20/2021   CREATININE 0.81 02/20/2021   CALCIUM 10.4 02/20/2021   GFRNONAA 83 02/20/2021   GFRAA 96 02/20/2021      Assessment and Plan  hiv disease= doing well   T2DM = on multiple medications but patient is feeling overwhelmed with taking all her medications; will follow up with her   htn = well controlled  Anal warts = has had irritation with topical treatment. Followed by ob/gyn for further treatment  Health maintenance = she is being conservatives, stays home, masks in public. Not interested in getting booster.

## 2021-03-21 ENCOUNTER — Other Ambulatory Visit: Payer: Self-pay

## 2021-03-21 ENCOUNTER — Ambulatory Visit: Payer: Medicaid Other | Admitting: Obstetrics and Gynecology

## 2021-03-26 ENCOUNTER — Ambulatory Visit: Payer: Medicaid Other | Admitting: Obstetrics and Gynecology

## 2021-04-04 ENCOUNTER — Other Ambulatory Visit: Payer: Self-pay | Admitting: Family Medicine

## 2021-04-04 ENCOUNTER — Ambulatory Visit: Payer: Medicaid Other | Admitting: Obstetrics and Gynecology

## 2021-04-04 DIAGNOSIS — I5032 Chronic diastolic (congestive) heart failure: Secondary | ICD-10-CM

## 2021-04-16 DIAGNOSIS — B20 Human immunodeficiency virus [HIV] disease: Secondary | ICD-10-CM | POA: Diagnosis not present

## 2021-04-16 DIAGNOSIS — R062 Wheezing: Secondary | ICD-10-CM | POA: Diagnosis not present

## 2021-04-16 DIAGNOSIS — R0902 Hypoxemia: Secondary | ICD-10-CM | POA: Diagnosis not present

## 2021-04-16 DIAGNOSIS — G4733 Obstructive sleep apnea (adult) (pediatric): Secondary | ICD-10-CM | POA: Diagnosis not present

## 2021-05-16 ENCOUNTER — Other Ambulatory Visit: Payer: Self-pay | Admitting: Obstetrics and Gynecology

## 2021-05-16 DIAGNOSIS — R0902 Hypoxemia: Secondary | ICD-10-CM | POA: Diagnosis not present

## 2021-05-16 DIAGNOSIS — B20 Human immunodeficiency virus [HIV] disease: Secondary | ICD-10-CM | POA: Diagnosis not present

## 2021-05-16 DIAGNOSIS — G4733 Obstructive sleep apnea (adult) (pediatric): Secondary | ICD-10-CM | POA: Diagnosis not present

## 2021-05-16 DIAGNOSIS — N898 Other specified noninflammatory disorders of vagina: Secondary | ICD-10-CM

## 2021-05-16 DIAGNOSIS — R062 Wheezing: Secondary | ICD-10-CM | POA: Diagnosis not present

## 2021-06-13 ENCOUNTER — Other Ambulatory Visit: Payer: Self-pay | Admitting: Obstetrics and Gynecology

## 2021-06-13 DIAGNOSIS — N898 Other specified noninflammatory disorders of vagina: Secondary | ICD-10-CM

## 2021-06-16 DIAGNOSIS — G4733 Obstructive sleep apnea (adult) (pediatric): Secondary | ICD-10-CM | POA: Diagnosis not present

## 2021-06-16 DIAGNOSIS — R0902 Hypoxemia: Secondary | ICD-10-CM | POA: Diagnosis not present

## 2021-06-16 DIAGNOSIS — B20 Human immunodeficiency virus [HIV] disease: Secondary | ICD-10-CM | POA: Diagnosis not present

## 2021-06-16 DIAGNOSIS — R062 Wheezing: Secondary | ICD-10-CM | POA: Diagnosis not present

## 2021-07-02 ENCOUNTER — Ambulatory Visit: Payer: Medicaid Other | Admitting: Acute Care

## 2021-07-17 DIAGNOSIS — B20 Human immunodeficiency virus [HIV] disease: Secondary | ICD-10-CM | POA: Diagnosis not present

## 2021-07-17 DIAGNOSIS — R062 Wheezing: Secondary | ICD-10-CM | POA: Diagnosis not present

## 2021-07-17 DIAGNOSIS — G4733 Obstructive sleep apnea (adult) (pediatric): Secondary | ICD-10-CM | POA: Diagnosis not present

## 2021-07-17 DIAGNOSIS — R0902 Hypoxemia: Secondary | ICD-10-CM | POA: Diagnosis not present

## 2021-07-23 ENCOUNTER — Other Ambulatory Visit: Payer: Self-pay | Admitting: Family Medicine

## 2021-07-23 DIAGNOSIS — I5032 Chronic diastolic (congestive) heart failure: Secondary | ICD-10-CM

## 2021-07-23 DIAGNOSIS — E118 Type 2 diabetes mellitus with unspecified complications: Secondary | ICD-10-CM

## 2021-08-16 DIAGNOSIS — R062 Wheezing: Secondary | ICD-10-CM | POA: Diagnosis not present

## 2021-08-16 DIAGNOSIS — R0902 Hypoxemia: Secondary | ICD-10-CM | POA: Diagnosis not present

## 2021-08-16 DIAGNOSIS — B20 Human immunodeficiency virus [HIV] disease: Secondary | ICD-10-CM | POA: Diagnosis not present

## 2021-08-16 DIAGNOSIS — G4733 Obstructive sleep apnea (adult) (pediatric): Secondary | ICD-10-CM | POA: Diagnosis not present

## 2021-08-20 ENCOUNTER — Telehealth: Payer: Self-pay | Admitting: Pulmonary Disease

## 2021-08-20 DIAGNOSIS — G4733 Obstructive sleep apnea (adult) (pediatric): Secondary | ICD-10-CM | POA: Diagnosis not present

## 2021-08-20 NOTE — Telephone Encounter (Signed)
Noted.   Craige Cotta returns to the office 08/16/2021.   Will route message to Sood/Nurse to f/u upon his return.

## 2021-08-20 NOTE — Telephone Encounter (Signed)
Faxed form to [place] to be filled out by Dr. Craige Cotta and Baron Hamper will fax it back to me

## 2021-08-20 NOTE — Telephone Encounter (Signed)
Patient is returning phone call. Patient phone number is [phone].

## 2021-08-20 NOTE — Telephone Encounter (Signed)
Form faxed back to [facility] at [place] office.

## 2021-08-20 NOTE — Telephone Encounter (Signed)
Form completed and given to Meagan in [place] office.

## 2021-08-21 ENCOUNTER — Telehealth: Payer: Self-pay | Admitting: Pulmonary Disease

## 2021-08-21 NOTE — Telephone Encounter (Signed)
Called and spoke with patient she is picking up form today or tomorrow. Nothing further needed at this time.

## 2021-08-21 NOTE — Telephone Encounter (Signed)
Called and spoke with patient to let her know that I have her paperwork ready. Advised her that Dr. Craige Cotta is not here this week and that's why it took a bit to get back to her. Asked her if she wants to pick it up or mail it. She said she would pick it up. Advised her that I will place it up front. Nothing further needed at this time.

## 2021-08-25 ENCOUNTER — Encounter: Payer: Self-pay | Admitting: Pulmonary Disease

## 2021-08-25 ENCOUNTER — Other Ambulatory Visit: Payer: Self-pay

## 2021-08-25 ENCOUNTER — Ambulatory Visit (INDEPENDENT_AMBULATORY_CARE_PROVIDER_SITE_OTHER): Payer: Medicaid Other | Admitting: Pulmonary Disease

## 2021-08-25 ENCOUNTER — Ambulatory Visit (INDEPENDENT_AMBULATORY_CARE_PROVIDER_SITE_OTHER): Payer: Medicaid Other

## 2021-08-25 VITALS — BP 122/80 | HR 75 | Temp 98.1°F | Ht 63.0 in | Wt 211.0 lb

## 2021-08-25 DIAGNOSIS — R059 Cough, unspecified: Secondary | ICD-10-CM | POA: Diagnosis not present

## 2021-08-25 DIAGNOSIS — E662 Morbid (severe) obesity with alveolar hypoventilation: Secondary | ICD-10-CM | POA: Diagnosis not present

## 2021-08-25 DIAGNOSIS — G4733 Obstructive sleep apnea (adult) (pediatric): Secondary | ICD-10-CM

## 2021-08-25 DIAGNOSIS — J4550 Severe persistent asthma, uncomplicated: Secondary | ICD-10-CM | POA: Diagnosis not present

## 2021-08-25 MED ORDER — BREZTRI AEROSPHERE 160-9-4.8 MCG/ACT IN AERO: 160.0000 ug | INHALATION_SPRAY | Freq: Two times a day (BID) | RESPIRATORY_TRACT | 0 refills | Status: DC

## 2021-08-25 NOTE — Addendum Note (Signed)
Addended by: Lavona Mound on: 08/25/2021 02:30 PM   Modules accepted: Orders

## 2021-08-25 NOTE — Progress Notes (Signed)
[facility]  Chief Complaint  Patient presents with   Follow-up    On cpap- OSA     Past Surgical History:  She  has a past surgical history that includes Tympanostomy tube placement; Cesarean section; Nasal sinus surgery (2004); Wrist fracture surgery (2006); Adenoidectomy; and right heart catheterization (N/A, 02/01/2012).  Past Medical History:  Allergies, Anemia, Anxiety, OA, CAD, Depression, Diastolic CHF, GERD, HTN, HIV  Constitutional:  BP 122/80 (BP Location: Left Arm, Patient Position: Sitting, Cuff Size: Normal)   Pulse 75   Temp 98.1 F (36.7 C) (Oral)   Ht 5\' 3"  (1.6 m)   Wt 211 lb (95.7 kg)   SpO2 94%   BMI 37.38 kg/m   Brief Summary:  Leslie Gates is a 55 y.o. female with obstructive sleep apnea, obesity hypoventilation syndrome, and asthma.      Subjective:   I last saw her in 2019.  More recently she has been seeing the nurse practitioners.  She developed a cough about 3 weeks ago that has persisted.  She gets more of a cough in certain environments.  She sometimes brings up clear sputum.  Not having wheeze, chest tightness, hemoptysis, or fever.  Has been using albuterol more often and this helps.  Uses CPAP nightly.  No issues with mask fit.  Physical Exam:   Appearance - well kempt   ENMT - no sinus tenderness, no oral exudate, no LAN, Mallampati 3 airway, no stridor  Respiratory - equal breath sounds bilaterally, no wheezing or rales  CV - s1s2 regular rate and rhythm, no murmurs  Ext - no clubbing, no edema  Skin - no rashes  Psych - normal mood and affect   Pulmonary testing:  PFT 01/29/14 >> FEV1 1.79 (81%), FEV1% 86, TLC 3.47 (74%), DLCO 71% PFT 04/29/15 >> FEV1 1.53 (66%), FEV1% 80, TLC 3.74 (76%), DLCO 46%  Sleep Tests:  PSG 06/08/04 >> RDI 92.1, oxygen nadir 57% ONO on RA 08/03/13 >> 203 min with SpO2 < 88% Split night 12/22/13 >> AHI 27, SaO2 low 77%. CPAP 12 cm H2O >> AHI 5.9, +R,  +S. Did not need supplemental oxygen. ONO with CPAP and 2 liters 02/13/14 >> Test time 8 hrs 36 min. Basal SpO2 98%, low SpO2 63% (artifact). Spent 0.4 min with SpO2 < 88%.  CPAP 05/24/21 to 08/21/21 >> used on 88 of 90 nights with average 8 hrs 3 min.  Average AHI 3.2 with CPAP 12 cm H2O  Cardiac Tests:  Echo 05/10/18 >> EF 60 to 65%  Social History:  She  reports that she has never smoked. She has never used smokeless tobacco. She reports that she does not drink alcohol and does not use drugs.  Family History:  Her family history includes Alcohol abuse in her brother, father, mother, and sister; Hyperlipidemia in her mother; Hypertension in her brother, father, mother, sister, and another family member; Kidney disease in her father; Other in her father; Other (age of onset: 69) in her mother.     Assessment/Plan:   Allergic asthma and rhinitis. - has progressive symptoms - will have her try sample of breztri in place of symbicort - chest xray today - prn albuterol - prn OTC antihistamine   Obstructive sleep apnea. - she is compliant with CPAP and reports benefit from therapy - uses [employer] for her DME - continue CPAP 12 cm H2O   Chronic respiratory failure 2nd to obesity hypoventilation syndrome. - continue 2  liters supplemental oxygen at night with CPAP  Dyspnea. - she is unable to walk more than 200 feet without having to stop and rest - handicap parking form completed    Obesity. - discussed options to assist with weight loss  HIV. - followed by Dr. Judyann Munson with [facility] for Infectious Disease  Time Spent Involved in Patient Care on Day of Examination:  36 minutes  Follow up:   Patient Instructions  Will have you try sample of breztri two puffs in the morning and two puffs in the evening, and rinse your mouth after each use.  Call or send email to get refill if you feel like breztri is helping.  Don't use symbicort while using breztri.  Chest xray  today.  Follow up in 2 months.  Medication List:   Allergies as of 08/25/2021       Reactions   Lisinopril Swelling   Very swollen bottom lip        Medication List        Accurate as of August 25, 2021  2:21 PM. If you have any questions, ask your nurse or doctor.          acyclovir 800 MG tablet Commonly known as: ZOVIRAX Take one 800 mg tablet BID for 5 days.   amLODipine 10 MG tablet Commonly known as: NORVASC Take 1 tablet (10 mg total) by mouth at bedtime.   atorvastatin 40 MG tablet Commonly known as: LIPITOR Take 1 tablet (40 mg total) by mouth daily.   bictegravir-emtricitabine-tenofovir AF 50-200-25 MG Tabs tablet Commonly known as: BIKTARVY Take 1 tablet by mouth daily.   cetirizine 10 MG tablet Commonly known as: ZYRTEC TAKE ONE TABLET BY MOUTH DAILY   fluconazole 150 MG tablet Commonly known as: DIFLUCAN TAKE ONE TABLET BY MOUTH ONCE (CAN TAKE ADDITIONAL tablet THREE DAYS LATER IF still have SYMPTOMS)   furosemide 20 MG tablet Commonly known as: LASIX TAKE ONE TABLET BY MOUTH EVERY DAY. MAY TAKE EXTRA TABLET EVERY DAY AS NEEDED FOR WEIGHT GAIN 3 LBS OR MORE OVERNIGHT   Jardiance 25 MG Tabs tablet Generic drug: empagliflozin TAKE ONE TABLET BY MOUTH EVERY DAY   metFORMIN 500 MG 24 hr tablet Commonly known as: GLUCOPHAGE-XR Take 2 tablets (1,000 mg total) by mouth in the morning and at bedtime.   Naphcon-A 0.025-0.3 % ophthalmic solution Generic drug: naphazoline-pheniramine INSTILL 1 DROP INTO BOTH EYES FOUR TIMES A DAY AS NEEDED FOR IRRITATION   neomycin-polymyxin b-dexamethasone 3.5-10000-0.1 Oint Commonly known as: MAXITROL SMARTSIG:1 Inch(es) In Eye(s) Every Night   omeprazole 40 MG capsule Commonly known as: PRILOSEC TAKE ONE CAPSULE BY MOUTH DAILY   OXYGEN Inhale 2 L into the lungs at bedtime.   ProAir HFA 108 (90 Base) MCG/ACT inhaler Generic drug: albuterol INHALE 1 OR 2 PUFFS INTO THE LUNGS EVERY 6 HOURS AS NEEDED FOR  WHEEZING OR SHORTNESS OF BREATH   Restasis 0.05 % ophthalmic emulsion Generic drug: cycloSPORINE 1 drop 2 (two) times daily.   spironolactone 25 MG tablet Commonly known as: ALDACTONE TAKE ONE-HALF OF A TABLET BY MOUTH AT BEDTIME   Symbicort 160-4.5 MCG/ACT inhaler Generic drug: budesonide-formoterol INHALE 2 PUFFS INTO THE LUNGS TWO TIMES DAILY   Tab-A-Vite Tabs TAKE ONE TABLET BY MOUTH EVERY DAY        Signature:  Coralyn Helling, MD [facility] Pager - [phone] 08/25/2021, 2:21 PM

## 2021-08-25 NOTE — Patient Instructions (Signed)
Will have you try sample of breztri two puffs in the morning and two puffs in the evening, and rinse your mouth after each use.  Call or send email to get refill if you feel like breztri is helping.  Don't use symbicort while using breztri.  Chest xray today.  Follow up in 2 months.

## 2021-09-10 ENCOUNTER — Other Ambulatory Visit: Payer: Self-pay

## 2021-09-10 DIAGNOSIS — B20 Human immunodeficiency virus [HIV] disease: Secondary | ICD-10-CM

## 2021-09-10 DIAGNOSIS — Z113 Encounter for screening for infections with a predominantly sexual mode of transmission: Secondary | ICD-10-CM

## 2021-09-10 DIAGNOSIS — Z79899 Other long term (current) drug therapy: Secondary | ICD-10-CM

## 2021-09-11 ENCOUNTER — Other Ambulatory Visit: Payer: Self-pay | Admitting: Family Medicine

## 2021-09-15 ENCOUNTER — Other Ambulatory Visit: Payer: Medicaid Other

## 2021-09-16 DIAGNOSIS — B20 Human immunodeficiency virus [HIV] disease: Secondary | ICD-10-CM | POA: Diagnosis not present

## 2021-09-16 DIAGNOSIS — R0902 Hypoxemia: Secondary | ICD-10-CM | POA: Diagnosis not present

## 2021-09-16 DIAGNOSIS — R062 Wheezing: Secondary | ICD-10-CM | POA: Diagnosis not present

## 2021-09-16 DIAGNOSIS — G4733 Obstructive sleep apnea (adult) (pediatric): Secondary | ICD-10-CM | POA: Diagnosis not present

## 2021-09-29 ENCOUNTER — Encounter: Payer: Self-pay | Admitting: Internal Medicine

## 2021-09-29 ENCOUNTER — Other Ambulatory Visit: Payer: Self-pay

## 2021-09-29 ENCOUNTER — Ambulatory Visit (INDEPENDENT_AMBULATORY_CARE_PROVIDER_SITE_OTHER): Payer: Medicaid Other | Admitting: Internal Medicine

## 2021-09-29 ENCOUNTER — Ambulatory Visit: Payer: Medicaid Other | Admitting: Obstetrics and Gynecology

## 2021-09-29 VITALS — BP 151/100 | HR 69 | Resp 16 | Ht 63.0 in | Wt 209.6 lb

## 2021-09-29 DIAGNOSIS — J449 Chronic obstructive pulmonary disease, unspecified: Secondary | ICD-10-CM | POA: Diagnosis not present

## 2021-09-29 DIAGNOSIS — I10 Essential (primary) hypertension: Secondary | ICD-10-CM

## 2021-09-29 DIAGNOSIS — E119 Type 2 diabetes mellitus without complications: Secondary | ICD-10-CM | POA: Diagnosis not present

## 2021-09-29 DIAGNOSIS — B20 Human immunodeficiency virus [HIV] disease: Secondary | ICD-10-CM

## 2021-09-29 NOTE — Progress Notes (Signed)
RFV: follow up for hiv disease  Patient ID: Leslie Gates, female   DOB: 1966/02/16, 55 y.o.   MRN: [id number]  HPI Leslie Gates is a 55yo F with well controlled hiv disease but also has HTN, HLD, and DM. She reports difficulty taking her DM medications regularly.she is also on diuretic for which she takes at night. Often feels parch, and drinking water, frequent urination.  Has not had covid.  Outpatient Encounter Medications as of 09/29/2021  Medication Sig   acyclovir (ZOVIRAX) 800 MG tablet Take one 800 mg tablet BID for 5 days.   amLODipine (NORVASC) 10 MG tablet Take 1 tablet (10 mg total) by mouth at bedtime.   atorvastatin (LIPITOR) 40 MG tablet Take 1 tablet (40 mg total) by mouth daily.   bictegravir-emtricitabine-tenofovir AF (BIKTARVY) 50-200-25 MG TABS tablet Take 1 tablet by mouth daily.   Budeson-Glycopyrrol-Formoterol (BREZTRI AEROSPHERE) 160-9-4.8 MCG/ACT AERO Inhale 160 mcg into the lungs 2 (two) times daily.   cetirizine (ZYRTEC) 10 MG tablet TAKE ONE TABLET BY MOUTH DAILY   fluconazole (DIFLUCAN) 150 MG tablet TAKE ONE TABLET BY MOUTH ONCE (CAN TAKE ADDITIONAL tablet THREE DAYS LATER IF still have SYMPTOMS)   furosemide (LASIX) 20 MG tablet TAKE ONE TABLET BY MOUTH EVERY DAY. MAY TAKE EXTRA TABLET EVERY DAY AS NEEDED FOR WEIGHT GAIN 3 LBS OR MORE OVERNIGHT   JARDIANCE 25 MG TABS tablet TAKE ONE TABLET BY MOUTH EVERY DAY   metFORMIN (GLUCOPHAGE-XR) 500 MG 24 hr tablet Take 2 tablets (1,000 mg total) by mouth in the morning and at bedtime.   Multiple Vitamin (TAB-A-VITE) TABS TAKE ONE TABLET BY MOUTH EVERY DAY   NAPHCON-A 0.025-0.3 % ophthalmic solution INSTILL 1 DROP INTO BOTH EYES FOUR TIMES A DAY AS NEEDED FOR IRRITATION   neomycin-polymyxin b-dexamethasone (MAXITROL) 3.5-10000-0.1 OINT SMARTSIG:1 Inch(es) In Eye(s) Every Night   omeprazole (PRILOSEC) 40 MG capsule TAKE ONE CAPSULE BY MOUTH DAILY   OXYGEN Inhale 2 L into the lungs at bedtime.   PROAIR HFA 108 (90 Base)  MCG/ACT inhaler INHALE 1 OR 2 PUFFS INTO THE LUNGS EVERY 6 HOURS AS NEEDED FOR WHEEZING OR SHORTNESS OF BREATH   RESTASIS 0.05 % ophthalmic emulsion 1 drop 2 (two) times daily.   spironolactone (ALDACTONE) 25 MG tablet TAKE ONE-HALF OF A TABLET BY MOUTH AT BEDTIME   SYMBICORT 160-4.5 MCG/ACT inhaler INHALE 2 PUFFS INTO THE LUNGS TWO TIMES DAILY   No facility-administered encounter medications on file as of 09/29/2021.     Patient Active Problem List   Diagnosis Date Noted   Medication management 01/06/2021   Genital warts 11/13/2020   Dysplasia of cervix, low grade (CIN 1) 11/13/2020   History of herpes genitalis 11/13/2020   Type 2 diabetes mellitus without complication, without long-term current use of insulin (HCC) 12/15/2019   O2 dependent 07/22/2018   Hyperlipidemia 04/29/2018   Chronic diastolic heart failure (HCC) 06/25/2017   Moderate dysplasia of cervix 07/10/2013   History of angioedema 09/09/2011   Obesity hypoventilation syndrome (HCC) 07/10/2010   Morbid obesity (HCC) 07/01/2010   Obstructive sleep apnea 06/27/2010   Pulmonary hypertension (HCC) 05/28/2010   Hypertension associated with type 2 diabetes mellitus (HCC) 03/22/2007   HIV disease (HCC) 06/21/2002     Health Maintenance Due  Topic Date Due   Zoster Vaccines- Shingrix (1 of 2) Never done   Fecal DNA (Cologuard)  Never done   COVID-19 Vaccine (3 - Pfizer risk series) 04/24/2020   HEMOGLOBIN A1C  07/06/2021   FOOT  EXAM  09/02/2021   PAP SMEAR-Modifier  11/13/2021     Review of Systems Per hpi. No fever/chills/nightsweats Physical Exam   BP (!) 151/100   Pulse 69   Resp 16   Ht 5\' 3"  (1.6 m)   Wt 209 lb 9.6 oz (95.1 kg)   LMP 09/18/2021   SpO2 96%   BMI 37.13 kg/m   Physical Exam  Constitutional:  oriented to person, place, and time. appears well-developed and well-nourished. No distress.  HENT: [place]/AT, PERRLA, no scleral icterus Mouth/Throat: Oropharynx is clear and moist. No oropharyngeal  exudate.  Cardiovascular: Normal rate, regular rhythm and normal heart sounds. Exam reveals no gallop and no friction rub.  No murmur heard.  Pulmonary/Chest: Effort normal and breath sounds normal. No respiratory distress.  has no wheezes.  Neck = supple, no nuchal rigidity Abdominal: Soft. Bowel sounds are normal.  exhibits no distension. There is no tenderness.  Lymphadenopathy: no cervical adenopathy. No axillary adenopathy Neurological: alert and oriented to person, place, and time.  Skin: Skin is warm and dry. No rash noted. No erythema.  Psychiatric: a normal mood and affect.  behavior is normal.   Lab Results  Component Value Date   CD4TCELL 33 02/20/2021   Lab Results  Component Value Date   CD4TABS 747 02/20/2021   CD4TABS 652 08/27/2020   CD4TABS 909 02/05/2020   Lab Results  Component Value Date   HIV1RNAQUANT Not Detected 02/20/2021   Lab Results  Component Value Date   HEPBSAB NEG 02/08/2009   Lab Results  Component Value Date   LABRPR NON-REACTIVE 08/27/2020    CBC Lab Results  Component Value Date   WBC 5.9 02/20/2021   RBC 4.54 02/20/2021   HGB 13.6 02/20/2021   HCT 40.8 02/20/2021   PLT 430 (H) 02/20/2021   MCV 89.9 02/20/2021   MCH 30.0 02/20/2021   MCHC 33.3 02/20/2021   RDW 12.0 02/20/2021   LYMPHSABS 2,490 02/20/2021   MONOABS 517 03/30/2017   EOSABS 201 02/20/2021    BMET Lab Results  Component Value Date   NA 137 02/20/2021   K 4.0 02/20/2021   CL 96 (L) 02/20/2021   CO2 29 02/20/2021   GLUCOSE 130 (H) 02/20/2021   BUN 21 02/20/2021   CREATININE 0.81 02/20/2021   CALCIUM 10.4 02/20/2021   GFRNONAA 83 02/20/2021   GFRAA 96 02/20/2021      Assessment and Plan  Hiv disease= takes every day. Will check labs  T2DM = misses 3 doses last week. She feels too many pills. Will check hgba1c -- addendum at hgba1c at 14 - will likely need injectables. To leave message with patient  Frequent urination = multifactorial, could be from  DM as well as diuretics but she takes it at night.discussed to change to diuretics to [place]  Htn = poorly controlled. Sees pcp this week for adjustment  COPD= dr 04/22/2021 recently changed her to different inhaler which has helped.  Health maintenance = deferring the flu vaccine

## 2021-09-30 LAB — T-HELPER CELL (CD4) - (RCID CLINIC ONLY)
CD4 % Helper T Cell: 33 % (ref 33–65)
CD4 T Cell Abs: 468 /uL (ref 400–1790)

## 2021-10-01 ENCOUNTER — Other Ambulatory Visit: Payer: Self-pay | Admitting: Family Medicine

## 2021-10-01 ENCOUNTER — Ambulatory Visit: Payer: Medicaid Other | Admitting: Obstetrics & Gynecology

## 2021-10-01 DIAGNOSIS — I5032 Chronic diastolic (congestive) heart failure: Secondary | ICD-10-CM

## 2021-10-01 LAB — CBC WITH DIFFERENTIAL/PLATELET
Absolute Monocytes: 291 cells/uL (ref 200–950)
Basophils Absolute: 49 cells/uL (ref 0–200)
Basophils Relative: 1.4 %
Eosinophils Absolute: 102 cells/uL (ref 15–500)
Eosinophils Relative: 2.9 %
HCT: 40.7 % (ref 35.0–45.0)
Hemoglobin: 13.5 g/dL (ref 11.7–15.5)
Lymphs Abs: 1764 cells/uL (ref 850–3900)
MCH: 30.1 pg (ref 27.0–33.0)
MCHC: 33.2 g/dL (ref 32.0–36.0)
MCV: 90.8 fL (ref 80.0–100.0)
MPV: 10.8 fL (ref 7.5–12.5)
Monocytes Relative: 8.3 %
Neutro Abs: 1295 cells/uL — ABNORMAL LOW (ref 1500–7800)
Neutrophils Relative %: 37 %
Platelets: 280 10*3/uL (ref 140–400)
RBC: 4.48 10*6/uL (ref 3.80–5.10)
RDW: 12.5 % (ref 11.0–15.0)
Total Lymphocyte: 50.4 %
WBC: 3.5 10*3/uL — ABNORMAL LOW (ref 3.8–10.8)

## 2021-10-01 LAB — COMPLETE METABOLIC PANEL WITH GFR
AG Ratio: 1.4 (calc) (ref 1.0–2.5)
ALT: 20 U/L (ref 6–29)
AST: 19 U/L (ref 10–35)
Albumin: 4.2 g/dL (ref 3.6–5.1)
Alkaline phosphatase (APISO): 88 U/L (ref 37–153)
BUN: 10 mg/dL (ref 7–25)
CO2: 29 mmol/L (ref 20–32)
Calcium: 9.8 mg/dL (ref 8.6–10.4)
Chloride: 98 mmol/L (ref 98–110)
Creat: 0.75 mg/dL (ref 0.50–1.03)
Globulin: 3 g/dL (calc) (ref 1.9–3.7)
Glucose, Bld: 332 mg/dL — ABNORMAL HIGH (ref 65–99)
Potassium: 4.9 mmol/L (ref 3.5–5.3)
Sodium: 136 mmol/L (ref 135–146)
Total Bilirubin: 0.4 mg/dL (ref 0.2–1.2)
Total Protein: 7.2 g/dL (ref 6.1–8.1)
eGFR: 95 mL/min/{1.73_m2} (ref >=60)

## 2021-10-01 LAB — HIV-1 RNA QUANT-NO REFLEX-BLD
HIV 1 RNA Quant: NOT DETECTED Copies/mL
HIV-1 RNA Quant, Log: NOT DETECTED Log cps/mL

## 2021-10-01 LAB — HEMOGLOBIN A1C: Hgb A1c MFr Bld: >14 % of total Hgb — ABNORMAL HIGH (ref <5.7)

## 2021-10-03 ENCOUNTER — Ambulatory Visit: Payer: Medicaid Other | Admitting: Family Medicine

## 2021-10-16 DIAGNOSIS — R0902 Hypoxemia: Secondary | ICD-10-CM | POA: Diagnosis not present

## 2021-10-16 DIAGNOSIS — G4733 Obstructive sleep apnea (adult) (pediatric): Secondary | ICD-10-CM | POA: Diagnosis not present

## 2021-10-16 DIAGNOSIS — R062 Wheezing: Secondary | ICD-10-CM | POA: Diagnosis not present

## 2021-10-16 DIAGNOSIS — B20 Human immunodeficiency virus [HIV] disease: Secondary | ICD-10-CM | POA: Diagnosis not present

## 2021-10-21 ENCOUNTER — Ambulatory Visit: Payer: Medicaid Other | Admitting: Obstetrics and Gynecology

## 2021-11-05 ENCOUNTER — Other Ambulatory Visit: Payer: Self-pay | Admitting: Family Medicine

## 2021-11-11 ENCOUNTER — Other Ambulatory Visit: Payer: Self-pay | Admitting: Family Medicine

## 2021-11-11 DIAGNOSIS — I10 Essential (primary) hypertension: Secondary | ICD-10-CM

## 2021-11-16 DIAGNOSIS — B20 Human immunodeficiency virus [HIV] disease: Secondary | ICD-10-CM | POA: Diagnosis not present

## 2021-11-16 DIAGNOSIS — R062 Wheezing: Secondary | ICD-10-CM | POA: Diagnosis not present

## 2021-11-16 DIAGNOSIS — G4733 Obstructive sleep apnea (adult) (pediatric): Secondary | ICD-10-CM | POA: Diagnosis not present

## 2021-11-16 DIAGNOSIS — R0902 Hypoxemia: Secondary | ICD-10-CM | POA: Diagnosis not present

## 2021-12-03 ENCOUNTER — Other Ambulatory Visit: Payer: Self-pay | Admitting: Family Medicine

## 2021-12-03 DIAGNOSIS — E118 Type 2 diabetes mellitus with unspecified complications: Secondary | ICD-10-CM

## 2021-12-03 NOTE — Telephone Encounter (Signed)
Patient has not been seen at [facility] since February 2022. Requested refill for jardiance. Upon review of chart, patient had a1c done elsewhere that was elevated in November 22 to 14%. Patient needs prompt follow up care for diabetes. I called patient today and left HIPPA compliant VM for patient. If she returns call, please have her make an appt ASAP. She cannot receive more refills for jardiance until she is seen in clinic and has labs.  Gladys Damme, MD [facility], PGY-3

## 2021-12-04 NOTE — Telephone Encounter (Signed)
Attempted to reach patient. No answer. LVM for patient to call the office to make follow up with Dr. Leary Roca. Aquilla Solian, CMA

## 2021-12-17 DIAGNOSIS — B20 Human immunodeficiency virus [HIV] disease: Secondary | ICD-10-CM | POA: Diagnosis not present

## 2021-12-17 DIAGNOSIS — R0902 Hypoxemia: Secondary | ICD-10-CM | POA: Diagnosis not present

## 2021-12-17 DIAGNOSIS — G4733 Obstructive sleep apnea (adult) (pediatric): Secondary | ICD-10-CM | POA: Diagnosis not present

## 2021-12-17 DIAGNOSIS — R062 Wheezing: Secondary | ICD-10-CM | POA: Diagnosis not present

## 2021-12-31 ENCOUNTER — Other Ambulatory Visit: Payer: Self-pay | Admitting: Family Medicine

## 2021-12-31 ENCOUNTER — Encounter: Payer: Self-pay | Admitting: Family Medicine

## 2021-12-31 ENCOUNTER — Other Ambulatory Visit: Payer: Self-pay | Admitting: Internal Medicine

## 2021-12-31 DIAGNOSIS — I5032 Chronic diastolic (congestive) heart failure: Secondary | ICD-10-CM

## 2021-12-31 DIAGNOSIS — E118 Type 2 diabetes mellitus with unspecified complications: Secondary | ICD-10-CM

## 2022-01-14 DIAGNOSIS — G4733 Obstructive sleep apnea (adult) (pediatric): Secondary | ICD-10-CM | POA: Diagnosis not present

## 2022-01-14 DIAGNOSIS — R062 Wheezing: Secondary | ICD-10-CM | POA: Diagnosis not present

## 2022-01-14 DIAGNOSIS — B20 Human immunodeficiency virus [HIV] disease: Secondary | ICD-10-CM | POA: Diagnosis not present

## 2022-01-14 DIAGNOSIS — R0902 Hypoxemia: Secondary | ICD-10-CM | POA: Diagnosis not present

## 2022-01-20 ENCOUNTER — Other Ambulatory Visit: Payer: Self-pay | Admitting: Family Medicine

## 2022-01-20 DIAGNOSIS — E118 Type 2 diabetes mellitus with unspecified complications: Secondary | ICD-10-CM

## 2022-01-29 ENCOUNTER — Other Ambulatory Visit: Payer: Self-pay

## 2022-01-29 MED ORDER — ATORVASTATIN CALCIUM 40 MG PO TABS: 40.0000 mg | ORAL_TABLET | Freq: Every day | ORAL | 0 refills | Status: DC

## 2022-01-29 MED ORDER — METFORMIN HCL ER 500 MG PO TB24: 1000.0000 mg | ORAL_TABLET | Freq: Two times a day (BID) | ORAL | 0 refills | Status: DC

## 2022-01-29 MED ORDER — AMLODIPINE BESYLATE 10 MG PO TABS: 10.0000 mg | ORAL_TABLET | Freq: Every day | ORAL | 0 refills | Status: DC

## 2022-02-14 ENCOUNTER — Other Ambulatory Visit: Payer: Self-pay | Admitting: Family Medicine

## 2022-02-14 DIAGNOSIS — E1159 Type 2 diabetes mellitus with other circulatory complications: Secondary | ICD-10-CM

## 2022-02-14 DIAGNOSIS — E118 Type 2 diabetes mellitus with unspecified complications: Secondary | ICD-10-CM

## 2022-02-14 DIAGNOSIS — E782 Mixed hyperlipidemia: Secondary | ICD-10-CM

## 2022-02-14 DIAGNOSIS — B20 Human immunodeficiency virus [HIV] disease: Secondary | ICD-10-CM | POA: Diagnosis not present

## 2022-02-14 DIAGNOSIS — R062 Wheezing: Secondary | ICD-10-CM | POA: Diagnosis not present

## 2022-02-14 DIAGNOSIS — R0902 Hypoxemia: Secondary | ICD-10-CM | POA: Diagnosis not present

## 2022-02-14 DIAGNOSIS — G4733 Obstructive sleep apnea (adult) (pediatric): Secondary | ICD-10-CM | POA: Diagnosis not present

## 2022-02-17 ENCOUNTER — Encounter: Payer: Self-pay | Admitting: Family Medicine

## 2022-03-11 ENCOUNTER — Other Ambulatory Visit: Payer: Self-pay | Admitting: Family Medicine

## 2022-03-12 ENCOUNTER — Other Ambulatory Visit: Payer: Self-pay | Admitting: Family Medicine

## 2022-03-12 DIAGNOSIS — E118 Type 2 diabetes mellitus with unspecified complications: Secondary | ICD-10-CM

## 2022-03-12 DIAGNOSIS — E782 Mixed hyperlipidemia: Secondary | ICD-10-CM

## 2022-03-13 ENCOUNTER — Ambulatory Visit (INDEPENDENT_AMBULATORY_CARE_PROVIDER_SITE_OTHER): Payer: Medicaid Other | Admitting: Family Medicine

## 2022-03-13 ENCOUNTER — Encounter: Payer: Self-pay | Admitting: Family Medicine

## 2022-03-13 VITALS — BP 130/86 | HR 68 | Ht 63.0 in | Wt 179.4 lb

## 2022-03-13 DIAGNOSIS — E119 Type 2 diabetes mellitus without complications: Secondary | ICD-10-CM

## 2022-03-13 DIAGNOSIS — Z1329 Encounter for screening for other suspected endocrine disorder: Secondary | ICD-10-CM | POA: Diagnosis not present

## 2022-03-13 DIAGNOSIS — G4733 Obstructive sleep apnea (adult) (pediatric): Secondary | ICD-10-CM | POA: Diagnosis not present

## 2022-03-13 DIAGNOSIS — E669 Obesity, unspecified: Secondary | ICD-10-CM | POA: Diagnosis not present

## 2022-03-13 DIAGNOSIS — E1165 Type 2 diabetes mellitus with hyperglycemia: Secondary | ICD-10-CM | POA: Diagnosis not present

## 2022-03-13 DIAGNOSIS — T7840XA Allergy, unspecified, initial encounter: Secondary | ICD-10-CM | POA: Diagnosis not present

## 2022-03-13 DIAGNOSIS — Z113 Encounter for screening for infections with a predominantly sexual mode of transmission: Secondary | ICD-10-CM

## 2022-03-13 DIAGNOSIS — E66811 Obesity, class 1: Secondary | ICD-10-CM

## 2022-03-13 DIAGNOSIS — I152 Hypertension secondary to endocrine disorders: Secondary | ICD-10-CM

## 2022-03-13 DIAGNOSIS — Z13220 Encounter for screening for lipoid disorders: Secondary | ICD-10-CM | POA: Diagnosis not present

## 2022-03-13 DIAGNOSIS — Z0001 Encounter for general adult medical examination with abnormal findings: Secondary | ICD-10-CM

## 2022-03-13 DIAGNOSIS — Z1231 Encounter for screening mammogram for malignant neoplasm of breast: Secondary | ICD-10-CM | POA: Diagnosis not present

## 2022-03-13 DIAGNOSIS — E1159 Type 2 diabetes mellitus with other circulatory complications: Secondary | ICD-10-CM

## 2022-03-13 DIAGNOSIS — H5789 Other specified disorders of eye and adnexa: Secondary | ICD-10-CM

## 2022-03-13 DIAGNOSIS — E782 Mixed hyperlipidemia: Secondary | ICD-10-CM | POA: Diagnosis not present

## 2022-03-13 DIAGNOSIS — E1140 Type 2 diabetes mellitus with diabetic neuropathy, unspecified: Secondary | ICD-10-CM

## 2022-03-13 DIAGNOSIS — Z0000 Encounter for general adult medical examination without abnormal findings: Secondary | ICD-10-CM | POA: Diagnosis not present

## 2022-03-13 DIAGNOSIS — N871 Moderate cervical dysplasia: Secondary | ICD-10-CM | POA: Diagnosis not present

## 2022-03-13 DIAGNOSIS — B20 Human immunodeficiency virus [HIV] disease: Secondary | ICD-10-CM

## 2022-03-13 DIAGNOSIS — R197 Diarrhea, unspecified: Secondary | ICD-10-CM | POA: Diagnosis not present

## 2022-03-13 DIAGNOSIS — Z1211 Encounter for screening for malignant neoplasm of colon: Secondary | ICD-10-CM | POA: Diagnosis not present

## 2022-03-13 LAB — POCT GLYCOSYLATED HEMOGLOBIN (HGB A1C): HbA1c POC (<> result, manual entry): >15 % (ref 4.0–5.6)

## 2022-03-13 MED ORDER — BLOOD GLUCOSE MONITOR KIT: PACK | 0 refills | Status: DC

## 2022-03-13 MED ORDER — TRESIBA FLEXTOUCH 100 UNIT/ML ~~LOC~~ SOPN: 100.0000 [IU] | PEN_INJECTOR | Freq: Every day | SUBCUTANEOUS | 11 refills | Status: DC

## 2022-03-13 MED ORDER — OLOPATADINE HCL 0.2 % OP SOLN: 1.0000 [drp] | Freq: Every day | OPHTHALMIC | 3 refills | Status: DC | PRN

## 2022-03-13 MED ORDER — FEXOFENADINE HCL 180 MG PO TABS: 180.0000 mg | ORAL_TABLET | Freq: Every day | ORAL | 3 refills | Status: DC

## 2022-03-13 NOTE — Progress Notes (Signed)
SUBJECTIVE:   Chief compliant/HPI: annual examination  Leslie Gates is a 56 y.o. who presents today for an annual exam.   HIV: patient last saw Dr. Baxter Flattery in November 2022. She is adherent with Biktarvy by report, her last VL was undetectable in November.   DM2: patient does not check FBG at home, does not have a meter. She is prescribed jardiance, but doesn't think she is taking it. She takes metformin, but intermittently due to severe diarrhea symptoms, see below. Last A1c was uncontrolled at >14% in November 2022. Her last visit to [facility] was in February 2022.  Diarrhea: having diarrhea, liquid brown, every time she eats. The urge to defecate is overwhelming and she cannot go out of her house. Diarrhea without blood, mucus, does not float. She has been taking metformin and notice that her symptoms are worse with metformin, improve without it.  Red right eye: patient has had about a week of scleral injection. She reports that her allergies have been worse lately. No vision problems, no pain with movement, no trauma.   Hypertension: controlled today, but 2 values of 150/90-100 in 2022 (February and November) per chart review. Patient denies symptoms.   HLD: will obtain lipid panel and metabolic panel today.   Feet: reporting burning, tingling, and numbness in b/l feet. No injuries or trauma.   Colorectal cancer screening: patient is very afraid of anesthesia. She appeared twice for colonoscopy, but BP was >200/100 and both times procedure canceled. Discussed options, she would rather have cologuard and then do colonoscopy if high risk result.  Pap smear: Patient has history of abnormal pap smears, see below. Patient follows with gynecology, prefers to do pap there. 2008: abnormal x2 (pathology not in Epic) 2009: WNL 2014: LSIL CIN 1 with a few high grade suspicious cells 2015: LSIL CIN1, HR HPV + 2016: LSIL CIN 1, HR HPV + 2017: LSIL CIN 1, HR HPV +, scheduled for colposcopy,  which confirmed CIN 1 2019: ASCUS, HR HPV + 2021: LSIL, CIN 1, HPV+ but not 16,18,45; colposcopy recommended but not done  History tabs reviewed and updated.   Review of systems form reviewed and notable for above.   OBJECTIVE:   BP 130/86   Pulse 68   Ht 5' 3"  (1.6 m)   Wt 179 lb 6.4 oz (81.4 kg)   LMP 12/05/2021   SpO2 99%   BMI 31.78 kg/m   Nursing note and vitals reviewed GEN: age-appropriate, AAW, resting comfortably in chair, NAD, class I obesity HEENT: NCAT. PERRLA. EOMI. Exophthalmos. Fundoscopy without papilledema. Sclera with injection on the R, no icterus. MMM. Clear oropharynx. Erythematous nasal passages. Neck: Supple. No LAD. Cardiac: Regular rate and rhythm. Normal S1/S2. No murmurs, rubs, or gallops appreciated. 2+ radial pulses. Lungs: Clear bilaterally to ascultation. No increased WOB, no accessory muscle usage. No w/r/r. Neuro: AOx3  Ext: no edema Psych: Pleasant and appropriate  Diabetic Foot Exam - Simple   Simple Foot Form Diabetic Foot exam was performed with the following findings: Yes 03/13/2022  8:56 AM  Visual Inspection No deformities, no ulcerations, no other skin breakdown bilaterally: Yes Sensation Testing See comments: Yes Pulse Check Posterior Tibialis and Dorsalis pulse intact bilaterally: Yes Comments Decreased sensation at great toe.    ASSESSMENT/PLAN:   Type 2 diabetes mellitus with hyperglycemia (HCC) Chronic, uncontrolled.  Current Regimen: discontinue metformin (2/2 to diarrhea), jardiance (A1c too high). Started tresiba 10U sq today and titrate by 2U daily until FBG reaches 150. Dr.  Koval assisted with insulin and glucometer teaching. Goal to get A1c under control and ideally start GLP1, down titrate tresiba, and add in SGLT2 when A1c reaches about 8-9% CBGs: 290 today in office Last A1c: >15% today Denies polyuria, polydipsia, hypoglycemia  Last Eye Exam: >1 year ago, reminded to do annual ophthalmology visit Statin: lipitor  40 mg ACE/ARB: h/o angioedema on ACE-I. Albuminuria: checking microalbumin today.  Will call on Monday to check in on progress. Patient to follow up in ATC in 2 weeks (I am on nights), then in 4 weeks with me.    Moderate dysplasia of cervix Reminded patient to schedule appt with Gynecology for repeat pap  Hypertension associated with type 2 diabetes mellitus ([place]) Chronic, controlled today. Will closely follow, she reports taking spironolactone, not taking amlodipine - patient to monitor BP and will recheck at next visit in 2 weeks - continue spironolactone - metabolic panel obtained today  HIV disease ([place]) Reports adherence, labs 6 months ago show no VL. Reminded her to follow up with [facility].  Obesity, Class I, BMI 30-34.9 Patient had 30 lb weight loss since last medical visit in November 2022. She has not been trying to lose weight, but her diabetes is poorly controlled, suspect this is the likely cause of her weight loss. Will continue to monitor.  Obstructive sleep apnea Reports adherence with CPAP machine daily  Hyperlipidemia On atorvastatin 40 mg. Check lipid panel and CMP today.  Type 2 diabetes mellitus with diabetic neuropathy, unspecified ([place]) Patient with numbness and tingling in her feet occasionally. Foot exam reasurringly normal, no signs of infection. Suspect neuropathy. See above for diabetes plan.  Diarrhea Patient has diarrhea that prevents her from leaving the house. She notices it is worse with metformin, which we will discontinue (see above). Patient does not have pain, no red flag findings with blood or other symptoms. She does have HIV and uncontrolled DM2, which makes her immunocompromised, more prone to infection. However, her last VL was undetectable. Will trial discontinuation of metformin first. If not improved, I recommend GIPP, O&P, and fecal calprotectin to evaluate infectious, parasitic, and inflammatory causes of diarrhea.  Red eye Reports  worsened allergies, no red flag symptoms such as pain or trauma, normal exam except for injection of conjunctiva. Trial of pataday. Recommend follow up with ophthalmology for annual DM screen. Patient does have slight effect of exophthalmos, but this could be normal for her, will check TSH today. If no improvement with Pataday, recommend she follow up with ophthalmology asap.   Annual Examination  See AVS for age appropriate recommendations  PHQ score 4, reviewed and discussed. Low risk for depression, mostly fatigue, question 9 is 0. BP reviewed and at goal today.  Asked about intimate partner violence and resources given as appropriate   Considered the following items based upon USPSTF recommendations: Diabetes screening: discussed and ordered Screening for elevated cholesterol: discussed and ordered HIV testing:  patient has HIV, U/U last check November 2022 Hepatitis C:  negative test 2020 at [facility] Hepatitis B:  negative test 2020 at [facility] Syphilis if at high risk: discussed and recommend follow up with [facility], declines screening today GC/CT  recommend getting while doing pap smear Osteoporosis screening considered based upon risk of fracture from [facility] calculator. Major osteoporotic fracture risk is <1%. DEXA not ordered.  Reviewed risk factors for latent tuberculosis and not indicated   Discussed family history, BRCA testing not indicated.  Cervical cancer screening:  discussed as above, she will follow up  with gynecology Breast cancer screening: discussed potential benefits, risks including overdiagnosis and biopsy, elected proceed with mammogram. Last done in 12/2020. Colorectal cancer screening: discussed options, elected for cologuard Lung cancer screening:  n/a non-smoker .  Vaccinations counseled on COVID, Shingrix.   Follow up in 1 year or sooner if indicated.    Gladys Damme, MD [place]

## 2022-03-13 NOTE — Progress Notes (Signed)
Asked by Dr. Leary Roca to educate patient on use of glucometer and insulin injection.  ? ?Patient was apprehensive about giving injections and sticking her fingers to check blood sugar.  ? ?Patient educated on purpose, proper use and potential adverse effects of Tresiba (insulin degludec).  Following instruction patient verbalized understanding of treatment plan and demonstrated adequate technique while administering first dose of 10 units in office.   She was instructed on the daily titration of insulin by 2 units if fasting blood sugar readings were higher than 150mg /dl.  ? ?Patient educated on purpose, and proper use of accuchek guide. Patient demonstrated adequate technique in checking blood sugar while in office.  ? ?Total time in face-to-face counseling, education, review and administration demonstration by patient was 55 minutes.. ? ?Following instruction, patient verbalized understanding and expressed appreciation for support and help.  ? ?Medication Samples have been provided to the patient. ? ?Drug name: tresiba       Strength: 100units/ml        Qty: 2 pens  LOT:  Exp.Date: 12/17/2023 ? ?Dosing instructions: 10 units today and then increase 2 units daily until fasting CBG is less than 150mg /dl ? ?The patient has been instructed regarding the correct time, dose, and frequency of taking this medication, including desired effects and most common side effects.  ? ?12/19/2023 ?10:20 AM ?03/13/2022 ? ? ? ?

## 2022-03-13 NOTE — Patient Instructions (Addendum)
It was a pleasure to see you today! ? ?Stop taking metformin and see if the diarrhea gets better ?Start taking tresiba with an injection once daily. Start with 10 units. Check your blood sugar first thing in the morning before eating anything. If your blood sugar is above 200, increase by 2 units (so 12, then 14, then 16, etc). Increase by 2 units until your blood sugar in the morning is less than 200, but above 100. If you have low blood sugar (less than 85), please drink some juice and recheck in 15 minutes and call me [phone]. ?We will get some labs today.  If they are abnormal or we need to do something about them, I will call you.  If they are normal, I will send you a message on MyChart (if it is active) or a letter in the mail.  If you don't hear from [place] in 2 weeks, please call the office  [phone]. ?Make follow up appointments with your gynecologist and with Dr. Baxter Flattery ?Make a mammogram appt ?We gave you a cologuard test today, bring it back within the next 2 weeks ?Follow up in 2 weeks ? ?Be Well, ? ?Dr. Chauncey Reading ? ?

## 2022-03-13 NOTE — Assessment & Plan Note (Signed)
Reports adherence with CPAP machine daily ?

## 2022-03-13 NOTE — Assessment & Plan Note (Signed)
Reports adherence, labs 6 months ago show no VL. Reminded her to follow up with [facility]. ?

## 2022-03-13 NOTE — Assessment & Plan Note (Signed)
Chronic, controlled today. Will closely follow, she reports taking spironolactone, not taking amlodipine ?- patient to monitor BP and will recheck at next visit in 2 weeks ?- continue spironolactone ?- metabolic panel obtained today ?

## 2022-03-13 NOTE — Assessment & Plan Note (Signed)
Patient with numbness and tingling in her feet occasionally. Foot exam reasurringly normal, no signs of infection. Suspect neuropathy. See above for diabetes plan. ?

## 2022-03-13 NOTE — Assessment & Plan Note (Signed)
Patient had 30 lb weight loss since last medical visit in November 2022. She has not been trying to lose weight, but her diabetes is poorly controlled, suspect this is the likely cause of her weight loss. Will continue to monitor. ?

## 2022-03-13 NOTE — Assessment & Plan Note (Signed)
Reminded patient to schedule appt with Gynecology for repeat pap ?

## 2022-03-13 NOTE — Assessment & Plan Note (Signed)
Patient has diarrhea that prevents her from leaving the house. She notices it is worse with metformin, which we will discontinue (see above). Patient does not have pain, no red flag findings with blood or other symptoms. She does have HIV and uncontrolled DM2, which makes her immunocompromised, more prone to infection. However, her last VL was undetectable. Will trial discontinuation of metformin first. If not improved, I recommend GIPP, O&P, and fecal calprotectin to evaluate infectious, parasitic, and inflammatory causes of diarrhea. ?

## 2022-03-13 NOTE — Assessment & Plan Note (Signed)
Chronic, uncontrolled.  ?Current Regimen: discontinue metformin (2/2 to diarrhea), jardiance (A1c too high). Started tresiba 10U sq today and titrate by 2U daily until FBG reaches 150. Dr. Raymondo Band assisted with insulin and glucometer teaching. Goal to get A1c under control and ideally start GLP1, down titrate tresiba, and add in SGLT2 when A1c reaches about 8-9% ?CBGs: 290 today in office ?Last A1c: >15% today ?Denies polyuria, polydipsia, hypoglycemia  ?Last Eye Exam: >1 year ago, reminded to do annual ophthalmology visit ?Statin: lipitor 40 mg ?ACE/ARB: h/o angioedema on ACE-I. ?Albuminuria: checking microalbumin today. ? ?Will call on Monday to check in on progress. Patient to follow up in ATC in 2 weeks (I am on nights), then in 4 weeks with me. ? ? ?

## 2022-03-13 NOTE — Assessment & Plan Note (Signed)
On atorvastatin 40 mg. Check lipid panel and CMP today. ?

## 2022-03-13 NOTE — Assessment & Plan Note (Signed)
Reports worsened allergies, no red flag symptoms such as pain or trauma, normal exam except for injection of conjunctiva. Trial of pataday. Recommend follow up with ophthalmology for annual DM screen. Patient does have slight effect of exophthalmos, but this could be normal for her, will check TSH today. If no improvement with Pataday, recommend she follow up with ophthalmology asap. ?

## 2022-03-14 LAB — COMPREHENSIVE METABOLIC PANEL
ALT: 14 IU/L (ref 0–32)
AST: 20 IU/L (ref 0–40)
Albumin/Globulin Ratio: 1.3 (ref 1.2–2.2)
Albumin: 4.4 g/dL (ref 3.8–4.9)
Alkaline Phosphatase: 89 IU/L (ref 44–121)
BUN/Creatinine Ratio: 7 — ABNORMAL LOW (ref 9–23)
BUN: 5 mg/dL — ABNORMAL LOW (ref 6–24)
Bilirubin Total: 0.4 mg/dL (ref 0.0–1.2)
CO2: 26 mmol/L (ref 20–29)
Calcium: 10.4 mg/dL — ABNORMAL HIGH (ref 8.7–10.2)
Chloride: 94 mmol/L — ABNORMAL LOW (ref 96–106)
Creatinine, Ser: 0.69 mg/dL (ref 0.57–1.00)
Globulin, Total: 3.3 g/dL (ref 1.5–4.5)
Glucose: 317 mg/dL — ABNORMAL HIGH (ref 70–99)
Potassium: 4.1 mmol/L (ref 3.5–5.2)
Sodium: 135 mmol/L (ref 134–144)
Total Protein: 7.7 g/dL (ref 6.0–8.5)
eGFR: 103 mL/min/{1.73_m2} (ref >59)

## 2022-03-14 LAB — LIPID PANEL
Chol/HDL Ratio: 2.3 ratio (ref 0.0–4.4)
Cholesterol, Total: 156 mg/dL (ref 100–199)
HDL: 69 mg/dL (ref >39)
LDL Chol Calc (NIH): 66 mg/dL (ref 0–99)
Triglycerides: 121 mg/dL (ref 0–149)
VLDL Cholesterol Cal: 21 mg/dL (ref 5–40)

## 2022-03-14 LAB — RPR: RPR Ser Ql: NONREACTIVE

## 2022-03-14 LAB — MICROALBUMIN / CREATININE URINE RATIO
Creatinine, Urine: 102 mg/dL
Microalb/Creat Ratio: 7 mg/g creat (ref 0–29)
Microalbumin, Urine: 7.4 ug/mL

## 2022-03-14 LAB — TSH: TSH: 0.914 u[IU]/mL (ref 0.450–4.500)

## 2022-03-16 DIAGNOSIS — R0902 Hypoxemia: Secondary | ICD-10-CM | POA: Diagnosis not present

## 2022-03-16 DIAGNOSIS — G4733 Obstructive sleep apnea (adult) (pediatric): Secondary | ICD-10-CM | POA: Diagnosis not present

## 2022-03-16 DIAGNOSIS — B20 Human immunodeficiency virus [HIV] disease: Secondary | ICD-10-CM | POA: Diagnosis not present

## 2022-03-16 DIAGNOSIS — R062 Wheezing: Secondary | ICD-10-CM | POA: Diagnosis not present

## 2022-03-25 ENCOUNTER — Other Ambulatory Visit: Payer: Self-pay | Admitting: Family Medicine

## 2022-03-25 DIAGNOSIS — I152 Hypertension secondary to endocrine disorders: Secondary | ICD-10-CM

## 2022-03-26 ENCOUNTER — Ambulatory Visit
Admission: RE | Admit: 2022-03-26 | Discharge: 2022-03-26 | Disposition: A | Discharge status: Home / Self Care | Payer: Medicaid Other | Source: Ambulatory Visit | Attending: Family Medicine | Admitting: Family Medicine

## 2022-03-26 DIAGNOSIS — Z1231 Encounter for screening mammogram for malignant neoplasm of breast: Secondary | ICD-10-CM | POA: Diagnosis not present

## 2022-03-26 NOTE — Progress Notes (Signed)
    SUBJECTIVE:   CHIEF COMPLAINT / HPI: diabetes f/u   Diabetes Current Regimen: she has not been taking the insulin, patient has not had glucose meter due to pharmacy issues and she is still taking two tabs in the AM  because she does have diarrhea CBGs: unable to measure yet   Last A1c:  Lab Results  Component Value Date   HGBA1C >15.0 03/13/2022    Denies polyuria, polydipsia, hypoglycemia: no longer polydipsia  Statin: yes Reports she has been eating vegetables, stopped with sodas and has been eating oatmeal.   PERTINENT  PMH / PSH:  T2DM  HTN  Diastolic HF    OBJECTIVE:   BP (!) 139/92   Pulse 66   Wt 173 lb (78.5 kg)   SpO2 100%   BMI 30.65 kg/m   Physical Exam Constitutional:      Appearance: She is normal weight.  HENT:     Right Ear: External ear normal.     Left Ear: External ear normal.     Nose: Nose normal.     Mouth/Throat:     Pharynx: Oropharynx is clear.  Cardiovascular:     Rate and Rhythm: Normal rate and regular rhythm.  Pulmonary:     Effort: Pulmonary effort is normal.  Abdominal:     General: Bowel sounds are normal.     Palpations: Abdomen is soft.  Musculoskeletal:     Cervical back: Normal range of motion.  Skin:    General: Skin is warm.  Neurological:     General: No focal deficit present.     Mental Status: She is alert and oriented to person, place, and time.    ASSESSMENT/PLAN:   Type 2 diabetes mellitus with hyperglycemia (HCC) Contacted patient's pharmacy for clarification on her glucose testing supplies and tresiba prescriptions  Able to resend prescriptions and expect them to be available 5/15 per pharmacist  Patient will start tresiba as prescribed at that time    Ronnald Ramp, MD [facility]

## 2022-03-27 ENCOUNTER — Other Ambulatory Visit: Payer: Self-pay | Admitting: Family Medicine

## 2022-03-27 ENCOUNTER — Other Ambulatory Visit: Payer: Self-pay

## 2022-03-27 ENCOUNTER — Ambulatory Visit (INDEPENDENT_AMBULATORY_CARE_PROVIDER_SITE_OTHER): Payer: Medicaid Other | Admitting: Family Medicine

## 2022-03-27 VITALS — BP 139/92 | HR 66 | Wt 173.0 lb

## 2022-03-27 DIAGNOSIS — E1165 Type 2 diabetes mellitus with hyperglycemia: Secondary | ICD-10-CM

## 2022-03-27 DIAGNOSIS — E119 Type 2 diabetes mellitus without complications: Secondary | ICD-10-CM | POA: Diagnosis not present

## 2022-03-27 MED ORDER — TRESIBA FLEXTOUCH 100 UNIT/ML ~~LOC~~ SOPN: 10.0000 [IU] | PEN_INJECTOR | Freq: Every day | SUBCUTANEOUS | 11 refills | Status: DC

## 2022-03-27 MED ORDER — BLOOD GLUCOSE MONITOR KIT: PACK | 0 refills | Status: AC

## 2022-03-27 NOTE — Patient Instructions (Addendum)
We will check your blood sugars today. ? ?I have resent prescriptions for your glucose meter as well as her [place].  Please start taking 10 units of the [place] when she gets this prescription.  At that time you can discontinue the metformin. ? ?Please follow-up as scheduled on 04/08/2022 with Dr. Leary Roca. ?

## 2022-03-28 DIAGNOSIS — Z1211 Encounter for screening for malignant neoplasm of colon: Secondary | ICD-10-CM | POA: Diagnosis not present

## 2022-04-01 ENCOUNTER — Encounter (HOSPITAL_COMMUNITY): Payer: Self-pay

## 2022-04-01 ENCOUNTER — Ambulatory Visit (HOSPITAL_COMMUNITY)
Admission: EM | Admit: 2022-04-01 | Discharge: 2022-04-01 | Disposition: A | Discharge status: Home / Self Care | Payer: Medicaid Other | Attending: Family Medicine | Admitting: Family Medicine

## 2022-04-01 DIAGNOSIS — Z91141 Patient's other noncompliance with medication regimen due to financial hardship: Secondary | ICD-10-CM | POA: Diagnosis not present

## 2022-04-01 DIAGNOSIS — R197 Diarrhea, unspecified: Secondary | ICD-10-CM | POA: Insufficient documentation

## 2022-04-01 DIAGNOSIS — E1159 Type 2 diabetes mellitus with other circulatory complications: Secondary | ICD-10-CM

## 2022-04-01 DIAGNOSIS — Z7984 Long term (current) use of oral hypoglycemic drugs: Secondary | ICD-10-CM | POA: Diagnosis not present

## 2022-04-01 DIAGNOSIS — D696 Thrombocytopenia, unspecified: Secondary | ICD-10-CM | POA: Diagnosis not present

## 2022-04-01 DIAGNOSIS — E0865 Diabetes mellitus due to underlying condition with hyperglycemia: Secondary | ICD-10-CM | POA: Diagnosis present

## 2022-04-01 DIAGNOSIS — E1165 Type 2 diabetes mellitus with hyperglycemia: Secondary | ICD-10-CM | POA: Diagnosis not present

## 2022-04-01 LAB — POCT URINALYSIS DIPSTICK, ED / UC
Glucose, UA: 500 mg/dL — AB
Leukocytes,Ua: NEGATIVE
Nitrite: NEGATIVE
Protein, ur: >=300 mg/dL — AB
Specific Gravity, Urine: >=1.03 (ref 1.005–1.030)
Urobilinogen, UA: 0.2 mg/dL (ref 0.0–1.0)
pH: 5.5 (ref 5.0–8.0)

## 2022-04-01 LAB — COMPREHENSIVE METABOLIC PANEL
ALT: 36 U/L (ref 0–44)
AST: 35 U/L (ref 15–41)
Albumin: 4.2 g/dL (ref 3.5–5.0)
Alkaline Phosphatase: 88 U/L (ref 38–126)
Anion gap: 10 (ref 5–15)
BUN: 17 mg/dL (ref 6–20)
CO2: 22 mmol/L (ref 22–32)
Calcium: 10.2 mg/dL (ref 8.9–10.3)
Chloride: 101 mmol/L (ref 98–111)
Creatinine, Ser: 1.04 mg/dL — ABNORMAL HIGH (ref 0.44–1.00)
GFR, Estimated: >60 mL/min (ref >60)
Glucose, Bld: 336 mg/dL — ABNORMAL HIGH (ref 70–99)
Potassium: 4.5 mmol/L (ref 3.5–5.1)
Sodium: 133 mmol/L — ABNORMAL LOW (ref 135–145)
Total Bilirubin: 0.8 mg/dL (ref 0.3–1.2)
Total Protein: 8.7 g/dL — ABNORMAL HIGH (ref 6.5–8.1)

## 2022-04-01 LAB — CBC WITH DIFFERENTIAL/PLATELET
Abs Immature Granulocytes: 0.02 10*3/uL (ref 0.00–0.07)
Basophils Absolute: 0 10*3/uL (ref 0.0–0.1)
Basophils Relative: 1 %
Eosinophils Absolute: 0 10*3/uL (ref 0.0–0.5)
Eosinophils Relative: 1 %
HCT: 47.8 % — ABNORMAL HIGH (ref 36.0–46.0)
Hemoglobin: 15.8 g/dL — ABNORMAL HIGH (ref 12.0–15.0)
Immature Granulocytes: 1 %
Lymphocytes Relative: 32 %
Lymphs Abs: 1.2 10*3/uL (ref 0.7–4.0)
MCH: 30.4 pg (ref 26.0–34.0)
MCHC: 33.1 g/dL (ref 30.0–36.0)
MCV: 91.9 fL (ref 80.0–100.0)
Monocytes Absolute: 0.3 10*3/uL (ref 0.1–1.0)
Monocytes Relative: 9 %
Neutro Abs: 2.2 10*3/uL (ref 1.7–7.7)
Neutrophils Relative %: 56 %
Platelets: 74 10*3/uL — ABNORMAL LOW (ref 150–400)
RBC: 5.2 MIL/uL — ABNORMAL HIGH (ref 3.87–5.11)
RDW: 12.3 % (ref 11.5–15.5)
WBC: 3.9 10*3/uL — ABNORMAL LOW (ref 4.0–10.5)
nRBC: 0 % (ref 0.0–0.2)

## 2022-04-01 LAB — CBG MONITORING, ED: Glucose-Capillary: 332 mg/dL — ABNORMAL HIGH (ref 70–99)

## 2022-04-01 LAB — BETA-HYDROXYBUTYRIC ACID: Beta-Hydroxybutyric Acid: 0.34 mmol/L — ABNORMAL HIGH (ref 0.05–0.27)

## 2022-04-01 LAB — IMMATURE PLATELET FRACTION: Immature Platelet Fraction: 3.4 % (ref 1.2–8.6)

## 2022-04-01 LAB — LIPASE, BLOOD: Lipase: 63 U/L — ABNORMAL HIGH (ref 11–51)

## 2022-04-01 MED ORDER — ONDANSETRON 4 MG PO TBDP
4.0000 mg | ORAL_TABLET | Freq: Once | ORAL | Status: AC
  Administered 2022-04-01: 4 mg via ORAL

## 2022-04-01 MED ORDER — SODIUM CHLORIDE 0.9 % IV BOLUS
1000.0000 mL | Freq: Once | INTRAVENOUS | Status: AC
  Administered 2022-04-01: 1000 mL via INTRAVENOUS

## 2022-04-01 MED ORDER — ONDANSETRON 4 MG PO TBDP
ORAL_TABLET | ORAL | Status: AC
  Filled 2022-04-01: qty 1

## 2022-04-01 NOTE — ED Triage Notes (Addendum)
Pt c/o diarrhea with nausea x2 days. States diarrhea x4 today. States unable to eat anything. Denies taking any meds for sx's. ?

## 2022-04-01 NOTE — ED Provider Notes (Addendum)
[facility]   [id number] 04/01/22 Arrival Time: 1025  ASSESSMENT & PLAN:  1. Diabetes mellitus due to underlying condition with hyperglycemia, without long-term current use of insulin (HCC)   2. Thrombocytopenia (HCC)   3. Diarrhea, unspecified type    Unclear etiology of thrombocytopenia. No acute bleeding. WBC slightly low with normal diff; 3.5 approx 6 mo ago. Platelets normal at that time. Pending: BETA-HYDROXYBUTYRIC ACID  IMMATURE PLATELET FRACTION   After discussion, she would like to be evaluated in ED vs outpt f/u in 24-48 hours to recheck labs pending above results. Has not been on insulin before and is uncomfortable with home observation given symptoms/hyperglycemia and the fact that she will not be able to initiate insulin tx for several more days. Would prefer to be discharged now and proceed to ED. Stable upon discharge.  Does report improvement after IVF. Slightly elevated Cr likely pre-renal.  Meds ordered this encounter  Medications   ondansetron (ZOFRAN-ODT) disintegrating tablet 4 mg   sodium chloride 0.9 % bolus 1,000 mL   Labs Reviewed  CBC WITH DIFFERENTIAL/PLATELET - Abnormal; Notable for the following components:      Result Value   WBC 3.9 (*)    RBC 5.20 (*)    Hemoglobin 15.8 (*)    HCT 47.8 (*)    Platelets 74 (*)    All other components within normal limits  COMPREHENSIVE METABOLIC PANEL - Abnormal; Notable for the following components:   Sodium 133 (*)    Glucose, Bld 336 (*)    Creatinine, Ser 1.04 (*)    Total Protein 8.7 (*)    All other components within normal limits  LIPASE, BLOOD - Abnormal; Notable for the following components:   Lipase 63 (*)    All other components within normal limits  CBG MONITORING, ED - Abnormal; Notable for the following components:   Glucose-Capillary 332 (*)    All other components within normal limits  POCT URINALYSIS DIPSTICK, ED / UC - Abnormal; Notable for the following components:   Glucose,  UA 500 (*)    Bilirubin Urine MODERATE (*)    Ketones, ur TRACE (*)    Hgb urine dipstick TRACE (*)    Protein, ur >=300 (*)    All other components within normal limits  BETA-HYDROXYBUTYRIC ACID  IMMATURE PLATELET FRACTION   Reviewed expectations re: course of current medical issues. Questions answered. Outlined signs and symptoms indicating need for more acute intervention. Patient verbalized understanding. After Visit Summary given.   SUBJECTIVE: History from: patient.  Leslie Gates is a 56 y.o. female with uncontrolled DM who presents with complaint of post-prandial non-bloody diarrhea; abrupt onset approx 48 hours ago; with associated nausea; no emesis. Decreased PO intake including PO fluids. No specific urinary symptoms. Afebrile but chilled at times. Is fatigued. Mild epigastric abdominal discomfort described as cramping. No back pain. Was recently told she needed to be on insulin but has not been able to fill secondary to financial reasons and some confusion on having insulin shipped mail order; should receive soon and doesn't want to pay twice. On metformin but misses doses secondary to loose stools she attributes to metformin. Does not have glucometer at home. Does reports significant thirst.  HgA1c >15 on 03/13/2022. Last couple of CBG in chart in the low 300's.  Social History   Substance and Sexual Activity  Alcohol Use No   Alcohol/week: 0.0 standard drinks   Social History   Tobacco Use  Smoking Status Never  Smokeless Tobacco Never   Past Surgical History:  Procedure Laterality Date   ADENOIDECTOMY     CESAREAN SECTION     NASAL SINUS SURGERY  2004   [facility]   RIGHT HEART CATHETERIZATION N/A 02/01/2012   Procedure: RIGHT HEART CATH;  Surgeon: Dolores Patty, MD;  Location: [facility] CATH LAB;  Service: Cardiovascular;  Laterality: N/A;   TYMPANOSTOMY TUBE PLACEMENT     as a child   WRIST FRACTURE SURGERY  2006   bilateral - [facility]     OBJECTIVE:  Vitals:   04/01/22 1105  BP: 122/81  Pulse: 92  Resp: 18  Temp: 98.5 F (36.9 C)  TempSrc: Oral  SpO2: 100%    General appearance: alert; no distress Oropharynx: dry Lungs: clear to auscultation bilaterally; unlabored Heart: regular Abdomen: soft; non-distended; no significant abdominal tenderness; bowel sounds present; no masses or organomegaly; no guarding or rebound tenderness Back: no CVA tenderness Extremities: no edema; symmetrical with no gross deformities Skin: warm; dry Neurologic: normal gait Psychological: alert and cooperative; normal mood and affect  Labs: Results for orders placed or performed during the hospital encounter of 04/01/22  CBC with Differential/Platelet  Result Value Ref Range   WBC 3.9 (L) 4.0 - 10.5 K/uL   RBC 5.20 (H) 3.87 - 5.11 MIL/uL   Hemoglobin 15.8 (H) 12.0 - 15.0 g/dL   HCT 11.9 (H) 14.7 - 82.9 %   MCV 91.9 80.0 - 100.0 fL   MCH 30.4 26.0 - 34.0 pg   MCHC 33.1 30.0 - 36.0 g/dL   RDW 56.2 13.0 - 86.5 %   Platelets 74 (L) 150 - 400 K/uL   nRBC 0.0 0.0 - 0.2 %   Neutrophils Relative % 56 %   Neutro Abs 2.2 1.7 - 7.7 K/uL   Lymphocytes Relative 32 %   Lymphs Abs 1.2 0.7 - 4.0 K/uL   Monocytes Relative 9 %   Monocytes Absolute 0.3 0.1 - 1.0 K/uL   Eosinophils Relative 1 %   Eosinophils Absolute 0.0 0.0 - 0.5 K/uL   Basophils Relative 1 %   Basophils Absolute 0.0 0.0 - 0.1 K/uL   Immature Granulocytes 1 %   Abs Immature Granulocytes 0.02 0.00 - 0.07 K/uL  Comprehensive metabolic panel  Result Value Ref Range   Sodium 133 (L) 135 - 145 mmol/L   Potassium 4.5 3.5 - 5.1 mmol/L   Chloride 101 98 - 111 mmol/L   CO2 22 22 - 32 mmol/L   Glucose, Bld 336 (H) 70 - 99 mg/dL   BUN 17 6 - 20 mg/dL   Creatinine, Ser 7.84 (H) 0.44 - 1.00 mg/dL   Calcium 69.6 8.9 - 29.5 mg/dL   Total Protein 8.7 (H) 6.5 - 8.1 g/dL   Albumin 4.2 3.5 - 5.0 g/dL   AST 35 15 - 41 U/L   ALT 36 0 - 44 U/L   Alkaline Phosphatase 88 38 - 126 U/L    Total Bilirubin 0.8 0.3 - 1.2 mg/dL   GFR, Estimated >28 >41 mL/min   Anion gap 10 5 - 15  Lipase, blood  Result Value Ref Range   Lipase 63 (H) 11 - 51 U/L  POC CBG monitoring  Result Value Ref Range   Glucose-Capillary 332 (H) 70 - 99 mg/dL  POC Urinalysis dipstick  Result Value Ref Range   Glucose, UA 500 (A) NEGATIVE mg/dL   Bilirubin Urine MODERATE (A) NEGATIVE   Ketones, ur TRACE (A) NEGATIVE mg/dL   Specific Gravity, Urine >=  1.030 1.005 - 1.030   Hgb urine dipstick TRACE (A) NEGATIVE   pH 5.5 5.0 - 8.0   Protein, ur >=300 (A) NEGATIVE mg/dL   Urobilinogen, UA 0.2 0.0 - 1.0 mg/dL   Nitrite NEGATIVE NEGATIVE   Leukocytes,Ua NEGATIVE NEGATIVE   Labs Reviewed  CBG MONITORING, ED - Abnormal; Notable for the following components:      Result Value   Glucose-Capillary 332 (*)    All other components within normal limits  POCT URINALYSIS DIPSTICK, ED / UC - Abnormal; Notable for the following components:   Glucose, UA 500 (*)    Bilirubin Urine MODERATE (*)    Ketones, ur TRACE (*)    Hgb urine dipstick TRACE (*)    Protein, ur >=300 (*)    All other components within normal limits  CBC WITH DIFFERENTIAL/PLATELET  COMPREHENSIVE METABOLIC PANEL  LIPASE, BLOOD  BETA-HYDROXYBUTYRIC ACID     Allergies  Allergen Reactions   Lisinopril Swelling    Very swollen bottom lip                                               Past Medical History:  Diagnosis Date   Allergic rhinitis    Allergy    Anemia    Anxiety    Arthritis    Asthma    Chronic otitis media    Coronary artery disease    Depression    Diastolic CHF, chronic (HCC)    Dysphagia    GERD (gastroesophageal reflux disease)    History of cervical dysplasia    History of herpes zoster    HTN (hypertension)    Human immunodeficiency virus (HIV) (HCC) 2001   Hyperlipidemia    Obesity    OSA (obstructive sleep apnea)    Oxygen deficiency    Pulmonary HTN (HCC)    Sleep apnea    Wrist fracture,  bilateral    Social History   Socioeconomic History   Marital status: Single    Spouse name: Not on file   Number of children: 2   Years of education: Not on file   Highest education level: Not on file  Occupational History    Comment: unemployed  Tobacco Use   Smoking status: Never   Smokeless tobacco: Never  Vaping Use   Vaping Use: Never used  Substance and Sexual Activity   Alcohol use: No    Alcohol/week: 0.0 standard drinks   Drug use: No   Sexual activity: Yes    Birth control/protection: Surgical  Other Topics Concern   Not on file  Social History Narrative   Not on file   Social Determinants of Health   Financial Resource Strain: Not on file  Food Insecurity: Not on file  Transportation Needs: Not on file  Physical Activity: Not on file  Stress: Not on file  Social Connections: Not on file  Intimate Partner Violence: Not on file   Family History  Problem Relation Age of Onset   Other Father        GSW   Hypertension Father    Kidney disease Father        was on dialysis prior to death   Alcohol abuse Father        liver failure from alcohol use   Other Mother 25       ESRD   Hyperlipidemia  Mother    Hypertension Mother    Alcohol abuse Mother    Hypertension Other    Hypertension Sister    Alcohol abuse Sister    Hypertension Brother    Alcohol abuse Brother       Woodside, Arlys John, MD 04/01/22 1510    Mardella Layman, MD 04/01/22 1511

## 2022-04-02 ENCOUNTER — Telehealth: Payer: Self-pay | Admitting: Internal Medicine

## 2022-04-02 NOTE — Telephone Encounter (Signed)
Patient called and left message. Returned call, no answer, and left voicemail. In attempt to schedule patient their follow up appointment.

## 2022-04-02 NOTE — Assessment & Plan Note (Signed)
Contacted patient's pharmacy for clarification on her glucose testing supplies and tresiba prescriptions  Able to resend prescriptions and expect them to be available 5/15 per pharmacist  Patient will start tresiba as prescribed at that time

## 2022-04-03 ENCOUNTER — Telehealth: Payer: Self-pay

## 2022-04-03 ENCOUNTER — Emergency Department (HOSPITAL_COMMUNITY)
Admission: EM | Admit: 2022-04-03 | Discharge: 2022-04-03 | Disposition: A | Discharge status: Home / Self Care | Payer: Medicaid Other | Attending: Emergency Medicine | Admitting: Emergency Medicine

## 2022-04-03 ENCOUNTER — Other Ambulatory Visit: Payer: Self-pay

## 2022-04-03 ENCOUNTER — Emergency Department (HOSPITAL_COMMUNITY): Payer: Medicaid Other

## 2022-04-03 ENCOUNTER — Ambulatory Visit (INDEPENDENT_AMBULATORY_CARE_PROVIDER_SITE_OTHER): Payer: Medicaid Other | Admitting: Family Medicine

## 2022-04-03 VITALS — BP 108/84 | HR 78 | Resp 24 | Ht 63.0 in | Wt 176.0 lb

## 2022-04-03 DIAGNOSIS — K529 Noninfective gastroenteritis and colitis, unspecified: Secondary | ICD-10-CM | POA: Diagnosis not present

## 2022-04-03 DIAGNOSIS — E1165 Type 2 diabetes mellitus with hyperglycemia: Secondary | ICD-10-CM | POA: Insufficient documentation

## 2022-04-03 DIAGNOSIS — E1365 Other specified diabetes mellitus with hyperglycemia: Secondary | ICD-10-CM

## 2022-04-03 DIAGNOSIS — R197 Diarrhea, unspecified: Secondary | ICD-10-CM

## 2022-04-03 DIAGNOSIS — R109 Unspecified abdominal pain: Secondary | ICD-10-CM | POA: Diagnosis not present

## 2022-04-03 LAB — CBC
HCT: 44.9 % (ref 36.0–46.0)
Hemoglobin: 14.4 g/dL (ref 12.0–15.0)
MCH: 29.6 pg (ref 26.0–34.0)
MCHC: 32.1 g/dL (ref 30.0–36.0)
MCV: 92.2 fL (ref 80.0–100.0)
Platelets: 325 10*3/uL (ref 150–400)
RBC: 4.87 MIL/uL (ref 3.87–5.11)
RDW: 12.2 % (ref 11.5–15.5)
WBC: 3.8 10*3/uL — ABNORMAL LOW (ref 4.0–10.5)
nRBC: 0 % (ref 0.0–0.2)

## 2022-04-03 LAB — URINALYSIS, ROUTINE W REFLEX MICROSCOPIC
Bacteria, UA: NONE SEEN
Bilirubin Urine: NEGATIVE
Glucose, UA: >=500 mg/dL — AB
Hgb urine dipstick: NEGATIVE
Ketones, ur: NEGATIVE mg/dL
Leukocytes,Ua: NEGATIVE
Nitrite: NEGATIVE
Protein, ur: 30 mg/dL — AB
Specific Gravity, Urine: 1.03 (ref 1.005–1.030)
pH: 6 (ref 5.0–8.0)

## 2022-04-03 LAB — POCT CBG (FASTING - GLUCOSE)-MANUAL ENTRY: Glucose Fasting, POC: 330 mg/dL — AB (ref 70–99)

## 2022-04-03 LAB — COMPREHENSIVE METABOLIC PANEL
ALT: 29 U/L (ref 0–44)
AST: 29 U/L (ref 15–41)
Albumin: 3.9 g/dL (ref 3.5–5.0)
Alkaline Phosphatase: 83 U/L (ref 38–126)
Anion gap: 10 (ref 5–15)
BUN: 12 mg/dL (ref 6–20)
CO2: 17 mmol/L — ABNORMAL LOW (ref 22–32)
Calcium: 9.7 mg/dL (ref 8.9–10.3)
Chloride: 106 mmol/L (ref 98–111)
Creatinine, Ser: 0.83 mg/dL (ref 0.44–1.00)
GFR, Estimated: >60 mL/min (ref >60)
Glucose, Bld: 318 mg/dL — ABNORMAL HIGH (ref 70–99)
Potassium: 3.5 mmol/L (ref 3.5–5.1)
Sodium: 133 mmol/L — ABNORMAL LOW (ref 135–145)
Total Bilirubin: 0.3 mg/dL (ref 0.3–1.2)
Total Protein: 8.2 g/dL — ABNORMAL HIGH (ref 6.5–8.1)

## 2022-04-03 LAB — LIPASE, BLOOD: Lipase: 30 U/L (ref 11–51)

## 2022-04-03 LAB — CBG MONITORING, ED
Glucose-Capillary: 327 mg/dL — ABNORMAL HIGH (ref 70–99)
Glucose-Capillary: 276 mg/dL — ABNORMAL HIGH (ref 70–99)

## 2022-04-03 MED ORDER — IOHEXOL 300 MG/ML  SOLN
100.0000 mL | Freq: Once | INTRAMUSCULAR | Status: AC | PRN
  Administered 2022-04-03: 100 mL via INTRAVENOUS

## 2022-04-03 NOTE — ED Notes (Signed)
Discharge instructions reviewed with patient. Patient verbalized understanding of instructions. Follow-up care and medications were reviewed. Patient ambulatory with steady gait. VSS upon discharge.  ?

## 2022-04-03 NOTE — Patient Instructions (Signed)
I am sorry you are feeling unwell.  Because you continue to have diarrhea I worry that you could be in DKA.  I would like for you to go be assessed in the ED today for this.  If not in DKA you may just need IV fluids for dehydration.  Please be sure to schedule follow up with [place] after you leave the hospital.  Please call the clinic at [phone] if your symptoms worsen or you have any concerns. It was our pleasure to serve you.  Dr. Salvadore Dom

## 2022-04-03 NOTE — ED Triage Notes (Signed)
Pt. Stated, ive had diarrhea and nausea for 4 days and I cant eat anything.

## 2022-04-03 NOTE — ED Notes (Signed)
Patient transported to CT 

## 2022-04-03 NOTE — Telephone Encounter (Signed)
A Prior Authorization was initiated for this patients TRESIBA through CoverMyMeds.   Key: [id number]

## 2022-04-03 NOTE — ED Notes (Signed)
[id number] 

## 2022-04-03 NOTE — Discharge Instructions (Addendum)
A referral was placed today for follow up with nutrition and diabetes education. Diarrhea can be caused many things. Sometimes, antibiotics can make diarrhea better, sometimes it can make it worse. To decide if antibiotics are right for your diarrhea- a stool sample was sent out today (if provided prior to discharge, if unable to provide a sample before discharge your primary care provider can follow-up in the office for sample collection). If your diarrhea requires antibiotics, a prescription will be sent in to your pharmacy.   Follow up with your doctor for recheck next week.

## 2022-04-03 NOTE — Progress Notes (Signed)
Inpatient Diabetes Program Recommendations  AACE/ADA: New Consensus Statement on Inpatient Glycemic Control (2015)  Target Ranges:  Prepandial:   less than 140 mg/dL      Peak postprandial:   less than 180 mg/dL (1-2 hours)      Critically ill patients:  140 - 180 mg/dL   Lab Results  Component Value Date   GLUCAP 276 (H) 04/03/2022   HGBA1C >15.0 03/13/2022    Latest Reference Range & Units 04/03/22 12:32 04/03/22 15:43  Glucose-Capillary 70 - 99 mg/dL 327 (H) 276 (H)   Spoke with patient about diabetes and home regimen for diabetes control. Patient reports that she is followed by her PCP for diabetes management. Pt reports not being able to tolerate metformin outpatient and had her first dose of Tresiba 10 units outpatient last night. I showed pt and husband at bedside how to operate her home glucometer and how to operate an insulin pen for home use. We discussed heavily lifestyle modifications in diet and exercise. Pt has gym equipment available at her home. I answered many questions related to medication options for DM control. Discussed s/s of hypoglycemia and treatment. Discussed glucose and A1c goals. Discussed importance of checking CBGs and maintaining good CBG control to prevent long-term and short-term complications. Explained how hyperglycemia leads to damage within blood vessels which lead to the common complications seen with uncontrolled diabetes. Discussed impact of nutrition, exercise, stress, sickness, and medications on diabetes control.  Patient verbalized understanding of information discussed and has no further questions at this time related to diabetes.   Thanks,  Tama Headings RN, MSN, BC-ADM Inpatient Diabetes Coordinator Team Pager [phone] (8a-5p)

## 2022-04-03 NOTE — ED Provider Triage Note (Signed)
Emergency Medicine Provider Triage Evaluation Note  Leslie Gates , a 56 y.o. female  was evaluated in triage.  Pt complains of watery, nonbloody diarrhea onset 4 days.  Patient has associated nausea and diffuse abdominal pain.  No meds tried prior to arrival to the ED.  Denies fever, chills, dysuria, hematuria, vomiting, constipation.   Per patient chart review: Patient had Tresiba sent to her pharmacy on yesterday.  Patient takes metformin.  Review of Systems  Positive: As per HPI above Negative:   Physical Exam  BP 123/82 (BP Location: Left Arm)   Pulse 80   Temp 98 F (36.7 C) (Oral)   Resp 16   Ht 5\' 3"  (1.6 m)   Wt 74.8 kg   LMP 12/05/2021   SpO2 100%   BMI 29.23 kg/m  Gen:   Awake, no distress  Resp:  Normal effort  MSK:   Moves extremities without difficulty  Other:  Diffuse abdominal tenderness to palpation  Medical Decision Making  Medically screening exam initiated at 12:23 PM.  Appropriate orders placed.  ALLYCIA PITZ was informed that the remainder of the evaluation will be completed by another provider, this initial triage assessment does not replace that evaluation, and the importance of remaining in the ED until their evaluation is complete.  Work-up initiated   Arleta Ostrum A, PA-C 04/03/22 1237

## 2022-04-03 NOTE — Progress Notes (Signed)
SUBJECTIVE:   CHIEF COMPLAINT / HPI:   Follow up from UC Was seen in UC 5/17 for diarrhea. She was told she had some abnormal labs and to follow up at the ED. She stated she went to the emergency department but did not stay to be seen due to the long wait time.  She is here today stating that she does not feel well.  She continues to have daily diarrhea now for a total of 4 days.  She denies any association with emesis but has not eaten any food.  She states that she is not taking in her medicines except for her insulin.  She endorses feeling weak and some abdominal discomfort as if her stomach is bubbling.  She denies eating any raw foods or new foods during this time.  She has not been able to check her blood sugars at home.  She states she does not know how to use the lancet.  PERTINENT  PMH / PSH: HIV, HTN, CHF, type 2 diabetes, prior history of diarrhea  OBJECTIVE:   BP 108/84   Ht 5\' 3"  (1.6 m)   Wt 176 lb (79.8 kg)   LMP 12/05/2021   BMI 31.18 kg/m   Physical Exam Vitals reviewed.  Constitutional:      General: She is not in acute distress.    Appearance: She is ill-appearing. She is not toxic-appearing or diaphoretic.  Cardiovascular:     Rate and Rhythm: Normal rate and regular rhythm.     Pulses: Normal pulses.     Heart sounds: Normal heart sounds.  Pulmonary:     Breath sounds: Normal breath sounds. No stridor. No wheezing or rhonchi.     Comments: Appears to be kussmaul breathing Abdominal:     General: Bowel sounds are normal. There is no distension.     Palpations: There is no mass.     Tenderness: There is no abdominal tenderness. There is no guarding or rebound.  Musculoskeletal:     Right lower leg: No edema.     Left lower leg: No edema.  Skin:    Findings: No rash.     Comments: Decrease skin turgor  Neurological:     General: No focal deficit present.     Mental Status: She is alert and oriented to person, place, and time.   ASSESSMENT/PLAN:    1. Diarrhea, unspecified type Acute on chronic. Worsening over the last 4 days with decreased PO intake. Has not taken any medication by mouth, endorses taking her 10 units of tresiba daily. Seen at UC 2 days prior for this issue and was told to go to ED. She was not assessed in the ED due to leaving because of the long wait time. Reviewed labwork with Dr. 12/07/2021, not in DKA at that time but concern for this with kussmaul breathing, elevated CBG, and continued GI losses. Suggested further evaluation in ED. The patient and her partner voiced understanding and planned to go.   2. Type 2 diabetes mellitus with hyperglycemia, without long-term current use of insulin (HCC) Taking metformin and tresiba. Metformin could be contributory to continued diarrhea. Concern for DKA for reasons above. Elevated to 300s today despite taking prescribed insulin. Will need better management after ED evaluation. Patient states she feels she needs a Pollie Meyer to help with her healthcare, if she is not admitted consider CCM referral at follow up.  - Glucose (CBG), Fasting  Child psychotherapist, DO [facility]

## 2022-04-03 NOTE — ED Provider Notes (Signed)
[facility] EMERGENCY DEPARTMENT Provider Note   CSN: [id number] Arrival date & time: 04/03/22  1135     History  Chief Complaint  Patient presents with   Nausea   Diarrhea   Anorexia    Leslie Gates is a 56 y.o. female.  56 year old female with past medical history of diabetes, hypertension, hyperlipidemia, HIV, GERD, pulmonary hypertension, OSA, diastolic CHF, presents with complaint for nonbloody diarrhea which occurs frequently throughout the day, worse with meals, has been an ongoing problem for the past several months.  Initially started after she started metformin, went back to her doctor who told her that this should stop however it did not.  Patient then decided to discontinue her metformin.  Her blood sugar became uncontrolled, she was advised to start insulin which she just recently started yesterday however does not feel comfortable taking the insulin because she is having so much diarrhea she does not feel like she is keeping any food in her stomach to counter the insulin.  States that she was scheduled for colonoscopy however it made her blood pressure go sky high and ultimately she had a Cologuard which was negative.  No GI work-up that she is aware of previously.  Denies abdominal pain, fevers.  Reports that she has had weight loss progressive over the past several months.      Home Medications    Prior to Admission medications   Medication Sig Start Date End Date Taking? Authorizing Provider  amLODipine (NORVASC) 10 MG tablet TAKE ONE TABLET BY MOUTH AT NIGHT. - NEEDS APPT FOR FURTURE REFILLS- Patient taking differently: Take 10 mg by mouth daily. 03/25/22   Gladys Damme, MD  atorvastatin (LIPITOR) 40 MG tablet TAKE ONE TABLET BY MOUTH DAILY - NEEDS APPT FOR FUTURE REFILLS- Patient taking differently: Take 40 mg by mouth daily. 03/12/22   Gladys Damme, MD  BIKTARVY 50-200-25 MG TABS tablet TAKE ONE TABLET BY MOUTH EVERY DAY this replaces  genvoya Patient taking differently: Take 1 tablet by mouth daily. 12/31/21   Carlyle Basques, MD  blood glucose meter kit and supplies KIT Dispense based on patient and insurance preference. Use up to four times daily as directed. 03/27/22   Simmons-Robinson, Makiera, MD  Budeson-Glycopyrrol-Formoterol (BREZTRI AEROSPHERE) 160-9-4.8 MCG/ACT AERO Inhale 160 mcg into the lungs 2 (two) times daily. 08/25/21   Chesley Mires, MD  fexofenadine (ALLEGRA) 180 MG tablet Take 1 tablet (180 mg total) by mouth daily. 03/13/22   Gladys Damme, MD  furosemide (LASIX) 20 MG tablet TAKE ONE TABLET BY MOUTH EVERY DAY. MAY TAKE EXTRA TABLET EVERY DAY AS NEEDED FOR WEIGHT GAIN 3 LBS OR MORE OVERNIGHT Patient taking differently: Take 20 mg by mouth daily. 12/31/21   Gladys Damme, MD  metFORMIN (GLUCOPHAGE-XR) 500 MG 24 hr tablet TAKE TWO TABLETS BY MOUTH IN THE MORNING AND TWO TABLETS AT BEDTIME -NEEDS APPT FOR FUTURE REFILLS- Patient taking differently: 1,000 mg in the morning and at bedtime. 03/12/22   Gladys Damme, MD  Multiple Vitamin (TAB-A-VITE) TABS TAKE ONE TABLET BY MOUTH EVERY DAY 11/06/21   McDiarmid, Blane Ohara, MD  NAPHCON-A 0.025-0.3 % ophthalmic solution INSTILL 1 DROP INTO BOTH EYES FOUR TIMES A DAY AS NEEDED FOR IRRITATION Patient taking differently: Place 1 drop into both eyes 4 (four) times daily as needed for eye irritation. 08/06/19   Mullis, Kiersten P, DO  neomycin-polymyxin b-dexamethasone (MAXITROL) 3.5-10000-0.1 OINT SMARTSIG:1 Inch(es) In Eye(s) Every Night 02/19/21   [provider]  Olopatadine HCl (PATADAY)  0.2 % SOLN Apply 1 drop to eye daily as needed. Patient taking differently: Apply 1 drop to eye daily as needed (For dry eyes). 03/13/22   Gladys Damme, MD  omeprazole (PRILOSEC) 40 MG capsule TAKE ONE CAPSULE BY MOUTH EVERY DAY Patient taking differently: Take 40 mg by mouth daily. 10/02/21   Lind Covert, MD  OXYGEN Inhale 2 L into the lungs at bedtime.    [provider]  PROAIR HFA 108 (90 Base) MCG/ACT inhaler INHALE 1 OR 2 PUFFS INTO THE LUNGS EVERY 6 HOURS AS NEEDED FOR WHEEZING OR SHORTNESS OF BREATH Patient taking differently: Inhale 1 puff into the lungs every 6 (six) hours as needed for shortness of breath. 10/26/18   Chesley Mires, MD  RESTASIS 0.05 % ophthalmic emulsion Place 1 drop into both eyes 2 (two) times daily. 02/20/21   [provider]  spironolactone (ALDACTONE) 25 MG tablet TAKE ONE-HALF TABLET BY MOUTH NIGHTLY AT BEDTIME Patient taking differently: Take 12.5 mg by mouth daily. 11/11/21   McDiarmid, Blane Ohara, MD  SYMBICORT 160-4.5 MCG/ACT inhaler INHALE 2 PUFFS INTO THE LUNGS TWO TIMES DAILY Patient taking differently: Inhale 2 puffs into the lungs daily. 10/26/18   Chesley Mires, MD  TRESIBA FLEXTOUCH 100 UNIT/ML FlexTouch Pen INJECT 10 UNITS UNDER THE SKIN EVERY DAY Patient taking differently: Inject 10 Units into the skin daily. 03/28/22   Gladys Damme, MD   Allergies    Lisinopril    Review of Systems   Review of Systems Negative except as per HPI Physical Exam Updated Vital Signs BP 117/85 (BP Location: Left Arm)   Pulse 68   Temp 98.1 F (36.7 C) (Oral)   Resp (!) 21   Ht 5' 3"  (1.6 m)   Wt 74.8 kg   LMP 12/05/2021   SpO2 99%   BMI 29.23 kg/m  Physical Exam Vitals and nursing note reviewed.  Constitutional:      General: She is not in acute distress.    Appearance: She is well-developed. She is not diaphoretic.  HENT:     Head: Normocephalic and atraumatic.  Cardiovascular:     Rate and Rhythm: Normal rate and regular rhythm.     Pulses: Normal pulses.     Heart sounds: Normal heart sounds.  Pulmonary:     Effort: Pulmonary effort is normal.     Breath sounds: Normal breath sounds.  Abdominal:     Palpations: Abdomen is soft.     Tenderness: There is no abdominal tenderness.  Skin:    General: Skin is warm and dry.     Findings: No erythema or rash.  Neurological:     Mental Status:  She is alert and oriented to person, place, and time.  Psychiatric:        Behavior: Behavior normal.    ED Results / Procedures / Treatments   Labs (all labs ordered are listed, but only abnormal results are displayed) Labs Reviewed  COMPREHENSIVE METABOLIC PANEL - Abnormal; Notable for the following components:      Result Value   Sodium 133 (*)    CO2 17 (*)    Glucose, Bld 318 (*)    Total Protein 8.2 (*)    All other components within normal limits  CBC - Abnormal; Notable for the following components:   WBC 3.8 (*)    All other components within normal limits  URINALYSIS, ROUTINE W REFLEX MICROSCOPIC - Abnormal; Notable for the following components:   APPearance HAZY (*)  Glucose, UA >=500 (*)    Protein, ur 30 (*)    All other components within normal limits  CBG MONITORING, ED - Abnormal; Notable for the following components:   Glucose-Capillary 327 (*)    All other components within normal limits  CBG MONITORING, ED - Abnormal; Notable for the following components:   Glucose-Capillary 276 (*)    All other components within normal limits  GASTROINTESTINAL PANEL BY PCR, STOOL (REPLACES STOOL CULTURE)  C DIFFICILE QUICK SCREEN W PCR REFLEX    LIPASE, BLOOD    EKG None  Radiology CT Abdomen Pelvis W Contrast  Result Date: 04/03/2022 CLINICAL DATA:  Abdominal pain and diarrhea for 4 days. EXAM: CT ABDOMEN AND PELVIS WITH CONTRAST TECHNIQUE: Multidetector CT imaging of the abdomen and pelvis was performed using the standard protocol following bolus administration of intravenous contrast. RADIATION DOSE REDUCTION: This exam was performed according to the departmental dose-optimization program which includes automated exposure control, adjustment of the mA and/or kV according to patient size and/or use of iterative reconstruction technique. CONTRAST:  149mL OMNIPAQUE IOHEXOL 300 MG/ML  SOLN COMPARISON:  None Available. FINDINGS: Lower chest: No acute abnormality.  Hepatobiliary: Small amount of focal fat along the gallbladder fossa. No other focal liver abnormality. Mildly distended gallbladder without wall thickening or gallstones. No biliary dilatation. Pancreas: Unremarkable. No pancreatic ductal dilatation or surrounding inflammatory changes. Spleen: Normal in size without focal abnormality. Adrenals/Urinary Tract: Adrenal glands are unremarkable. Kidneys are normal, without renal calculi, focal lesion, or hydronephrosis. Bladder is mostly decompressed. Stomach/Bowel: The stomach is within normal limits. No bowel wall thickening, distention, or surrounding inflammatory changes. Scattered small bowel mucosal enhancement. Liquid stool in the right colon. Diminutive or absent appendix. Vascular/Lymphatic: No significant vascular findings. Prominent subcentimeter mesenteric lymph nodes, likely reactive. Reproductive: Uterus and bilateral adnexa are unremarkable. Other: No abdominal wall hernia or abnormality. No abdominopelvic ascites. No pneumoperitoneum. Musculoskeletal: No acute or significant osseous findings. IMPRESSION: 1. Findings suggestive of enterocolitis. Electronically Signed   By: Titus Dubin M.D.   On: 04/03/2022 16:26    Procedures Procedures    Medications Ordered in ED Medications  iohexol (OMNIPAQUE) 300 MG/ML solution 100 mL (100 mLs Intravenous Contrast Given 04/03/22 1552)    ED Course/ Medical Decision Making/ A&P                           Medical Decision Making Amount and/or Complexity of Data Reviewed Labs: ordered. Radiology: ordered.  Risk Prescription drug management.   This patient presents to the ED for concern of diarrhea x 4 months, this involves an extensive number of treatment options, and is a complaint that carries with it a high risk of complications and morbidity.  The differential diagnosis includes enteritis, colitis, medication side effect, dehydration    Co morbidities that complicate the patient  evaluation  HIV, diabetes, CHF   Additional history obtained:  External records from outside source obtained and reviewed including labs and urgent care visit from 04/01/2022.  Office visit today with patient sent to ED for further evaluation given her abnormal labs were urgent care 2 days ago.   Lab Tests:  I Ordered, and personally interpreted labs.  The pertinent results include: CBC with white count 3.8, consistent with prior.  Urinalysis with greater than 500 glucose, protein, negative for ketones, no evidence of UTI.  CMP with elevated glucose of 319 with bicarb of 17, gap normal.  Lipase normal.   Imaging Studies  ordered:  I ordered imaging studies including CT abdomen pelvis I independently visualized and interpreted imaging which showed enteritis I agree with the radiologist interpretation   Consultations Obtained:  I requested consultation with the diabetes educator,  and discussed lab and imaging findings as well as pertinent plan - they recommend: In person consultation provided, education on use of patient's insulin and meter with plan for follow-up with PCP.  Also given referral to outpatient diabetes education and nutrition follow-up.   Problem List / ED Course / Critical interventions / Medication management  56 year old female presents with concern for diarrhea which has been ongoing for several months, onset after starting metformin, persist despite discontinuing her metformin.  Patient is a difficult historian, frustrated with her multiple encounters recently with having to retell her story and does not wish to provide much history to me at this visit.  Review of records, was recently seen in urgent care 2 days ago, had labs drawn and was advised to go to the emergency room for concern for DKA.  Patient presented to the emergency room however left due to extended wait.  Patient followed up in PCP clinic today and was again referred back to the emergency room for  evaluation.  Patient's labs were assessed today, have improved compared to 2 days ago, not found to be in DKA today.  Last A1c in April does show A1c of greater than 15, patient was recently started on insulin, does not understand nutrition/insulin/monitoring and is agreeable with plan for follow-up with diabetic counselor.  Fortunately, the diabetes educator was available in the department today and has that with the patient, feels confident patient will be able to use her meter and insulin properly, referral was placed to outpatient follow-up.  Patient reported abdominal pain in triage, denied abdominal pain at time of my exam, abdomen is found to be soft and nontender.  Due to her longstanding diarrhea and inability to complete colonoscopy outpatient, patient had a CT abdomen pelvis today which shows enterocolitis.  Plan was to collect stool cultures and test for C. difficile however despite several hours, patient was unable to provide a stool sample.  This can be followed up in PCP office if needed. I have reviewed the patients home medicines and have made adjustments as needed   Social Determinants of Health:  Poorly controlled diabetes, new to insulin    Test / Admission - Considered:  PCP note reviewed, consider IV fluids however due to history of CHF and labs today with improvement compared to prior on file, will withhold IV fluids at this time.         Final Clinical Impression(s) / ED Diagnoses Final diagnoses:  Enterocolitis  Uncontrolled other specified diabetes mellitus with hyperglycemia [facility])    Rx / DC Orders ED Discharge Orders          Ordered    Ambulatory referral to Nutrition and Diabetic Education        04/03/22 1643              Tacy Learn, PA-C 04/03/22 1830    Tegeler, Gwenyth Allegra, MD 04/04/22 [phone]

## 2022-04-04 LAB — COLOGUARD: COLOGUARD: NEGATIVE

## 2022-04-06 ENCOUNTER — Telehealth: Payer: Self-pay

## 2022-04-06 NOTE — Telephone Encounter (Signed)
Transition Care Management Unsuccessful Follow-up Telephone Call  Date of discharge and from where:  04/03/2022 from [facility]  Attempts:  1st Attempt  Reason for unsuccessful TCM follow-up call:  Left voice message   Appt w/PCP on 04/08/2022

## 2022-04-07 ENCOUNTER — Encounter: Payer: Self-pay | Admitting: Family Medicine

## 2022-04-07 ENCOUNTER — Other Ambulatory Visit: Payer: Self-pay | Admitting: Family Medicine

## 2022-04-07 NOTE — Progress Notes (Signed)
Negative cologuard. Normal result letter sent. Repeat cologuard in 1 year.  Gladys Damme, MD [place] [facility], PGY-3

## 2022-04-07 NOTE — Telephone Encounter (Signed)
Transition Care Management Unsuccessful Follow-up Telephone Call  Date of discharge and from where:  04/03/2022 from [facility]  Attempts:  2nd Attempt  Reason for unsuccessful TCM follow-up call:  Left voice message   Appt w/PCP on 04/08/2022

## 2022-04-08 ENCOUNTER — Other Ambulatory Visit: Payer: Self-pay | Admitting: Family Medicine

## 2022-04-08 ENCOUNTER — Telehealth: Payer: Self-pay | Admitting: Family Medicine

## 2022-04-08 ENCOUNTER — Ambulatory Visit: Payer: Medicaid Other | Admitting: Family Medicine

## 2022-04-08 DIAGNOSIS — E118 Type 2 diabetes mellitus with unspecified complications: Secondary | ICD-10-CM

## 2022-04-08 DIAGNOSIS — E782 Mixed hyperlipidemia: Secondary | ICD-10-CM

## 2022-04-08 NOTE — Progress Notes (Incomplete)
    SUBJECTIVE:   CHIEF COMPLAINT / HPI:   DM2: Patient had delayed initiation of   PERTINENT  PMH / PSH: ***  OBJECTIVE:   LMP 12/05/2021   ***  ASSESSMENT/PLAN:   No problem-specific Assessment & Plan notes found for this encounter.     Gladys Damme, MD [place]

## 2022-04-08 NOTE — Telephone Encounter (Signed)
Called patient as she missed appt with me today at 10:30 AM. Left HIPAA compliant VM. Patient needs appt to follow her diabetes as soon as possible.  Shirlean Mylar, MD [facility], PGY-3

## 2022-04-08 NOTE — Telephone Encounter (Signed)
Transition Care Management Unsuccessful Follow-up Telephone Call  Date of discharge and from where:  04/03/2022 from [facility]  Attempts:  3rd Attempt  Reason for unsuccessful TCM follow-up call:  Unable to reach patient

## 2022-04-10 ENCOUNTER — Ambulatory Visit (INDEPENDENT_AMBULATORY_CARE_PROVIDER_SITE_OTHER): Payer: Medicaid Other | Admitting: Family Medicine

## 2022-04-10 ENCOUNTER — Encounter: Payer: Self-pay | Admitting: Family Medicine

## 2022-04-10 VITALS — BP 115/75 | HR 75 | Ht 63.0 in | Wt 183.0 lb

## 2022-04-10 DIAGNOSIS — A09 Infectious gastroenteritis and colitis, unspecified: Secondary | ICD-10-CM | POA: Diagnosis not present

## 2022-04-10 DIAGNOSIS — E1165 Type 2 diabetes mellitus with hyperglycemia: Secondary | ICD-10-CM | POA: Diagnosis not present

## 2022-04-10 DIAGNOSIS — Z0000 Encounter for general adult medical examination without abnormal findings: Secondary | ICD-10-CM

## 2022-04-10 NOTE — Telephone Encounter (Signed)
Attempted to reach patient to make follow up appt on diabetes. No answer. LVM for patient to call the office to make appt. Aquilla Solian, CMA

## 2022-04-10 NOTE — Progress Notes (Signed)
SUBJECTIVE:   CHIEF COMPLAINT / HPI:   DM2: Patient had delayed initiation of insulin despite extensive counseling with both myself and Dr. Raymondo Band at her last visit with me in April. Since then, patient has followed up in urgent care, ED, and at [facility] with colleagues regarding sequelae of uncontrolled diabetes. Reviewed lab work from ED, no DKA, but concerningly high glucose. Patient has reportedly started using insulin and glucometer- had repeat education by diabetic educator in recent ED visit last week.  On May 19 in the ED, patient's UA showed glucose greater than 500, no ketones had glucose of 318, CO2 was 17, normal anion gap. Patient missed follow-up appointment with me on Wednesday.  Today reports she is taking [place] 12 units since Tuesday.  She is also prescribed metformin 1000 mg twice daily, however has not taking it due to recent episodes of diarrhea, dehydration. FBGs 247-298 in the last week. 7 ay FBG avg has 281.  Diarrhea: Patient had diarrhea that started round May 15th.  Patient has a longstanding history of diarrhea, and is immunocompromise given type 2 diabetes that is uncontrolled as well as HIV.  She was sent to the ED and found to have enterocolitis on abdominal CT imaging.  Today she reports the last episode of diarrhea Wednesday, she feels much better today.  She reports that she was volunteering in a school the week before she became ill and that her romantic partner also now has the same symptoms, indicating that her diarrhea was due to a likely viral illness.  Will check metabolic panel today.  Healthcare maintenance: Reminded patient of need to see ophthalmologist annually.  Patient is due for Pap smear, last completed November 13, 2020 which showed NILM.  Previous Pap smears include: -07/2019 L SIL, CIN-1, positive HPV - 12/2017 ASCUS, HPV positive - 09/2016 L SIL, CIN-1, positive HPV  PERTINENT  PMH / PSH: HIV, hypertension, CHF, type 2 diabetes  OBJECTIVE:   BP  115/75   Pulse 75   Ht 5\' 3"  (1.6 m)   Wt 183 lb (83 kg)   LMP 12/05/2021   SpO2 97%   BMI 32.42 kg/m   Nursing note and vitals reviewed GEN: Age-appropriate, AAW, resting comfortably in chair, NAD, class I obesity HEENT: NCAT. PERRLA. Sclera without injection or icterus. MMM.  Cardiac: Regular rate and rhythm. Normal S1/S2. No murmurs, rubs, or gallops appreciated. 2+ radial pulses. Lungs: Clear bilaterally to ascultation. No increased WOB, no accessory muscle usage. No w/r/r. Abdomen: Soft, NT, ND.  Normoactive bowel sounds.  Neuro: AOx3  Ext: no edema Psych: Pleasant and appropriate  ASSESSMENT/PLAN:   Type 2 diabetes mellitus with hyperglycemia (HCC) Will check BMP today given recent diarrhea illness and poorly controlled DM2.  It has made huge improvements in her management of diabetes.  She is currently taking [place] 12 units, has held metformin appropriately since having diarrhea and dehydration.  Given that her average blood sugar is 280, our first goal is to get her fasting blood glucose less than 200, second goal will be to get fasting blood glucose less than 80 and third and final goal is to get very good control of her diabetes with fasting blood glucose goal of 90-150. -For every fasting blood glucose above 250, add 2 units of Tresiba -For every fasting blood glucose above 200, add 1 unit of Tresiba -Continue daily monitoring of fasting blood glucose -Check BMP -Follow-up in 1 week to ensure patient is on right track with insulin  regimen, then follow-up with me when available later in the month  Healthcare maintenance Patient needs Pap smear.  She has upcoming appointment with Dr. Elgie Congo on June 1.  She reports this is where she gets her Pap smear done.  Infectious colitis, enteritis and gastroenteritis Patient improved the last 2 days, continue to monitor for symptoms.  Will check BMP.     Gladys Damme, MD [place]

## 2022-04-10 NOTE — Patient Instructions (Addendum)
It was a pleasure to see you today!  Our goals for your diabetes are as follows: To get you to your first goal: fasting blood glucose less than 200 every time your first blood sugar of the day is above 250, increase tresiba (insulin) by 2 units So if tomorrow morning, your first blood sugar of the day before food is 260, increase insulin to 14 units (since you are at 12 today) Every time your blood sugar is above 200, but less than 250, increase tresiba (insulin) by 1 unit So if tomorrow your first blood sugar of the day before food is 240, increase insulin to 13 units (since you are at 12 today) Follow up next week on Friday June 2nd to make sure your blood sugar is improving We will get some labs today.  If they are abnormal or we need to do something about them, I will call you.  If they are normal, I will send you a message on MyChart (if it is active) or a letter in the mail.  If you don't hear from [place] in 2 weeks, please call the office  [phone]. If you are feeling sick or have more diarrhea, please call our office and we will see you sooner You have a follow up with me at my next available time on June 20th KEEP UP THE GOOD WORK! You will get results!  Be Well,  Dr. Leary Roca

## 2022-04-10 NOTE — Assessment & Plan Note (Signed)
Patient needs Pap smear.  She has upcoming appointment with Dr. Donavan Foil on June 1.  She reports this is where she gets her Pap smear done.

## 2022-04-10 NOTE — Assessment & Plan Note (Signed)
Will check BMP today given recent diarrhea illness and poorly controlled DM2.  It has made huge improvements in her management of diabetes.  She is currently taking [place] 12 units, has held metformin appropriately since having diarrhea and dehydration.  Given that her average blood sugar is 280, our first goal is to get her fasting blood glucose less than 200, second goal will be to get fasting blood glucose less than 80 and third and final goal is to get very good control of her diabetes with fasting blood glucose goal of 90-150. -For every fasting blood glucose above 250, add 2 units of Tresiba -For every fasting blood glucose above 200, add 1 unit of Tresiba -Continue daily monitoring of fasting blood glucose -Check BMP -Follow-up in 1 week to ensure patient is on right track with insulin regimen, then follow-up with me when available later in the month

## 2022-04-10 NOTE — Assessment & Plan Note (Signed)
Patient improved the last 2 days, continue to monitor for symptoms.  Will check BMP.

## 2022-04-11 LAB — BASIC METABOLIC PANEL
BUN/Creatinine Ratio: 10 (ref 9–23)
BUN: 8 mg/dL (ref 6–24)
CO2: 29 mmol/L (ref 20–29)
Calcium: 10 mg/dL (ref 8.7–10.2)
Chloride: 99 mmol/L (ref 96–106)
Creatinine, Ser: 0.82 mg/dL (ref 0.57–1.00)
Glucose: 229 mg/dL — ABNORMAL HIGH (ref 70–99)
Potassium: 3.6 mmol/L (ref 3.5–5.2)
Sodium: 142 mmol/L (ref 134–144)
eGFR: 85 mL/min/{1.73_m2} (ref >59)

## 2022-04-15 NOTE — Telephone Encounter (Signed)
Patient has appt in ATC on 6/2 at 10:10.

## 2022-04-16 ENCOUNTER — Other Ambulatory Visit (HOSPITAL_COMMUNITY)
Admission: RE | Admit: 2022-04-16 | Discharge: 2022-04-16 | Disposition: A | Discharge status: Home / Self Care | Payer: Medicaid Other | Source: Ambulatory Visit | Attending: Obstetrics and Gynecology | Admitting: Obstetrics and Gynecology

## 2022-04-16 ENCOUNTER — Ambulatory Visit (INDEPENDENT_AMBULATORY_CARE_PROVIDER_SITE_OTHER): Payer: Medicaid Other | Admitting: Obstetrics and Gynecology

## 2022-04-16 ENCOUNTER — Encounter: Payer: Self-pay | Admitting: Obstetrics and Gynecology

## 2022-04-16 DIAGNOSIS — N898 Other specified noninflammatory disorders of vagina: Secondary | ICD-10-CM | POA: Insufficient documentation

## 2022-04-16 DIAGNOSIS — R062 Wheezing: Secondary | ICD-10-CM | POA: Diagnosis not present

## 2022-04-16 DIAGNOSIS — G4733 Obstructive sleep apnea (adult) (pediatric): Secondary | ICD-10-CM | POA: Diagnosis not present

## 2022-04-16 DIAGNOSIS — R0902 Hypoxemia: Secondary | ICD-10-CM | POA: Diagnosis not present

## 2022-04-16 DIAGNOSIS — B20 Human immunodeficiency virus [HIV] disease: Secondary | ICD-10-CM | POA: Diagnosis not present

## 2022-04-16 MED ORDER — FLUCONAZOLE 150 MG PO TABS: 150.0000 mg | ORAL_TABLET | Freq: Once | ORAL | 2 refills | Status: AC

## 2022-04-16 NOTE — Progress Notes (Signed)
  CC: vaginal discharge Subjective:    Patient ID: Leslie Gates, female    DOB: Jul 02, 1966, 56 y.o.   MRN: [id number]  HPI 56 yo G3P3 seen for vaginal discharge.  Pt notes 1 month history of discharge with occasional odor.  Pt stated the discharge looked like cottage cheese.   Review of Systems     Objective:   Physical Exam Vitals:   04/16/22 0839  BP: 126/84  Pulse: 64   SSE:  scant gray discharge, no odor detected. Vaginal swab obtained      Assessment & Plan:   1. Vaginal discharge Will treat empirically with diflucan pending swab results  - Cervicovaginal ancillary only( [place]) - fluconazole (DIFLUCAN) 150 MG tablet; Take 1 tablet (150 mg total) by mouth once for 1 dose.  Dispense: 1 tablet; Refill: 2   F/u prn  Warden Fillers, MD Faculty Attending, [facility]

## 2022-04-16 NOTE — Progress Notes (Signed)
Patient presents for having vaginal discharge and irritation. Sx has been present for about a month. Denies odor. No other concerns.  Last Pap: 11/13/20 Normal

## 2022-04-16 NOTE — Progress Notes (Signed)
    SUBJECTIVE:   CHIEF COMPLAINT / HPI: DM f/u  DM Leslie Gates presents for follow-up for type 2 diabetes.  She was recently evaluated by her PCP to follow-up for her diabetes.  At that time she was told to increase her [place] by 2 units for every fasting blood glucose greater than 250 as well as to increase her Tresiba by 1 unit for every fasting blood glucose greater than 200.   Today she states that her recent blood glucoses have ranged from 210s to 249 She states that her fasting blood glucose this morning was 219 She reports that her diet has been improved. She gives example of having a meat for protein and more vegetables. She only really likes zucchini, squash and broccoli. She has been working to eat more fresh vegetables.  Current other medications include metformin 1000 mg twice daily.  She has been using the [place] and she has been using 12 units.  She reports that she has been better with her diet but does have to make herself drink water. She worries that she is not taking in enough fluids.     PERTINENT  PMH / PSH:  Type 2 diabetes  OBJECTIVE:   BP 118/80   Pulse 81   Wt 175 lb 3.2 oz (79.5 kg)   LMP 12/05/2021   SpO2 96%   BMI 31.04 kg/m   Physical Exam Constitutional:      Appearance: She is normal weight.  HENT:     Right Ear: External ear normal.     Left Ear: External ear normal.     Nose: Nose normal.     Mouth/Throat:     Pharynx: Oropharynx is clear.  Cardiovascular:     Rate and Rhythm: Normal rate and regular rhythm.  Pulmonary:     Effort: Pulmonary effort is normal.  Abdominal:     General: Bowel sounds are normal.     Palpations: Abdomen is soft.  Musculoskeletal:     Cervical back: Normal range of motion.  Skin:    General: Skin is warm.  Neurological:     General: No focal deficit present.     Mental Status: She is alert and oriented to person, place, and time.    ASSESSMENT/PLAN:   Type 2 diabetes mellitus with hyperglycemia  (HCC) Patient presents for follow-up of type 2 diabetes with hyperglycemia Review previous plan of Tresiba increased depending on fasting blood glucose levels Patient continues to have ranges in the 200s but appear to be less than 250 with her maxamine 249 Fasting today was 219 Recommended the patient start taking 13 units of Tresiba daily Patient scheduled previously for follow-up on 05/05/2022     Ronnald Ramp, MD [facility]

## 2022-04-17 ENCOUNTER — Other Ambulatory Visit: Payer: Self-pay | Admitting: Family Medicine

## 2022-04-17 ENCOUNTER — Ambulatory Visit (INDEPENDENT_AMBULATORY_CARE_PROVIDER_SITE_OTHER): Payer: Medicaid Other | Admitting: Family Medicine

## 2022-04-17 VITALS — BP 118/80 | HR 81 | Wt 175.2 lb

## 2022-04-17 DIAGNOSIS — E1165 Type 2 diabetes mellitus with hyperglycemia: Secondary | ICD-10-CM | POA: Diagnosis present

## 2022-04-17 DIAGNOSIS — Z794 Long term (current) use of insulin: Secondary | ICD-10-CM | POA: Diagnosis not present

## 2022-04-17 DIAGNOSIS — I5032 Chronic diastolic (congestive) heart failure: Secondary | ICD-10-CM

## 2022-04-17 LAB — CERVICOVAGINAL ANCILLARY ONLY
Bacterial Vaginitis (gardnerella): NEGATIVE
Candida Glabrata: NEGATIVE
Candida Vaginitis: NEGATIVE
Comment: NEGATIVE
Comment: NEGATIVE
Comment: NEGATIVE
Comment: NEGATIVE
Trichomonas: NEGATIVE

## 2022-04-17 NOTE — Assessment & Plan Note (Signed)
Patient presents for follow-up of type 2 diabetes with hyperglycemia Review previous plan of Tresiba increased depending on fasting blood glucose levels Patient continues to have ranges in the 200s but appear to be less than 250 with her maxamine 249 Fasting today was 219 Recommended the patient start taking 13 units of Tresiba daily Patient scheduled previously for follow-up on 05/05/2022

## 2022-04-17 NOTE — Patient Instructions (Addendum)
Please start taking 13 units of your [place] daily on tomorrow.  I recommended she take 1 additional unit of the [place] today to have a total of 13 for today.    For every blood glucose greater than 250, increase your tresiba by 2 units   For every blood glucose greater than 200, increase your tresiba by 1 unit.   Please follow up as scheduled with Dr. Leary Roca on 05/05/22  Diabetes Mellitus and Nutrition, Adult    Reading food labels Start by checking the serving size on the Nutrition Facts label of packaged foods and drinks. The number of calories and the amount of carbs, fats, and other nutrients listed on the label are based on one serving of the item. Many items contain more than one serving per package. Check the total grams (g) of carbs in one serving. Check the number of grams of saturated fats and trans fats in one serving. Choose foods that have a low amount or none of these fats. Check the number of milligrams (mg) of salt (sodium) in one serving. Most people should limit total sodium intake to less than 2,300 mg per day. Always check the nutrition information of foods labeled as "low-fat" or "nonfat." These foods may be higher in added sugar or refined carbs and should be avoided. Talk to your dietitian to identify your daily goals for nutrients listed on the label. Shopping Avoid buying canned, pre-made, or processed foods. These foods tend to be high in fat, sodium, and added sugar. Shop around the outside edge of the grocery store. This is where you will most often find fresh fruits and vegetables, bulk grains, fresh meats, and fresh dairy products. Cooking Use low-heat cooking methods, such as baking, instead of high-heat cooking methods, such as deep frying. Cook using healthy oils, such as olive, canola, or sunflower oil. Avoid cooking with butter, cream, or high-fat meats. Meal planning Eat meals and snacks regularly, preferably at the same times every day. Avoid going  long periods of time without eating. Eat foods that are high in fiber, such as fresh fruits, vegetables, beans, and whole grains. Eat 4-6 oz (112-168 g) of lean protein each day, such as lean meat, chicken, fish, eggs, or tofu. One ounce (oz) (28 g) of lean protein is equal to: 1 oz (28 g) of meat, chicken, or fish. 1 egg.  cup (62 g) of tofu. Eat some foods each day that contain healthy fats, such as avocado, nuts, seeds, and fish. What foods should I eat? Fruits Berries. Apples. Oranges. Peaches. Apricots. Plums. Grapes. Mangoes. Papayas. Pomegranates. Kiwi. Cherries. Vegetables Leafy greens, including lettuce, spinach, kale, chard, collard greens, mustard greens, and cabbage. Beets. Cauliflower. Broccoli. Carrots. Green beans. Tomatoes. Peppers. Onions. Cucumbers. Brussels sprouts. Grains Whole grains, such as whole-wheat or whole-grain bread, crackers, tortillas, cereal, and pasta. Unsweetened oatmeal. Quinoa. Brown or wild rice. Meats and other proteins Seafood. Poultry without skin. Lean cuts of poultry and beef. Tofu. Nuts. Seeds. Dairy Low-fat or fat-free dairy products such as milk, yogurt, and cheese. The items listed above may not be a complete list of foods and beverages you can eat and drink. Contact a dietitian for more information. What foods should I avoid? Fruits Fruits canned with syrup. Vegetables Canned vegetables. Frozen vegetables with butter or cream sauce. Grains Refined white flour and flour products such as bread, pasta, snack foods, and cereals. Avoid all processed foods. Meats and other proteins Fatty cuts of meat. Poultry with skin. Breaded or fried meats.  Processed meat. Avoid saturated fats. Dairy Full-fat yogurt, cheese, or milk. Beverages Sweetened drinks, such as soda or iced tea. The items listed above may not be a complete list of foods and beverages you should avoid. Contact a dietitian for more information. Questions to ask a health care  provider Do I need to meet with a certified diabetes care and education specialist? Do I need to meet with a dietitian? What number can I call if I have questions? When are the best times to check my blood glucose? Where to find more information: American Diabetes Association: diabetes.org Academy of Nutrition and Dietetics: eatright.[employer] of Diabetes and Digestive and Kidney Diseases: StageSync.si Association of Diabetes Care & Education Specialists: diabeteseducator.org Summary It is important to have healthy eating habits because your blood sugar (glucose) levels are greatly affected by what you eat and drink. It is important to use alcohol carefully. A healthy meal plan will help you manage your blood glucose and lower your risk of heart disease. Your health care provider may recommend that you work with a dietitian to make a meal plan that is best for you. This information is not intended to replace advice given to you by your health care provider. Make sure you discuss any questions you have with your health care provider. Document Revised: 06/05/2020 Document Reviewed: 06/05/2020 Elsevier Patient Education  2023 [employer].

## 2022-04-21 ENCOUNTER — Other Ambulatory Visit: Payer: Self-pay | Admitting: Family Medicine

## 2022-04-21 ENCOUNTER — Encounter: Payer: Self-pay | Admitting: *Deleted

## 2022-04-21 DIAGNOSIS — I5032 Chronic diastolic (congestive) heart failure: Secondary | ICD-10-CM

## 2022-05-05 ENCOUNTER — Ambulatory Visit: Payer: Medicaid Other | Admitting: Family Medicine

## 2022-05-05 NOTE — Progress Notes (Deleted)
    SUBJECTIVE:   CHIEF COMPLAINT / HPI:   DM2: Current insulin usage is [place] 13U daily. FBGs have been *** at home.  PERTINENT  PMH / PSH: ***  OBJECTIVE:   LMP 12/05/2021   ***  ASSESSMENT/PLAN:   No problem-specific Assessment & Plan notes found for this encounter.     Shirlean Mylar, MD [facility]

## 2022-05-12 ENCOUNTER — Other Ambulatory Visit: Payer: Self-pay

## 2022-05-12 ENCOUNTER — Other Ambulatory Visit: Payer: Medicaid Other

## 2022-05-12 DIAGNOSIS — Z113 Encounter for screening for infections with a predominantly sexual mode of transmission: Secondary | ICD-10-CM

## 2022-05-12 DIAGNOSIS — B20 Human immunodeficiency virus [HIV] disease: Secondary | ICD-10-CM

## 2022-05-13 ENCOUNTER — Other Ambulatory Visit: Payer: Medicaid Other

## 2022-05-13 ENCOUNTER — Other Ambulatory Visit: Payer: Self-pay

## 2022-05-13 ENCOUNTER — Encounter: Payer: Self-pay | Admitting: Family Medicine

## 2022-05-13 ENCOUNTER — Ambulatory Visit (INDEPENDENT_AMBULATORY_CARE_PROVIDER_SITE_OTHER): Payer: Medicaid Other | Admitting: Family Medicine

## 2022-05-13 ENCOUNTER — Other Ambulatory Visit (HOSPITAL_COMMUNITY)
Admission: RE | Admit: 2022-05-13 | Discharge: 2022-05-13 | Disposition: A | Discharge status: Home / Self Care | Payer: Medicaid Other | Source: Ambulatory Visit | Attending: Internal Medicine | Admitting: Internal Medicine

## 2022-05-13 VITALS — BP 120/72 | HR 85 | Resp 18 | Wt 182.4 lb

## 2022-05-13 DIAGNOSIS — B20 Human immunodeficiency virus [HIV] disease: Secondary | ICD-10-CM

## 2022-05-13 DIAGNOSIS — N871 Moderate cervical dysplasia: Secondary | ICD-10-CM | POA: Diagnosis not present

## 2022-05-13 DIAGNOSIS — Z113 Encounter for screening for infections with a predominantly sexual mode of transmission: Secondary | ICD-10-CM | POA: Insufficient documentation

## 2022-05-13 DIAGNOSIS — E1165 Type 2 diabetes mellitus with hyperglycemia: Secondary | ICD-10-CM

## 2022-05-13 DIAGNOSIS — Z794 Long term (current) use of insulin: Secondary | ICD-10-CM

## 2022-05-13 MED ORDER — INSULIN PEN NEEDLE 31G X 5 MM MISC: 1.0000 | Freq: Every day | 3 refills | Status: DC

## 2022-05-13 NOTE — Assessment & Plan Note (Signed)
  Current Regimen: Tresiba 14 units daily CBGs: FBG average less than 180 Last A1c: Greater than 15% on March 13, 2022 Denies polyuria, polydipsia, hypoglycemia  Last Eye Exam: In 2022 Statin: Yes ACE/ARB: Patient is wary of ARB's as she had history of angioedema with ACE inhibitor, blood pressure has been well controlled, can start on low-dose and have this discussion with her future. Neck step is to titrate oral medications such as metformin or SGLT2 inhibitor as patient is beginning to reach a steady state of insulin.  She is working very hard on her diet. Next appointment June 12, 2022 to recheck A1c, consider starting metformin and titrating insulin down as well as GLP-1 or SGLT2 at that visit.

## 2022-05-13 NOTE — Patient Instructions (Signed)
It was a pleasure to see you today!  Keep up the EXCELLENT work with your diabetes! Follow up in 1 month to check your A1c Make an appt with Dr. Laverna Peace office for a colposcopy or a pap smear Make an appt with an eye doctor to have your eyes checked this year  Be Well,  Dr. Leary Roca

## 2022-05-13 NOTE — Assessment & Plan Note (Signed)
Patient with HIV with history of 3 HPV positive Paps with L SIL or ASCUS from 2017, 2019, 2020.  Last Pap smear November 13, 2020 that was NILM.  Patient needs a colposcopy or another Pap smear with cotesting.  Recommend she make a follow-up appointment with OB/GYN per her preference and have this done as soon as possible.

## 2022-05-13 NOTE — Progress Notes (Signed)
    SUBJECTIVE:   CHIEF COMPLAINT / HPI:   DM2: Patient is currently on 14 units of Tresiba.  Her fasting blood glucose has been ranging from 106-180, most in 130s-140s. Only one out of range BG for month of June. Patient has been instituting good dietary goals being them.  She has her own garden and grows many of her own vegetables.  H/o abnormal pap smear:  Patient is due for Pap smear, last completed November 13, 2020 which showed NILM.  Previous Pap smears as below. Per ASCCP guidelines, patient with 2 positive HPV should have colposcopy. Discussed with patient and she prefers to have Pap smear done with OB/GYN.  Recommend she call and make an appointment as soon as possible, Morrie Sheldon considering this patient is immunocompromise with HIV. -07/2019 L SIL, CIN-1, positive HPV - 12/2017 ASCUS, HPV positive - 09/2016 L SIL, CIN-1, positive HPV  PERTINENT  PMH / PSH: HIV, type 2 diabetes, history of abnormal Pap smear  OBJECTIVE:   BP 120/72   Pulse 85   Resp 18   Wt 182 lb 6 oz (82.7 kg)   LMP 12/05/2021   SpO2 100%   BMI 32.31 kg/m   Nursing note and vitals reviewed GEN: Age-appropriate, AAW, resting comfortably in chair, NAD, class I obesity Cardiac: Regular rate and rhythm. Normal S1/S2. No murmurs, rubs, or gallops appreciated. 2+ radial pulses. Lungs: Clear bilaterally to ascultation. No increased WOB, no accessory muscle usage. No w/r/r. Neuro: AOx3  Ext: no edema Psych: Pleasant and appropriate  ASSESSMENT/PLAN:   Type 2 diabetes mellitus with hyperglycemia (HCC)  Current Regimen: Tresiba 14 units daily CBGs: FBG average less than 180 Last A1c: Greater than 15% on March 13, 2022 Denies polyuria, polydipsia, hypoglycemia  Last Eye Exam: In 2022 Statin: Yes ACE/ARB: Patient is wary of ARB's as she had history of angioedema with ACE inhibitor, blood pressure has been well controlled, can start on low-dose and have this discussion with her future. Neck step is to titrate  oral medications such as metformin or SGLT2 inhibitor as patient is beginning to reach a steady state of insulin.  She is working very hard on her diet. Next appointment June 12, 2022 to recheck A1c, consider starting metformin and titrating insulin down as well as GLP-1 or SGLT2 at that visit.    Moderate dysplasia of cervix Patient with HIV with history of 3 HPV positive Paps with L SIL or ASCUS from 2017, 2019, 2020.  Last Pap smear November 13, 2020 that was NILM.  Patient needs a colposcopy or another Pap smear with cotesting.  Recommend she make a follow-up appointment with OB/GYN per her preference and have this done as soon as possible.     Shirlean Mylar, MD [facility]

## 2022-05-14 LAB — T-HELPER CELL (CD4) - (RCID CLINIC ONLY)
CD4 % Helper T Cell: 37 % (ref 33–65)
CD4 T Cell Abs: 642 /uL (ref 400–1790)

## 2022-05-14 LAB — URINE CYTOLOGY ANCILLARY ONLY
Chlamydia: NEGATIVE
Comment: NEGATIVE
Comment: NORMAL
Neisseria Gonorrhea: NEGATIVE

## 2022-05-15 ENCOUNTER — Other Ambulatory Visit: Payer: Self-pay | Admitting: Family Medicine

## 2022-05-15 DIAGNOSIS — E782 Mixed hyperlipidemia: Secondary | ICD-10-CM

## 2022-05-16 DIAGNOSIS — B20 Human immunodeficiency virus [HIV] disease: Secondary | ICD-10-CM | POA: Diagnosis not present

## 2022-05-16 DIAGNOSIS — R062 Wheezing: Secondary | ICD-10-CM | POA: Diagnosis not present

## 2022-05-16 DIAGNOSIS — R0902 Hypoxemia: Secondary | ICD-10-CM | POA: Diagnosis not present

## 2022-05-16 DIAGNOSIS — G4733 Obstructive sleep apnea (adult) (pediatric): Secondary | ICD-10-CM | POA: Diagnosis not present

## 2022-05-16 LAB — CBC WITH DIFFERENTIAL/PLATELET
Absolute Monocytes: 390 cells/uL (ref 200–950)
Basophils Absolute: 61 cells/uL (ref 0–200)
Basophils Relative: 1.3 %
Eosinophils Absolute: 141 cells/uL (ref 15–500)
Eosinophils Relative: 3 %
HCT: 37.8 % (ref 35.0–45.0)
Hemoglobin: 12.8 g/dL (ref 11.7–15.5)
Lymphs Abs: 1918 cells/uL (ref 850–3900)
MCH: 30 pg (ref 27.0–33.0)
MCHC: 33.9 g/dL (ref 32.0–36.0)
MCV: 88.7 fL (ref 80.0–100.0)
MPV: 9.9 fL (ref 7.5–12.5)
Monocytes Relative: 8.3 %
Neutro Abs: 2190 cells/uL (ref 1500–7800)
Neutrophils Relative %: 46.6 %
Platelets: 360 10*3/uL (ref 140–400)
RBC: 4.26 10*6/uL (ref 3.80–5.10)
RDW: 12 % (ref 11.0–15.0)
Total Lymphocyte: 40.8 %
WBC: 4.7 10*3/uL (ref 3.8–10.8)

## 2022-05-16 LAB — COMPLETE METABOLIC PANEL WITH GFR
AG Ratio: 1.2 (calc) (ref 1.0–2.5)
ALT: 13 U/L (ref 6–29)
AST: 16 U/L (ref 10–35)
Albumin: 4.2 g/dL (ref 3.6–5.1)
Alkaline phosphatase (APISO): 75 U/L (ref 37–153)
BUN: 15 mg/dL (ref 7–25)
CO2: 31 mmol/L (ref 20–32)
Calcium: 10.1 mg/dL (ref 8.6–10.4)
Chloride: 100 mmol/L (ref 98–110)
Creat: 0.67 mg/dL (ref 0.50–1.03)
Globulin: 3.5 g/dL (calc) (ref 1.9–3.7)
Glucose, Bld: 131 mg/dL — ABNORMAL HIGH (ref 65–99)
Potassium: 3.7 mmol/L (ref 3.5–5.3)
Sodium: 139 mmol/L (ref 135–146)
Total Bilirubin: 0.4 mg/dL (ref 0.2–1.2)
Total Protein: 7.7 g/dL (ref 6.1–8.1)
eGFR: 104 mL/min/{1.73_m2} (ref >=60)

## 2022-05-16 LAB — HIV-1 RNA QUANT-NO REFLEX-BLD
HIV 1 RNA Quant: NOT DETECTED Copies/mL
HIV-1 RNA Quant, Log: NOT DETECTED Log cps/mL

## 2022-05-16 LAB — RPR: RPR Ser Ql: NONREACTIVE

## 2022-05-24 ENCOUNTER — Other Ambulatory Visit: Payer: Self-pay | Admitting: Family Medicine

## 2022-05-24 DIAGNOSIS — I10 Essential (primary) hypertension: Secondary | ICD-10-CM

## 2022-05-26 ENCOUNTER — Encounter: Payer: Self-pay | Admitting: Family Medicine

## 2022-05-26 ENCOUNTER — Other Ambulatory Visit: Payer: Self-pay

## 2022-05-26 ENCOUNTER — Ambulatory Visit (INDEPENDENT_AMBULATORY_CARE_PROVIDER_SITE_OTHER): Payer: Medicaid Other | Admitting: Internal Medicine

## 2022-05-26 VITALS — BP 114/74 | HR 84 | Temp 98.6°F | Wt 181.0 lb

## 2022-05-26 DIAGNOSIS — B20 Human immunodeficiency virus [HIV] disease: Secondary | ICD-10-CM

## 2022-05-26 DIAGNOSIS — E119 Type 2 diabetes mellitus without complications: Secondary | ICD-10-CM

## 2022-05-26 DIAGNOSIS — Z79899 Other long term (current) drug therapy: Secondary | ICD-10-CM

## 2022-05-26 MED ORDER — BIKTARVY 50-200-25 MG PO TABS: 1.0000 | ORAL_TABLET | Freq: Every day | ORAL | 11 refills | Status: DC

## 2022-05-26 NOTE — Progress Notes (Deleted)
RFV: follow up for hiv disease  Patient ID: Leslie Gates, female   DOB: [birth year], 56 y.o.   MRN: [id number]  HPI 56yo F with well controlled hiv disease, CD 4 count of 642/VL<20 in June 2023 on biktarvy.   Has much improved diabetes.had BS in 600s. Now on insulin including triseba. And doming much better. Had also colonsocopy was good. Now predominantly vegetarian. Went to [employer] class to cultivate (June 22)  Almost lost 30 lb since comparing to visit from last Fall.  Outpatient Encounter Medications as of 05/26/2022  Medication Sig   ACCU-CHEK GUIDE test strip USE TO TEST BLOOD SUGAR FOUR TIMES DAILY AS DIRECTED   Accu-Chek Softclix Lancets lancets USE TO TEST BLOOD SUGAR FOUR TIMES DAILY AS DIRECTED   amLODipine (NORVASC) 10 MG tablet TAKE ONE TABLET BY MOUTH AT NIGHT. - NEEDS APPT FOR FURTURE REFILLS- (Patient taking differently: Take 10 mg by mouth daily.)   atorvastatin (LIPITOR) 40 MG tablet TAKE ONE TABLET BY MOUTH DAILY - NEEDS APPT FOR FUTURE REFILLS-   BIKTARVY 50-200-25 MG TABS tablet TAKE ONE TABLET BY MOUTH EVERY DAY ***this replaces genvoya*** (Patient taking differently: Take 1 tablet by mouth daily.)   blood glucose meter kit and supplies KIT Dispense based on patient and insurance preference. Use up to four times daily as directed.   Budeson-Glycopyrrol-Formoterol (BREZTRI AEROSPHERE) 160-9-4.8 MCG/ACT AERO Inhale 160 mcg into the lungs 2 (two) times daily.   fexofenadine (ALLEGRA) 180 MG tablet Take 1 tablet (180 mg total) by mouth daily.   furosemide (LASIX) 20 MG tablet TAKE ONE TABLET BY MOUTH EVERY DAY. MAY TAKE EXTRA TABLET EVERY DAY AS NEEDED FOR WEIGHT GAIN 3 LBS OR MORE OVERNIGHT   Insulin Pen Needle 31G X 5 MM MISC 1 each by Does not apply route daily.   metFORMIN (GLUCOPHAGE-XR) 500 MG 24 hr tablet Take 2 tablets (1,000 mg total) by mouth in the morning and at bedtime.   Multiple Vitamin (TAB-A-VITE) TABS TAKE ONE TABLET BY MOUTH EVERY DAY   NAPHCON-A  0.025-0.3 % ophthalmic solution INSTILL 1 DROP INTO BOTH EYES FOUR TIMES A DAY AS NEEDED FOR IRRITATION (Patient taking differently: Place 1 drop into both eyes 4 (four) times daily as needed for eye irritation.)   neomycin-polymyxin b-dexamethasone (MAXITROL) 3.5-10000-0.1 OINT SMARTSIG:1 Inch(es) In Eye(s) Every Night   Olopatadine HCl (PATADAY) 0.2 % SOLN Apply 1 drop to eye daily as needed. (Patient taking differently: Apply 1 drop to eye daily as needed (For dry eyes).)   omeprazole (PRILOSEC) 40 MG capsule TAKE ONE CAPSULE BY MOUTH EVERY DAY (Patient taking differently: Take 40 mg by mouth daily.)   OXYGEN Inhale 2 L into the lungs at bedtime.   PROAIR HFA 108 (90 Base) MCG/ACT inhaler INHALE 1 OR 2 PUFFS INTO THE LUNGS EVERY 6 HOURS AS NEEDED FOR WHEEZING OR SHORTNESS OF BREATH (Patient taking differently: Inhale 1 puff into the lungs every 6 (six) hours as needed for shortness of breath.)   RESTASIS 0.05 % ophthalmic emulsion Place 1 drop into both eyes 2 (two) times daily.   spironolactone (ALDACTONE) 25 MG tablet TAKE ONE-HALF TABLET BY MOUTH NIGHTLY AT BEDTIME   SYMBICORT 160-4.5 MCG/ACT inhaler INHALE 2 PUFFS INTO THE LUNGS TWO TIMES DAILY (Patient taking differently: Inhale 2 puffs into the lungs daily.)   TRESIBA FLEXTOUCH 100 UNIT/ML FlexTouch Pen INJECT 10 UNITS UNDER THE SKIN EVERY DAY (Patient taking differently: Inject 10 Units into the skin daily.)   [DISCONTINUED] spironolactone (ALDACTONE)  25 MG tablet TAKE ONE-HALF TABLET BY MOUTH NIGHTLY AT BEDTIME (Patient taking differently: Take 12.5 mg by mouth daily.)   No facility-administered encounter medications on file as of 05/26/2022.     Patient Active Problem List   Diagnosis Date Noted   Healthcare maintenance 04/10/2022   Type 2 diabetes mellitus with diabetic neuropathy, unspecified ([place]) 03/13/2022   Dysplasia of cervix, low grade (CIN 1) 11/13/2020   Type 2 diabetes mellitus with hyperglycemia ([place]) 12/15/2019    Hyperlipidemia associated with type 2 diabetes mellitus ([place]) 04/29/2018   Chronic diastolic heart failure ([place]) 06/25/2017   Moderate dysplasia of cervix 07/10/2013   History of angioedema 09/09/2011   Obesity hypoventilation syndrome ([place]) 07/10/2010   Obesity, Class I, BMI 30-34.9 07/01/2010   Obstructive sleep apnea 06/27/2010   Pulmonary hypertension ([place]) 05/28/2010   Hypertension associated with type 2 diabetes mellitus ([place]) 03/22/2007   HIV disease ([place]) 06/21/2002     Health Maintenance Due  Topic Date Due   PAP SMEAR-Modifier  11/13/2021   OPHTHALMOLOGY EXAM  12/20/2021     Review of Systems 12 point ros is negative except what is mentioned above Physical Exam   BP 114/74   Pulse 84   Temp 98.6 F (37 C) (Oral)   Wt 181 lb (82.1 kg)   LMP 12/05/2021   BMI 32.06 kg/m   Physical Exam  Constitutional:  oriented to person, place, and time. appears well-developed and well-nourished. No distress.  HENT: [place]/AT, PERRLA, no scleral icterus Mouth/Throat: Oropharynx is clear and moist. No oropharyngeal exudate.  Cardiovascular: Normal rate, regular rhythm and normal heart sounds. Exam reveals no gallop and no friction rub.  No murmur heard.  Pulmonary/Chest: Effort normal and breath sounds normal. No respiratory distress.  has no wheezes.  Neck = supple, no nuchal rigidity Abdominal: Soft. Bowel sounds are normal.  exhibits no distension. There is no tenderness.  Lymphadenopathy: no cervical adenopathy. No axillary adenopathy Neurological: alert and oriented to person, place, and time.  Skin: Skin is warm and dry. No rash noted. No erythema.  Psychiatric: a normal mood and affect.  behavior is normal.   Lab Results  Component Value Date   CD4TCELL 37 05/13/2022   Lab Results  Component Value Date   CD4TABS 642 05/13/2022   CD4TABS 468 09/29/2021   CD4TABS 747 02/20/2021   Lab Results  Component Value Date   HIV1RNAQUANT Not Detected 05/13/2022   Lab Results   Component Value Date   HEPBSAB NEG 02/08/2009   Lab Results  Component Value Date   LABRPR NON-REACTIVE 05/13/2022    CBC Lab Results  Component Value Date   WBC 4.7 05/13/2022   RBC 4.26 05/13/2022   HGB 12.8 05/13/2022   HCT 37.8 05/13/2022   PLT 360 05/13/2022   MCV 88.7 05/13/2022   MCH 30.0 05/13/2022   MCHC 33.9 05/13/2022   RDW 12.0 05/13/2022   LYMPHSABS 1,918 05/13/2022   MONOABS 0.3 04/01/2022   EOSABS 141 05/13/2022    BMET Lab Results  Component Value Date   NA 139 05/13/2022   K 3.7 05/13/2022   CL 100 05/13/2022   CO2 31 05/13/2022   GLUCOSE 131 (H) 05/13/2022   BUN 15 05/13/2022   CREATININE 0.67 05/13/2022   CALCIUM 10.1 05/13/2022   GFRNONAA >60 04/03/2022   GFRAA 96 02/20/2021   Assessment and Plan  Hiv disease= well controlled. Will refill biktarvy.  Long term medication management = cr is stable  IDDM = celebrating her  new chnages and adherence to diet and insulin regimen/adherence; working on her brother.  Health maintenance - did mammo (in may) also had colonoscopy ( June). Doing much better. Aiming for increasing exercise. Utd on vaccines. This fall is flu, new covid booster

## 2022-06-12 ENCOUNTER — Ambulatory Visit: Payer: Medicaid Other | Admitting: Student

## 2022-06-16 DIAGNOSIS — R062 Wheezing: Secondary | ICD-10-CM | POA: Diagnosis not present

## 2022-06-16 DIAGNOSIS — B20 Human immunodeficiency virus [HIV] disease: Secondary | ICD-10-CM | POA: Diagnosis not present

## 2022-06-16 DIAGNOSIS — G4733 Obstructive sleep apnea (adult) (pediatric): Secondary | ICD-10-CM | POA: Diagnosis not present

## 2022-06-16 DIAGNOSIS — R0902 Hypoxemia: Secondary | ICD-10-CM | POA: Diagnosis not present

## 2022-06-24 ENCOUNTER — Encounter: Payer: Self-pay | Admitting: Student

## 2022-06-24 ENCOUNTER — Other Ambulatory Visit: Payer: Self-pay | Admitting: Student

## 2022-06-24 ENCOUNTER — Ambulatory Visit (INDEPENDENT_AMBULATORY_CARE_PROVIDER_SITE_OTHER): Payer: Medicaid Other | Admitting: Student

## 2022-06-24 VITALS — BP 144/89 | HR 69 | Ht 63.0 in | Wt 192.6 lb

## 2022-06-24 DIAGNOSIS — B20 Human immunodeficiency virus [HIV] disease: Secondary | ICD-10-CM | POA: Diagnosis not present

## 2022-06-24 DIAGNOSIS — Z794 Long term (current) use of insulin: Secondary | ICD-10-CM

## 2022-06-24 DIAGNOSIS — E1165 Type 2 diabetes mellitus with hyperglycemia: Secondary | ICD-10-CM

## 2022-06-24 DIAGNOSIS — E1140 Type 2 diabetes mellitus with diabetic neuropathy, unspecified: Secondary | ICD-10-CM | POA: Diagnosis not present

## 2022-06-24 LAB — POCT GLYCOSYLATED HEMOGLOBIN (HGB A1C): HbA1c, POC (controlled diabetic range): 8.6 % — AB (ref 0.0–7.0)

## 2022-06-24 MED ORDER — INSULIN GLARGINE 100 UNIT/ML SOLOSTAR PEN: 10.0000 [IU] | PEN_INJECTOR | SUBCUTANEOUS | 0 refills | Status: DC

## 2022-06-24 MED ORDER — TRULICITY 0.75 MG/0.5ML ~~LOC~~ SOAJ: 0.7500 mg | SUBCUTANEOUS | 3 refills | Status: DC

## 2022-06-24 MED ORDER — INSULIN GLARGINE 100 UNIT/ML SOLOSTAR PEN: 10.0000 [IU] | PEN_INJECTOR | SUBCUTANEOUS | 11 refills | Status: DC

## 2022-06-24 NOTE — Assessment & Plan Note (Signed)
On [employer] with ID

## 2022-06-24 NOTE — Patient Instructions (Addendum)
It was great to see you today! Thank you for choosing [facility] for your primary care. Leslie Gates was seen for follow up.  Today we addressed: Continue with Lantus 10 U daily  Continue with trulicity once weekly  F/u with Dr. Raymondo Band in 1 month   If you haven't already, sign up for My Chart to have easy access to your labs results, and communication with your primary care physician.  You should return to our clinic Return in about 3 months (around 09/24/2022) for DM2.  I recommend that you always bring your medications to each appointment as this makes it easy to ensure you are on the correct medications and helps [place] not miss refills when you need them.  Please arrive 15 minutes before your appointment to ensure smooth check in process.  We appreciate your efforts in making this happen.  Please call the clinic at [phone] if your symptoms worsen or you have any concerns.  Thank you for allowing me to participate in your care, Alfredo Martinez, MD 06/24/2022, 10:49 AM PGY-, [facility]

## 2022-06-24 NOTE — Progress Notes (Signed)
  SUBJECTIVE:   CHIEF COMPLAINT / HPI:   Type 2 Diabetes: Last three A1C's below. Home medications include: Tresiba 14 units daily, patient reports that she has not been on any medication for the last 3 weeks as her Leslie Gates was not covered by her insurance.  Notes CBGs range 170-185. Denies hypoglycemia, polyuria, polydipsia.  Patient does report an improvement in her diet, eats more vegetables and fruits and is exercising more regularly.  She has a good community of people to hold her accountable as well.  Most recent A1Cs:  Lab Results  Component Value Date   HGBA1C 8.6 (A) 06/24/2022   HGBA1C >15.0 03/13/2022   HGBA1C >14.0 (H) 09/29/2021   Last Microalbumin, LDL, Creatinine: Lab Results  Component Value Date   MICROALBUR 30 01/06/2021   LDLCALC 66 03/13/2022   CREATININE 0.67 05/13/2022    Patient is not up to date on diabetic eye. Patient is up to date on diabetic foot exam.   Pap smear: In need of pap smear, has appointment with OB/GYN this month. -07/2019 L SIL, CIN-1, positive HPV - 12/2017 ASCUS, HPV positive - 09/2016 L SIL, CIN-1, positive HPV  Sees ID for HIV and controlled on Biktarvy    PERTINENT  PMH / PSH: HIV, hypertension, OSA, pulmonary hypertension, diastolic heart failure, depression, CAD, anxiety   OBJECTIVE:  BP (!) 144/89   Pulse 69   Ht 5\' 3"  (1.6 m)   Wt 192 lb 9.6 oz (87.4 kg)   LMP 12/05/2021   SpO2 100%   BMI 34.12 kg/m   General: NAD, pleasant, able to participate in exam Cardiac: RRR, no murmurs auscultated Respiratory: CTAB, normal WOB Abdomen: soft, non-tender, non-distended, normoactive bowel sounds Skin: warm and dry, no rashes noted Neuro: alert, no obvious focal deficits, speech normal Psych: Normal affect and mood  ASSESSMENT/PLAN:  HIV disease (HCC) On Biktarvy  Follows with ID   Type 2 diabetes mellitus with diabetic neuropathy, unspecified (HCC) A1c more controlled today, >15 to 8.6 today with better control  Continue  with Lantus 10 U, covered by insurance and D/c tresiba  Continue with trulicity weekly, no h/o cancer or pancreatitis  Monitor for hypoglycemia symptoms  F/u with Dr. 12/07/2021 for medication management in 1 month  Continue with diet and exercise  Continue Statin  Eye appt needed    Orders Placed This Encounter  Procedures   HgB A1c   Meds ordered this encounter  Medications   Dulaglutide (TRULICITY) 0.75 MG/0.5ML SOPN    Sig: Inject 0.75 mg into the skin once a week.    Dispense:  0.5 mL    Refill:  3   insulin glargine (LANTUS) 100 UNIT/ML Solostar Pen    Sig: Inject 10 Units into the skin every morning.    Dispense:  3 mL    Refill:  0    Order Specific Question:   Lot Number?    Answer:   [id number]    Order Specific Question:   Expiration Date?    Answer:   11/16/2023   insulin glargine (LANTUS) 100 UNIT/ML Solostar Pen    Sig: Inject 10 Units into the skin every morning.    Dispense:  3 mL    Refill:  11   Return in about 3 months (around 09/24/2022) for DM2. 13/07/2022, MD 06/24/2022, 12:06 PM PGY-2, [facility]

## 2022-06-24 NOTE — Assessment & Plan Note (Signed)
A1c more controlled today, >15 to 8.6 today with better control  Continue with Lantus 10 U, covered by insurance and D/c tresiba  Continue with trulicity weekly, no h/o cancer or pancreatitis  Monitor for hypoglycemia symptoms  F/u with Dr. Raymondo Band for medication management in 1 month  Continue with diet and exercise  Continue Statin  Eye appt needed

## 2022-06-25 ENCOUNTER — Other Ambulatory Visit: Payer: Self-pay | Admitting: Student

## 2022-06-25 DIAGNOSIS — E1165 Type 2 diabetes mellitus with hyperglycemia: Secondary | ICD-10-CM

## 2022-06-25 MED ORDER — TRULICITY 0.75 MG/0.5ML ~~LOC~~ SOAJ: 0.7500 mg | SUBCUTANEOUS | 3 refills | Status: DC

## 2022-06-25 MED ORDER — INSULIN GLARGINE 100 UNIT/ML SOLOSTAR PEN: 10.0000 [IU] | PEN_INJECTOR | SUBCUTANEOUS | 11 refills | Status: DC

## 2022-06-25 NOTE — Telephone Encounter (Signed)
Tried calling the pt to verify pharmacy, however pt did not answer. Will try again later.

## 2022-06-25 NOTE — Telephone Encounter (Signed)
Please confirm which pharmacy to send to --Dr. Jena Gauss sent to different pharmacy yesterday.  Terisa Starr, MD  Family Medicine Teaching Service

## 2022-06-25 NOTE — Telephone Encounter (Signed)
Tried calling the pt to verify pharmacy, however pt did not answer will try again later.

## 2022-07-09 ENCOUNTER — Encounter: Payer: Self-pay | Admitting: Obstetrics and Gynecology

## 2022-07-09 ENCOUNTER — Ambulatory Visit (INDEPENDENT_AMBULATORY_CARE_PROVIDER_SITE_OTHER): Payer: Medicaid Other | Admitting: Obstetrics and Gynecology

## 2022-07-09 VITALS — BP 112/75 | HR 69 | Ht 63.0 in | Wt 193.0 lb

## 2022-07-09 DIAGNOSIS — Z01419 Encounter for gynecological examination (general) (routine) without abnormal findings: Secondary | ICD-10-CM | POA: Diagnosis not present

## 2022-07-09 NOTE — Progress Notes (Addendum)
GYNECOLOGY ANNUAL PREVENTATIVE CARE ENCOUNTER NOTE  History:     Leslie Gates is a 56 y.o. G63P0 female here for a routine annual gynecologic exam.  Current complaints: none.  She does note amenorrhea x 4-5 months with intermittent hot flashes.   Denies abnormal vaginal bleeding, discharge, pelvic pain, problems with intercourse or other gynecologic concerns.    Gynecologic History Patient's last menstrual period was 12/05/2021. Contraception: none Last Pap: 12/21. Results were: normal with negative HPV, pt due for pap in 1 year Last mammogram: 03/26/22. Results were: normal  Obstetric History OB History  Gravida Para Term Preterm AB Living  3         2  SAB IAB Ectopic Multiple Live Births          3    # Outcome Date GA Lbr Len/2nd Weight Sex Delivery Anes PTL Lv  3 [place]           2 Gravida           1 [place]             Past Medical History:  Diagnosis Date   Allergic rhinitis    Allergy    Anemia    Anxiety    Arthritis    Asthma    Chronic otitis media    Coronary artery disease    Depression    Diastolic CHF, chronic (HCC)    Dysphagia    Genital warts 11/13/2020   GERD (gastroesophageal reflux disease)    History of cervical dysplasia    History of herpes genitalis 11/13/2020   History of herpes zoster    HTN (hypertension)    Human immunodeficiency virus (HIV) ([place]) 2001   Hyperlipidemia    Obesity    OSA (obstructive sleep apnea)    Oxygen deficiency    Pulmonary HTN ([place])    Sleep apnea    Wrist fracture, bilateral     Past Surgical History:  Procedure Laterality Date   ADENOIDECTOMY     CESAREAN SECTION     NASAL SINUS SURGERY  2004   [facility]   RIGHT HEART CATHETERIZATION N/A 02/01/2012   Procedure: RIGHT HEART CATH;  Surgeon: Jolaine Artist, MD;  Location: [facility] CATH LAB;  Service: Cardiovascular;  Laterality: N/A;   TYMPANOSTOMY TUBE PLACEMENT     as a child   WRIST FRACTURE SURGERY  2006   bilateral - [place]    Current  Outpatient Medications on File Prior to Visit  Medication Sig Dispense Refill   amLODipine (NORVASC) 10 MG tablet TAKE ONE TABLET BY MOUTH AT NIGHT. - NEEDS APPT FOR FURTURE REFILLS- (Patient taking differently: Take 10 mg by mouth daily.) 90 tablet 3   atorvastatin (LIPITOR) 40 MG tablet TAKE ONE TABLET BY MOUTH DAILY - NEEDS APPT FOR FUTURE REFILLS- 90 tablet 3   bictegravir-emtricitabine-tenofovir AF (BIKTARVY) 50-200-25 MG TABS tablet Take 1 tablet by mouth daily. 30 tablet 11   Budeson-Glycopyrrol-Formoterol (BREZTRI AEROSPHERE) 160-9-4.8 MCG/ACT AERO Inhale 160 mcg into the lungs 2 (two) times daily. 5.9 g 0   Dulaglutide (TRULICITY) 8.41 YS/0.6TK SOPN Inject 0.75 mg into the skin once a week. 2 mL 3   furosemide (LASIX) 20 MG tablet TAKE ONE TABLET BY MOUTH EVERY DAY. MAY TAKE EXTRA TABLET EVERY DAY AS NEEDED FOR WEIGHT GAIN 3 LBS OR MORE OVERNIGHT 30 tablet 1   insulin glargine (LANTUS) 100 UNIT/ML Solostar Pen Inject 10 Units into the skin every morning. 3 mL 0  Multiple Vitamin (TAB-A-VITE) TABS TAKE ONE TABLET BY MOUTH EVERY DAY 30 tablet 11   NAPHCON-A 0.025-0.3 % ophthalmic solution INSTILL 1 DROP INTO BOTH EYES FOUR TIMES A DAY AS NEEDED FOR IRRITATION (Patient taking differently: Place 1 drop into both eyes 4 (four) times daily as needed for eye irritation.) 15 mL 0   Olopatadine HCl (PATADAY) 0.2 % SOLN Apply 1 drop to eye daily as needed. (Patient taking differently: Apply 1 drop to eye daily as needed (For dry eyes).) 2.5 mL 3   omeprazole (PRILOSEC) 40 MG capsule TAKE ONE CAPSULE BY MOUTH EVERY DAY (Patient taking differently: Take 40 mg by mouth daily.) 90 capsule 1   PROAIR HFA 108 (90 Base) MCG/ACT inhaler INHALE 1 OR 2 PUFFS INTO THE LUNGS EVERY 6 HOURS AS NEEDED FOR WHEEZING OR SHORTNESS OF BREATH (Patient taking differently: Inhale 1 puff into the lungs every 6 (six) hours as needed for shortness of breath.) 8.5 g 0   RESTASIS 0.05 % ophthalmic emulsion Place 1 drop into both  eyes 2 (two) times daily.     SYMBICORT 160-4.5 MCG/ACT inhaler INHALE 2 PUFFS INTO THE LUNGS TWO TIMES DAILY (Patient taking differently: Inhale 2 puffs into the lungs daily.) 10.2 g 0   ACCU-CHEK GUIDE test strip USE TO TEST BLOOD SUGAR FOUR TIMES DAILY AS DIRECTED 100 strip 5   Accu-Chek Softclix Lancets lancets USE TO TEST BLOOD SUGAR FOUR TIMES DAILY AS DIRECTED 100 each 5   blood glucose meter kit and supplies KIT Dispense based on patient and insurance preference. Use up to four times daily as directed. 1 each 0   insulin glargine (LANTUS) 100 UNIT/ML Solostar Pen Inject 10 Units into the skin every morning. 15 mL 11   Insulin Pen Needle 31G X 5 MM MISC 1 each by Does not apply route daily. 100 each 3   neomycin-polymyxin b-dexamethasone (MAXITROL) 3.5-10000-0.1 OINT SMARTSIG:1 Inch(es) In Eye(s) Every Night (Patient not taking: Reported on 07/09/2022)     OXYGEN Inhale 2 L into the lungs at bedtime.     spironolactone (ALDACTONE) 25 MG tablet TAKE ONE-HALF TABLET BY MOUTH NIGHTLY AT BEDTIME 90 tablet 5   No current facility-administered medications on file prior to visit.    Allergies  Allergen Reactions   Lisinopril Swelling    Very swollen bottom lip    Social History:  reports that she has never smoked. She has never used smokeless tobacco. She reports that she does not drink alcohol and does not use drugs.  Family History  Problem Relation Age of Onset   Other Father        GSW   Hypertension Father    Kidney disease Father        was on dialysis prior to death   Alcohol abuse Father        liver failure from alcohol use   Other Mother 54       ESRD   Hyperlipidemia Mother    Hypertension Mother    Alcohol abuse Mother    Hypertension Other    Hypertension Sister    Alcohol abuse Sister    Hypertension Brother    Alcohol abuse Brother     The following portions of the patient's history were reviewed and updated as appropriate: allergies, current medications, past  family history, past medical history, past social history, past surgical history and problem list.  Review of Systems Pertinent items noted in HPI and remainder of comprehensive ROS otherwise negative.  Physical  Exam:  BP 112/75   Pulse 69   Ht _0  (1.6 m)   Wt 193 lb (87.5 kg)   LMP 12/05/2021   BMI 34.19 kg/m  CONSTITUTIONAL: Well-developed, well-nourished female in no acute distress.  HENT:  Normocephalic, atraumatic, External right and left ear normal. Oropharynx is clear and moist EYES: Conjunctivae and EOM are normal.  NECK: Normal range of motion, supple, no masses.  Normal thyroid.  SKIN: Skin is warm and dry. No rash noted. Not diaphoretic. No erythema. No pallor. MUSCULOSKELETAL: Normal range of motion. No tenderness.  No cyanosis, clubbing, or edema.  2+ distal pulses. NEUROLOGIC: Alert and oriented to person, place, and time. Normal reflexes, muscle tone coordination.  PSYCHIATRIC: Normal mood and affect. Normal behavior. Normal judgment and thought content. CARDIOVASCULAR: Normal heart rate noted, regular rhythm RESPIRATORY: Clear to auscultation bilaterally. Effort and breath sounds normal, no problems with respiration noted. BREASTS: Symmetric in size. No masses, tenderness, skin changes, nipple drainage, or lymphadenopathy bilaterally. Performed in the presence of a chaperone. ABDOMEN: Soft, no distention noted.  No tenderness, rebound or guarding.  PELVIC: Normal appearing external genitalia and urethral meatus; normal appearing vaginal mucosa and cervix.  No abnormal discharge noted.    Normal uterine size, no other palpable masses, no uterine or adnexal tenderness.  Performed in the presence of a chaperone.   Assessment and Plan:    Women's Health annual exam:  Normal annual exam Pt may be transitioning into menopause due to persistent amenorrhea as well as vasomotor symptoms. Briefly reviewed pt's other labs.  Blood sugars were elevated in May, but A1c is greatly  improved from previous.  She states she follows up monthly with her provider Pap in 1 year Mammogram done for the year Routine preventative health maintenance measures emphasized. Please refer to After Visit Summary for other counseling recommendations.      Lynnda Shields, MD, [place] [facility], [place]

## 2022-07-15 DIAGNOSIS — G4733 Obstructive sleep apnea (adult) (pediatric): Secondary | ICD-10-CM | POA: Diagnosis not present

## 2022-07-16 ENCOUNTER — Other Ambulatory Visit: Payer: Self-pay

## 2022-07-16 DIAGNOSIS — I5032 Chronic diastolic (congestive) heart failure: Secondary | ICD-10-CM

## 2022-07-17 DIAGNOSIS — R062 Wheezing: Secondary | ICD-10-CM | POA: Diagnosis not present

## 2022-07-17 DIAGNOSIS — R0902 Hypoxemia: Secondary | ICD-10-CM | POA: Diagnosis not present

## 2022-07-17 DIAGNOSIS — B20 Human immunodeficiency virus [HIV] disease: Secondary | ICD-10-CM | POA: Diagnosis not present

## 2022-07-17 DIAGNOSIS — G4733 Obstructive sleep apnea (adult) (pediatric): Secondary | ICD-10-CM | POA: Diagnosis not present

## 2022-07-17 MED ORDER — FUROSEMIDE 20 MG PO TABS: ORAL_TABLET | ORAL | 1 refills | Status: DC

## 2022-07-27 ENCOUNTER — Ambulatory Visit: Payer: Medicaid Other | Admitting: Pharmacist

## 2022-07-30 ENCOUNTER — Encounter: Payer: Self-pay | Admitting: Pharmacist

## 2022-07-30 ENCOUNTER — Ambulatory Visit (INDEPENDENT_AMBULATORY_CARE_PROVIDER_SITE_OTHER): Payer: Medicaid Other | Admitting: Pharmacist

## 2022-07-30 DIAGNOSIS — Z794 Long term (current) use of insulin: Secondary | ICD-10-CM

## 2022-07-30 DIAGNOSIS — E1165 Type 2 diabetes mellitus with hyperglycemia: Secondary | ICD-10-CM | POA: Diagnosis not present

## 2022-07-30 MED ORDER — TRULICITY 1.5 MG/0.5ML ~~LOC~~ SOAJ: 1.5000 mg | SUBCUTANEOUS | 3 refills | Status: DC

## 2022-07-30 NOTE — Progress Notes (Signed)
Reviewed: I agree with Dr. Koval's documentation and management. 

## 2022-07-30 NOTE — Patient Instructions (Signed)
It was nice to see you today!  Your goal blood sugar is 80-130 before eating and less than 180 after eating.  Medication Changes: -Discontinue basal insulin Lantus (insulin glargine). -Increase dose of GLP-1 Trulicity (generic dulaglutide) to 1.5 mg weekly from 0.75 mg weekly.   Monitor blood sugars at home and keep a log (glucometer or piece of paper) to bring with you to your next visit.  Keep up the good work with diet and exercise. Aim for a diet full of vegetables, fruit and lean meats (chicken, [place], fish). Try to limit salt intake by eating fresh or frozen vegetables (instead of canned), rinse canned vegetables prior to cooking and do not add any additional salt to meals.

## 2022-07-30 NOTE — Progress Notes (Signed)
S:     Chief Complaint  Patient presents with   Medication Management    Diabetes management   Leslie Gates is a 56 y.o. female who presents for diabetes evaluation, education, and management.  PMH is significant for HTN, angioedema, hyperlipidemia.  Patient was referred and last seen by Primary Care Provider, Dr. Jena Gauss, on 06/24/2022.   Today, patient arrives in good spirits and presents without any assistance.   Current diabetes medications include: Lantus (insulin glargine) 10 units in the morning, was taking twice a day, but she stopped. Trulicity (dulaglutide) 0.75 mg injection weekly Current hypertension medications include: amlodipine 10 mg daily, spironolactone 12.5 mg daily Current hyperlipidemia medications include: atorvastatin 40 mg daily  Patient reports adherence to taking all medications as prescribed. Was taking Lantus twice a day instead of once daily for while and got sick from that, patient has since corrected this and is now taking once daily again.   Do you feel that your medications are working for you? yes Have you been experiencing any side effects to the medications prescribed? no Insurance coverage: [place] Medicaid  Patient denies hypoglycemic events. Reports has not seen numbers under 100.   Reported home fasting blood sugars: 160s-180s Reported 2 hour post-meal/random blood sugars: none over 200.   Patient reported dietary habits: Reports making significant dietary changes. Avoids eating fried foods, baked foods. Has started to meal prep, eat more vegetables. Has started her own garden. Drinks: water mostly, grape juice  O:   Review of Systems  Gastrointestinal:  Negative for abdominal pain, constipation and diarrhea.  All other systems reviewed and are negative.   Physical Exam Constitutional:      Appearance: Normal appearance.  Neurological:     Mental Status: She is alert.  Psychiatric:        Mood and Affect: Mood normal.         Behavior: Behavior normal.        Thought Content: Thought content normal.      Lab Results  Component Value Date   HGBA1C 8.6 (A) 06/24/2022    Lipid Panel     Component Value Date/Time   CHOL 156 03/13/2022 1321   TRIG 121 03/13/2022 1321   HDL 69 03/13/2022 1321   CHOLHDL 2.3 03/13/2022 1321   CHOLHDL 2.8 02/20/2021 1430   VLDL 28 03/30/2017 1555   LDLCALC 66 03/13/2022 1321   LDLCALC 90 02/20/2021 1430   LDLDIRECT 144 (H) 01/06/2021 1514    A/P: Diabetes longstanding currently close to goal based on rapid improvement of A1c (8.6). Patient is able to verbalize appropriate hypoglycemia management plan. Medication adherence appears good Control has improved due to lifestyle interventions like starting to exercise and eating healthier. -Discontinued basal insulin Lantus (insulin glargine). -Increased dose of GLP-1 Trulicity (generic dulaglutide) to 1.5 mg weekly from 0.75 mg weekly. Patient has 2 pens left of the 0.75 mg dose, she will inject both this week in order to start new 1.5 mg dose. -Patient educated on purpose, proper use, and potential adverse effects of increasing dose of Trulicity.  -Extensively discussed pathophysiology of diabetes, recommended lifestyle interventions, dietary effects on blood sugar control.  -Counseled on s/sx of and management of hypoglycemia.    Written patient instructions provided. Patient verbalized understanding of treatment plan.  Total time in face to face counseling 30 minutes.    Follow-up:  Pharmacist Dr. Raymondo Band, not scheduled. PCP clinic visit with Dr. Claudean Severance on 08/10/2022.  Patient seen with Minerva Areola  Tonia Brooms, PharmD Candidate and Valeda Malm, PharmD, PGY2 Pharmacy Resident.  Marland Kitchen

## 2022-07-30 NOTE — Assessment & Plan Note (Signed)
Diabetes longstanding currently close to goal based on rapid improvement of A1c (8.6). Patient is able to verbalize appropriate hypoglycemia management plan. Medication adherence appears good Control has improved due to lifestyle interventions like starting to exercise and eating healthier. -Discontinued basal insulin Lantus (insulin glargine). -Increased dose of GLP-1 Trulicity (generic dulaglutide) to 1.5 mg weekly from 0.75 mg weekly. Patient has 2 pens left of the 0.75 mg dose, she will inject both this week in order to start new 1.5 mg dose. -Patient educated on purpose, proper use, and potential adverse effects of increasing dose of Trulicity.  -Extensively discussed pathophysiology of diabetes, recommended lifestyle interventions, dietary effects on blood sugar control.

## 2022-08-07 ENCOUNTER — Other Ambulatory Visit: Payer: Self-pay

## 2022-08-09 NOTE — Progress Notes (Signed)
    SUBJECTIVE:   CHIEF COMPLAINT / HPI:   Diabetic Follow Up: Patient is a 56 y.o. female who present today for diabetic follow up and health maintenance. Needs refill of zyrtec for allergies.  Patient endorses no problems  Home medications include: Trulicity 1.5 mg weekly injection ACEi/ARB: no Statin: yes Patient endorses taking these medications as prescribed.  Most recent A1Cs:  Lab Results  Component Value Date   HGBA1C 8.6 (A) 06/24/2022   HGBA1C >15.0 03/13/2022   HGBA1C >14.0 (H) 09/29/2021   Last Microalbumin, LDL, Creatinine: Lab Results  Component Value Date   MICROALBUR 30 01/06/2021   LDLCALC 66 03/13/2022   CREATININE 0.67 05/13/2022   Patient does check blood glucose on a regular basis.  Patient is not up to date on diabetic eye. Patient is up to date on diabetic foot exam.  PERTINENT  PMH / PSH: Asthma, heart failure, hypertension  OBJECTIVE:   BP 134/86   Pulse 70   Wt 202 lb 3.2 oz (91.7 kg)   LMP 12/05/2021   SpO2 96%   BMI 38.21 kg/m    HEENT: Normocephalic atraumatic head.  External ear, canal, TM normal bilaterally.  EOM intact bilaterally.  Nose normal.  Throat is without exudate or erythema Cardio: Regular rate and rhythm, no MRG Pulmonology: CTAB, regular work of breathing on room air GI: Bowel sounds present, no tenderness to palpation  ASSESSMENT/PLAN:   Seasonal allergies Well-controlled with Zyrtec.  Patient needs refill. - Refill Zyrtec  Type 2 diabetes mellitus with hyperglycemia (HCC) A1c at goal.  Compliant and without side effects with Trulicity.  No acute concerns today.  She has appointment for eye doctor for diabetic eye exam. - Continue current regimen - Follow-up in 3 months for lab work  Healthcare maintenance - Up-to-date on Pap (06/2022) Marked in chart. - NOT up-to-date on Zoster, flu, COVID vaccination. Counseling provided. - Has appointment for diabetic eye exam  Leslie Dales, [place]

## 2022-08-10 ENCOUNTER — Ambulatory Visit (INDEPENDENT_AMBULATORY_CARE_PROVIDER_SITE_OTHER): Payer: Medicaid Other | Admitting: Student

## 2022-08-10 ENCOUNTER — Other Ambulatory Visit: Payer: Self-pay

## 2022-08-10 ENCOUNTER — Encounter: Payer: Self-pay | Admitting: Student

## 2022-08-10 VITALS — BP 134/86 | HR 70 | Wt 202.2 lb

## 2022-08-10 DIAGNOSIS — J302 Other seasonal allergic rhinitis: Secondary | ICD-10-CM | POA: Diagnosis not present

## 2022-08-10 DIAGNOSIS — Z794 Long term (current) use of insulin: Secondary | ICD-10-CM | POA: Diagnosis not present

## 2022-08-10 DIAGNOSIS — E1165 Type 2 diabetes mellitus with hyperglycemia: Secondary | ICD-10-CM

## 2022-08-10 MED ORDER — CETIRIZINE HCL 10 MG PO TABS: 10.0000 mg | ORAL_TABLET | Freq: Every day | ORAL | 1 refills | Status: DC

## 2022-08-10 NOTE — Assessment & Plan Note (Signed)
Well-controlled with Zyrtec.  Patient needs refill. - Refill Zyrtec

## 2022-08-10 NOTE — Assessment & Plan Note (Signed)
A1c at goal.  Compliant and without side effects with Trulicity.  No acute concerns today.  She has appointment for eye doctor for diabetic eye exam. - Continue current regimen - Follow-up in 3 months for lab work 

## 2022-08-10 NOTE — Assessment & Plan Note (Deleted)
A1c at goal.  Compliant and without side effects with Trulicity.  No acute concerns today.  She has appointment for eye doctor for diabetic eye exam. - Continue current regimen - Follow-up in 3 months for lab work

## 2022-08-10 NOTE — Patient Instructions (Signed)
It was great to see you! Thank you for allowing me to participate in your care!   I recommend that you always bring your medications to each appointment as this makes it easy to ensure we are on the correct medications and helps [place] not miss when refills are needed.  Our plans for today:  - Follow up in 3 months for lab work - Consider getting your shingles, flu, and Covid vaccination - Go to your eye doctor for a diabetic eye exam   Take care and seek immediate care sooner if you develop any concerns. Please remember to show up 15 minutes before your scheduled appointment time!  Leslie Dales, DO [facility]

## 2022-08-12 NOTE — Progress Notes (Deleted)
Patient ID: Leslie Gates, female   DOB: 1966/06/09, 56 y.o.   MRN: [id number]  HPI   Outpatient Encounter Medications as of 05/26/2022  Medication Sig   ACCU-CHEK GUIDE test strip USE TO TEST BLOOD SUGAR FOUR TIMES DAILY AS DIRECTED   Accu-Chek Softclix Lancets lancets USE TO TEST BLOOD SUGAR FOUR TIMES DAILY AS DIRECTED   amLODipine (NORVASC) 10 MG tablet TAKE ONE TABLET BY MOUTH AT NIGHT. - NEEDS APPT FOR FURTURE REFILLS- (Patient taking differently: Take 10 mg by mouth daily.)   atorvastatin (LIPITOR) 40 MG tablet TAKE ONE TABLET BY MOUTH DAILY - NEEDS APPT FOR FUTURE REFILLS-   blood glucose meter kit and supplies KIT Dispense based on patient and insurance preference. Use up to four times daily as directed.   Budeson-Glycopyrrol-Formoterol (BREZTRI AEROSPHERE) 160-9-4.8 MCG/ACT AERO Inhale 160 mcg into the lungs 2 (two) times daily.   Insulin Pen Needle 31G X 5 MM MISC 1 each by Does not apply route daily. (Patient not taking: Reported on 08/10/2022)   Multiple Vitamin (TAB-A-VITE) TABS TAKE ONE TABLET BY MOUTH EVERY DAY   NAPHCON-A 0.025-0.3 % ophthalmic solution INSTILL 1 DROP INTO BOTH EYES FOUR TIMES A DAY AS NEEDED FOR IRRITATION (Patient taking differently: Place 1 drop into both eyes 4 (four) times daily as needed for eye irritation.)   Olopatadine HCl (PATADAY) 0.2 % SOLN Apply 1 drop to eye daily as needed. (Patient taking differently: Apply 1 drop to eye daily as needed (For dry eyes).)   omeprazole (PRILOSEC) 40 MG capsule TAKE ONE CAPSULE BY MOUTH EVERY DAY (Patient taking differently: Take 40 mg by mouth daily as needed (GERD).)   OXYGEN Inhale 2 L into the lungs at bedtime.   PROAIR HFA 108 (90 Base) MCG/ACT inhaler INHALE 1 OR 2 PUFFS INTO THE LUNGS EVERY 6 HOURS AS NEEDED FOR WHEEZING OR SHORTNESS OF BREATH (Patient taking differently: Inhale 1 puff into the lungs every 6 (six) hours as needed for shortness of breath.)   RESTASIS 0.05 % ophthalmic emulsion Place 1  drop into both eyes 2 (two) times daily.   spironolactone (ALDACTONE) 25 MG tablet TAKE ONE-HALF TABLET BY MOUTH NIGHTLY AT BEDTIME   SYMBICORT 160-4.5 MCG/ACT inhaler INHALE 2 PUFFS INTO THE LUNGS TWO TIMES DAILY (Patient taking differently: Inhale 2 puffs into the lungs daily.)   [DISCONTINUED] BIKTARVY 50-200-25 MG TABS tablet TAKE ONE TABLET BY MOUTH EVERY DAY ***this replaces genvoya*** (Patient taking differently: Take 1 tablet by mouth daily.)   [DISCONTINUED] fexofenadine (ALLEGRA) 180 MG tablet Take 1 tablet (180 mg total) by mouth daily.   [DISCONTINUED] furosemide (LASIX) 20 MG tablet TAKE ONE TABLET BY MOUTH EVERY DAY. MAY TAKE EXTRA TABLET EVERY DAY AS NEEDED FOR WEIGHT GAIN 3 LBS OR MORE OVERNIGHT   [DISCONTINUED] metFORMIN (GLUCOPHAGE-XR) 500 MG 24 hr tablet Take 2 tablets (1,000 mg total) by mouth in the morning and at bedtime.   [DISCONTINUED] neomycin-polymyxin b-dexamethasone (MAXITROL) 3.5-10000-0.1 OINT  (Patient not taking: Reported on 07/30/2022)   [DISCONTINUED] TRESIBA FLEXTOUCH 100 UNIT/ML FlexTouch Pen INJECT 10 UNITS UNDER THE SKIN EVERY DAY (Patient taking differently: Inject 10 Units into the skin daily.)   bictegravir-emtricitabine-tenofovir AF (BIKTARVY) 50-200-25 MG TABS tablet Take 1 tablet by mouth daily.   [DISCONTINUED] spironolactone (ALDACTONE) 25 MG tablet TAKE ONE-HALF TABLET BY MOUTH NIGHTLY AT BEDTIME (Patient taking differently: Take 12.5 mg by mouth daily.)   No facility-administered encounter medications on file as of 05/26/2022.     Patient Active Problem  List   Diagnosis Date Noted   Seasonal allergies 08/10/2022   Healthcare maintenance 04/10/2022   Type 2 diabetes mellitus with diabetic neuropathy, unspecified ([place]) 03/13/2022   Dysplasia of cervix, low grade (CIN 1) 11/13/2020   Type 2 diabetes mellitus with hyperglycemia ([place]) 12/15/2019   Hyperlipidemia associated with type 2 diabetes mellitus ([place]) 04/29/2018   Chronic diastolic heart failure  ([place]) 06/25/2017   Moderate dysplasia of cervix 07/10/2013   History of angioedema 09/09/2011   Obesity hypoventilation syndrome ([place]) 07/10/2010   Obesity, Class I, BMI 30-34.9 07/01/2010   Obstructive sleep apnea 06/27/2010   Pulmonary hypertension ([place]) 05/28/2010   Hypertension associated with type 2 diabetes mellitus ([place]) 03/22/2007   HIV disease ([place]) 06/21/2002     Health Maintenance Due  Topic Date Due   Zoster Vaccines- Shingrix (1 of 2) Never done   COVID-19 Vaccine (3 - Pfizer risk series) 04/24/2020   OPHTHALMOLOGY EXAM  12/20/2021   INFLUENZA VACCINE  06/16/2022     Review of Systems  Physical Exam   BP 114/74   Pulse 84   Temp 98.6 F (37 C) (Oral)   Wt 181 lb (82.1 kg)   LMP 12/05/2021   BMI 32.06 kg/m    Lab Results  Component Value Date   CD4TCELL 37 05/13/2022   Lab Results  Component Value Date   CD4TABS 642 05/13/2022   CD4TABS 468 09/29/2021   CD4TABS 747 02/20/2021   Lab Results  Component Value Date   HIV1RNAQUANT Not Detected 05/13/2022   Lab Results  Component Value Date   HEPBSAB NEG 02/08/2009   Lab Results  Component Value Date   LABRPR NON-REACTIVE 05/13/2022    CBC Lab Results  Component Value Date   WBC 4.7 05/13/2022   RBC 4.26 05/13/2022   HGB 12.8 05/13/2022   HCT 37.8 05/13/2022   PLT 360 05/13/2022   MCV 88.7 05/13/2022   MCH 30.0 05/13/2022   MCHC 33.9 05/13/2022   RDW 12.0 05/13/2022   LYMPHSABS 1,918 05/13/2022   MONOABS 0.3 04/01/2022   EOSABS 141 05/13/2022    BMET Lab Results  Component Value Date   NA 139 05/13/2022   K 3.7 05/13/2022   CL 100 05/13/2022   CO2 31 05/13/2022   GLUCOSE 131 (H) 05/13/2022   BUN 15 05/13/2022   CREATININE 0.67 05/13/2022   CALCIUM 10.1 05/13/2022   GFRNONAA >60 04/03/2022   GFRAA 96 02/20/2021      Assessment and Plan

## 2022-08-12 NOTE — Progress Notes (Signed)
RFV: follow up for hiv disease   Patient ID: Leslie Gates, female   DOB: 25-Dec-1965, 56 y.o.   MRN: [id number]   HPI 56yo F with well controlled hiv disease, CD 4 count of 642/VL<20 in June 2023 on biktarvy.    Has much improved diabetes.had BS in 600s. Now on insulin including triseba. And doming much better. Had also colonsocopy was good. Now predominantly vegetarian. Went to [employer] class to cultivate (June 22)   Almost lost 30 lb since comparing to visit from last Fall.       Outpatient Encounter Medications as of 05/26/2022  Medication Sig   ACCU-CHEK GUIDE test strip USE TO TEST BLOOD SUGAR FOUR TIMES DAILY AS DIRECTED   Accu-Chek Softclix Lancets lancets USE TO TEST BLOOD SUGAR FOUR TIMES DAILY AS DIRECTED   amLODipine (NORVASC) 10 MG tablet TAKE ONE TABLET BY MOUTH AT NIGHT. - NEEDS APPT FOR FURTURE REFILLS- (Patient taking differently: Take 10 mg by mouth daily.)   atorvastatin (LIPITOR) 40 MG tablet TAKE ONE TABLET BY MOUTH DAILY - NEEDS APPT FOR FUTURE REFILLS-   BIKTARVY 50-200-25 MG TABS tablet TAKE ONE TABLET BY MOUTH EVERY DAY   blood glucose meter kit and supplies KIT Dispense based on patient and insurance preference. Use up to four times daily as directed.   Budeson-Glycopyrrol-Formoterol (BREZTRI AEROSPHERE) 160-9-4.8 MCG/ACT AERO Inhale 160 mcg into the lungs 2 (two) times daily.   fexofenadine (ALLEGRA) 180 MG tablet Take 1 tablet (180 mg total) by mouth daily.   furosemide (LASIX) 20 MG tablet TAKE ONE TABLET BY MOUTH EVERY DAY. MAY TAKE EXTRA TABLET EVERY DAY AS NEEDED FOR WEIGHT GAIN 3 LBS OR MORE OVERNIGHT   Insulin Pen Needle 31G X 5 MM MISC 1 each by Does not apply route daily.   metFORMIN (GLUCOPHAGE-XR) 500 MG 24 hr tablet Take 2 tablets (1,000 mg total) by mouth in the morning and at bedtime.   Multiple Vitamin (TAB-A-VITE) TABS TAKE ONE TABLET BY MOUTH EVERY DAY   NAPHCON-A 0.025-0.3 % ophthalmic solution INSTILL 1 DROP INTO BOTH EYES FOUR TIMES A DAY AS  NEEDED FOR IRRITATION (Patient taking differently: Place 1 drop into both eyes 4 (four) times daily as needed for eye irritation.)   neomycin-polymyxin b-dexamethasone (MAXITROL) 3.5-10000-0.1 OINT SMARTSIG:1 Inch(es) In Eye(s) Every Night   Olopatadine HCl (PATADAY) 0.2 % SOLN Apply 1 drop to eye daily as needed. (Patient taking differently: Apply 1 drop to eye daily as needed (For dry eyes).)   omeprazole (PRILOSEC) 40 MG capsule TAKE ONE CAPSULE BY MOUTH EVERY DAY (Patient taking differently: Take 40 mg by mouth daily.)   OXYGEN Inhale 2 L into the lungs at bedtime.   PROAIR HFA 108 (90 Base) MCG/ACT inhaler INHALE 1 OR 2 PUFFS INTO THE LUNGS EVERY 6 HOURS AS NEEDED FOR WHEEZING OR SHORTNESS OF BREATH (Patient taking differently: Inhale 1 puff into the lungs every 6 (six) hours as needed for shortness of breath.)   RESTASIS 0.05 % ophthalmic emulsion Place 1 drop into both eyes 2 (two) times daily.   spironolactone (ALDACTONE) 25 MG tablet TAKE ONE-HALF TABLET BY MOUTH NIGHTLY AT BEDTIME   SYMBICORT 160-4.5 MCG/ACT inhaler INHALE 2 PUFFS INTO THE LUNGS TWO TIMES DAILY (Patient taking differently: Inhale 2 puffs into the lungs daily.)   TRESIBA FLEXTOUCH 100 UNIT/ML FlexTouch Pen INJECT 10 UNITS UNDER THE SKIN EVERY DAY (Patient taking differently: Inject 10 Units into the skin daily.)   [DISCONTINUED] spironolactone (ALDACTONE) 25 MG tablet TAKE ONE-HALF  TABLET BY MOUTH NIGHTLY AT BEDTIME (Patient taking differently: Take 12.5 mg by mouth daily.)    No facility-administered encounter medications on file as of 05/26/2022.          Patient Active Problem List    Diagnosis Date Noted   Healthcare maintenance 04/10/2022   Type 2 diabetes mellitus with diabetic neuropathy, unspecified (HCC) 03/13/2022   Dysplasia of cervix, low grade (CIN 1) 11/13/2020   Type 2 diabetes mellitus with hyperglycemia (HCC) 12/15/2019   Hyperlipidemia associated with type 2 diabetes mellitus (HCC) 04/29/2018    Chronic diastolic heart failure (HCC) 06/25/2017   Moderate dysplasia of cervix 07/10/2013   History of angioedema 09/09/2011   Obesity hypoventilation syndrome (HCC) 07/10/2010   Obesity, Class I, BMI 30-34.9 07/01/2010   Obstructive sleep apnea 06/27/2010   Pulmonary hypertension (HCC) 05/28/2010   Hypertension associated with type 2 diabetes mellitus (HCC) 03/22/2007   HIV disease (HCC) 06/21/2002            Health Maintenance Due  Topic Date Due   PAP SMEAR-Modifier  11/13/2021   OPHTHALMOLOGY EXAM  12/20/2021      Review of Systems 12 point ros is negative except what is mentioned above Physical Exam    BP 114/74   Pulse 84   Temp 98.6 F (37 C) (Oral)   Wt 181 lb (82.1 kg)   LMP 12/05/2021   BMI 32.06 kg/m   Physical Exam  Constitutional:  oriented to person, place, and time. appears well-developed and well-nourished. No distress.  HENT: [place]/AT, PERRLA, no scleral icterus Mouth/Throat: Oropharynx is clear and moist. No oropharyngeal exudate.  Cardiovascular: Normal rate, regular rhythm and normal heart sounds. Exam reveals no gallop and no friction rub.  No murmur heard.  Pulmonary/Chest: Effort normal and breath sounds normal. No respiratory distress.  has no wheezes.  Neck = supple, no nuchal rigidity Abdominal: Soft. Bowel sounds are normal.  exhibits no distension. There is no tenderness.  Lymphadenopathy: no cervical adenopathy. No axillary adenopathy Neurological: alert and oriented to person, place, and time.  Skin: Skin is warm and dry. No rash noted. No erythema.  Psychiatric: a normal mood and affect.  behavior is normal.         Lab Results  Component Value Date    CD4TCELL 37 05/13/2022         Lab Results  Component Value Date    CD4TABS 642 05/13/2022    CD4TABS 468 09/29/2021    CD4TABS 747 02/20/2021         Lab Results  Component Value Date    HIV1RNAQUANT Not Detected 05/13/2022         Lab Results  Component Value Date     HEPBSAB NEG 02/08/2009         Lab Results  Component Value Date    LABRPR NON-REACTIVE 05/13/2022      CBC      Lab Results  Component Value Date    WBC 4.7 05/13/2022    RBC 4.26 05/13/2022    HGB 12.8 05/13/2022    HCT 37.8 05/13/2022    PLT 360 05/13/2022    MCV 88.7 05/13/2022    MCH 30.0 05/13/2022    MCHC 33.9 05/13/2022    RDW 12.0 05/13/2022    LYMPHSABS 1,918 05/13/2022    MONOABS 0.3 04/01/2022    EOSABS 141 05/13/2022      BMET      Lab Results  Component Value Date    NA  139 05/13/2022    K 3.7 05/13/2022    CL 100 05/13/2022    CO2 31 05/13/2022    GLUCOSE 131 (H) 05/13/2022    BUN 15 05/13/2022    CREATININE 0.67 05/13/2022    CALCIUM 10.1 05/13/2022    GFRNONAA >60 04/03/2022    GFRAA 96 02/20/2021    Assessment and Plan   Hiv disease= well controlled. Will refill biktarvy.   Long term medication management = cr is stable   IDDM = celebrating her new chnages and adherence to diet and insulin regimen/adherence; working on her brother.   Health maintenance - did mammo (in may) also had colonoscopy ( June). Doing much better. Aiming for increasing exercise. Utd on vaccines. This fall is flu, new covid booster

## 2022-08-16 DIAGNOSIS — B20 Human immunodeficiency virus [HIV] disease: Secondary | ICD-10-CM | POA: Diagnosis not present

## 2022-08-16 DIAGNOSIS — G4733 Obstructive sleep apnea (adult) (pediatric): Secondary | ICD-10-CM | POA: Diagnosis not present

## 2022-08-16 DIAGNOSIS — R062 Wheezing: Secondary | ICD-10-CM | POA: Diagnosis not present

## 2022-08-16 DIAGNOSIS — R0902 Hypoxemia: Secondary | ICD-10-CM | POA: Diagnosis not present

## 2022-09-03 ENCOUNTER — Other Ambulatory Visit: Payer: Self-pay | Admitting: Student

## 2022-09-03 DIAGNOSIS — I5032 Chronic diastolic (congestive) heart failure: Secondary | ICD-10-CM

## 2022-09-16 DIAGNOSIS — B20 Human immunodeficiency virus [HIV] disease: Secondary | ICD-10-CM | POA: Diagnosis not present

## 2022-09-16 DIAGNOSIS — R0902 Hypoxemia: Secondary | ICD-10-CM | POA: Diagnosis not present

## 2022-09-16 DIAGNOSIS — R062 Wheezing: Secondary | ICD-10-CM | POA: Diagnosis not present

## 2022-09-16 DIAGNOSIS — G4733 Obstructive sleep apnea (adult) (pediatric): Secondary | ICD-10-CM | POA: Diagnosis not present

## 2022-09-28 ENCOUNTER — Encounter: Payer: Self-pay | Admitting: Internal Medicine

## 2022-09-28 ENCOUNTER — Other Ambulatory Visit: Payer: Self-pay

## 2022-09-28 ENCOUNTER — Ambulatory Visit (INDEPENDENT_AMBULATORY_CARE_PROVIDER_SITE_OTHER): Payer: Medicaid Other | Admitting: Internal Medicine

## 2022-09-28 VITALS — BP 127/84 | HR 78 | Temp 97.9°F | Ht 63.0 in | Wt 209.0 lb

## 2022-09-28 DIAGNOSIS — Z79899 Other long term (current) drug therapy: Secondary | ICD-10-CM | POA: Diagnosis not present

## 2022-09-28 DIAGNOSIS — E119 Type 2 diabetes mellitus without complications: Secondary | ICD-10-CM | POA: Diagnosis not present

## 2022-09-28 DIAGNOSIS — B20 Human immunodeficiency virus [HIV] disease: Secondary | ICD-10-CM

## 2022-09-28 NOTE — Progress Notes (Signed)
RFV: follow -up for hiv disease  Patient ID: Leslie Gates, female   DOB: 22-Aug-1966, 56 y.o.   MRN: [id number]  HPI  Leslie Gates is a  12yoF with hiv disease, CD 4 count 642/VL< 20; on biktarvy. Bs at 230. Takes trulicity more consistently. Overall doing better.  Defers getting flu and covid. She reports low risk since she stays at home   Outpatient Encounter Medications as of 09/28/2022  Medication Sig   ACCU-CHEK GUIDE test strip USE TO TEST BLOOD SUGAR FOUR TIMES DAILY AS DIRECTED   Accu-Chek Softclix Lancets lancets USE TO TEST BLOOD SUGAR FOUR TIMES DAILY AS DIRECTED   amLODipine (NORVASC) 10 MG tablet TAKE ONE TABLET BY MOUTH AT NIGHT. - NEEDS APPT FOR FURTURE REFILLS- (Patient taking differently: Take 10 mg by mouth daily.)   atorvastatin (LIPITOR) 40 MG tablet TAKE ONE TABLET BY MOUTH DAILY - NEEDS APPT FOR FUTURE REFILLS-   bictegravir-emtricitabine-tenofovir AF (BIKTARVY) 50-200-25 MG TABS tablet Take 1 tablet by mouth daily.   blood glucose meter kit and supplies KIT Dispense based on patient and insurance preference. Use up to four times daily as directed.   Budeson-Glycopyrrol-Formoterol (BREZTRI AEROSPHERE) 160-9-4.8 MCG/ACT AERO Inhale 160 mcg into the lungs 2 (two) times daily.   cetirizine (ZYRTEC) 10 MG tablet Take 1 tablet (10 mg total) by mouth daily.   Dulaglutide (TRULICITY) 1.5 ZS/0.1UX SOPN Inject 1.5 mg into the skin once a week.   furosemide (LASIX) 20 MG tablet TAKE ONE TABLET BY MOUTH EVERY DAY -MAY TAKE EXTRA TABLET EVERY DAY AS NEEDED FOR WEIGHT GAIN of 3 lbs or more overnight   Multiple Vitamin (TAB-A-VITE) TABS TAKE ONE TABLET BY MOUTH EVERY DAY   NAPHCON-A 0.025-0.3 % ophthalmic solution INSTILL 1 DROP INTO BOTH EYES FOUR TIMES A DAY AS NEEDED FOR IRRITATION (Patient taking differently: Place 1 drop into both eyes 4 (four) times daily as needed for eye irritation.)   Olopatadine HCl (PATADAY) 0.2 % SOLN Apply 1 drop to eye daily as needed. (Patient taking  differently: Apply 1 drop to eye daily as needed (For dry eyes).)   omeprazole (PRILOSEC) 40 MG capsule TAKE ONE CAPSULE BY MOUTH EVERY DAY (Patient taking differently: Take 40 mg by mouth daily as needed (GERD).)   OXYGEN Inhale 2 L into the lungs at bedtime.   PROAIR HFA 108 (90 Base) MCG/ACT inhaler INHALE 1 OR 2 PUFFS INTO THE LUNGS EVERY 6 HOURS AS NEEDED FOR WHEEZING OR SHORTNESS OF BREATH (Patient taking differently: Inhale 1 puff into the lungs every 6 (six) hours as needed for shortness of breath.)   RESTASIS 0.05 % ophthalmic emulsion Place 1 drop into both eyes 2 (two) times daily.   spironolactone (ALDACTONE) 25 MG tablet TAKE ONE-HALF TABLET BY MOUTH NIGHTLY AT BEDTIME   SYMBICORT 160-4.5 MCG/ACT inhaler INHALE 2 PUFFS INTO THE LUNGS TWO TIMES DAILY (Patient taking differently: Inhale 2 puffs into the lungs daily.)   Insulin Pen Needle 31G X 5 MM MISC 1 each by Does not apply route daily. (Patient not taking: Reported on 08/10/2022)   No facility-administered encounter medications on file as of 09/28/2022.     Patient Active Problem List   Diagnosis Date Noted   Seasonal allergies 08/10/2022   Healthcare maintenance 04/10/2022   Type 2 diabetes mellitus with diabetic neuropathy, unspecified ([place]) 03/13/2022   Dysplasia of cervix, low grade (CIN 1) 11/13/2020   Type 2 diabetes mellitus with hyperglycemia ([place]) 12/15/2019   Hyperlipidemia associated with type 2 diabetes mellitus (  [place]) 04/29/2018   Chronic diastolic heart failure ([place]) 06/25/2017   Moderate dysplasia of cervix 07/10/2013   History of angioedema 09/09/2011   Obesity hypoventilation syndrome ([place]) 07/10/2010   Obesity, Class I, BMI 30-34.9 07/01/2010   Obstructive sleep apnea 06/27/2010   Pulmonary hypertension ([place]) 05/28/2010   Hypertension associated with type 2 diabetes mellitus ([place]) 03/22/2007   HIV disease ([place]) 06/21/2002     Health Maintenance Due  Topic Date Due   Zoster Vaccines- Shingrix (1 of 2)  Never done   COVID-19 Vaccine (3 - Pfizer risk series) 04/24/2020   OPHTHALMOLOGY EXAM  12/20/2021   INFLUENZA VACCINE  06/16/2022     Review of Systems 12 point ros is negative except what is mentioned above Physical Exam   BP 127/84   Pulse 78   Temp 97.9 F (36.6 C) (Oral)   Ht _0  (1.6 m)   Wt 209 lb (94.8 kg)   LMP 12/05/2021   SpO2 95%   BMI 37.02 kg/m   Physical Exam  Constitutional:  oriented to person, place, and time. appears well-developed and well-nourished. No distress.  HENT: [place]/AT, PERRLA, no scleral icterus Mouth/Throat: Oropharynx is clear and moist. No oropharyngeal exudate.  Cardiovascular: Normal rate, regular rhythm and normal heart sounds. Exam reveals no gallop and no friction rub.  No murmur heard.  Pulmonary/Chest: Effort normal and breath sounds normal. No respiratory distress.  has no wheezes.  Neck = supple, no nuchal rigidity Abdominal: Soft. Bowel sounds are normal.  exhibits no distension. There is no tenderness.  Lymphadenopathy: no cervical adenopathy. No axillary adenopathy Neurological: alert and oriented to person, place, and time.  Skin: Skin is warm and dry. No rash noted. No erythema.  Psychiatric: a normal mood and affect.  behavior is normal.   Lab Results  Component Value Date   CD4TCELL 37 05/13/2022   Lab Results  Component Value Date   CD4TABS 642 05/13/2022   CD4TABS 468 09/29/2021   CD4TABS 747 02/20/2021   Lab Results  Component Value Date   HIV1RNAQUANT Not Detected 05/13/2022   Lab Results  Component Value Date   HEPBSAB NEG 02/08/2009   Lab Results  Component Value Date   LABRPR NON-REACTIVE 05/13/2022    CBC Lab Results  Component Value Date   WBC 4.7 05/13/2022   RBC 4.26 05/13/2022   HGB 12.8 05/13/2022   HCT 37.8 05/13/2022   PLT 360 05/13/2022   MCV 88.7 05/13/2022   MCH 30.0 05/13/2022   MCHC 33.9 05/13/2022   RDW 12.0 05/13/2022   LYMPHSABS 1,918 05/13/2022   MONOABS 0.3 04/01/2022    EOSABS 141 05/13/2022    BMET Lab Results  Component Value Date   NA 139 05/13/2022   K 3.7 05/13/2022   CL 100 05/13/2022   CO2 31 05/13/2022   GLUCOSE 131 (H) 05/13/2022   BUN 15 05/13/2022   CREATININE 0.67 05/13/2022   CALCIUM 10.1 05/13/2022   GFRNONAA >60 04/03/2022   GFRAA 96 02/20/2021      Assessment and Plan  HIV disease= continue on biktarvy and plan to get labs to see that she is still undetectable  Long term medication management = will check cr to ensure still at baseline  T2DM = appears to be slightly better controlled. Will check hemoglobin A1c  Health maintenance = uptodate on mammo and gyn. Deferred vaccines

## 2022-09-30 LAB — CBC WITH DIFFERENTIAL/PLATELET
Absolute Monocytes: 226 cells/uL (ref 200–950)
Basophils Absolute: 48 cells/uL (ref 0–200)
Basophils Relative: 1.3 %
Eosinophils Absolute: 137 cells/uL (ref 15–500)
Eosinophils Relative: 3.7 %
HCT: 43.3 % (ref 35.0–45.0)
Hemoglobin: 14.4 g/dL (ref 11.7–15.5)
Lymphs Abs: 1861 cells/uL (ref 850–3900)
MCH: 29.4 pg (ref 27.0–33.0)
MCHC: 33.3 g/dL (ref 32.0–36.0)
MCV: 88.4 fL (ref 80.0–100.0)
MPV: 10.7 fL (ref 7.5–12.5)
Monocytes Relative: 6.1 %
Neutro Abs: 1428 cells/uL — ABNORMAL LOW (ref 1500–7800)
Neutrophils Relative %: 38.6 %
Platelets: 356 10*3/uL (ref 140–400)
RBC: 4.9 10*6/uL (ref 3.80–5.10)
RDW: 12 % (ref 11.0–15.0)
Total Lymphocyte: 50.3 %
WBC: 3.7 10*3/uL — ABNORMAL LOW (ref 3.8–10.8)

## 2022-09-30 LAB — COMPLETE METABOLIC PANEL WITH GFR
AG Ratio: 1.2 (calc) (ref 1.0–2.5)
ALT: 17 U/L (ref 6–29)
AST: 18 U/L (ref 10–35)
Albumin: 4.6 g/dL (ref 3.6–5.1)
Alkaline phosphatase (APISO): 107 U/L (ref 37–153)
BUN: 17 mg/dL (ref 7–25)
CO2: 32 mmol/L (ref 20–32)
Calcium: 10.3 mg/dL (ref 8.6–10.4)
Chloride: 93 mmol/L — ABNORMAL LOW (ref 98–110)
Creat: 0.77 mg/dL (ref 0.50–1.03)
Globulin: 3.7 g/dL (calc) (ref 1.9–3.7)
Glucose, Bld: 368 mg/dL — ABNORMAL HIGH (ref 65–99)
Potassium: 3.9 mmol/L (ref 3.5–5.3)
Sodium: 135 mmol/L (ref 135–146)
Total Bilirubin: 0.5 mg/dL (ref 0.2–1.2)
Total Protein: 8.3 g/dL — ABNORMAL HIGH (ref 6.1–8.1)
eGFR: 91 mL/min/{1.73_m2} (ref >=60)

## 2022-09-30 LAB — LIPID PANEL
Cholesterol: 261 mg/dL — ABNORMAL HIGH (ref <200)
HDL: 76 mg/dL (ref >=50)
LDL Cholesterol (Calc): 149 mg/dL (calc) — ABNORMAL HIGH
Non-HDL Cholesterol (Calc): 185 mg/dL (calc) — ABNORMAL HIGH (ref <130)
Total CHOL/HDL Ratio: 3.4 (calc) (ref <5.0)
Triglycerides: 216 mg/dL — ABNORMAL HIGH (ref <150)

## 2022-09-30 LAB — HIV-1 RNA QUANT-NO REFLEX-BLD
HIV 1 RNA Quant: NOT DETECTED Copies/mL
HIV-1 RNA Quant, Log: NOT DETECTED Log cps/mL

## 2022-09-30 LAB — T-HELPER CELL (CD4) - (RCID CLINIC ONLY)
CD4 % Helper T Cell: 37 % (ref 33–65)
CD4 T Cell Abs: 618 /uL (ref 400–1790)

## 2022-09-30 LAB — RPR: RPR Ser Ql: NONREACTIVE

## 2022-10-07 ENCOUNTER — Other Ambulatory Visit: Payer: Self-pay | Admitting: Family Medicine

## 2022-10-16 DIAGNOSIS — G4733 Obstructive sleep apnea (adult) (pediatric): Secondary | ICD-10-CM | POA: Diagnosis not present

## 2022-10-16 DIAGNOSIS — R062 Wheezing: Secondary | ICD-10-CM | POA: Diagnosis not present

## 2022-10-16 DIAGNOSIS — R0902 Hypoxemia: Secondary | ICD-10-CM | POA: Diagnosis not present

## 2022-10-16 DIAGNOSIS — B20 Human immunodeficiency virus [HIV] disease: Secondary | ICD-10-CM | POA: Diagnosis not present

## 2022-10-20 ENCOUNTER — Other Ambulatory Visit: Payer: Self-pay

## 2022-10-20 MED ORDER — ACCU-CHEK GUIDE VI STRP: ORAL_STRIP | 5 refills | Status: DC

## 2022-10-21 ENCOUNTER — Other Ambulatory Visit: Payer: Self-pay | Admitting: Student

## 2022-10-21 DIAGNOSIS — I5032 Chronic diastolic (congestive) heart failure: Secondary | ICD-10-CM

## 2022-10-27 ENCOUNTER — Other Ambulatory Visit: Payer: Self-pay

## 2022-10-27 DIAGNOSIS — E119 Type 2 diabetes mellitus without complications: Secondary | ICD-10-CM

## 2022-10-27 MED ORDER — OLOPATADINE HCL 0.2 % OP SOLN: 1.0000 [drp] | Freq: Every day | OPHTHALMIC | 3 refills | Status: DC | PRN

## 2022-11-16 DIAGNOSIS — R062 Wheezing: Secondary | ICD-10-CM | POA: Diagnosis not present

## 2022-11-16 DIAGNOSIS — B20 Human immunodeficiency virus [HIV] disease: Secondary | ICD-10-CM | POA: Diagnosis not present

## 2022-11-16 DIAGNOSIS — G4733 Obstructive sleep apnea (adult) (pediatric): Secondary | ICD-10-CM | POA: Diagnosis not present

## 2022-11-16 DIAGNOSIS — R0902 Hypoxemia: Secondary | ICD-10-CM | POA: Diagnosis not present

## 2022-11-17 ENCOUNTER — Ambulatory Visit (HOSPITAL_BASED_OUTPATIENT_CLINIC_OR_DEPARTMENT_OTHER): Payer: Medicaid Other | Admitting: Pulmonary Disease

## 2022-11-26 ENCOUNTER — Ambulatory Visit: Payer: Medicaid Other | Admitting: Pharmacist

## 2022-12-01 ENCOUNTER — Ambulatory Visit (INDEPENDENT_AMBULATORY_CARE_PROVIDER_SITE_OTHER): Payer: Medicaid Other | Admitting: Obstetrics

## 2022-12-01 ENCOUNTER — Encounter: Payer: Self-pay | Admitting: Obstetrics

## 2022-12-01 VITALS — BP 126/79 | HR 71 | Ht 63.0 in | Wt 199.5 lb

## 2022-12-01 DIAGNOSIS — E785 Hyperlipidemia, unspecified: Secondary | ICD-10-CM | POA: Diagnosis not present

## 2022-12-01 DIAGNOSIS — I110 Hypertensive heart disease with heart failure: Secondary | ICD-10-CM | POA: Diagnosis not present

## 2022-12-01 DIAGNOSIS — E669 Obesity, unspecified: Secondary | ICD-10-CM | POA: Diagnosis not present

## 2022-12-01 DIAGNOSIS — J45909 Unspecified asthma, uncomplicated: Secondary | ICD-10-CM | POA: Diagnosis not present

## 2022-12-01 DIAGNOSIS — I5032 Chronic diastolic (congestive) heart failure: Secondary | ICD-10-CM | POA: Diagnosis not present

## 2022-12-01 DIAGNOSIS — L739 Follicular disorder, unspecified: Secondary | ICD-10-CM | POA: Diagnosis not present

## 2022-12-01 DIAGNOSIS — L662 Folliculitis decalvans: Secondary | ICD-10-CM | POA: Diagnosis not present

## 2022-12-01 DIAGNOSIS — I2510 Atherosclerotic heart disease of native coronary artery without angina pectoris: Secondary | ICD-10-CM | POA: Diagnosis not present

## 2022-12-01 DIAGNOSIS — Z01411 Encounter for gynecological examination (general) (routine) with abnormal findings: Secondary | ICD-10-CM | POA: Diagnosis not present

## 2022-12-01 DIAGNOSIS — E66811 Obesity, class 1: Secondary | ICD-10-CM

## 2022-12-01 MED ORDER — CLINDAMYCIN PHOSPHATE 1 % EX SOLN: Freq: Two times a day (BID) | CUTANEOUS | 2 refills | Status: DC

## 2022-12-01 NOTE — Progress Notes (Signed)
GYN c/o bump on the vaginal. Pt has tried warm compresses and bump has drained some but is still painful.

## 2022-12-01 NOTE — Progress Notes (Signed)
Patient ID: Leslie Gates, female   DOB: 30-Sep-1966, 57 y.o.   MRN: [id number]  Chief Complaint  Patient presents with   Gynecologic Exam    HPI Leslie Gates is a 57 y.o. female.  Complains of " boil  " on in vaginal area.  Has responded to warm compresses, and has expressed some fluid.  She does shave. HPI  Past Medical History:  Diagnosis Date   Allergic rhinitis    Allergy    Anemia    Anxiety    Arthritis    Asthma    Chronic otitis media    Coronary artery disease    Depression    Diastolic CHF, chronic (HCC)    Dysphagia    Genital warts 11/13/2020   GERD (gastroesophageal reflux disease)    History of cervical dysplasia    History of herpes genitalis 11/13/2020   History of herpes zoster    HTN (hypertension)    Human immunodeficiency virus (HIV) ([place]) 2001   Hyperlipidemia    Obesity    OSA (obstructive sleep apnea)    Oxygen deficiency    Pulmonary HTN ([place])    Sleep apnea    Wrist fracture, bilateral     Past Surgical History:  Procedure Laterality Date   ADENOIDECTOMY     CESAREAN SECTION     NASAL SINUS SURGERY  2004   [facility]   RIGHT HEART CATHETERIZATION N/A 02/01/2012   Procedure: RIGHT HEART CATH;  Surgeon: Jolaine Artist, MD;  Location: [facility] CATH [facility];  Service: Cardiovascular;  Laterality: N/A;   TYMPANOSTOMY TUBE PLACEMENT     as a child   WRIST FRACTURE SURGERY  2006   bilateral - [facility]    Family History  Problem Relation Age of Onset   Other Father        GSW   Hypertension Father    Kidney disease Father        was on dialysis prior to death   Alcohol abuse Father        liver failure from alcohol use   Other Mother 67       ESRD   Hyperlipidemia Mother    Hypertension Mother    Alcohol abuse Mother    Hypertension Other    Hypertension Sister    Alcohol abuse Sister    Hypertension Brother    Alcohol abuse Brother     Social History Social History   Tobacco Use   Smoking status: Never   Smokeless tobacco:  Never  Vaping Use   Vaping Use: Never used  Substance Use Topics   Alcohol use: No    Alcohol/week: 0.0 standard drinks of alcohol   Drug use: No    Allergies  Allergen Reactions   Lisinopril Swelling    Very swollen bottom lip    Current Outpatient Medications  Medication Sig Dispense Refill   Accu-Chek Softclix Lancets lancets USE TO TEST BLOOD SUGAR FOUR TIMES DAILY AS DIRECTED 100 each 5   amLODipine (NORVASC) 10 MG tablet TAKE ONE TABLET BY MOUTH AT NIGHT. - NEEDS APPT FOR FURTURE REFILLS- (Patient taking differently: Take 10 mg by mouth daily.) 90 tablet 3   atorvastatin (LIPITOR) 40 MG tablet TAKE ONE TABLET BY MOUTH DAILY - NEEDS APPT FOR FUTURE REFILLS- 90 tablet 3   bictegravir-emtricitabine-tenofovir AF (BIKTARVY) 50-200-25 MG TABS tablet Take 1 tablet by mouth daily. 30 tablet 11   blood glucose meter kit and supplies KIT Dispense based on patient and insurance  preference. Use up to four times daily as directed. 1 each 0   Budeson-Glycopyrrol-Formoterol (BREZTRI AEROSPHERE) 160-9-4.8 MCG/ACT AERO Inhale 160 mcg into the lungs 2 (two) times daily. 5.9 g 0   cetirizine (ZYRTEC) 10 MG tablet Take 1 tablet (10 mg total) by mouth daily. 90 tablet 1   clindamycin (CLEOCIN T) 1 % external solution Apply topically 2 (two) times daily. 60 mL 2   Dulaglutide (TRULICITY) 1.5 MG/0.5ML SOPN Inject 1.5 mg into the skin once a week. 6 mL 3   furosemide (LASIX) 20 MG tablet TAKE ONE TABLET BY MOUTH EVERY DAY -MAY TAKE EXTRA TABLET EVERY DAY AS NEEDED FOR WEIGHT GAIN OF 3 LBS OR MORE OVERNIGHT 30 tablet 1   glucose blood (ACCU-CHEK GUIDE) test strip USE TO TEST BLOOD SUGAR FOUR TIMES DAILY AS DIRECTED 100 strip 5   Multiple Vitamin (TAB-A-VITE) TABS TAKE ONE TABLET BY MOUTH EVERY DAY 30 tablet 11   NAPHCON-A 0.025-0.3 % ophthalmic solution INSTILL 1 DROP INTO BOTH EYES FOUR TIMES A DAY AS NEEDED FOR IRRITATION (Patient taking differently: Place 1 drop into both eyes 4 (four) times daily as  needed for eye irritation.) 15 mL 0   Olopatadine HCl (PATADAY) 0.2 % SOLN Apply 1 drop to eye daily as needed. 2.5 mL 3   omeprazole (PRILOSEC) 40 MG capsule TAKE ONE CAPSULE BY MOUTH EVERY DAY (Patient taking differently: Take 40 mg by mouth daily as needed (GERD).) 90 capsule 1   OXYGEN Inhale 2 L into the lungs at bedtime.     PROAIR HFA 108 (90 Base) MCG/ACT inhaler INHALE 1 OR 2 PUFFS INTO THE LUNGS EVERY 6 HOURS AS NEEDED FOR WHEEZING OR SHORTNESS OF BREATH (Patient taking differently: Inhale 1 puff into the lungs every 6 (six) hours as needed for shortness of breath.) 8.5 g 0   RESTASIS 0.05 % ophthalmic emulsion Place 1 drop into both eyes 2 (two) times daily.     spironolactone (ALDACTONE) 25 MG tablet TAKE ONE-HALF TABLET BY MOUTH NIGHTLY AT BEDTIME 90 tablet 5   SYMBICORT 160-4.5 MCG/ACT inhaler INHALE 2 PUFFS INTO THE LUNGS TWO TIMES DAILY (Patient taking differently: Inhale 2 puffs into the lungs daily.) 10.2 g 0   Insulin Pen Needle 31G X 5 MM MISC 1 each by Does not apply route daily. (Patient not taking: Reported on 08/10/2022) 100 each 3   No current facility-administered medications for this visit.    Review of Systems Review of Systems Constitutional: negative for fatigue and weight loss Respiratory: negative for cough and wheezing Cardiovascular: negative for chest pain, fatigue and palpitations Gastrointestinal: negative for abdominal pain and change in bowel habits Genitourinary: positive for boil in vaginal area  Integument/breast: negative for nipple discharge Musculoskeletal:negative for myalgias Neurological: negative for gait problems and tremors Behavioral/Psych: negative for abusive relationship, depression Endocrine: negative for temperature intolerance      Blood pressure 126/79, pulse 71, height 5\' 3"  (1.6 m), weight 199 lb 8 oz (90.5 kg), last menstrual period 12/05/2021.  Physical Exam Physical Exam General:   Alert and no distress  Skin:   no rash or  abnormalities  Lungs:   clear to auscultation bilaterally  Heart:   regular rate and rhythm, S1, S2 normal, no murmur, click, rub or gallop  Breasts:   Not examined  Abdomen:  normal findings: no organomegaly, soft, non-tender and no hernia  Pelvis:  External genitalia: boil noted in mons Urinary system: urethral meatus normal and bladder without fullness, nontender Vaginal: not  examined Cervix: not examined Adnexa: not examined Uterus: )not examined    I have spent a total of 20 minutes of face-to-face time, excluding clinical staff time, reviewing notes and preparing to see patient, ordering tests and/or medications, and counseling the patient.   Data Reviewed Labs  Assessment     1. Folliculitis Rx: - clindamycin (CLEOCIN T) 1 % external solution; Apply topically 2 (two) times daily.  Dispense: 60 mL; Refill: 2  2. Obesity, Class I, BMI 30-34.9 - weight reduction recommended     Plan   Follow up prn  Meds ordered this encounter  Medications   clindamycin (CLEOCIN T) 1 % external solution    Sig: Apply topically 2 (two) times daily.    Dispense:  60 mL    Refill:  2    Lerline Valdivia A. Marlin Brys` MD 12/01/2022

## 2022-12-17 DIAGNOSIS — R0902 Hypoxemia: Secondary | ICD-10-CM | POA: Diagnosis not present

## 2022-12-17 DIAGNOSIS — G4733 Obstructive sleep apnea (adult) (pediatric): Secondary | ICD-10-CM | POA: Diagnosis not present

## 2022-12-17 DIAGNOSIS — B20 Human immunodeficiency virus [HIV] disease: Secondary | ICD-10-CM | POA: Diagnosis not present

## 2022-12-17 DIAGNOSIS — R062 Wheezing: Secondary | ICD-10-CM | POA: Diagnosis not present

## 2022-12-22 ENCOUNTER — Telehealth: Payer: Self-pay

## 2022-12-22 ENCOUNTER — Other Ambulatory Visit (HOSPITAL_COMMUNITY): Payer: Self-pay

## 2022-12-22 NOTE — Telephone Encounter (Signed)
A Prior Authorization was initiated for this patients TRULICITY through CoverMyMeds.   Key: Leslie Gates

## 2022-12-23 ENCOUNTER — Other Ambulatory Visit: Payer: Self-pay | Admitting: Student

## 2022-12-23 DIAGNOSIS — I5032 Chronic diastolic (congestive) heart failure: Secondary | ICD-10-CM

## 2022-12-24 ENCOUNTER — Ambulatory Visit (HOSPITAL_BASED_OUTPATIENT_CLINIC_OR_DEPARTMENT_OTHER): Payer: Medicaid Other | Admitting: Pulmonary Disease

## 2023-01-04 ENCOUNTER — Other Ambulatory Visit: Payer: Self-pay

## 2023-01-04 MED ORDER — ACCU-CHEK SOFTCLIX LANCETS MISC: 5 refills | Status: DC

## 2023-01-08 ENCOUNTER — Other Ambulatory Visit: Payer: Self-pay | Admitting: Student

## 2023-01-08 DIAGNOSIS — J302 Other seasonal allergic rhinitis: Secondary | ICD-10-CM

## 2023-01-15 DIAGNOSIS — G4733 Obstructive sleep apnea (adult) (pediatric): Secondary | ICD-10-CM | POA: Diagnosis not present

## 2023-01-15 DIAGNOSIS — R062 Wheezing: Secondary | ICD-10-CM | POA: Diagnosis not present

## 2023-01-15 DIAGNOSIS — R0902 Hypoxemia: Secondary | ICD-10-CM | POA: Diagnosis not present

## 2023-01-15 DIAGNOSIS — B20 Human immunodeficiency virus [HIV] disease: Secondary | ICD-10-CM | POA: Diagnosis not present

## 2023-02-06 ENCOUNTER — Other Ambulatory Visit: Payer: Self-pay | Admitting: Student

## 2023-02-06 DIAGNOSIS — I5032 Chronic diastolic (congestive) heart failure: Secondary | ICD-10-CM

## 2023-02-08 ENCOUNTER — Other Ambulatory Visit: Payer: Self-pay

## 2023-02-08 DIAGNOSIS — I152 Hypertension secondary to endocrine disorders: Secondary | ICD-10-CM

## 2023-02-08 MED ORDER — AMLODIPINE BESYLATE 10 MG PO TABS: 10.0000 mg | ORAL_TABLET | Freq: Every day | ORAL | 1 refills | Status: DC

## 2023-02-15 DIAGNOSIS — G4733 Obstructive sleep apnea (adult) (pediatric): Secondary | ICD-10-CM | POA: Diagnosis not present

## 2023-02-15 DIAGNOSIS — R062 Wheezing: Secondary | ICD-10-CM | POA: Diagnosis not present

## 2023-02-15 DIAGNOSIS — B20 Human immunodeficiency virus [HIV] disease: Secondary | ICD-10-CM | POA: Diagnosis not present

## 2023-02-15 DIAGNOSIS — R0902 Hypoxemia: Secondary | ICD-10-CM | POA: Diagnosis not present

## 2023-02-24 ENCOUNTER — Encounter (INDEPENDENT_AMBULATORY_CARE_PROVIDER_SITE_OTHER): Payer: Medicaid Other | Admitting: Pharmacist

## 2023-02-24 VITALS — BP 151/92 | HR 73 | Wt 191.4 lb

## 2023-02-24 DIAGNOSIS — E1165 Type 2 diabetes mellitus with hyperglycemia: Secondary | ICD-10-CM

## 2023-02-24 LAB — POCT GLYCOSYLATED HEMOGLOBIN (HGB A1C): HbA1c, POC (controlled diabetic range): 13.2 % — AB (ref 0.0–7.0)

## 2023-02-24 NOTE — Assessment & Plan Note (Signed)
LIBERATE Study:  -Patient provided verbal consent to participate in the study. Consent documented in electronic medical record.  -Applied [employer] 3 CGM onto patient.  -Provided education on [place] 3 CGM. Collaborated to ensure Leslie Gates 3 app was downloaded on patient's phone. Educated on how to place sensor every 14 days, patient placed first sensor correctly and verbalized understanding of use, removal, and how to place next sensor. Discussed alarms. 8 sensors provided for a 3 month supply. Educated to contact the office if the sensor falls off early and replacements are needed before their next [employer].  Diabetes diagnosed in 2021. Patient is able to verbalize appropriate hypoglycemia management plan. Medication adherence appears good. Control is suboptimal due to elevated A1c of 13.2% today. -Restarted basal insulin Lantus (insulin glargine) 18 units daily -Increased dose of GLP-1 Trulicity (dulaglutide) from 1.5 mg to 3 mg weekly (patient instructed to take two injections at the same time weekly of the 1.5mg  dose).  -Extensively discussed pathophysiology of diabetes, recommended lifestyle interventions, dietary effects on blood sugar control.  -Counseled on s/sx of and management of hypoglycemia.  -Next A1c anticipated 05/26/23.

## 2023-02-24 NOTE — Research (Signed)
S:     Chief Complaint  Patient presents with   Medication Management    T2DM management   57 y.o. female who presents for diabetes evaluation, education, and management in the context of the LIBERATE Study.   PMH is significant for T2DM.  Patient was referred and last seen by Primary Care Provider, Dr. Claudean Severance, on 08/10/22.  At last visit, patient's A1c was improving but she was not measuring BG on a regular basis.   Today, patient arrives in good spirits and presents without any assistance.  Patient reports Diabetes was diagnosed in 2021.   Current diabetes medications include: Trulicity (dulaglutide) 1.5 mg weekly Current hypertension medications include: amlodipine 10 mg daily, spironolactone 12.5 mg daily  Patient reports adherence to taking all medications as prescribed.   Do you feel that your medications are working for you? No. Patient checks BG at home and reports values in the high 200s up to 300s. Have you been experiencing any side effects to the medications prescribed? no Do you have any problems obtaining medications due to transportation or finances? no Insurance coverage: [place] Medicaid  Patient denies hypoglycemic events.  Patient reports nocturia (nighttime urination). 5-10 times per night Patient reports neuropathy (nerve pain). Patient reports worsening visual changes. Patient denies self foot exams.   Within the past 12 months, did you worry whether your food would run out before you got money to buy more? no Within the past 12 months, did the food you bought run out, and you didn't have money to get more? no  O:   Review of Systems  All other systems reviewed and are negative.   Physical Exam Constitutional:      Appearance: Normal appearance. She is normal weight.  Neurological:     Mental Status: She is alert.  Psychiatric:        Mood and Affect: Mood normal.        Behavior: Behavior normal.        Thought Content: Thought content  normal.        Judgment: Judgment normal.      Lab Results  Component Value Date   HGBA1C 13.2 (A) 02/24/2023     Vitals:   02/24/23 1002 02/24/23 1003  BP: (!) 154/99 (!) 151/92  Pulse: 73   SpO2: 96%      Lipid Panel     Component Value Date/Time   CHOL 261 (H) 09/28/2022 1108   CHOL 156 03/13/2022 1321   TRIG 216 (H) 09/28/2022 1108   HDL 76 09/28/2022 1108   HDL 69 03/13/2022 1321   CHOLHDL 3.4 09/28/2022 1108   VLDL 28 03/30/2017 1555   LDLCALC 149 (H) 09/28/2022 1108   LDLDIRECT 144 (H) 01/06/2021 1514    Clinical Atherosclerotic Cardiovascular Disease (ASCVD): Yes The 10-year ASCVD risk score (Arnett DK, et al., 2019) is: 18.3%   Values used to calculate the score:     Age: 64 years     Sex: Female     Is Non-Hispanic African American: Yes     Diabetic: Yes     Tobacco smoker: No     Systolic Blood Pressure: 151 mmHg     Is BP treated: Yes     HDL Cholesterol: 76 mg/dL     Total Cholesterol: 261 mg/dL   Patient is participating in a Managed Medicaid Plan:  Yes    A/P:  LIBERATE Study:  -Patient provided verbal consent to participate in the study. Consent documented in  electronic medical record.  -Applied Freestyle Libre 3 CGM onto patient.  -Provided education on [place] 3 CGM. Collaborated to ensure Josephine Igo 3 app was downloaded on patient's phone. Educated on how to place sensor every 14 days, patient placed first sensor correctly and verbalized understanding of use, removal, and how to place next sensor. Discussed alarms. 8 sensors provided for a 3 month supply. Educated to contact the office if the sensor falls off early and replacements are needed before their next [employer].  Diabetes diagnosed in 2021. Patient is able to verbalize appropriate hypoglycemia management plan. Medication adherence appears good. Control is suboptimal due to elevated A1c of 13.2% today. -Restarted basal insulin Lantus (insulin glargine) 18 units daily -Increased  dose of GLP-1 Trulicity (dulaglutide) from 1.5 mg to 3 mg weekly (patient instructed to take two injections at the same time weekly of the 1.5mg  dose).  -Extensively discussed pathophysiology of diabetes, recommended lifestyle interventions, dietary effects on blood sugar control.  -Counseled on s/sx of and management of hypoglycemia.  -Next A1c anticipated 05/26/23.   Written patient instructions provided. Patient verbalized understanding of treatment plan.  Total time in face to face counseling 40 minutes.    Follow-up:  Pharmacist 03/10/23. Patient seen with Revonda Standard, PharmD Candidate.

## 2023-02-24 NOTE — Progress Notes (Signed)
Reviewed and agree with Dr Koval's plan.   

## 2023-02-24 NOTE — Patient Instructions (Signed)
It was great seeing you today! Please bring all your medications to your next visit with [place].  Here are changes we are making to your medications:  Increase dose of Lantus (insulin glargine) to 18 units daily   Increase your Trulicity to 3 mg each week (Inject 2 pens of 1.5 mg each week until you receive 3 mg pens, then inject 1 pen each week)   Sensor Application If using the App, you can tap Help in the Main Menu to access an in-app tutorial on applying a Sensor. See below for instructions on how to download the app. Apply Sensors only on the back of your upper arm. If placed in other areas, the Sensor may not function properly and could give you inaccurate readings. Avoid areas with scars, moles, stretch marks, or lumps.   Select an area of skin that generally stays flat during your normal daily activities (no bending or folding). Choose a site that is at least 1 inch (2.5 cm) away from any injection sites. To prevent discomfort or skin irritation, you should select a different site other than the one most recently used. Wash application site using a plain soap, dry, and then clean with an alcohol wipe. This will help remove any oily residue that may prevent the sensor from sticking properly. Allow site to air dry before proceeding. Note: The area MUST be clean and dry, or the Sensor may not stay on for the full wear duration specified by your Sensor insert. 4. Unscrew the cap from the Sensor Applicator and set the cap aside.  5. Place the Sensor Applicator over the prepared site and push down firmly to apply the Sensor to your body. 6. Gently pull the Sensor Applicator away from your body. The Sensor should now be attached to your skin. 7. Make sure the Sensor is secure after application. Put the cap back on the Sensor Applicator. Discard the used Engineer, agricultural according to local regulations.  What If My Sensor Falls Off or What If My Sensor Isn't Working? Call [employer] Customer Care Team  at [phone] Available 7 days a week from 8AM-8PM EST, excluding holidays If yo have multiple sensors fall off prior to 14 days of use, contact [facility] at [phone]   The App Download the FreeStyle [place] 3 App in your phone's app store   Load the app and select get started now Create an account  Tap scan new sensor Follow the prompts on the screen. If your sensor does not sync, try moving your phone slowly around the sensor. Phone cases may affect scanning. This will be the only time you have to scan the sensor until you apply a new sensor in 14 days.  There will be a 60 minute start up period until the app will display your glucose reading   How To Share Your Readings With [place] Once in the app, go to settings -> connected apps -> LibreView -> Enter Practice ID -> [id number]

## 2023-03-03 ENCOUNTER — Other Ambulatory Visit: Payer: Self-pay | Admitting: Internal Medicine

## 2023-03-03 DIAGNOSIS — Z1231 Encounter for screening mammogram for malignant neoplasm of breast: Secondary | ICD-10-CM

## 2023-03-04 ENCOUNTER — Other Ambulatory Visit: Payer: Medicaid Other

## 2023-03-04 ENCOUNTER — Other Ambulatory Visit: Payer: Self-pay

## 2023-03-04 ENCOUNTER — Other Ambulatory Visit (HOSPITAL_COMMUNITY)
Admission: RE | Admit: 2023-03-04 | Discharge: 2023-03-04 | Disposition: A | Discharge status: Home / Self Care | Payer: Medicaid Other | Source: Ambulatory Visit | Attending: Internal Medicine | Admitting: Internal Medicine

## 2023-03-04 DIAGNOSIS — Z113 Encounter for screening for infections with a predominantly sexual mode of transmission: Secondary | ICD-10-CM | POA: Insufficient documentation

## 2023-03-04 DIAGNOSIS — B20 Human immunodeficiency virus [HIV] disease: Secondary | ICD-10-CM

## 2023-03-04 LAB — CBC WITH DIFFERENTIAL/PLATELET
Hemoglobin: 12.7 g/dL (ref 11.7–15.5)
Lymphs Abs: 1983 cells/uL (ref 850–3900)
MCH: 28.2 pg (ref 27.0–33.0)
MCHC: 32.2 g/dL (ref 32.0–36.0)
Neutro Abs: 1964 cells/uL (ref 1500–7800)

## 2023-03-05 LAB — URINE CYTOLOGY ANCILLARY ONLY
Chlamydia: NEGATIVE
Comment: NEGATIVE
Comment: NORMAL
Neisseria Gonorrhea: NEGATIVE

## 2023-03-05 LAB — COMPLETE METABOLIC PANEL WITH GFR
AG Ratio: 1.1 (calc) (ref 1.0–2.5)
AST: 26 U/L (ref 10–35)
Chloride: 103 mmol/L (ref 98–110)
Sodium: 141 mmol/L (ref 135–146)
Total Protein: 8 g/dL (ref 6.1–8.1)
eGFR: 103 mL/min/{1.73_m2} (ref >=60)

## 2023-03-05 LAB — T-HELPER CELL (CD4) - (RCID CLINIC ONLY)
CD4 % Helper T Cell: 32 % — ABNORMAL LOW (ref 33–65)
CD4 T Cell Abs: 577 /uL (ref 400–1790)

## 2023-03-06 LAB — COMPLETE METABOLIC PANEL WITH GFR
ALT: 21 U/L (ref 6–29)
Albumin: 4.1 g/dL (ref 3.6–5.1)
Alkaline phosphatase (APISO): 113 U/L (ref 37–153)
BUN: 13 mg/dL (ref 7–25)
CO2: 32 mmol/L (ref 20–32)
Calcium: 9.9 mg/dL (ref 8.6–10.4)
Creat: 0.67 mg/dL (ref 0.50–1.03)
Globulin: 3.9 g/dL (calc) — ABNORMAL HIGH (ref 1.9–3.7)
Glucose, Bld: 138 mg/dL — ABNORMAL HIGH (ref 65–99)
Potassium: 4 mmol/L (ref 3.5–5.3)
Total Bilirubin: 0.2 mg/dL (ref 0.2–1.2)

## 2023-03-06 LAB — CBC WITH DIFFERENTIAL/PLATELET
Absolute Monocytes: 391 cells/uL (ref 200–950)
Basophils Absolute: 51 cells/uL (ref 0–200)
Basophils Relative: 1.1 %
Eosinophils Absolute: 212 cells/uL (ref 15–500)
Eosinophils Relative: 4.6 %
HCT: 39.4 % (ref 35.0–45.0)
MCV: 87.4 fL (ref 80.0–100.0)
MPV: 10.2 fL (ref 7.5–12.5)
Monocytes Relative: 8.5 %
Neutrophils Relative %: 42.7 %
Platelets: 388 10*3/uL (ref 140–400)
RBC: 4.51 10*6/uL (ref 3.80–5.10)
RDW: 11.9 % (ref 11.0–15.0)
Total Lymphocyte: 43.1 %
WBC: 4.6 10*3/uL (ref 3.8–10.8)

## 2023-03-06 LAB — HIV-1 RNA QUANT-NO REFLEX-BLD
HIV 1 RNA Quant: NOT DETECTED Copies/mL
HIV-1 RNA Quant, Log: NOT DETECTED Log cps/mL

## 2023-03-10 ENCOUNTER — Encounter: Payer: Self-pay | Admitting: Pharmacist

## 2023-03-10 ENCOUNTER — Ambulatory Visit (INDEPENDENT_AMBULATORY_CARE_PROVIDER_SITE_OTHER): Payer: Medicaid Other | Admitting: Pharmacist

## 2023-03-10 VITALS — BP 130/87 | HR 67 | Wt 190.6 lb

## 2023-03-10 DIAGNOSIS — E1159 Type 2 diabetes mellitus with other circulatory complications: Secondary | ICD-10-CM | POA: Diagnosis not present

## 2023-03-10 DIAGNOSIS — I152 Hypertension secondary to endocrine disorders: Secondary | ICD-10-CM

## 2023-03-10 DIAGNOSIS — E1165 Type 2 diabetes mellitus with hyperglycemia: Secondary | ICD-10-CM | POA: Diagnosis not present

## 2023-03-10 MED ORDER — TRULICITY 3 MG/0.5ML ~~LOC~~ SOAJ: 3.0000 mg | SUBCUTANEOUS | 3 refills | Status: DC

## 2023-03-10 NOTE — Patient Instructions (Addendum)
It was nice to see you today!  Your goal blood sugar is 80-130 before eating and less than 180 after eating.  Medication Changes: -Continue Lantus (insulin glargine) 18 units daily  -Continue Trulicity (dulagltuide) 3 mg weekly  Monitor blood sugars at home and keep a log (glucometer or piece of paper) to bring with you to your next visit.  Keep up the good work with diet and exercise. Aim for a diet full of vegetables, fruit and lean meats (chicken, [place], fish). Try to limit salt intake by eating fresh or frozen vegetables (instead of canned), rinse canned vegetables prior to cooking and do not add any additional salt to meals.

## 2023-03-10 NOTE — Assessment & Plan Note (Signed)
Hypertension longstanding currently on medication therapy. Blood pressure goal of <130/80 mmHg. Medication adherence good. Blood pressure control is optimal due to . -Continued amlodipine 10 mg daily -Continued spironolactone 12.5 mg daily

## 2023-03-10 NOTE — Assessment & Plan Note (Signed)
Diabetes longstanding currently on medication therapy. Patient is able to verbalize appropriate hypoglycemia management plan. Medication adherence appears good. Control is optimal due to CGM report displaying GMI of 7.4% and significant improvement of glucose control. -Continued basal insulin Lantus (insulin glargine)18 units daily -Continued GLP-1 Trulicity (dulaglutide) 3 mg weekly (new prescription provided) -Extensively discussed pathophysiology of diabetes, recommended lifestyle interventions, dietary effects on blood sugar control.  -Counseled on s/sx of and management of hypoglycemia.  -Next A1c anticipated 05/26/23.

## 2023-03-10 NOTE — Progress Notes (Signed)
S:     Chief Complaint  Patient presents with   Medication Management    LIBERATE F/U   57 y.o. female who presents for diabetes evaluation, education, and management.  PMH is significant for T2DM, HTN Patient was referred and last seen by Primary Care Provider, Dr. Claudean Severance, on 08/10/22.  At last visit, Patient was enrolled in LIBERATE study, Libre 3 sensor was applied to patient's arm, and Lantus (insulin glargine) was restarted at 18 units daily.   Today, patient arrives in good spirits and presents without any assistance.  Current diabetes medications include: Lantus (insulin glargine) 18 units, Trulicity (dulaglutide) 3 mg weekly Current hypertension medications include: Amlodipine 10 mg daily, spironolactone 12.5 mg daily  Patient reports adherence to taking all medications as prescribed.   Do you feel that your medications are working for you? yes Have you been experiencing any side effects to the medications prescribed? no Do you have any problems obtaining medications due to transportation or finances? no Insurance coverage: [place] Medicaid  Patient denies hypoglycemic events.  Patient reported dietary habits: Patient reports her packaged oatmeal and packaged carrot juice are food items that sharply increases her glucose, has started to avoid these items and others that have similar effects.  Within the past 12 months, did you worry whether your food would run out before you got money to buy more? no Within the past 12 months, did the food you bought run out, and you didn't have money to get more? no   O:   Review of Systems  All other systems reviewed and are negative.   Physical Exam Constitutional:      Appearance: Normal appearance.  Pulmonary:     Effort: Pulmonary effort is normal.     Breath sounds: Normal breath sounds.  Neurological:     Mental Status: She is alert.  Psychiatric:        Mood and Affect: Mood normal.        Behavior: Behavior normal.         Thought Content: Thought content normal.        Judgment: Judgment normal.     Freestyle Libre 3 CGM Download:  % Time CGM is active: 95% Average Glucose: 169 mg/dL Glucose Management Indicator: 7.4%  Glucose Variability: 37.4% (goal <36%) Time in Goal:  - Time in range 70-180: 66% - Time above range: 34% (24% High 181-250 mg/dL, 69% Very High >629 mg/dL) - Time below range: 0%   Lab Results  Component Value Date   HGBA1C 13.2 (A) 02/24/2023   Vitals:   03/10/23 0856  BP: 130/87  Pulse: 67  SpO2: 100%    Lipid Panel     Component Value Date/Time   CHOL 261 (H) 09/28/2022 1108   CHOL 156 03/13/2022 1321   TRIG 216 (H) 09/28/2022 1108   HDL 76 09/28/2022 1108   HDL 69 03/13/2022 1321   CHOLHDL 3.4 09/28/2022 1108   VLDL 28 03/30/2017 1555   LDLCALC 149 (H) 09/28/2022 1108   LDLDIRECT 144 (H) 01/06/2021 1514    Clinical Atherosclerotic Cardiovascular Disease (ASCVD): No  The 10-year ASCVD risk score (Arnett DK, et al., 2019) is: 11.2%   Values used to calculate the score:     Age: 73 years     Sex: Female     Is Non-Hispanic African American: Yes     Diabetic: Yes     Tobacco smoker: No     Systolic Blood Pressure: 130 mmHg  Is BP treated: Yes     HDL Cholesterol: 76 mg/dL     Total Cholesterol: 261 mg/dL   Patient is participating in a Managed Medicaid Plan:  Yes   A/P: Diabetes longstanding currently on medication therapy. Patient is able to verbalize appropriate hypoglycemia management plan. Medication adherence appears good. Control is optimal due to CGM report displaying GMI of 7.4% and significant improvement of glucose control. -Continued basal insulin Lantus (insulin glargine)18 units daily -Continued GLP-1 Trulicity (dulaglutide) 3 mg weekly (new prescription provided) -Extensively discussed pathophysiology of diabetes, recommended lifestyle interventions, dietary effects on blood sugar control.  -Counseled on s/sx of and management of  hypoglycemia.  -Next A1c anticipated 05/26/23.   Hypertension longstanding currently on medication therapy. Blood pressure goal of <130/80 mmHg. Medication adherence good. Blood pressure control is optimal due to . -Continued amlodipine 10 mg daily -Continued spironolactone 12.5 mg daily  Written patient instructions provided. Patient verbalized understanding of treatment plan.  Total time in face to face counseling 30 minutes.    Follow-up:  Pharmacist 04/09/23  Patient seen with Jerry Caras, PharmD PGY-1 Pharmacy Resident and Revonda Standard, PharmD Candidate.

## 2023-03-10 NOTE — Progress Notes (Signed)
Reviewed and agree with Dr Koval's plan.   

## 2023-03-11 ENCOUNTER — Other Ambulatory Visit: Payer: Self-pay | Admitting: Obstetrics

## 2023-03-11 DIAGNOSIS — L739 Follicular disorder, unspecified: Secondary | ICD-10-CM

## 2023-03-17 DIAGNOSIS — G4733 Obstructive sleep apnea (adult) (pediatric): Secondary | ICD-10-CM | POA: Diagnosis not present

## 2023-03-17 DIAGNOSIS — R0902 Hypoxemia: Secondary | ICD-10-CM | POA: Diagnosis not present

## 2023-03-17 DIAGNOSIS — R062 Wheezing: Secondary | ICD-10-CM | POA: Diagnosis not present

## 2023-03-17 DIAGNOSIS — B20 Human immunodeficiency virus [HIV] disease: Secondary | ICD-10-CM | POA: Diagnosis not present

## 2023-03-18 ENCOUNTER — Ambulatory Visit: Payer: Medicaid Other | Admitting: Internal Medicine

## 2023-03-29 ENCOUNTER — Telehealth: Payer: Self-pay

## 2023-03-29 NOTE — Telephone Encounter (Signed)
Patient left a voicemail confirming when her next appointments were. Called patient back to clear up her scheduling with Dr. Drue Second. She has completed labs, but missed her follow up. She did not currently have her calendar in front of her and she would like to call back to schedule to ensure she is not overbooking.

## 2023-04-05 ENCOUNTER — Other Ambulatory Visit (HOSPITAL_BASED_OUTPATIENT_CLINIC_OR_DEPARTMENT_OTHER): Payer: Self-pay | Admitting: Pulmonary Disease

## 2023-04-05 ENCOUNTER — Encounter (HOSPITAL_BASED_OUTPATIENT_CLINIC_OR_DEPARTMENT_OTHER): Payer: Self-pay | Admitting: Pulmonary Disease

## 2023-04-05 ENCOUNTER — Ambulatory Visit (INDEPENDENT_AMBULATORY_CARE_PROVIDER_SITE_OTHER): Payer: Medicaid Other | Admitting: Pulmonary Disease

## 2023-04-05 VITALS — BP 116/82 | HR 71 | Temp 98.0°F | Ht 62.0 in | Wt 199.0 lb

## 2023-04-05 DIAGNOSIS — G4733 Obstructive sleep apnea (adult) (pediatric): Secondary | ICD-10-CM | POA: Diagnosis not present

## 2023-04-05 DIAGNOSIS — J9611 Chronic respiratory failure with hypoxia: Secondary | ICD-10-CM

## 2023-04-05 DIAGNOSIS — J4550 Severe persistent asthma, uncomplicated: Secondary | ICD-10-CM

## 2023-04-05 DIAGNOSIS — E662 Morbid (severe) obesity with alveolar hypoventilation: Secondary | ICD-10-CM

## 2023-04-05 MED ORDER — ALBUTEROL SULFATE HFA 108 (90 BASE) MCG/ACT IN AERS: 2.0000 | INHALATION_SPRAY | Freq: Four times a day (QID) | RESPIRATORY_TRACT | 5 refills | Status: DC | PRN

## 2023-04-05 MED ORDER — BREZTRI AEROSPHERE 160-9-4.8 MCG/ACT IN AERO: 2.0000 | INHALATION_SPRAY | Freq: Two times a day (BID) | RESPIRATORY_TRACT | 5 refills | Status: DC

## 2023-04-05 NOTE — Patient Instructions (Signed)
Follow up in 1 year.

## 2023-04-05 NOTE — Progress Notes (Signed)
[facility]  Chief Complaint  Patient presents with   Follow-up    Follow up. Patient stated she needs a refill and an order for an oxygen tube.      Past Surgical History:  She  has a past surgical history that includes Tympanostomy tube placement; Cesarean section; Nasal sinus surgery (2004); Wrist fracture surgery (2006); Adenoidectomy; and right heart catheterization (N/A, 02/01/2012).  Past Medical History:  Allergies, Anemia, Anxiety, OA, CAD, Depression, Diastolic CHF, GERD, HTN, HIV  Constitutional:  BP 116/82 (BP Location: Left Arm, Patient Position: Sitting, Cuff Size: Normal)   Pulse 71   Temp 98 F (36.7 C) (Oral)   Ht 5\' 2"  (1.575 m)   Wt 199 lb (90.3 kg)   LMP 12/05/2021   SpO2 96%   BMI 36.40 kg/m   Brief Summary:  Leslie Gates is a 57 y.o. female with obstructive sleep apnea, obesity hypoventilation syndrome, and asthma.      Subjective:   I last saw her in 2022.  Uses breztri twice per day.  Hasn't need albuterol much.  Not having cough, wheeze, or chest congestion.  Has some sinus congestion and itchy eyes from allergies this time of year, but not bad.  Uses CPAP nightly.  No issues with mask fit or pressure setting.  Needs new oxygen tubing.  She uses the extended length (green) tubing.  Physical Exam:   Appearance - well kempt   ENMT - no sinus tenderness, no oral exudate, no LAN, Mallampati 3 airway, no stridor  Respiratory - equal breath sounds bilaterally, no wheezing or rales  CV - s1s2 regular rate and rhythm, no murmurs  Ext - no clubbing, no edema  Skin - no rashes  Psych - normal mood and affect    Pulmonary testing:  PFT 01/29/14 >> FEV1 1.79 (81%), FEV1% 86, TLC 3.47 (74%), DLCO 71% PFT 04/29/15 >> FEV1 1.53 (66%), FEV1% 80, TLC 3.74 (76%), DLCO 46%  Sleep Tests:  PSG 06/08/04 >> RDI 92.1, oxygen nadir 57% ONO on RA 08/03/13 >> 203 min with SpO2 < 88% Split night 12/22/13 >> AHI  27, SaO2 low 77%. CPAP 12 cm H2O >> AHI 5.9, +R, +S. Did not need supplemental oxygen. ONO with CPAP and 2 liters 02/13/14 >> Test time 8 hrs 36 min. Basal SpO2 98%, low SpO2 63% (artifact). Spent 0.4 min with SpO2 < 88%.  CPAP 03/06/23 to 04/04/23 >> used on 30 of 30 nights with average 7 hrs 15 min.  Average AHI 1.9 with CPAP 12 cm H2O  Cardiac Tests:  Echo 05/10/18 >> EF 60 to 65%  Social History:  She  reports that she has never smoked. She has never used smokeless tobacco. She reports that she does not drink alcohol and does not use drugs.  Family History:  Her family history includes Alcohol abuse in her brother, father, mother, and sister; Hyperlipidemia in her mother; Hypertension in her brother, father, mother, sister, and another family member; Kidney disease in her father; Other in her father; Other (age of onset: 22) in her mother.     Assessment/Plan:   Allergic asthma and rhinitis. - symbicort wasn't effective - continue breztri two puffs bid - prn albuterol - prn OTC antihistamine  Obstructive sleep apnea. - she is compliant with CPAP and reports benefit from therapy - uses [employer] for her DME - current CPAP ordered June 2020 - continue CPAP 12 cm H2O   Chronic respiratory failure 2nd  to obesity hypoventilation syndrome. - continue 2 liters supplemental oxygen at night with CPAP - will arrange for replacement supplies   Obesity. - discussed options to assist with weight loss  HIV. - followed by Dr. Judyann Munson with [facility]  Time Spent Involved in Patient Care on Day of Examination:  27 minutes  Follow up:   Patient Instructions  Follow up in 1 year  Medication List:   Allergies as of 04/05/2023       Reactions   Lisinopril Swelling   Very swollen bottom lip        Medication List        Accurate as of Apr 05, 2023  9:59 AM. If you have any questions, ask your nurse or doctor.          Accu-Chek Guide test  strip Generic drug: glucose blood USE TO TEST BLOOD SUGAR FOUR TIMES DAILY AS DIRECTED   Accu-Chek Softclix Lancets lancets USE TO TEST BLOOD SUGAR FOUR TIMES DAILY AS DIRECTED   albuterol 108 (90 Base) MCG/ACT inhaler Commonly known as: VENTOLIN HFA Inhale 2 puffs into the lungs every 6 (six) hours as needed for wheezing or shortness of breath. Started by: Coralyn Helling, MD   amLODipine 10 MG tablet Commonly known as: NORVASC Take 1 tablet (10 mg total) by mouth daily. Needs appointment with PCP prior to next refill.   atorvastatin 40 MG tablet Commonly known as: LIPITOR TAKE ONE TABLET BY MOUTH DAILY - NEEDS APPT FOR FUTURE REFILLS-   Biktarvy 50-200-25 MG Tabs tablet Generic drug: bictegravir-emtricitabine-tenofovir AF Take 1 tablet by mouth daily.   blood glucose meter kit and supplies Kit Dispense based on patient and insurance preference. Use up to four times daily as directed.   Breztri Aerosphere 160-9-4.8 MCG/ACT Aero Generic drug: Budeson-Glycopyrrol-Formoterol Inhale 2 puffs into the lungs 2 (two) times daily. What changed: how much to take Changed by: Coralyn Helling, MD   cetirizine 10 MG tablet Commonly known as: ZYRTEC TAKE ONE TABLET BY MOUTH EVERY DAY   clindamycin 1 % external solution Commonly known as: CLEOCIN T APPLY TOPICALLY TWICE DAILY   furosemide 20 MG tablet Commonly known as: LASIX TAKE ONE TABLET BY MOUTH EVERY DAY -MAY TAKE EXTRA TABLET EVERY DAY AS NEEDED FOR WEIGHT GAIN OF 3 LBS OR MORE OVERNIGHT   Insulin Pen Needle 31G X 5 MM Misc 1 each by Does not apply route daily.   Lantus SoloStar 100 UNIT/ML Solostar Pen Generic drug: insulin glargine Inject 18 Units into the skin daily.   Naphcon-A 0.025-0.3 % ophthalmic solution Generic drug: naphazoline-pheniramine INSTILL 1 DROP INTO BOTH EYES FOUR TIMES A DAY AS NEEDED FOR IRRITATION What changed: See the new instructions.   Olopatadine HCl 0.2 % Soln Commonly known as: Pataday Apply 1  drop to eye daily as needed.   omeprazole 40 MG capsule Commonly known as: PRILOSEC TAKE ONE CAPSULE BY MOUTH EVERY DAY What changed:  when to take this reasons to take this   OXYGEN Inhale 2 L into the lungs at bedtime.   Restasis 0.05 % ophthalmic emulsion Generic drug: cycloSPORINE Place 1 drop into both eyes 2 (two) times daily.   spironolactone 25 MG tablet Commonly known as: ALDACTONE TAKE ONE-HALF TABLET BY MOUTH NIGHTLY AT BEDTIME   Tab-A-Vite Tabs TAKE ONE TABLET BY MOUTH EVERY DAY   Trulicity 3 MG/0.5ML Sopn Generic drug: Dulaglutide Inject 3 mg into the skin once a week.  Signature:  Coralyn Helling, MD [facility] Pager - [phone] 04/05/2023, 9:59 AM

## 2023-04-07 ENCOUNTER — Ambulatory Visit (INDEPENDENT_AMBULATORY_CARE_PROVIDER_SITE_OTHER): Payer: Medicaid Other | Admitting: Pharmacist

## 2023-04-07 ENCOUNTER — Telehealth: Payer: Self-pay

## 2023-04-07 ENCOUNTER — Other Ambulatory Visit: Payer: Self-pay | Admitting: Student

## 2023-04-07 VITALS — Wt 195.0 lb

## 2023-04-07 DIAGNOSIS — E782 Mixed hyperlipidemia: Secondary | ICD-10-CM

## 2023-04-07 DIAGNOSIS — E1165 Type 2 diabetes mellitus with hyperglycemia: Secondary | ICD-10-CM | POA: Diagnosis not present

## 2023-04-07 DIAGNOSIS — I5032 Chronic diastolic (congestive) heart failure: Secondary | ICD-10-CM

## 2023-04-07 NOTE — Assessment & Plan Note (Signed)
Diabetes longstanding and currently much improved control with GLP and insulin regimen. Patient is able to verbalize appropriate hypoglycemia management plan. Medication adherence appears optimal.  -Continued basal insulin Lantus (insulin glargine) at 18 units daily.  -Continued GLP-1 Trulicity (dulaglutide) at 3mg  weekly.  -Patient educated on purpose, proper use, and potential adverse effects. -Extensively discussed pathophysiology of diabetes, recommended lifestyle interventions, dietary effects on blood sugar control.  -Patient applied Libre 3 sensor without assistance.  Encouraged to reapply at next interval independently.  -Counseled on s/sx of and management of hypoglycemia.  -Next A1c anticipated 2 months.

## 2023-04-07 NOTE — Progress Notes (Signed)
S:     Chief Complaint  Patient presents with   Medication Management    CGM - Liberate Sensor Placement   57 y.o. female who presents for diabetes evaluation, education, and management.  PMH is has been enrolled in the Liberate CGM study and called this AM for a work-in appointment to have her sensor replaced (she is unable to keep her appointment for later in the week).  Brief visit was scheduled.   At last visit, patient requested assistance with sensor placement.     Today, patient arrives in good spirits and presents without any assistance.   Patient willing to place new sensor without assistance.    Current diabetes medications include: Insulin glargine 18 units daily and dulaglutide 3mg  weekly. Patient reports adherence to taking all medications as prescribed.    Patient denies symptoms with rare hypoglycemic readings on CGM.  O:   Review of Systems  All other systems reviewed and are negative.   Physical Exam Constitutional:      Appearance: Normal appearance.  Pulmonary:     Effort: Pulmonary effort is normal.  Neurological:     Mental Status: She is alert.  Psychiatric:        Mood and Affect: Mood normal.        Thought Content: Thought content normal.   CGM Download:  % Time CGM is active: 96% Average Glucose: 116 mg/dL Glucose Management Indicator: 6.1  Glucose Variability: 21.8% (goal <36%) Time in Goal:  - Time in range 70-180: 98% - Time above range: 1% - Time below range: 1% Observed patterns: Overall good control.    Lab Results  Component Value Date   HGBA1C 13.2 (A) 02/24/2023     Lipid Panel     Component Value Date/Time   CHOL 261 (H) 09/28/2022 1108   CHOL 156 03/13/2022 1321   TRIG 216 (H) 09/28/2022 1108   HDL 76 09/28/2022 1108   HDL 69 03/13/2022 1321   CHOLHDL 3.4 09/28/2022 1108   VLDL 28 03/30/2017 1555   LDLCALC 149 (H) 09/28/2022 1108   LDLDIRECT 144 (H) 01/06/2021 1514    Clinical Atherosclerotic  Cardiovascular Disease (ASCVD):  The 10-year ASCVD risk score (Arnett DK, et al., 2019) is: 7.7%   Values used to calculate the score:     Age: 65 years     Sex: Female     Is Non-Hispanic African American: Yes     Diabetic: Yes     Tobacco smoker: No     Systolic Blood Pressure: 116 mmHg     Is BP treated: Yes     HDL Cholesterol: 76 mg/dL     Total Cholesterol: 261 mg/dL   Patient is participating in a Managed Medicaid Plan:  Yes   A/P: Diabetes longstanding and currently much improved control with GLP and insulin regimen. Patient is able to verbalize appropriate hypoglycemia management plan. Medication adherence appears optimal.  -Continued basal insulin Lantus (insulin glargine) at 18 units daily.  -Continued GLP-1 Trulicity (dulaglutide) at 3mg  weekly.  -Patient educated on purpose, proper use, and potential adverse effects. -Extensively discussed pathophysiology of diabetes, recommended lifestyle interventions, dietary effects on blood sugar control.  -Patient applied Libre 3 sensor without assistance.  Encouraged to reapply at next interval independently.  -Counseled on s/sx of and management of hypoglycemia.  -Next A1c anticipated 2 months.   Written patient instructions provided. Patient verbalized understanding of treatment plan.  Total time in face to face counseling 14 minutes.  Follow-up:  Pharmacist 05/05/2023. Patient seen with Haze Boyden, PharmD Candidate and Bing Plume, PharmD Candidate.

## 2023-04-07 NOTE — Progress Notes (Signed)
Reviewed and agree with Dr Koval's plan.   

## 2023-04-07 NOTE — Telephone Encounter (Signed)
Contacted patient and plan to see her later this AM for sensor placement and CGM report review.

## 2023-04-07 NOTE — Telephone Encounter (Signed)
Patient calls nurse line stating that she needs to reschedule appointment for this Friday. She is going out of town tomorrow and will not be back until Monday.   She states that her sensor needs to be changed today. She is asking if Dr. Raymondo Band can see her today. Advised that we are closed in the afternoon and that there are no available appointments for today.   She states that she was told that she could be "squeezed in" for assistance with sensor.   Will forward to Dr. Raymondo Band for further assistance.   Veronda Prude, RN

## 2023-04-07 NOTE — Patient Instructions (Signed)
Continue all medications the same   I look forward to seeing you in about 1 month!

## 2023-04-07 NOTE — Telephone Encounter (Signed)
Reviewed and agree with Dr Koval's plan.   

## 2023-04-08 MED ORDER — ATORVASTATIN CALCIUM 40 MG PO TABS
ORAL_TABLET | ORAL | 3 refills | Status: DC
Start: 2023-04-08 — End: 2024-04-11

## 2023-04-09 ENCOUNTER — Ambulatory Visit: Payer: Medicaid Other | Admitting: Pharmacist

## 2023-04-14 ENCOUNTER — Ambulatory Visit: Payer: Medicaid Other

## 2023-04-17 ENCOUNTER — Other Ambulatory Visit: Payer: Self-pay | Admitting: Student

## 2023-04-17 DIAGNOSIS — B20 Human immunodeficiency virus [HIV] disease: Secondary | ICD-10-CM | POA: Diagnosis not present

## 2023-04-17 DIAGNOSIS — R062 Wheezing: Secondary | ICD-10-CM | POA: Diagnosis not present

## 2023-04-17 DIAGNOSIS — R0902 Hypoxemia: Secondary | ICD-10-CM | POA: Diagnosis not present

## 2023-04-17 DIAGNOSIS — G4733 Obstructive sleep apnea (adult) (pediatric): Secondary | ICD-10-CM | POA: Diagnosis not present

## 2023-04-22 ENCOUNTER — Telehealth: Payer: Self-pay | Admitting: Pulmonary Disease

## 2023-04-22 ENCOUNTER — Other Ambulatory Visit: Payer: Self-pay

## 2023-04-22 MED ORDER — BREZTRI AEROSPHERE 160-9-4.8 MCG/ACT IN AERO: 2.0000 | INHALATION_SPRAY | Freq: Two times a day (BID) | RESPIRATORY_TRACT | 5 refills | Status: DC

## 2023-04-22 NOTE — Telephone Encounter (Signed)
Spoke with patient. Advised per Dr. Sood she doesn't need both qvar and breztri since they contain the same medications except Breztri has more. She verbalized understanding. I have sent refills for Breztri. NFN 

## 2023-04-22 NOTE — Telephone Encounter (Signed)
Okay to send refill for breztri.  Please let her know that breztri has the same medicine as qvar, but breztri has more medicines.  She doesn't need to use qvar since she is using breztri.

## 2023-04-22 NOTE — Telephone Encounter (Signed)
Pt calliung in for refill for Budeson-Glycopyrrol-Formoterol (BREZTRI AEROSPHERE) 160-9-4.8 MCG/ACT AERO  & Pt states Qvar medicine as well but I didn't see it on med list  Pharmacy: [facility] [address] [place] - [address]

## 2023-04-22 NOTE — Telephone Encounter (Signed)
Spoke with patient. Advised per Dr. Craige Cotta she doesn't need both qvar and breztri since they contain the same medications except Markus Daft has more. She verbalized understanding. I have sent refills for Breztri. NFN

## 2023-04-22 NOTE — Telephone Encounter (Signed)
Dr. Craige Cotta can you please advise?  Did you want patient to be on Qvar and Breztri?

## 2023-04-24 IMAGING — DX DG CHEST 2V
2 series · 2 of 2 positions shown · non-contrast
Comparison: 04/12/2015

CLINICAL DATA: Cough.

EXAM:
CHEST - 2 VIEW

[chest pa]
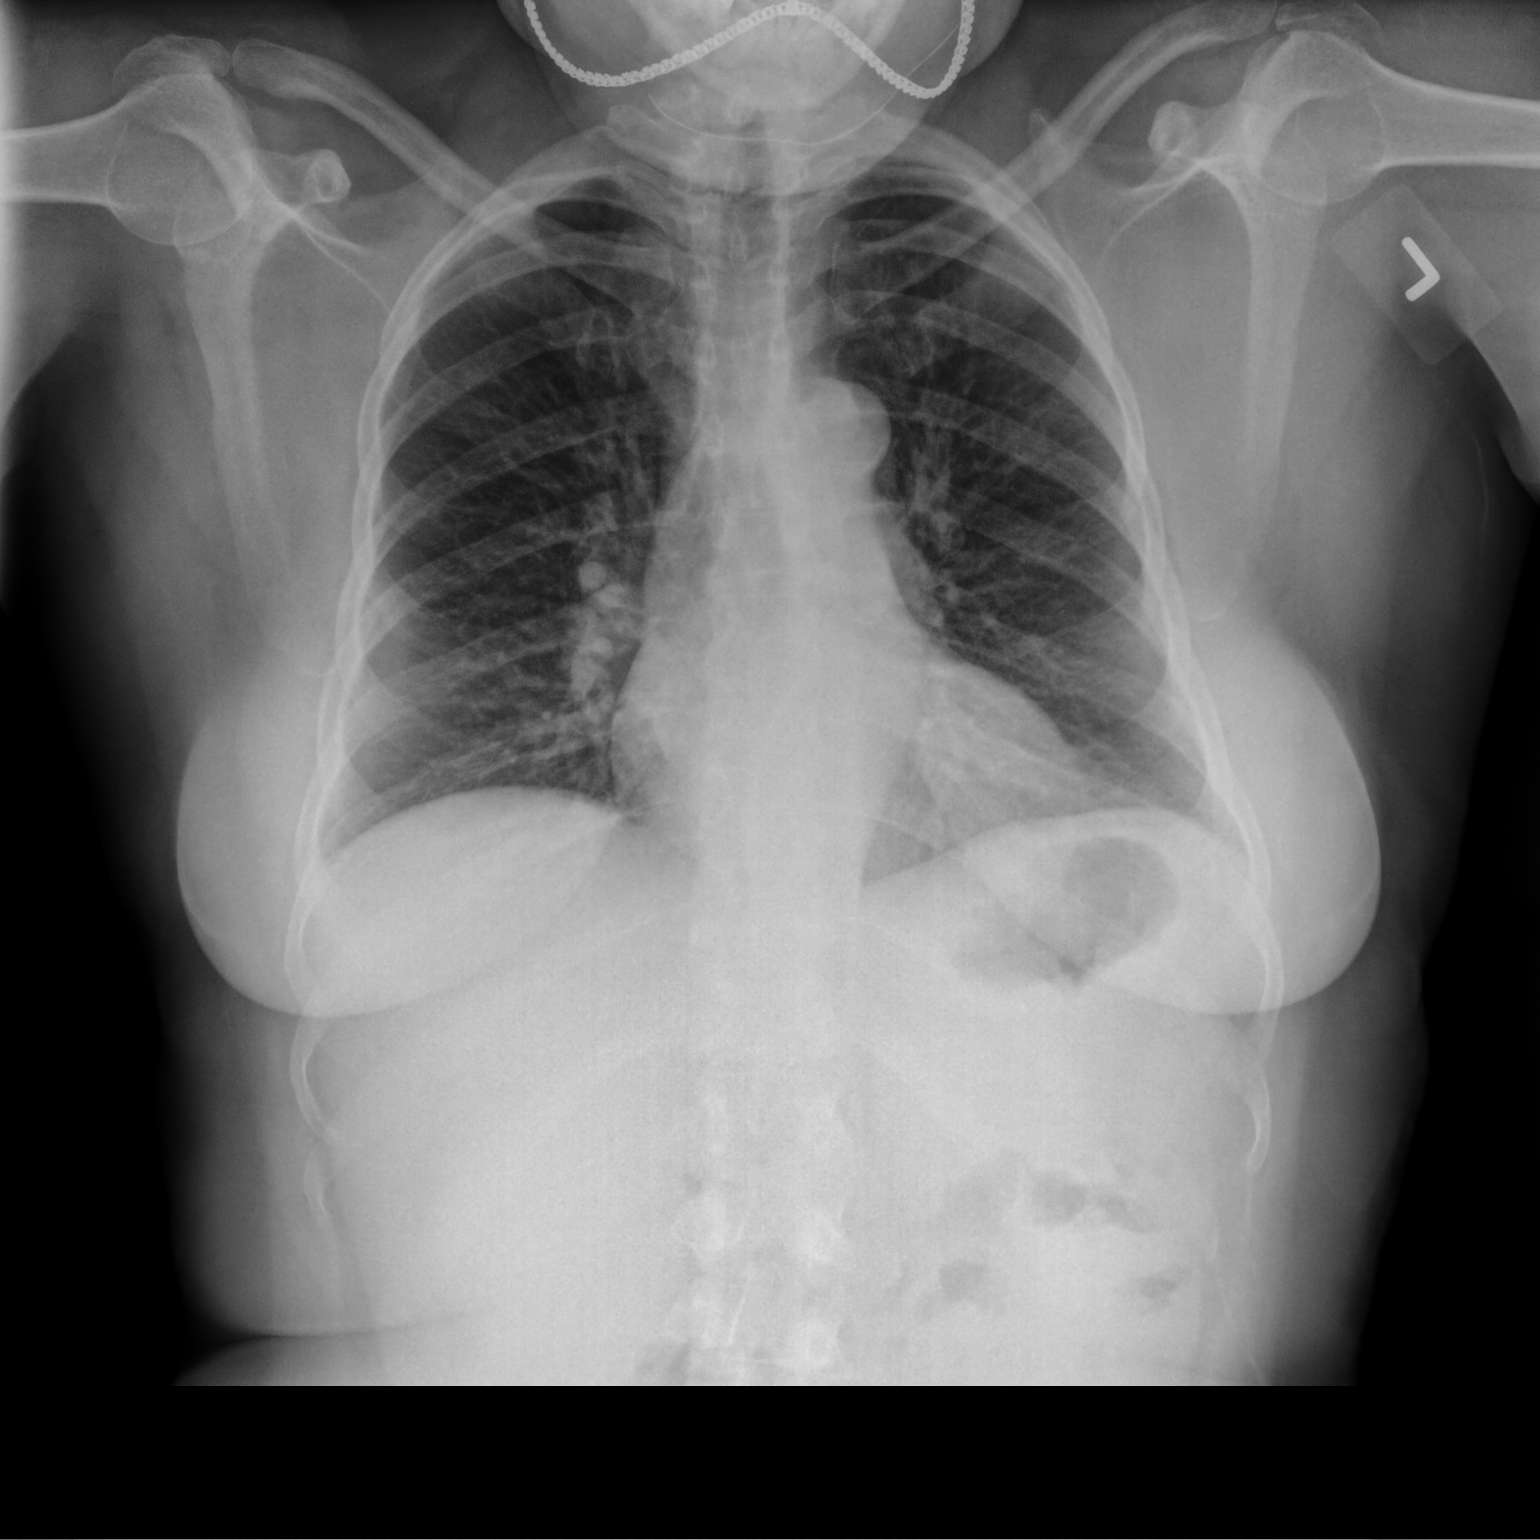

[chest lat]
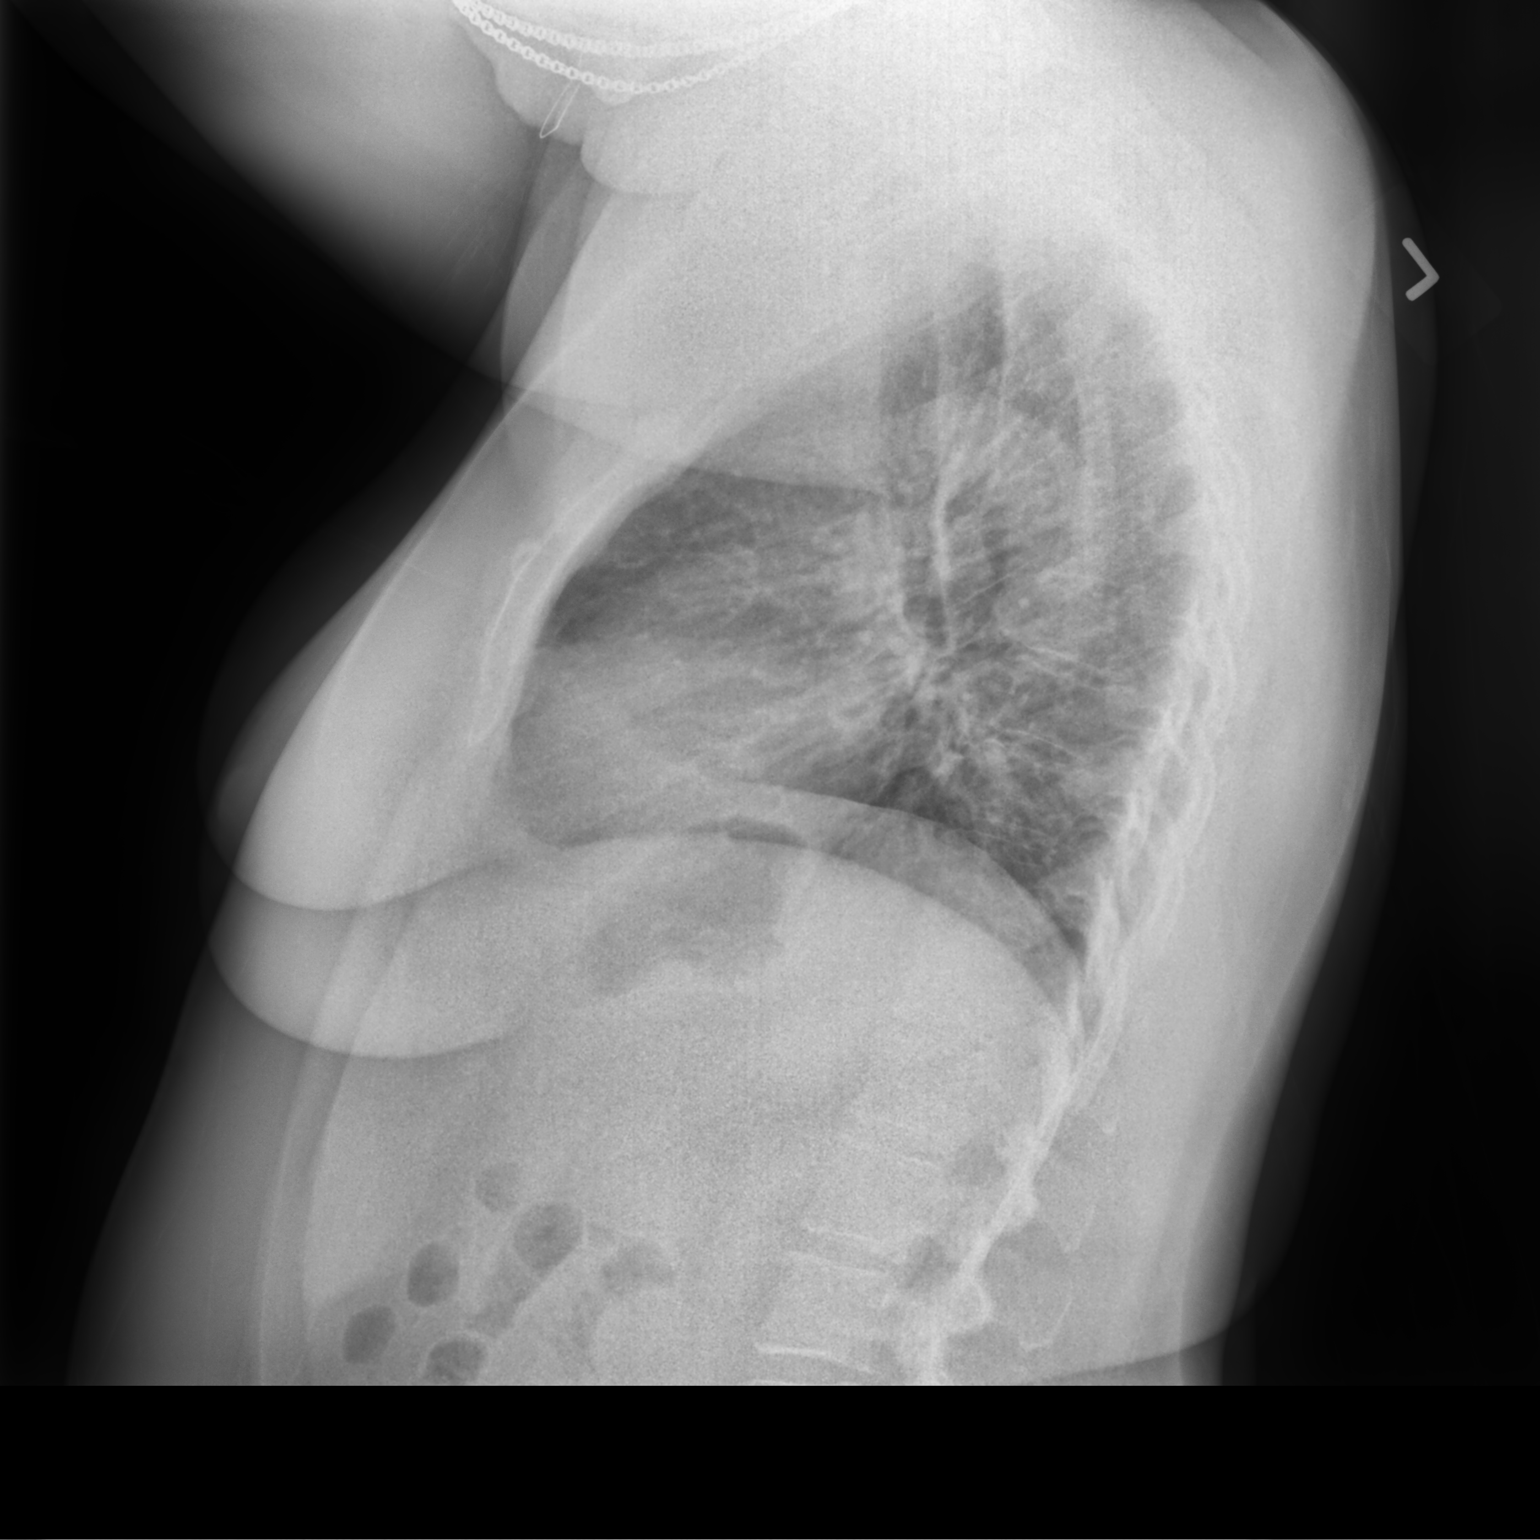

[2 of 2 positions shown; findings below may reference images not displayed]

FINDINGS: The heart size and mediastinal contours are within normal limits.
Aortic atherosclerotic calcification noted. Both lungs are clear.
The visualized skeletal structures are unremarkable.
IMPRESSION: No active cardiopulmonary disease.

## 2023-04-26 ENCOUNTER — Telehealth: Payer: Self-pay

## 2023-04-26 NOTE — Telephone Encounter (Signed)
*  Pulm  PA request received via CMM for Breztri Aerosphere 160-9-4.8MCG/ACT aerosol  PA submitted to [employer] and is pending additional questions/determination  *Symbicort was not effective  Key: [id number]

## 2023-04-29 ENCOUNTER — Ambulatory Visit (INDEPENDENT_AMBULATORY_CARE_PROVIDER_SITE_OTHER): Payer: Medicaid Other | Admitting: Student

## 2023-04-29 ENCOUNTER — Encounter: Payer: Self-pay | Admitting: Student

## 2023-04-29 VITALS — BP 153/97 | HR 81 | Ht 62.0 in | Wt 195.5 lb

## 2023-04-29 DIAGNOSIS — E11649 Type 2 diabetes mellitus with hypoglycemia without coma: Secondary | ICD-10-CM

## 2023-04-29 DIAGNOSIS — E1165 Type 2 diabetes mellitus with hyperglycemia: Secondary | ICD-10-CM | POA: Diagnosis present

## 2023-04-29 LAB — GLUCOSE, POCT (MANUAL RESULT ENTRY): POC Glucose: 103 mg/dl — AB (ref 70–99)

## 2023-04-29 NOTE — Assessment & Plan Note (Signed)
CGM Glu 68 >> CBG 103 in office. Took her 18 U of glargine today this morning. Trulicity once weekly. Reports suppressed appetite. Hypoglycemia v. CGM error-most likely HYPOglycemia in setting of poor p.o. intake, and use of insulin.  Asymptomatic after drinking juice prior to arrival in office. - Reduce Lantus to 10 U - Continue Trulicity - Counseled patient on eating, as needed juice/hard candy - Follow-up scheduled 6/19 in pharmacy clinic - Return precautions discussed

## 2023-04-29 NOTE — Progress Notes (Addendum)
    SUBJECTIVE:   CHIEF COMPLAINT / HPI:   Low Glucose  Noticed low sugars on CGM for past couple of days. Will eat something and get better. Typically low in the morning. Started Trulicity 4 months ago. Taking glargine in AM 18 U. Expereinced upset stomach with low sugar, but no other symptoms. Has not eaten today (current time 1435) suspect d/t Trulicity appetite suppression. CGM is in place and has 11 days left before needing to be changed.   OBJECTIVE:   BP (!) 153/97   Pulse 81   Ht 5\' 2"  (1.575 m)   Wt 195 lb 8 oz (88.7 kg)   LMP 12/05/2021   SpO2 97%   BMI 35.76 kg/m    General: NAD, pleasant HEENT: Normocephalic, atraumatic head.  Cardio: RRR, no MRG. Cap Refill <2s. Respiratory: CTAB, normal wob on RA GI: Abdomen is soft, not tender, not distended. BS present Skin: Warm and dry Neuro: Alert and oriented x4    ASSESSMENT/PLAN:   Hypoglycemic event in diabetes (HCC) CGM Glu 68 >> CBG 103 in office. Took her 18 U of glargine today this morning. Trulicity once weekly. Reports suppressed appetite. Hypoglycemia v. CGM error-most likely HYPOglycemia in setting of poor p.o. intake, and use of insulin.  Asymptomatic after drinking juice prior to arrival in office. - Reduce Lantus to 10 U - Continue Trulicity - Counseled patient on eating, as needed juice/hard candy - Follow-up scheduled 6/19 in pharmacy clinic - Return precautions discussed   Follow-up recommendations BP elevated, patient has not taken her BP medications today (Norvasc, aldactone). Recommend recheck at next visiti, consider additional agent (allergy to ACE) Due for UACR and foot exam.  Tiffany Kocher, DO [facility]

## 2023-04-29 NOTE — Patient Instructions (Addendum)
It was great to see you! Thank you for allowing me to participate in your care!   I recommend that you always bring your medications to each appointment as this makes it easy to ensure we are on the correct medications and helps [place] not miss when refills are needed.  Our plans for today:  - Decrease Lantus (glargine) to 10 Units once a day - continue Trulicity  - When you CGM shows a low blood sugar, or you feel symptoms of low blood sugar please check with your blood glucose monitor to confirm - Drink juice or eat a hard candy when your sugars are low - Go to appointment with Dr. Raymondo Band on 05/05/2023  Take care and seek immediate care sooner if you develop any concerns. Please remember to show up 15 minutes before your scheduled appointment time!  Tiffany Kocher, DO [facility]

## 2023-04-30 DIAGNOSIS — G4733 Obstructive sleep apnea (adult) (pediatric): Secondary | ICD-10-CM | POA: Diagnosis not present

## 2023-05-05 ENCOUNTER — Encounter: Payer: Self-pay | Admitting: Pharmacist

## 2023-05-05 ENCOUNTER — Ambulatory Visit (INDEPENDENT_AMBULATORY_CARE_PROVIDER_SITE_OTHER): Payer: Medicaid Other | Admitting: Pharmacist

## 2023-05-05 VITALS — BP 128/85 | HR 72 | Wt 197.8 lb

## 2023-05-05 DIAGNOSIS — E1165 Type 2 diabetes mellitus with hyperglycemia: Secondary | ICD-10-CM

## 2023-05-05 DIAGNOSIS — E1159 Type 2 diabetes mellitus with other circulatory complications: Secondary | ICD-10-CM

## 2023-05-05 DIAGNOSIS — I152 Hypertension secondary to endocrine disorders: Secondary | ICD-10-CM | POA: Diagnosis not present

## 2023-05-05 DIAGNOSIS — I110 Hypertensive heart disease with heart failure: Secondary | ICD-10-CM | POA: Diagnosis not present

## 2023-05-05 DIAGNOSIS — Z794 Long term (current) use of insulin: Secondary | ICD-10-CM | POA: Diagnosis not present

## 2023-05-05 DIAGNOSIS — B20 Human immunodeficiency virus [HIV] disease: Secondary | ICD-10-CM | POA: Diagnosis not present

## 2023-05-05 DIAGNOSIS — E785 Hyperlipidemia, unspecified: Secondary | ICD-10-CM | POA: Diagnosis not present

## 2023-05-05 DIAGNOSIS — I509 Heart failure, unspecified: Secondary | ICD-10-CM | POA: Diagnosis not present

## 2023-05-05 MED ORDER — LANTUS SOLOSTAR 100 UNIT/ML ~~LOC~~ SOPN
8.0000 [IU] | PEN_INJECTOR | Freq: Every day | SUBCUTANEOUS | 3 refills | Status: DC
Start: 2023-05-05 — End: 2024-08-25

## 2023-05-05 NOTE — Assessment & Plan Note (Signed)
Hypertension longstanding currently controlled. Blood pressure goal of <130/80 mmHg. Medication adherence optimal -Continued amlodipine 10 mg and spironolactone 12.5 mg daily

## 2023-05-05 NOTE — Progress Notes (Signed)
Reviewed and agree with Dr Koval's plan.   

## 2023-05-05 NOTE — Assessment & Plan Note (Signed)
Diabetes longstanding currently controlled. Patient is able to verbalize appropriate hypoglycemia management plan. Medication adherence appears appropriate. Inconsistent hypoglycemia seen on LibreView, even during the 2 days she was on the lower insulin dose of 10 units of Lantus daily. -Decreased dose of basal insulin Lantus (insulin glargine) from 18 units to 8 units daily -Continued GLP-1 Trulicity (dulaglutide) 3 mg weekly -Extensively discussed pathophysiology of diabetes, recommended lifestyle interventions, dietary effects on blood sugar control.  -Counseled on s/sx of and management of hypoglycemia.

## 2023-05-05 NOTE — Patient Instructions (Signed)
It was nice to see you today!  Your goal blood sugar is 80-130 before eating and less than 180 after eating.  Medication Changes: Continue Trulicity 3 mg weekly  Decrease Lantus to 8 units daily  Monitor blood sugars at home and keep a log (glucometer or piece of paper) to bring with you to your next visit.  Keep up the good work with diet and exercise. Aim for a diet full of vegetables, fruit and lean meats (chicken, [place], fish). Try to limit salt intake by eating fresh or frozen vegetables (instead of canned), rinse canned vegetables prior to cooking and do not add any additional salt to meals.

## 2023-05-05 NOTE — Progress Notes (Signed)
S:     Chief Complaint  Patient presents with   Medication Management    Diabetes Management   57 y.o. female who presents for diabetes evaluation, education, and management.  PMH is significant for HTN, T2DM, CHF, HLD and HIV.  Patient was referred and last seen by Primary Care Provider, Dr. Claudean Severance, on 04/29/23.   At last visit, her Lantus insulin dose was reduced to 10 units daily due to hypoglycemia readings on her LibreView report.   Today, patient arrives in good spirits and presents without any assistance. She states she took the lower insulin dose for 2 days, then returned to her initial dose of 18 units daily. She was hoping to clarify instructions at today's visit.   Current diabetes medications include: Trulicity 3 mg weekly, Lantus 10 units daily (taking 18 units daily) Current hypertension medications include: amlodipine 10 mg daily, spironolactone 12.5 mg daily Current hyperlipidemia medications include: atorvastatin 40 mg daily  Patient reports adherence to taking all medications as prescribed.   Insurance coverage: [place] Medicaid  Patient reported dietary habits: Eats 3 small meals/day Breakfast: boiled egg Lunch: salad or sandwich -Reports recent improvements in dietary habits   O:   Review of Systems  All other systems reviewed and are negative.   Physical Exam Constitutional:      Appearance: Normal appearance. She is normal weight.  Pulmonary:     Effort: Pulmonary effort is normal.     Breath sounds: Normal breath sounds.  Neurological:     Mental Status: She is alert.  Psychiatric:        Mood and Affect: Mood normal.        Thought Content: Thought content normal.     CGM Download:  % Time CGM is active: 68% Average Glucose: 116 mg/dL Glucose Management Indicator: 5.7%  Glucose Variability: 26.5% (goal <36%) Time in Goal:  - Time in range 70-180: 90% - Time above range: 2% - Time below range: 8% Observed patterns: periodic  hypoglycemia throughout the day   Lab Results  Component Value Date   HGBA1C 13.2 (A) 02/24/2023   Vitals:   05/05/23 0850  BP: 128/85  Pulse: 72  SpO2: 100%    Lipid Panel     Component Value Date/Time   CHOL 261 (H) 09/28/2022 1108   CHOL 156 03/13/2022 1321   TRIG 216 (H) 09/28/2022 1108   HDL 76 09/28/2022 1108   HDL 69 03/13/2022 1321   CHOLHDL 3.4 09/28/2022 1108   VLDL 28 03/30/2017 1555   LDLCALC 149 (H) 09/28/2022 1108   LDLDIRECT 144 (H) 01/06/2021 1514    Clinical Atherosclerotic Cardiovascular Disease (ASCVD): No  The 10-year ASCVD risk score (Arnett DK, et al., 2019) is: 10.7%   Values used to calculate the score:     Age: 32 years     Sex: Female     Is Non-Hispanic African American: Yes     Diabetic: Yes     Tobacco smoker: No     Systolic Blood Pressure: 128 mmHg     Is BP treated: Yes     HDL Cholesterol: 76 mg/dL     Total Cholesterol: 261 mg/dL   A/P: Diabetes longstanding currently controlled. Patient is able to verbalize appropriate hypoglycemia management plan. Medication adherence appears appropriate. Inconsistent hypoglycemia seen on LibreView, even during the 2 days she was on the lower insulin dose of 10 units of Lantus daily. -Decreased dose of basal insulin Lantus (insulin glargine) from 18 units  to 8 units daily -Continued GLP-1 Trulicity (dulaglutide) 3 mg weekly -Extensively discussed pathophysiology of diabetes, recommended lifestyle interventions, dietary effects on blood sugar control.  -Counseled on s/sx of and management of hypoglycemia.  -Next A1c anticipated 05/2023.   ASCVD risk - primary  prevention in patient with diabetes. Last LDL is 149 not at goal of <16 mg/dL. ASCVD risk factors include diabetes, HIV, HTN, and HLD.  -Continued atorvastatin 40 mg.   Hypertension longstanding currently controlled. Blood pressure goal of <130/80 mmHg. Medication adherence optimal -Continued amlodipine 10 mg and spironolactone 12.5 mg  daily  Written patient instructions provided. Patient verbalized understanding of treatment plan.  Total time in face to face counseling 17 minutes.    Follow-up:  Pharmacist 05/26/23 for 3 month Liberate follow up Patient seen with Lily Peer, PharmD, PGY-1 resident.

## 2023-05-07 NOTE — Telephone Encounter (Signed)
Per your health plan's criteria, this drug is covered if you meet the following: One of the following: (1) You have failed one preferred drug as confirmed by claims history or submission of medical records:. The preferred drugs: Advair Diskus, Advair HFA, and Dulera. (2) You cannot use one preferred drug (please specify contraindication or intolerance). The information provided does not show that you meet the criteria listed above. Please speak with your doctor about your choices.

## 2023-05-07 NOTE — Telephone Encounter (Signed)
Please let her know her insurance won't cover breztri.  Please send script for dulera 100 two puffs bid and spiriva respimat 2.5 mcg two puffs daily.

## 2023-05-07 NOTE — Telephone Encounter (Signed)
Dr. Craige Cotta please advise. PA for Leslie Gates has been denied patient will need to try one of the preferred medications below  Advair Diskus, Advair HFA, and Dulera.

## 2023-05-07 NOTE — Telephone Encounter (Signed)
Tried calling the pt x 2 - line rings and there is no VM set up yet Will try back

## 2023-05-13 ENCOUNTER — Encounter: Payer: Self-pay | Admitting: *Deleted

## 2023-05-13 NOTE — Telephone Encounter (Signed)
Tried calling the pt again  Still no answer and unable to leave msg  Letter mailed

## 2023-05-17 DIAGNOSIS — B20 Human immunodeficiency virus [HIV] disease: Secondary | ICD-10-CM | POA: Diagnosis not present

## 2023-05-17 DIAGNOSIS — R062 Wheezing: Secondary | ICD-10-CM | POA: Diagnosis not present

## 2023-05-17 DIAGNOSIS — G4733 Obstructive sleep apnea (adult) (pediatric): Secondary | ICD-10-CM | POA: Diagnosis not present

## 2023-05-17 DIAGNOSIS — R0902 Hypoxemia: Secondary | ICD-10-CM | POA: Diagnosis not present

## 2023-05-18 ENCOUNTER — Other Ambulatory Visit: Payer: Self-pay | Admitting: Internal Medicine

## 2023-05-26 ENCOUNTER — Encounter (INDEPENDENT_AMBULATORY_CARE_PROVIDER_SITE_OTHER): Payer: Medicaid Other | Admitting: Pharmacist

## 2023-05-26 ENCOUNTER — Encounter: Payer: Self-pay | Admitting: Pharmacist

## 2023-05-26 VITALS — BP 132/84 | HR 72 | Wt 198.4 lb

## 2023-05-26 DIAGNOSIS — E1140 Type 2 diabetes mellitus with diabetic neuropathy, unspecified: Secondary | ICD-10-CM

## 2023-05-26 LAB — POCT GLYCOSYLATED HEMOGLOBIN (HGB A1C): HbA1c, POC (controlled diabetic range): 6.9 % (ref 0.0–7.0)

## 2023-05-26 NOTE — Research (Signed)
S:     Chief Complaint  Patient presents with   Medication Management    Liberate 3 month    57 y.o. female who presents for diabetes evaluation, education, and management in the context of the LIBERATE Study.  PMH is significant for T2DM and heart failure history.  Patient was referred and last seen by Primary Care Provider, Dr. Claudean Severance, on 04/29/2023.    Today, patient arrives in good spirits and presents without any assistance.   Patient reports Diabetes was diagnosed in 2021.    Current diabetes medications include: Trulicity (dulaglutide) 3 mg weekly Current hypertension medications include: amlodipine 10 mg daily, spironolactone 12.5 mg daily   Patient reports adherence to taking all medications as prescribed.    Do you feel that your medications are working for you? Yes  Have you been experiencing any side effects to the medications prescribed? no Do you have any problems obtaining medications due to transportation or finances? no Insurance coverage: [place] Medicaid   Patient denies hypoglycemic events.   Patient reports nocturia (nighttime urination). Less than previous    Within the past 12 months, did you worry whether your food would run out before you got money to buy more? no Within the past 12 months, did the food you bought run out, and you didn't have money to get more? no    O:   Review of Systems  All other systems reviewed and are negative.   Physical Exam Constitutional:      Appearance: Normal appearance.  Pulmonary:     Effort: Pulmonary effort is normal.  Neurological:     Mental Status: She is alert.  Psychiatric:        Mood and Affect: Mood normal.        Thought Content: Thought content normal.        Judgment: Judgment normal.      Lab Results  Component Value Date   HGBA1C 6.9 05/26/2023    POC A1c Today is much improved since initial Liberate enrollment A1c of 13  Vitals:   05/26/23 0920  BP: 132/84  Pulse: 72  SpO2:  100%     Lipid Panel     Component Value Date/Time   CHOL 261 (H) 09/28/2022 1108   CHOL 156 03/13/2022 1321   TRIG 216 (H) 09/28/2022 1108   HDL 76 09/28/2022 1108   HDL 69 03/13/2022 1321   CHOLHDL 3.4 09/28/2022 1108   VLDL 28 03/30/2017 1555   LDLCALC 149 (H) 09/28/2022 1108   LDLDIRECT 144 (H) 01/06/2021 1514    Clinical Atherosclerotic Cardiovascular Disease (ASCVD):  The 10-year ASCVD risk score (Arnett DK, et al., 2019) is: 11.8%   Values used to calculate the score:     Age: 38 years     Sex: Female     Is Non-Hispanic African American: Yes     Diabetic: Yes     Tobacco smoker: No     Systolic Blood Pressure: 132 mmHg     Is BP treated: Yes     HDL Cholesterol: 76 mg/dL     Total Cholesterol: 261 mg/dL   Patient is participating in a Managed Medicaid Plan:  Yes     A/P:  LIBERATE Study:  - 8 sensors provided for a 3 month supply. Educated to contact the office if the sensor falls off early and replacements are needed before their next study appointment.   Diabetes longstanding and currently greatly improved A1c, Now 6.9, since staring  Liberate study. Patient is  able to verbalize appropriate hypoglycemia management plan. Medication adherence appears good. Control is excellent. -Continued basal insulin Lantus (insulin glargine) at 8 units daily.  -Continued GLP-1 Trulicity (dulaglutide) at 3mg  weekly.  -Patient educated on purpose, proper use, and potential adverse effects.  -Extensively discussed pathophysiology of diabetes, recommended lifestyle interventions, dietary effects on blood sugar control.  -Counseled on s/sx of and management of hypoglycemia.  -Next A1c anticipated 3 months at the 6 month Liberate study visit.  - Patient completed DDS today.   Hypertension - at goal with today's reading No change - continue amlodipine and spironolactone.    Written patient instructions provided. Patient verbalized understanding of treatment plan.  Total time  in face to face counseling 27 minutes.    Follow-up:  Pharmacist in 3 months or sooner if needed.  PCP clinic visit in PRN.

## 2023-05-26 NOTE — Progress Notes (Signed)
Reviewed and agree with Dr Koval's plan.   

## 2023-05-26 NOTE — Assessment & Plan Note (Signed)
Diabetes longstanding and currently greatly improved A1c, Now 6.9, since staring Liberate study. Patient is  able to verbalize appropriate hypoglycemia management plan. Medication adherence appears good. Control is excellent. -Continued basal insulin Lantus (insulin glargine) at 8 units daily.  -Continued GLP-1 Trulicity (dulaglutide) at 3mg  weekly.  -Patient educated on purpose, proper use, and potential adverse effects.  -Extensively discussed pathophysiology of diabetes, recommended lifestyle interventions, dietary effects on blood sugar control.  -Counseled on s/sx of and management of hypoglycemia.  -Next A1c anticipated 3 months at the 6 month Liberate study visit.  - Patient completed DDS today.

## 2023-05-26 NOTE — Patient Instructions (Signed)
It was nice to see you today!    Your goal blood sugar is 80-130 before eating and less than 180 after eating.  Medication Changes: Continue all medications the same.  No changes at this time.   Next visit in 3 months (Early October - please call and schedule that appointment in the next few weeks)  Keep up the good work with diet and exercise. Aim for a diet full of vegetables, fruit and lean meats (chicken, [place], fish). Try to limit salt intake by eating fresh or frozen vegetables (instead of canned), rinse canned vegetables prior to cooking and do not add any additional salt to meals.

## 2023-06-08 ENCOUNTER — Other Ambulatory Visit: Payer: Self-pay | Admitting: Family Medicine

## 2023-06-10 ENCOUNTER — Other Ambulatory Visit: Payer: Self-pay | Admitting: Obstetrics

## 2023-06-10 ENCOUNTER — Other Ambulatory Visit: Payer: Self-pay | Admitting: Student

## 2023-06-10 DIAGNOSIS — L739 Follicular disorder, unspecified: Secondary | ICD-10-CM

## 2023-06-10 DIAGNOSIS — I5032 Chronic diastolic (congestive) heart failure: Secondary | ICD-10-CM

## 2023-06-16 ENCOUNTER — Other Ambulatory Visit: Payer: Self-pay | Admitting: Internal Medicine

## 2023-06-16 DIAGNOSIS — B20 Human immunodeficiency virus [HIV] disease: Secondary | ICD-10-CM

## 2023-06-17 DIAGNOSIS — R0902 Hypoxemia: Secondary | ICD-10-CM | POA: Diagnosis not present

## 2023-06-17 DIAGNOSIS — G4733 Obstructive sleep apnea (adult) (pediatric): Secondary | ICD-10-CM | POA: Diagnosis not present

## 2023-06-17 DIAGNOSIS — B20 Human immunodeficiency virus [HIV] disease: Secondary | ICD-10-CM | POA: Diagnosis not present

## 2023-06-17 DIAGNOSIS — R062 Wheezing: Secondary | ICD-10-CM | POA: Diagnosis not present

## 2023-07-04 ENCOUNTER — Other Ambulatory Visit: Payer: Self-pay | Admitting: Student

## 2023-07-09 ENCOUNTER — Other Ambulatory Visit: Payer: Self-pay | Admitting: Student

## 2023-07-09 DIAGNOSIS — J302 Other seasonal allergic rhinitis: Secondary | ICD-10-CM

## 2023-07-14 ENCOUNTER — Other Ambulatory Visit: Payer: Self-pay | Admitting: Internal Medicine

## 2023-07-14 DIAGNOSIS — B20 Human immunodeficiency virus [HIV] disease: Secondary | ICD-10-CM

## 2023-07-18 DIAGNOSIS — G4733 Obstructive sleep apnea (adult) (pediatric): Secondary | ICD-10-CM | POA: Diagnosis not present

## 2023-07-18 DIAGNOSIS — R062 Wheezing: Secondary | ICD-10-CM | POA: Diagnosis not present

## 2023-07-18 DIAGNOSIS — B20 Human immunodeficiency virus [HIV] disease: Secondary | ICD-10-CM | POA: Diagnosis not present

## 2023-07-18 DIAGNOSIS — R0902 Hypoxemia: Secondary | ICD-10-CM | POA: Diagnosis not present

## 2023-07-20 ENCOUNTER — Other Ambulatory Visit: Payer: Self-pay | Admitting: Student

## 2023-07-20 ENCOUNTER — Other Ambulatory Visit: Payer: Self-pay | Admitting: Family Medicine

## 2023-07-20 DIAGNOSIS — I152 Hypertension secondary to endocrine disorders: Secondary | ICD-10-CM

## 2023-07-20 DIAGNOSIS — I10 Essential (primary) hypertension: Secondary | ICD-10-CM

## 2023-08-12 ENCOUNTER — Other Ambulatory Visit: Payer: Self-pay | Admitting: Student

## 2023-08-12 DIAGNOSIS — I5032 Chronic diastolic (congestive) heart failure: Secondary | ICD-10-CM

## 2023-08-16 ENCOUNTER — Other Ambulatory Visit: Payer: Self-pay

## 2023-08-16 ENCOUNTER — Other Ambulatory Visit: Payer: Medicaid Other

## 2023-08-16 DIAGNOSIS — B20 Human immunodeficiency virus [HIV] disease: Secondary | ICD-10-CM

## 2023-08-16 DIAGNOSIS — Z79899 Other long term (current) drug therapy: Secondary | ICD-10-CM

## 2023-08-16 DIAGNOSIS — Z113 Encounter for screening for infections with a predominantly sexual mode of transmission: Secondary | ICD-10-CM

## 2023-08-17 DIAGNOSIS — G4733 Obstructive sleep apnea (adult) (pediatric): Secondary | ICD-10-CM | POA: Diagnosis not present

## 2023-08-17 DIAGNOSIS — R062 Wheezing: Secondary | ICD-10-CM | POA: Diagnosis not present

## 2023-08-17 DIAGNOSIS — R0902 Hypoxemia: Secondary | ICD-10-CM | POA: Diagnosis not present

## 2023-08-17 DIAGNOSIS — B20 Human immunodeficiency virus [HIV] disease: Secondary | ICD-10-CM | POA: Diagnosis not present

## 2023-08-30 ENCOUNTER — Ambulatory Visit: Payer: Medicaid Other | Admitting: Internal Medicine

## 2023-08-31 ENCOUNTER — Other Ambulatory Visit: Payer: Self-pay

## 2023-08-31 ENCOUNTER — Other Ambulatory Visit (HOSPITAL_COMMUNITY)
Admission: RE | Admit: 2023-08-31 | Discharge: 2023-08-31 | Disposition: A | Discharge status: Home / Self Care | Payer: Medicaid Other | Source: Ambulatory Visit | Attending: Internal Medicine | Admitting: Internal Medicine

## 2023-08-31 ENCOUNTER — Other Ambulatory Visit: Payer: Medicaid Other

## 2023-08-31 DIAGNOSIS — Z113 Encounter for screening for infections with a predominantly sexual mode of transmission: Secondary | ICD-10-CM

## 2023-08-31 DIAGNOSIS — B20 Human immunodeficiency virus [HIV] disease: Secondary | ICD-10-CM | POA: Insufficient documentation

## 2023-08-31 DIAGNOSIS — Z79899 Other long term (current) drug therapy: Secondary | ICD-10-CM | POA: Diagnosis not present

## 2023-09-01 LAB — T-HELPER CELL (CD4) - (RCID CLINIC ONLY)
CD4 % Helper T Cell: 30 % — ABNORMAL LOW (ref 33–65)
CD4 T Cell Abs: 584 /uL (ref 400–1790)

## 2023-09-01 LAB — URINE CYTOLOGY ANCILLARY ONLY
Chlamydia: NEGATIVE
Comment: NEGATIVE
Comment: NORMAL
Neisseria Gonorrhea: NEGATIVE

## 2023-09-02 ENCOUNTER — Telehealth: Payer: Self-pay

## 2023-09-02 ENCOUNTER — Ambulatory Visit: Payer: Medicaid Other | Admitting: Student

## 2023-09-02 NOTE — Telephone Encounter (Signed)
Pharmacy Patient Advocate Encounter   Received notification from CoverMyMeds that prior authorization for ACCU CHECK GUIDE STRIPS is required/requested.   PA required; PA submitted to [employer] via CoverMyMeds Key/confirmation #/EOC [id number]. Status is pending

## 2023-09-03 LAB — COMPLETE METABOLIC PANEL WITH GFR
AG Ratio: 1.3 (calc) (ref 1.0–2.5)
ALT: 18 U/L (ref 6–29)
AST: 18 U/L (ref 10–35)
Albumin: 4.4 g/dL (ref 3.6–5.1)
Alkaline phosphatase (APISO): 110 U/L (ref 37–153)
BUN: 13 mg/dL (ref 7–25)
CO2: 32 mmol/L (ref 20–32)
Calcium: 10.3 mg/dL (ref 8.6–10.4)
Chloride: 102 mmol/L (ref 98–110)
Creat: 0.71 mg/dL (ref 0.50–1.03)
Globulin: 3.5 g/dL (ref 1.9–3.7)
Glucose, Bld: 194 mg/dL — ABNORMAL HIGH (ref 65–99)
Potassium: 4.4 mmol/L (ref 3.5–5.3)
Sodium: 141 mmol/L (ref 135–146)
Total Bilirubin: 0.4 mg/dL (ref 0.2–1.2)
Total Protein: 7.9 g/dL (ref 6.1–8.1)
eGFR: 100 mL/min/{1.73_m2} (ref >=60)

## 2023-09-03 LAB — LIPID PANEL
Cholesterol: 233 mg/dL — ABNORMAL HIGH (ref <200)
HDL: 74 mg/dL (ref >=50)
LDL Cholesterol (Calc): 136 mg/dL — ABNORMAL HIGH
Non-HDL Cholesterol (Calc): 159 mg/dL — ABNORMAL HIGH (ref <130)
Total CHOL/HDL Ratio: 3.1 (calc) (ref <5.0)
Triglycerides: 122 mg/dL (ref <150)

## 2023-09-03 LAB — CBC WITH DIFFERENTIAL/PLATELET
Absolute Lymphocytes: 2479 {cells}/uL (ref 850–3900)
Absolute Monocytes: 289 {cells}/uL (ref 200–950)
Basophils Absolute: 69 {cells}/uL (ref 0–200)
Basophils Relative: 1.4 %
Eosinophils Absolute: 181 {cells}/uL (ref 15–500)
Eosinophils Relative: 3.7 %
HCT: 41.8 % (ref 35.0–45.0)
Hemoglobin: 13.6 g/dL (ref 11.7–15.5)
MCH: 29.1 pg (ref 27.0–33.0)
MCHC: 32.5 g/dL (ref 32.0–36.0)
MCV: 89.5 fL (ref 80.0–100.0)
MPV: 10.2 fL (ref 7.5–12.5)
Monocytes Relative: 5.9 %
Neutro Abs: 1882 {cells}/uL (ref 1500–7800)
Neutrophils Relative %: 38.4 %
Platelets: 347 10*3/uL (ref 140–400)
RBC: 4.67 10*6/uL (ref 3.80–5.10)
RDW: 12.8 % (ref 11.0–15.0)
Total Lymphocyte: 50.6 %
WBC: 4.9 10*3/uL (ref 3.8–10.8)

## 2023-09-03 LAB — RPR: RPR Ser Ql: NONREACTIVE

## 2023-09-03 LAB — HIV-1 RNA QUANT-NO REFLEX-BLD
HIV 1 RNA Quant: <20 {copies}/mL — ABNORMAL HIGH
HIV-1 RNA Quant, Log: <1.3 {Log} — ABNORMAL HIGH

## 2023-09-09 ENCOUNTER — Other Ambulatory Visit (HOSPITAL_COMMUNITY): Payer: Self-pay

## 2023-09-09 MED ORDER — ACCU-CHEK AVIVA PLUS VI STRP: ORAL_STRIP | 12 refills | Status: DC

## 2023-09-09 NOTE — Addendum Note (Signed)
Addended by: Tiffany Kocher on: 09/09/2023 10:12 AM   Modules accepted: Orders

## 2023-09-09 NOTE — Telephone Encounter (Signed)
Pharmacy Patient Advocate Encounter  Received notification from [facility] MEDICAID that Prior Authorization for ACCU CHEK GUIDE STRIPS has been DENIED.  INSURANCE COVERS ACCU CHEK AVIVA PLUS STRIPS.  Could accu-chek aviva plus strips be sent instead?  PA #/Case ID/Reference #: [id number]

## 2023-09-09 NOTE — Telephone Encounter (Signed)
ACCU CHEK AVIVA PLUS STRIPS ordered for patient.

## 2023-09-17 DIAGNOSIS — G4733 Obstructive sleep apnea (adult) (pediatric): Secondary | ICD-10-CM | POA: Diagnosis not present

## 2023-09-17 DIAGNOSIS — R062 Wheezing: Secondary | ICD-10-CM | POA: Diagnosis not present

## 2023-09-17 DIAGNOSIS — B20 Human immunodeficiency virus [HIV] disease: Secondary | ICD-10-CM | POA: Diagnosis not present

## 2023-09-17 DIAGNOSIS — R0902 Hypoxemia: Secondary | ICD-10-CM | POA: Diagnosis not present

## 2023-09-20 ENCOUNTER — Ambulatory Visit: Payer: Medicaid Other | Admitting: Internal Medicine

## 2023-09-20 ENCOUNTER — Other Ambulatory Visit: Payer: Self-pay

## 2023-09-20 ENCOUNTER — Encounter: Payer: Self-pay | Admitting: Internal Medicine

## 2023-09-20 VITALS — BP 126/85 | HR 74 | Temp 97.8°F | Ht 63.0 in | Wt 198.0 lb

## 2023-09-20 DIAGNOSIS — Z0000 Encounter for general adult medical examination without abnormal findings: Secondary | ICD-10-CM

## 2023-09-20 DIAGNOSIS — B20 Human immunodeficiency virus [HIV] disease: Secondary | ICD-10-CM | POA: Diagnosis not present

## 2023-09-20 NOTE — Progress Notes (Signed)
RFV: Follow up for HIV disease  Patient ID: Leslie Gates, female   DOB: 06-Nov-1966, 57 y.o.   MRN: [id number]  HPI Leslie Gates is a 57yo F with well controlled hiv disease on biktarvy. Cd 4 count of 584/VL<20 in oct 2024. Also had HTN, DM  Weight loss from trulicity;still learning to adapt for DM management. Doesn't feel much different with weight difference. Needs to be mindful of intact.  Working at [employer] part-time serving   Outpatient Encounter Medications as of 09/20/2023  Medication Sig   Accu-Chek Softclix Lancets lancets USE TO TEST BLOOD SUGAR FOUR TIMES DAILY AS DIRECTED   amLODipine (NORVASC) 10 MG tablet TAKE ONE TABLET BY MOUTH DAILY (NEEDS OFFICE VISIT FOR FURTHER REFILLS)   atorvastatin (LIPITOR) 40 MG tablet TAKE ONE TABLET BY MOUTH DAILY - NEEDS APPT FOR FUTURE REFILLS-   bictegravir-emtricitabine-tenofovir AF (BIKTARVY) 50-200-25 MG TABS tablet TAKE ONE TABLET BY MOUTH DAILY (NEEDS OFFICE VISIT FOR FURTHER REFILLS)   blood glucose meter kit and supplies KIT Dispense based on patient and insurance preference. Use up to four times daily as directed.   cetirizine (ZYRTEC) 10 MG tablet TAKE ONE TABLET BY MOUTH EVERY DAY   clindamycin (CLEOCIN T) 1 % external solution APPLY TO THE AFFECTED AREA(S) TWICE DAILY   furosemide (LASIX) 20 MG tablet TAKE ONE TABLET BY MOUTH EVERY DAY -MAY TAKE EXTRA TABLET EVERY DAY AS NEEDED FOR WEIGHT GAIN OF 3 LBS OR MORE OVERNIGHT   glucose blood (ACCU-CHEK AVIVA PLUS) test strip Use as instructed   insulin glargine (LANTUS SOLOSTAR) 100 UNIT/ML Solostar Pen Inject 8 Units into the skin daily.   Insulin Pen Needle 31G X 5 MM MISC 1 each by Does not apply route daily.   Multiple Vitamin (TAB-A-VITE) TABS TAKE ONE TABLET BY MOUTH EVERY DAY   NAPHCON-A 0.025-0.3 % ophthalmic solution INSTILL 1 DROP INTO BOTH EYES FOUR TIMES A DAY AS NEEDED FOR IRRITATION (Patient taking differently: Place 1 drop into both eyes 4 (four) times daily as needed for eye  irritation.)   Olopatadine HCl (PATADAY) 0.2 % SOLN Apply 1 drop to eye daily as needed.   omeprazole (PRILOSEC) 40 MG capsule TAKE ONE CAPSULE BY MOUTH EVERY DAY (Patient taking differently: Take 40 mg by mouth daily as needed (GERD).)   OXYGEN Inhale 2 L into the lungs at bedtime.   RESTASIS 0.05 % ophthalmic emulsion Place 1 drop into both eyes 2 (two) times daily.   spironolactone (ALDACTONE) 25 MG tablet TAKE ONE-HALF TABLET BY MOUTH NIGHTLY AT BEDTIME   TRULICITY 3 MG/0.5ML SOPN INJECT ONE PEN (THREE MG) SUBCUTANEOUSLY ONCE A WEEK   VENTOLIN HFA 108 (90 Base) MCG/ACT inhaler INHALE TWO PUFFS INTO THE LUNGS EVERY 6 HOURS FOR WHEEZING OR SHORTNESS OF BREATH   No facility-administered encounter medications on file as of 09/20/2023.     Patient Active Problem List   Diagnosis Date Noted   Hypoglycemic event in diabetes (HCC) 04/29/2023   Seasonal allergies 08/10/2022   Healthcare maintenance 04/10/2022   Type 2 diabetes mellitus with diabetic neuropathy, unspecified (HCC) 03/13/2022   Dysplasia of cervix, low grade (CIN 1) 11/13/2020   Type 2 diabetes mellitus with hyperglycemia (HCC) 12/15/2019   Hyperlipidemia associated with type 2 diabetes mellitus (HCC) 04/29/2018   Chronic diastolic heart failure (HCC) 06/25/2017   Moderate dysplasia of cervix 07/10/2013   History of angioedema 09/09/2011   Obesity hypoventilation syndrome (HCC) 07/10/2010   Obesity, Class I, BMI 30-34.9 07/01/2010   Obstructive sleep apnea  06/27/2010   Pulmonary hypertension (HCC) 05/28/2010   Hypertension associated with type 2 diabetes mellitus (HCC) 03/22/2007   HIV disease (HCC) 06/21/2002     Health Maintenance Due  Topic Date Due   Zoster Vaccines- Shingrix (1 of 2) Never done   COVID-19 Vaccine (3 - Pfizer risk series) 04/24/2020   Cervical Cancer Screening (Pap smear)  11/13/2021   OPHTHALMOLOGY EXAM  12/20/2021   Diabetic kidney evaluation - Urine ACR  03/14/2023   FOOT EXAM  03/14/2023    MAMMOGRAM  03/27/2023   INFLUENZA VACCINE  06/17/2023     Review of Systems  Constitutional: Negative for fever, chills, diaphoresis, activity change, appetite change, fatigue and unexpected weight change.  HENT: Negative for congestion, sore throat, rhinorrhea, sneezing, trouble swallowing and sinus pressure.  Eyes: Negative for photophobia and visual disturbance.  Respiratory: Negative for cough, chest tightness, shortness of breath, wheezing and stridor.  Cardiovascular: Negative for chest pain, palpitations and leg swelling.  Gastrointestinal: Negative for nausea, vomiting, abdominal pain, diarrhea, constipation, blood in stool, abdominal distention and anal bleeding.  Genitourinary: Negative for dysuria, hematuria, flank pain and difficulty urinating.  Musculoskeletal: Negative for myalgias, back pain, joint swelling, arthralgias and gait problem.  Skin: Negative for color change, pallor, rash and wound.  Neurological: Negative for dizziness, tremors, weakness and light-headedness.  Hematological: Negative for adenopathy. Does not bruise/bleed easily.  Psychiatric/Behavioral: Negative for behavioral problems, confusion, sleep disturbance, dysphoric mood, decreased concentration and agitation.   Physical Exam   LMP 12/05/2021   Ht 5\' 3"  (1.6 m)   Wt 198 lb (89.8 kg)   LMP 12/05/2021   BMI 35.07 kg/m  Physical Exam  Constitutional:  oriented to person, place, and time. appears well-developed and well-nourished. No distress.  HENT: [place]/AT, PERRLA, no scleral icterus Mouth/Throat: Oropharynx is clear and moist. No oropharyngeal exudate.  Cardiovascular: Normal rate, regular rhythm and normal heart sounds. Exam reveals no gallop and no friction rub.  No murmur heard.  Pulmonary/Chest: Effort normal and breath sounds normal. No respiratory distress.  has no wheezes.  Neck = supple, no nuchal rigidity Abdominal: Soft. Bowel sounds are normal.  exhibits no distension. There is no  tenderness.  Lymphadenopathy: no cervical adenopathy. No axillary adenopathy Neurological: alert and oriented to person, place, and time.  Skin: Skin is warm and dry. No rash noted. No erythema.  Psychiatric: a normal mood and affect.  behavior is normal.    Lab Results  Component Value Date   CD4TCELL 30 (L) 08/31/2023   Lab Results  Component Value Date   CD4TABS 584 08/31/2023   CD4TABS 577 03/04/2023   CD4TABS 618 09/28/2022   Lab Results  Component Value Date   HIV1RNAQUANT <20 (H) 08/31/2023   Lab Results  Component Value Date   HEPBSAB NEG 02/08/2009   Lab Results  Component Value Date   LABRPR NON-REACTIVE 08/31/2023    CBC Lab Results  Component Value Date   WBC 4.9 08/31/2023   RBC 4.67 08/31/2023   HGB 13.6 08/31/2023   HCT 41.8 08/31/2023   PLT 347 08/31/2023   MCV 89.5 08/31/2023   MCH 29.1 08/31/2023   MCHC 32.5 08/31/2023   RDW 12.8 08/31/2023   LYMPHSABS 1,983 03/04/2023   MONOABS 0.3 04/01/2022   EOSABS 181 08/31/2023    BMET Lab Results  Component Value Date   NA 141 08/31/2023   K 4.4 08/31/2023   CL 102 08/31/2023   CO2 32 08/31/2023   GLUCOSE 194 (H) 08/31/2023  BUN 13 08/31/2023   CREATININE 0.71 08/31/2023   CALCIUM 10.3 08/31/2023   GFRNONAA >60 04/03/2022   GFRAA 96 02/20/2021      Assessment and Plan  HIV disease= well controlled. Will give refills on biktarvy  Long term medication management = cr is stable on recent labs  T2DM = hyperglycemia noted on recent labs  Hld = needs to take atorvastatin daily, check fasting lipids at next visit  Health maintenance = already has had both flu and covid; mammogram is due this year.  Rtc 6 month  I have personally 40 time spent includes the following activities: Preparing to see the patient (review of tests), Obtaining and/or reviewing separately obtained history (admission/discharge record), Performing a medically appropriate examination and/or evaluation , Ordering  medications/tests/procedures, referring and communicating with other health care professionals, Documenting clinical information in the EMR, Independently interpreting results (not separately reported), Communicating results to the patient/family/caregiver, Counseling and educating the patient/family/caregiver and Care coordination (not separately reported).

## 2023-09-21 ENCOUNTER — Other Ambulatory Visit: Payer: Self-pay | Admitting: Internal Medicine

## 2023-09-21 DIAGNOSIS — B20 Human immunodeficiency virus [HIV] disease: Secondary | ICD-10-CM

## 2023-09-22 ENCOUNTER — Other Ambulatory Visit: Payer: Self-pay | Admitting: Student

## 2023-10-05 ENCOUNTER — Other Ambulatory Visit: Payer: Self-pay | Admitting: Student

## 2023-10-17 DIAGNOSIS — G4733 Obstructive sleep apnea (adult) (pediatric): Secondary | ICD-10-CM | POA: Diagnosis not present

## 2023-10-17 DIAGNOSIS — B20 Human immunodeficiency virus [HIV] disease: Secondary | ICD-10-CM | POA: Diagnosis not present

## 2023-10-17 DIAGNOSIS — R0902 Hypoxemia: Secondary | ICD-10-CM | POA: Diagnosis not present

## 2023-10-17 DIAGNOSIS — R062 Wheezing: Secondary | ICD-10-CM | POA: Diagnosis not present

## 2023-10-19 ENCOUNTER — Other Ambulatory Visit: Payer: Self-pay | Admitting: Student

## 2023-11-17 DIAGNOSIS — R0902 Hypoxemia: Secondary | ICD-10-CM | POA: Diagnosis not present

## 2023-11-17 DIAGNOSIS — R062 Wheezing: Secondary | ICD-10-CM | POA: Diagnosis not present

## 2023-11-17 DIAGNOSIS — B20 Human immunodeficiency virus [HIV] disease: Secondary | ICD-10-CM | POA: Diagnosis not present

## 2023-11-17 DIAGNOSIS — G4733 Obstructive sleep apnea (adult) (pediatric): Secondary | ICD-10-CM | POA: Diagnosis not present

## 2023-11-19 ENCOUNTER — Other Ambulatory Visit: Payer: Self-pay | Admitting: Family Medicine

## 2023-11-19 DIAGNOSIS — L739 Follicular disorder, unspecified: Secondary | ICD-10-CM

## 2023-12-01 IMAGING — CT CT ABD-PELV W/ CM
2 of 4 series · 16 of 46 positions shown, 18 images · IV contrast (Omni 300)
Comparison: None Available.

CLINICAL DATA: Abdominal pain and diarrhea for 4 days.

EXAM:
CT ABDOMEN AND PELVIS WITH CONTRAST
TECHNIQUE: Multidetector CT imaging of the abdomen and pelvis was performed
using the standard protocol following bolus administration of
intravenous contrast.

[Series 3: a/p w/ 5mm · axial · 0.75mm/px · z∈[+908,+1328]mm · 13 of 93 slices shown, 15 images]
[im 5/93  soft-tissue]
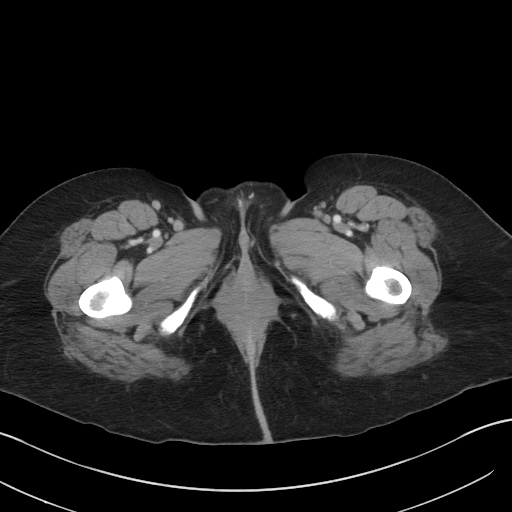
[im 5/93  bone]
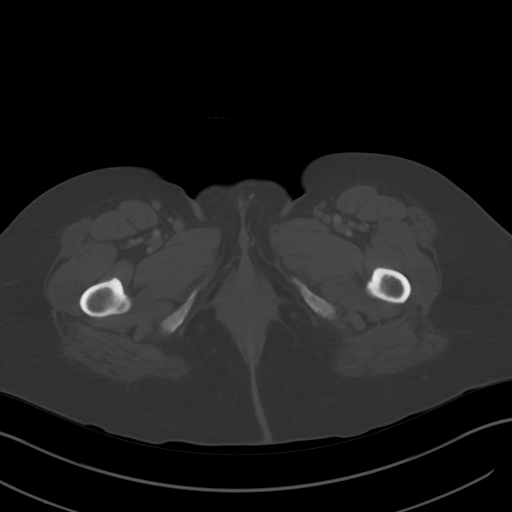
[im 13/93  soft-tissue]
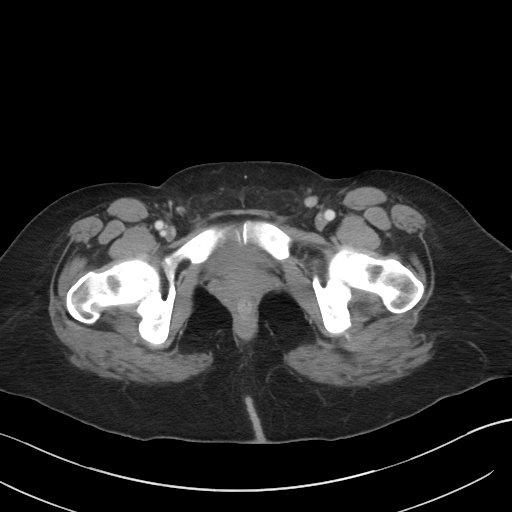
[im 21/93  soft-tissue]
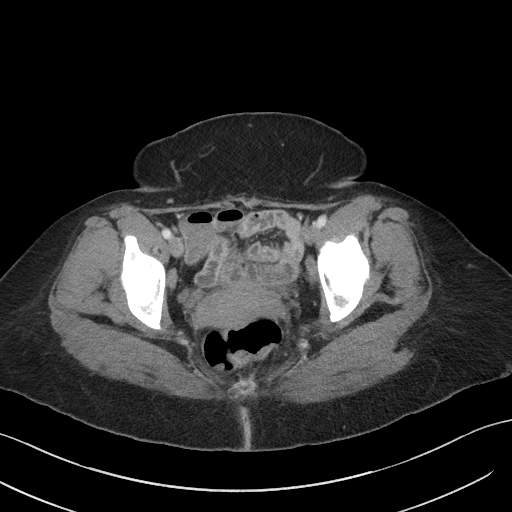
[im 25/93  soft-tissue]
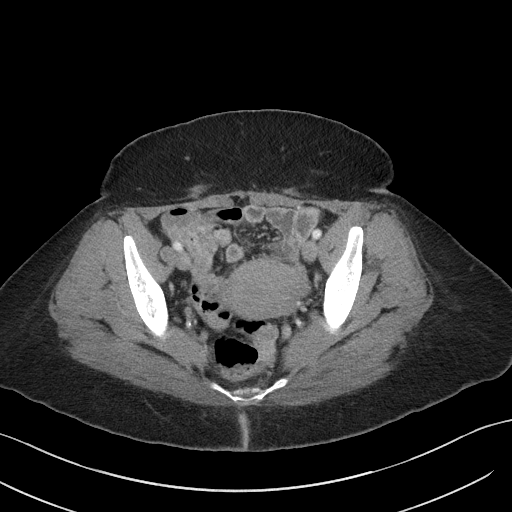
[im 33/93  soft-tissue]
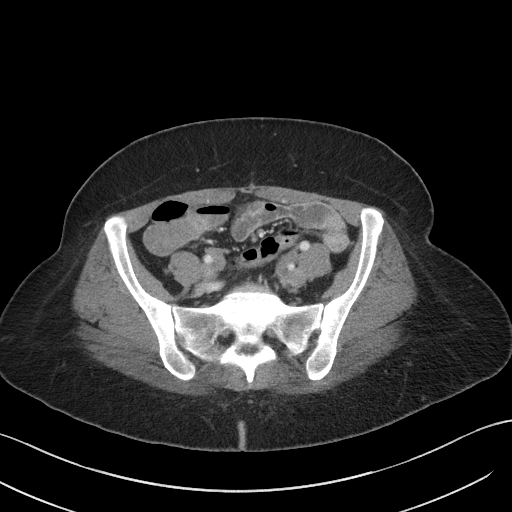
[im 41/93  soft-tissue]
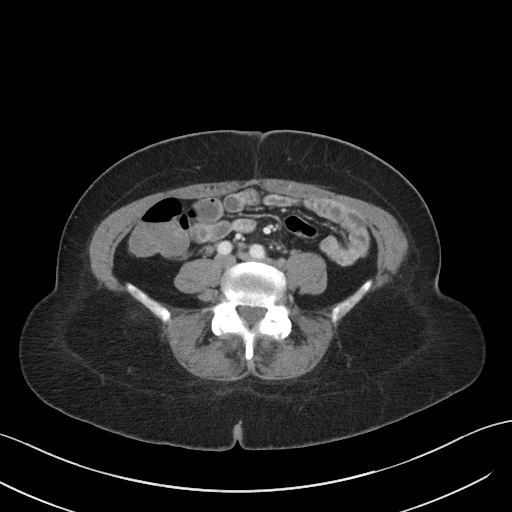
[im 49/93  soft-tissue]
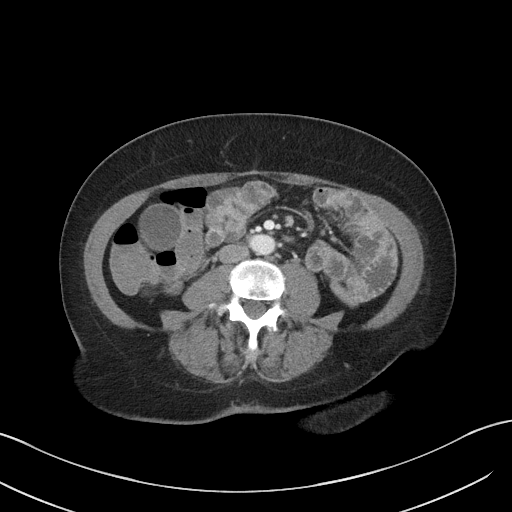
[im 53/93  soft-tissue]
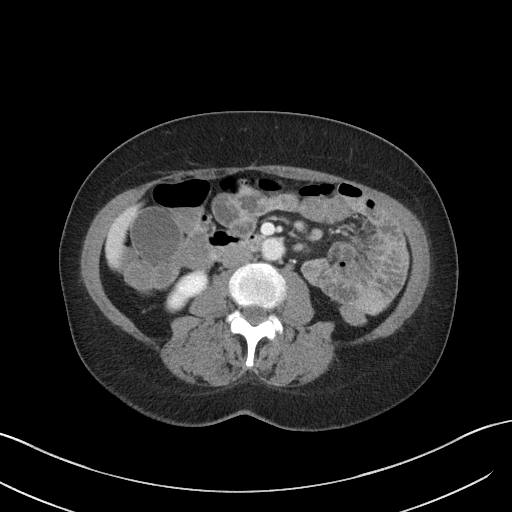
[im 61/93  soft-tissue]
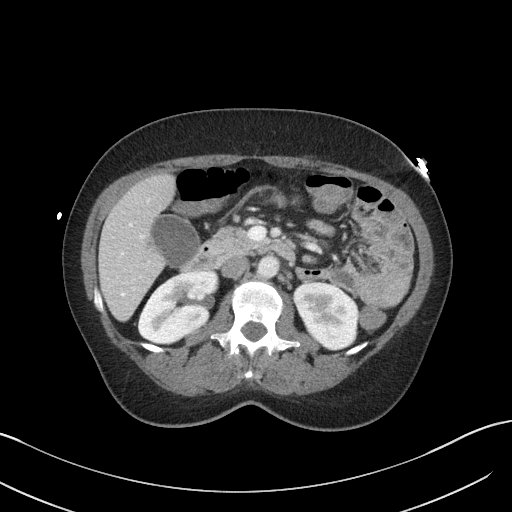
[im 61/93  bone]
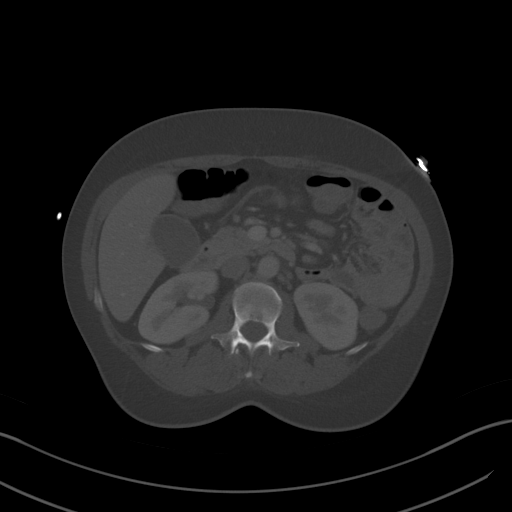
[im 69/93  soft-tissue]
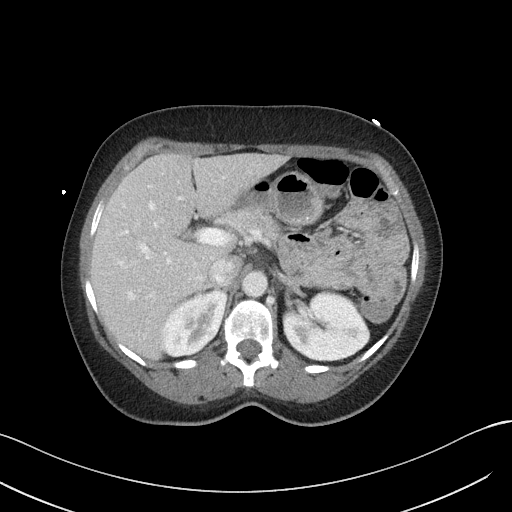
[im 73/93  soft-tissue]
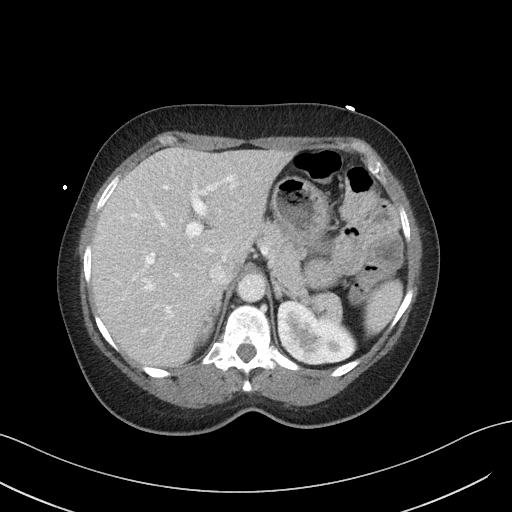
[im 81/93  soft-tissue]
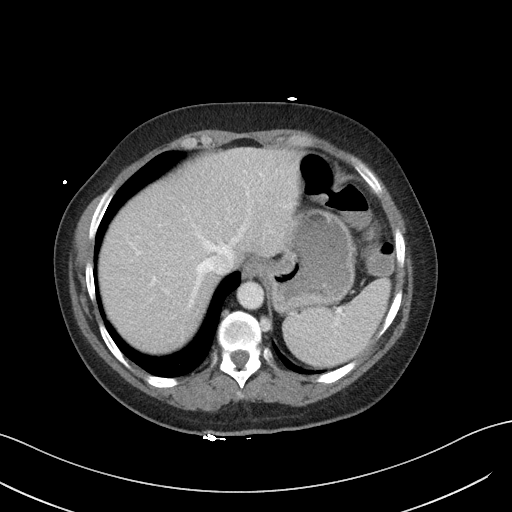
[im 89/93  soft-tissue]
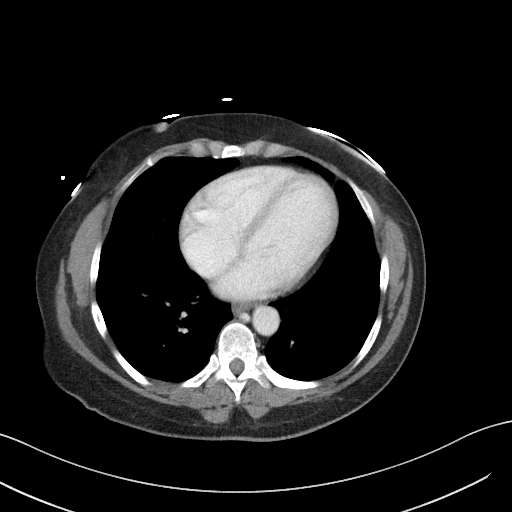

[Series 6: a/p w/ cor · coronal · 0.73mm/px · 3 of 151 slices shown]
[im 51/151  soft-tissue]
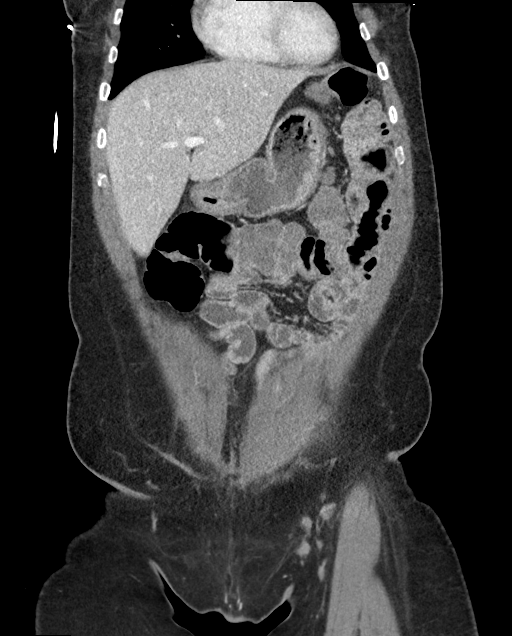
[im 67/151  soft-tissue]
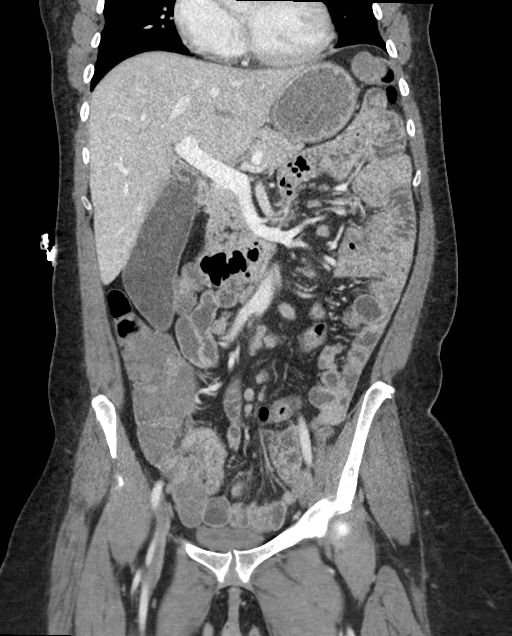
[im 84/151  soft-tissue]
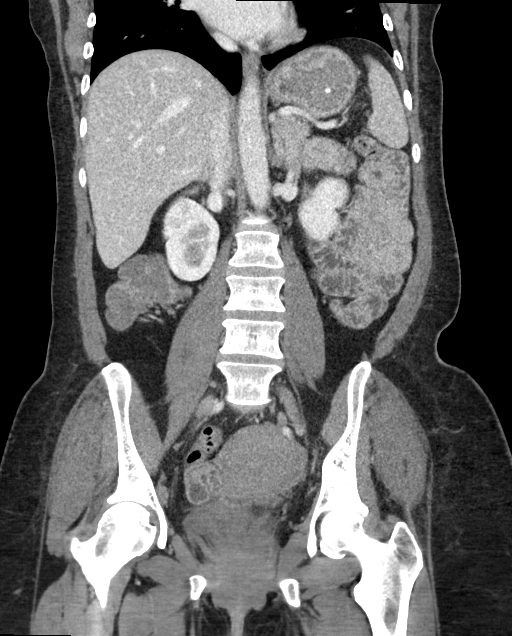

[16 of 46 positions shown; findings below may reference images not displayed]

RADIATION DOSE REDUCTION: This exam was performed according to the
departmental dose-optimization program which includes automated
exposure control, adjustment of the mA and/or kV according to
patient size and/or use of iterative reconstruction technique.

CONTRAST:  100mL OMNIPAQUE IOHEXOL 300 MG/ML  SOLN
FINDINGS: Lower chest: No acute abnormality.

Hepatobiliary: Small amount of focal fat along the gallbladder
fossa. No other focal liver abnormality. Mildly distended
gallbladder without wall thickening or gallstones. No biliary
dilatation.

Pancreas: Unremarkable. No pancreatic ductal dilatation or
surrounding inflammatory changes.

Spleen: Normal in size without focal abnormality.

Adrenals/Urinary Tract: Adrenal glands are unremarkable. Kidneys are
normal, without renal calculi, focal lesion, or hydronephrosis.
Bladder is mostly decompressed.

Stomach/Bowel: The stomach is within normal limits. No bowel wall
thickening, distention, or surrounding inflammatory changes.
Scattered small bowel mucosal enhancement. Liquid stool in the right
colon. Diminutive or absent appendix.

Vascular/Lymphatic: No significant vascular findings. Prominent
subcentimeter mesenteric lymph nodes, likely reactive.

Reproductive: Uterus and bilateral adnexa are unremarkable.

Other: No abdominal wall hernia or abnormality. No abdominopelvic
ascites. No pneumoperitoneum.

Musculoskeletal: No acute or significant osseous findings.
IMPRESSION: 1. Findings suggestive of enterocolitis.

## 2023-12-07 ENCOUNTER — Ambulatory Visit: Payer: Medicaid Other | Admitting: Obstetrics and Gynecology

## 2023-12-18 DIAGNOSIS — R062 Wheezing: Secondary | ICD-10-CM | POA: Diagnosis not present

## 2023-12-18 DIAGNOSIS — G4733 Obstructive sleep apnea (adult) (pediatric): Secondary | ICD-10-CM | POA: Diagnosis not present

## 2023-12-18 DIAGNOSIS — R0902 Hypoxemia: Secondary | ICD-10-CM | POA: Diagnosis not present

## 2023-12-18 DIAGNOSIS — B20 Human immunodeficiency virus [HIV] disease: Secondary | ICD-10-CM | POA: Diagnosis not present

## 2024-01-08 ENCOUNTER — Other Ambulatory Visit: Payer: Self-pay | Admitting: Student

## 2024-01-08 DIAGNOSIS — J302 Other seasonal allergic rhinitis: Secondary | ICD-10-CM

## 2024-01-10 ENCOUNTER — Telehealth: Payer: Self-pay | Admitting: Pharmacist

## 2024-01-10 NOTE — Telephone Encounter (Signed)
 Attempted to contact patient for follow-up of DM/librate study   Patient does not have voicemail box set up. Unable to leave message.  Total time with patient call and documentation of interaction: 1 minutes.

## 2024-01-14 ENCOUNTER — Telehealth: Payer: Self-pay | Admitting: Student

## 2024-01-14 ENCOUNTER — Ambulatory Visit (INDEPENDENT_AMBULATORY_CARE_PROVIDER_SITE_OTHER): Payer: Medicaid Other | Admitting: Student

## 2024-01-14 ENCOUNTER — Encounter: Payer: Self-pay | Admitting: Student

## 2024-01-14 VITALS — BP 120/76 | HR 75 | Wt 186.4 lb

## 2024-01-14 DIAGNOSIS — Z794 Long term (current) use of insulin: Secondary | ICD-10-CM | POA: Diagnosis not present

## 2024-01-14 DIAGNOSIS — Z1231 Encounter for screening mammogram for malignant neoplasm of breast: Secondary | ICD-10-CM

## 2024-01-14 DIAGNOSIS — E1165 Type 2 diabetes mellitus with hyperglycemia: Secondary | ICD-10-CM

## 2024-01-14 LAB — POCT GLYCOSYLATED HEMOGLOBIN (HGB A1C): HbA1c POC (<> result, manual entry): >15 % (ref 4.0–5.6)

## 2024-01-14 NOTE — Assessment & Plan Note (Addendum)
 A1c 15 today.  Poorly controlled.  Previously well-controlled with A1c of 6.9, patient was taking all of her medications this time.  Highly suspicious that weight loss and fatigue related to uncontrolled diabetes.  Counseled patient.  See telephone note separately. - 10 units of insulin daily - Restart Trulicity, okay to restart 3 mg however if having significant side effects would decrease and retitrate - Follow-up next week with Dr. Raymondo Band - Follow-up with PCP scheduled ~1 week - UACR today

## 2024-01-14 NOTE — Telephone Encounter (Signed)
 Called patient, confirmed identity.  Discussed A1c greater than 15.  Patient reports that she is actually not taking her Trulicity nor insulin for several months now.  She reports she still has supply of insulin and Trulicity at home.  I discussed that I am very concerned for her wellbeing with her sugars being this uncontrolled.  Additionally, I explained that I believe her continued weight loss/increased appetite/fatigue is likely result of her body's inability to absorb nutrition without insulin (specifically glucose).  After thorough discussion, patient agrees to start 10 units of Lantus daily.  She will continue her Trulicity.  She is not exactly sure how long it has been since she is taking Trulicity, she has been well-controlled Trulicity for nearly a year.  We discussed restarting at her normal dose, however she has bad side effects would recommend decreasing her dose and titrating back up.  She has follow-up with our pharmacy clinic next week.  Additionally, she has appointment on 01/24/2024 with PCP.  Will forward to pharmacy team.

## 2024-01-14 NOTE — Patient Instructions (Addendum)
 It was great to see you! Thank you for allowing me to participate in your care!   I recommend that you always bring your medications to each appointment as this makes it easy to ensure we are on the correct medications and helps [place] not miss when refills are needed.  Our plans for today:  - Please stop your insulin - Please wear your CGM - Please follow-up with Dr. Raymondo Band next week - I recommend you go to an eye doctor for an evaluation - Please make an appointment for pap smear  I recommend you undergo a mammogram.   You can call to schedule an appointment by calling [phone].  Directions [address] [place], [place] [address]  Take care and seek immediate care sooner if you develop any concerns. Please remember to show up 15 minutes before your scheduled appointment time!  Tiffany Kocher, DO [facility]

## 2024-01-14 NOTE — Progress Notes (Signed)
    SUBJECTIVE:   CHIEF COMPLAINT / HPI:   Type 2 diabetes/fatigue/weight loss Initially, patient was concern for weight loss.  She is lost approximately 9 pounds since her visit in 04/29/2023.  Initially, this was thought to be because of better control of her diabetes and better compliance with Trulicity.  She has not been using her CGM for at least 2 months.  Initially, patient reported she was taking 4 units of her insulin and Trulicity weekly.  However A1c greater than 15 today.  See telephone encounter for details.  With regard to weight loss, she has no fevers/chills/decreased appetite/change in bowel habits.   OBJECTIVE:   BP 120/76   Pulse 75   Wt 186 lb 6 oz (84.5 kg)   LMP 12/05/2021   SpO2 99%   BMI 33.01 kg/m    General: NAD, well-appearing, well-nourished Respiratory: No respiratory distress, breathing comfortably, able to speak in full sentences Skin: warm and dry, no rashes noted on exposed skin Psych: Appropriate affect and mood  ASSESSMENT/PLAN:   Assessment & Plan Type 2 diabetes mellitus with hyperglycemia, with long-term current use of insulin (HCC) A1c 15 today.  Poorly controlled.  Previously well-controlled with A1c of 6.9, patient was taking all of her medications this time.  Highly suspicious that weight loss and fatigue related to uncontrolled diabetes.  Counseled patient.  See telephone note separately. - 10 units of insulin daily - Restart Trulicity, okay to restart 3 mg however if having significant side effects would decrease and retitrate - Follow-up next week with Dr. Raymondo Band - Follow-up with PCP scheduled ~1 week - UACR today  Follow-up recommendations Mammogram ordered, discussed need for mammogram Needs ophthalmology exam, discussed need for this Need for cervical cancer screening, patient will schedule appointment   Tiffany Kocher, DO [facility]

## 2024-01-15 DIAGNOSIS — R062 Wheezing: Secondary | ICD-10-CM | POA: Diagnosis not present

## 2024-01-15 DIAGNOSIS — B20 Human immunodeficiency virus [HIV] disease: Secondary | ICD-10-CM | POA: Diagnosis not present

## 2024-01-15 DIAGNOSIS — R0902 Hypoxemia: Secondary | ICD-10-CM | POA: Diagnosis not present

## 2024-01-15 DIAGNOSIS — G4733 Obstructive sleep apnea (adult) (pediatric): Secondary | ICD-10-CM | POA: Diagnosis not present

## 2024-01-16 LAB — MICROALBUMIN / CREATININE URINE RATIO
Creatinine, Urine: 18.6 mg/dL
Microalb/Creat Ratio: <16 mg/g{creat} (ref 0–29)
Microalbumin, Urine: <3 ug/mL

## 2024-01-17 ENCOUNTER — Encounter: Payer: Self-pay | Admitting: Student

## 2024-01-17 ENCOUNTER — Other Ambulatory Visit: Payer: Self-pay

## 2024-01-17 DIAGNOSIS — E1165 Type 2 diabetes mellitus with hyperglycemia: Secondary | ICD-10-CM

## 2024-01-17 MED ORDER — INSULIN PEN NEEDLE 31G X 5 MM MISC: 1.0000 | Freq: Every day | 3 refills | Status: AC

## 2024-01-21 ENCOUNTER — Ambulatory Visit: Payer: Medicaid Other | Admitting: Pharmacist

## 2024-01-21 NOTE — Telephone Encounter (Signed)
 Attempted to contact patient following missed appointment this AM for poorly controlled diabetes.   No Answer / No Voice Mail Set-up.   Unfortunately, this patient was in the Liberate study and was doing very well with an A1C of 6.9 in July 2025 (three month visit).   Recently seen by Dr. Claudean Severance and patient admitted non-adherence with medications.   At this time, she will be considered lost to follow-up for the Liberate Study and no further research visits will be planned.  Please send back to me in Pharmacy clinic at any time when patient is interested.   Total time with patient call and documentation of interaction: 11 minutes.  F/U Phone call planned: None.

## 2024-01-24 ENCOUNTER — Encounter: Payer: Self-pay | Admitting: Student

## 2024-01-24 ENCOUNTER — Ambulatory Visit (INDEPENDENT_AMBULATORY_CARE_PROVIDER_SITE_OTHER): Payer: Medicaid Other | Admitting: Student

## 2024-01-24 VITALS — BP 150/90 | HR 70 | Ht 63.0 in | Wt 194.0 lb

## 2024-01-24 DIAGNOSIS — E1165 Type 2 diabetes mellitus with hyperglycemia: Secondary | ICD-10-CM | POA: Diagnosis present

## 2024-01-24 DIAGNOSIS — E1159 Type 2 diabetes mellitus with other circulatory complications: Secondary | ICD-10-CM

## 2024-01-24 DIAGNOSIS — I152 Hypertension secondary to endocrine disorders: Secondary | ICD-10-CM | POA: Diagnosis not present

## 2024-01-24 DIAGNOSIS — Z794 Long term (current) use of insulin: Secondary | ICD-10-CM | POA: Diagnosis not present

## 2024-01-24 NOTE — Assessment & Plan Note (Signed)
 Poorly controlled, noncompliant with spironolactone.  Allergy to lisinopril (swelling), not a good candidate for ARB.  Discussed continuing spironolactone or starting other medication including hydrochlorothiazide.  Patient's primary issue with compliance is pill burden.  Not a good combo pill with amlodipine (amlodipine provides decent pressure control, would not discontinue).  Could consider spironolactone-hydrochlorothiazide combo, however this would still be an extra pill to take. Patient has agreed to start retaking her spironolactone. - Recheck in 2 weeks - Follow-up with pharmacy clinic in 2 weeks - Follow-up with PCP in 2-4 weeks

## 2024-01-24 NOTE — Telephone Encounter (Signed)
 Reviewed and agree with Dr Macky Lower plan.

## 2024-01-24 NOTE — Assessment & Plan Note (Addendum)
 Patient needs medication reconciliation.  Poorly controlled.  See above HPI for details. - Start 22 units Lantus daily - Write down blood glucose or use libre - Trulicity 3 mg weekly (counseled on proper use) - Follow-up in 2 weeks with pharmacy clinic - Follow-up in 2-4 weeks with PCP

## 2024-01-24 NOTE — Progress Notes (Signed)
    SUBJECTIVE:   CHIEF COMPLAINT / HPI:   T2DM Reports taking 2 pens once a week, of Trulicity. Start taking every Friday.  Patient reports better diet now.   States she is wearing her   18 U of insulin. Reports Koval told her too.  Does not have her blood sugars today, but reports wearing her libre.   Waking up morning is 250  Reports insulin is better in the afternoon.  HTN Taking her amlodipine. Not taking her spironolactone. Taking Lasix for leg edema- question if we can find a better regimen for her.   OBJECTIVE:   BP (!) 150/90   Pulse 70   Ht 5\' 3"  (1.6 m)   Wt 194 lb (88 kg)   LMP 12/05/2021   SpO2 98%   BMI 34.37 kg/m    General: NAD, pleasant Cardio: RRR, no MRG. Cap Refill <2s. Respiratory: CTAB, normal wob on RA GI: Abdomen is soft, not tender, not distended. BS present  ASSESSMENT/PLAN:   Assessment & Plan Type 2 diabetes mellitus with hyperglycemia, with long-term current use of insulin (HCC) Patient needs medication reconciliation.  Poorly controlled.  See above HPI for details. - Start 22 units Lantus daily - Write down blood glucose or use libre - Trulicity 3 mg weekly (counseled on proper use) - Follow-up in 2 weeks with pharmacy clinic - Follow-up in 2-4 weeks with PCP Hypertension associated with type 2 diabetes mellitus (HCC) Poorly controlled, noncompliant with spironolactone.  Allergy to lisinopril (swelling), not a good candidate for ARB.  Discussed continuing spironolactone or starting other medication including hydrochlorothiazide.  Patient's primary issue with compliance is pill burden.  Not a good combo pill with amlodipine (amlodipine provides decent pressure control, would not discontinue).  Could consider spironolactone-hydrochlorothiazide combo, however this would still be an extra pill to take. Patient has agreed to start retaking her spironolactone. - Recheck in 2 weeks - Follow-up with pharmacy clinic in 2 weeks - Follow-up  with PCP in 2-4 weeks  Follow-up recommendations Patient instructed to bring medications to next appointment for medication reconciliation. She was counseled on ophthalmology exam at last visit **For PCP: Make sure patient schedules cervical cancer screening**  Tiffany Kocher, DO [facility]

## 2024-01-24 NOTE — Patient Instructions (Signed)
 It was great to see you! Thank you for allowing me to participate in your care!   I recommend that you always bring your medications to each appointment as this makes it easy to ensure we are on the correct medications and helps [place] not miss when refills are needed.  Our plans for today:  - Take your lasix during the day time - Please check your blood sugar when you wake up, before you eat. And check it 2 hours after you eat a meal. - Please take 22 Units of Lantus daily - Please take your spironolactone -Follow-up in 2 weeks  Take care and seek immediate care sooner if you develop any concerns. Please remember to show up 15 minutes before your scheduled appointment time!  Tiffany Kocher, DO [facility]

## 2024-01-30 ENCOUNTER — Other Ambulatory Visit: Payer: Self-pay | Admitting: Student

## 2024-01-31 ENCOUNTER — Telehealth: Payer: Self-pay

## 2024-01-31 NOTE — Telephone Encounter (Signed)
 Received phone call from pharmacy regarding PA being needed for Trulicity.   Submitted PA via [employer].   Key: [id number]  Will await response.   Veronda Prude, RN

## 2024-02-02 ENCOUNTER — Other Ambulatory Visit: Payer: Self-pay | Admitting: Student

## 2024-02-04 ENCOUNTER — Other Ambulatory Visit: Payer: Self-pay | Admitting: Student

## 2024-02-04 ENCOUNTER — Ambulatory Visit: Payer: Medicaid Other | Admitting: Internal Medicine

## 2024-02-04 DIAGNOSIS — I152 Hypertension secondary to endocrine disorders: Secondary | ICD-10-CM

## 2024-02-04 DIAGNOSIS — I5032 Chronic diastolic (congestive) heart failure: Secondary | ICD-10-CM

## 2024-02-07 ENCOUNTER — Encounter: Payer: Self-pay | Admitting: Pharmacist

## 2024-02-07 ENCOUNTER — Ambulatory Visit (INDEPENDENT_AMBULATORY_CARE_PROVIDER_SITE_OTHER): Admitting: Pharmacist

## 2024-02-07 VITALS — BP 125/78 | HR 74 | Wt 194.0 lb

## 2024-02-07 DIAGNOSIS — E1165 Type 2 diabetes mellitus with hyperglycemia: Secondary | ICD-10-CM

## 2024-02-07 DIAGNOSIS — Z794 Long term (current) use of insulin: Secondary | ICD-10-CM

## 2024-02-07 DIAGNOSIS — I152 Hypertension secondary to endocrine disorders: Secondary | ICD-10-CM

## 2024-02-07 DIAGNOSIS — E1159 Type 2 diabetes mellitus with other circulatory complications: Secondary | ICD-10-CM

## 2024-02-07 NOTE — Assessment & Plan Note (Signed)
 Diabetes longstanding currently much improved with good control over the past several days. Patient is able to verbalize appropriate hypoglycemia management plan. Medication adherence reports adherence.  -Decreased basal insulin Lantus (insulin glargine) from 22 units to 14 units daily. -Continued GLP-1 Trulicity (dulaglutide) 3 mg weekly.  -Extensively discussed pathophysiology of diabetes, recommended lifestyle interventions, dietary effects on blood sugar control.  -Counseled on s/sx of and management of hypoglycemia.

## 2024-02-07 NOTE — Assessment & Plan Note (Signed)
 Hypertension longstanding currently controlled. Blood pressure goal of <130/80 mmHg. Medication adherence variable.  Encouraged daily adherence. -Continued amlodipine 10 mg daily -Continued spironolactone 25 mg daily

## 2024-02-07 NOTE — Progress Notes (Signed)
 S:     Chief Complaint  Patient presents with   Medication Management    Diabetes Follow Up   58 y.o. female who presents for diabetes evaluation, education, and management. Patient arrives in good spirits and presents without any assistance.   Patient was referred and last seen by Primary Care Provider, Dr. Claudean Severance on 01/24/2024. PMH is significant for HTN, HLD, T2DM, HIV  At last visit, Lantus was increased to 22 units daily  Patient reports Diabetes was diagnosed in 2021    Current diabetes medications include: Trulicity (dulaglutide) 3 mg weekly, Lantus (insulin glargine) 22 units daily Current hypertension medications include: amlodipine 10 mg daily, spironolactone 25 mg daily Current hyperlipidemia medications include: atorvastatin 40 mg daily  Patient reports adherence to taking all medications as prescribed.  Insurance coverage: Medicaid  Patient reports hypoglycemic events overnight last night.    O:   Review of Systems  Respiratory:  Positive for wheezing.     Physical Exam Neurological:     Mental Status: She is alert.  Psychiatric:        Mood and Affect: Mood normal.        Thought Content: Thought content normal.        Judgment: Judgment normal.     7 day average blood glucose: 159  Libre3 CGM Download today 01/25/2024-02/07/2024 % Time CGM is active: 71% Average Glucose: 159 mg/dL Glucose Management Indicator: 7.1  Glucose Variability: 28.6% (goal <36%) Time in Goal:  - Time in range 70-180: 72% - Time above range: 27% - Time below range: 1% Observed patterns: Overnight lows last night will decrease Lantus   Lab Results  Component Value Date   HGBA1C >15.0 01/14/2024   Vitals:   02/07/24 1144  Pulse: 74  SpO2: 100%    Lipid Panel     Component Value Date/Time   CHOL 233 (H) 08/31/2023 1041   CHOL 156 03/13/2022 1321   TRIG 122 08/31/2023 1041   HDL 74 08/31/2023 1041   HDL 69 03/13/2022 1321   CHOLHDL 3.1 08/31/2023 1041    VLDL 28 03/30/2017 1555   LDLCALC 136 (H) 08/31/2023 1041   LDLDIRECT 144 (H) 01/06/2021 1514    Clinical Atherosclerotic Cardiovascular Disease (ASCVD): No  The 10-year ASCVD risk score (Arnett DK, et al., 2019) is: 17.8%   Values used to calculate the score:     Age: 89 years     Sex: Female     Is Non-Hispanic African American: Yes     Diabetic: Yes     Tobacco smoker: No     Systolic Blood Pressure: 150 mmHg     Is BP treated: Yes     HDL Cholesterol: 74 mg/dL     Total Cholesterol: 233 mg/dL   Patient is participating in a Managed Medicaid Plan:  Yes   A/P: Diabetes longstanding currently much improved with good control over the past several days. Patient is able to verbalize appropriate hypoglycemia management plan. Medication adherence reports adherence.  -Decreased basal insulin Lantus (insulin glargine) from 22 units to 14 units daily. -Continued GLP-1 Trulicity (dulaglutide) 3 mg weekly.  -Extensively discussed pathophysiology of diabetes, recommended lifestyle interventions, dietary effects on blood sugar control.  -Counseled on s/sx of and management of hypoglycemia.  -Next A1c anticipated 04/2024.   ASCVD risk - primary prevention in patient with diabetes. Last LDL is 136 not at goal of <16 mg/dL. ASCVD risk factors include HTN, HLD, T2DM and 10-year ASCVD risk score of  17.8. high intensity statin indicated.  -Continued atorvastatin 40 mg  Hypertension longstanding currently controlled. Blood pressure goal of <130/80 mmHg. Medication adherence variable.  Encouraged daily adherence. -Continued amlodipine 10 mg daily -Continued spironolactone 25 mg daily  Written patient instructions provided. Patient verbalized understanding of treatment plan.  Total time in face to face counseling 28 minutes.    Follow-up:  Pharmacist 03/14/2024 PCP clinic visit in 02/21/2024 Patient seen with Mack Guise, PharmD Candidate.

## 2024-02-07 NOTE — Telephone Encounter (Signed)
 PA was denied for Trulicity, as patient has not had documented improvement in diabetes.   Forwarding to PCP.   Veronda Prude, RN

## 2024-02-07 NOTE — Patient Instructions (Addendum)
 It was nice to see you today!  Your goal blood sugar is 80-130 before eating and less than 180 after eating.  Medication Changes:  Decrease Lantus (insulin glargine) to 14 units  Meds for your blood pressure: amlodipine, spironolactone   Continue all other medication the same.   Monitor blood sugars at home and keep a log (glucometer or piece of paper) to bring with you to your next visit.  Keep up the good work with diet and exercise. Aim for a diet full of vegetables, fruit and lean meats (chicken, [place], fish). Try to limit salt intake by eating fresh or frozen vegetables (instead of canned), rinse canned vegetables prior to cooking and do not add any additional salt to meals.

## 2024-02-07 NOTE — Progress Notes (Signed)
 Reviewed and agree with Dr Macky Lower plan.

## 2024-02-08 NOTE — Telephone Encounter (Signed)
 Outgoing call to attempt resolve denial for Trulicity due to lack of efficacy.   Discussed case with RN member who reviewed PA and filled in answer to question; "Did patient have improved control on Trulicity (dulaglutide)?" With the answer of NO due to a recent A1C value of > 15  Upon further review, patient admitted period of poor control was due to medication non-adherence AND dietary indiscretion.  She admits that stress and a new job which provided access to high carbohydrate foods/beverages was the key issue.   Since phone call with Dr. Claudean Severance 01/17/2024, patient has restarted her use of CGM, Trulicity and insulin regimen.   Patient now has GMI - control at 6.9 over the past two weeks.   Following multiple calls to multiple [facility] and [employer] phone numbers I was able to discuss appropriate appeals process with Crystal.    I submitted expedited appeal request online with her.  Additionally, it is required that the appeal be submitted in writing.   I faxed written appeal on patient behalf with CGM report including GMI of 6.9 over the last two weeks.  She has improved control on her current insulin + Trulicity regimen.   Contacted patient to share process and update.   Patient reports she has ~ 3 pens remaining at this time.  Anticipate decision from insurance plan within the next few weeks.   Asked patient to request new prescription in 1 month when she runs out of medication.   It is possible we may need to start with a new prescription/claim in 1 month.   Total time in review, discussion, communication and documentation: 77 minutes.

## 2024-02-09 NOTE — Telephone Encounter (Signed)
 Reviewed and agree with Dr Macky Lower plan.

## 2024-02-11 ENCOUNTER — Ambulatory Visit
Admission: RE | Admit: 2024-02-11 | Discharge: 2024-02-11 | Disposition: A | Discharge status: Home / Self Care | Source: Ambulatory Visit | Attending: Internal Medicine | Admitting: Internal Medicine

## 2024-02-11 DIAGNOSIS — Z0000 Encounter for general adult medical examination without abnormal findings: Secondary | ICD-10-CM

## 2024-02-15 DIAGNOSIS — R062 Wheezing: Secondary | ICD-10-CM | POA: Diagnosis not present

## 2024-02-15 DIAGNOSIS — B20 Human immunodeficiency virus [HIV] disease: Secondary | ICD-10-CM | POA: Diagnosis not present

## 2024-02-15 DIAGNOSIS — R0902 Hypoxemia: Secondary | ICD-10-CM | POA: Diagnosis not present

## 2024-02-15 DIAGNOSIS — G4733 Obstructive sleep apnea (adult) (pediatric): Secondary | ICD-10-CM | POA: Diagnosis not present

## 2024-02-16 ENCOUNTER — Ambulatory Visit: Admitting: Obstetrics and Gynecology

## 2024-02-16 ENCOUNTER — Encounter: Payer: Self-pay | Admitting: Obstetrics and Gynecology

## 2024-02-16 ENCOUNTER — Other Ambulatory Visit (HOSPITAL_COMMUNITY)
Admission: RE | Admit: 2024-02-16 | Discharge: 2024-02-16 | Disposition: A | Discharge status: Home / Self Care | Source: Ambulatory Visit | Attending: Obstetrics and Gynecology | Admitting: Obstetrics and Gynecology

## 2024-02-16 VITALS — BP 114/73 | HR 79 | Ht 63.0 in | Wt 190.0 lb

## 2024-02-16 DIAGNOSIS — Z113 Encounter for screening for infections with a predominantly sexual mode of transmission: Secondary | ICD-10-CM | POA: Diagnosis not present

## 2024-02-16 DIAGNOSIS — Z01419 Encounter for gynecological examination (general) (routine) without abnormal findings: Secondary | ICD-10-CM | POA: Insufficient documentation

## 2024-02-16 NOTE — Progress Notes (Signed)
 Pt is in office for annual exam, no complaints today.  Pt had mammo in March 2025.

## 2024-02-16 NOTE — Progress Notes (Signed)
 GYNECOLOGY ANNUAL PREVENTATIVE CARE ENCOUNTER NOTE  History:     Leslie Gates is a 58 y.o. G3P0 female here for a routine annual gynecologic exam.  Current complaints: none.   Denies abnormal vaginal bleeding, discharge, pelvic pain, problems with intercourse or other gynecologic concerns.   Pt notes amenorrhea, last menses was "last summer."   Gynecologic History Patient's last menstrual period was 12/05/2021. Contraception: post menopausal status Last Pap: 11/13/20. Results were: normal with negative HPV Last mammogram: 02/11/24. Results were: normal  Obstetric History OB History  Gravida Para Term Preterm AB Living  3     2  SAB IAB Ectopic Multiple Live Births      3    # Outcome Date GA Lbr Len/2nd Weight Sex Type Anes PTL Lv  3 Gravida           2 Gravida           1 [place]             Past Medical History:  Diagnosis Date   Allergic rhinitis    Allergy    Anemia    Anxiety    Arthritis    Asthma    Chronic otitis media    Coronary artery disease    Depression    Diabetes mellitus without complication (HCC)    Diastolic CHF, chronic (HCC)    Dysphagia    Genital warts 11/13/2020   GERD (gastroesophageal reflux disease)    History of cervical dysplasia    History of herpes genitalis 11/13/2020   History of herpes zoster    HTN (hypertension)    Human immunodeficiency virus (HIV) (HCC) 2001   Hyperlipidemia    Obesity    OSA (obstructive sleep apnea)    Oxygen deficiency    Pulmonary HTN (HCC)    Sleep apnea    Wrist fracture, bilateral     Past Surgical History:  Procedure Laterality Date   ADENOIDECTOMY     CESAREAN SECTION     NASAL SINUS SURGERY  2004   [facility]   RIGHT HEART CATHETERIZATION N/A 02/01/2012   Procedure: RIGHT HEART CATH;  Surgeon: Dolores Patty, MD;  Location[facility] CATH LAB;  Service: Cardiovascular;  Laterality: N/A;   TYMPANOSTOMY TUBE PLACEMENT     as a child   WRIST FRACTURE SURGERY  2006   bilateral - [place]     Current Outpatient Medications on File Prior to Visit  Medication Sig Dispense Refill   Accu-Chek Softclix Lancets lancets USE TO TEST BLOOD SUGAR FOUR TIMES DAILY AS DIRECTED 100 each 5   amLODipine (NORVASC) 10 MG tablet TAKE ONE TABLET BY MOUTH DAILY 90 tablet 1   atorvastatin (LIPITOR) 40 MG tablet TAKE ONE TABLET BY MOUTH DAILY - NEEDS APPT FOR FUTURE REFILLS- 90 tablet 3   BIKTARVY 50-200-25 MG TABS tablet TAKE ONE TABLET BY MOUTH DAILY (NEEDS OFFICE VISIT FOR FURTHER REFILLS) 30 tablet 5   blood glucose meter kit and supplies KIT Dispense based on patient and insurance preference. Use up to four times daily as directed. 1 each 0   cetirizine (ZYRTEC) 10 MG tablet TAKE ONE TABLET BY MOUTH EVERY DAY 90 tablet 1   clindamycin (CLEOCIN T) 1 % external solution APPLY TO THE AFFECTED AREA(S) TWICE DAILY 60 mL 2   furosemide (LASIX) 20 MG tablet TAKE ONE TABLET BY MOUTH EVERY DAY -MAY TAKE EXTRA TABLET EVERY DAY AS NEEDED FOR WEIGHT GAIN OF 3 LBS OR MORE OVERNIGHT 30  tablet 1   glucose blood (ACCU-CHEK AVIVA PLUS) test strip USE TO CHECK BLOOD SUGAR FOUR TIMES DAILY 100 each 12   insulin glargine (LANTUS SOLOSTAR) 100 UNIT/ML Solostar Pen Inject 8 Units into the skin daily. 15 mL 3   Insulin Pen Needle 31G X 5 MM MISC 1 each by Does not apply route daily. 100 each 3   Multiple Vitamin (TAB-A-VITE) TABS TAKE ONE TABLET BY MOUTH EVERY DAY 30 tablet 11   NAPHCON-A 0.025-0.3 % ophthalmic solution INSTILL 1 DROP INTO BOTH EYES FOUR TIMES A DAY AS NEEDED FOR IRRITATION (Patient taking differently: Place 1 drop into both eyes 4 (four) times daily as needed for eye irritation.) 15 mL 0   Olopatadine HCl (PATADAY) 0.2 % SOLN Apply 1 drop to eye daily as needed. 2.5 mL 3   omeprazole (PRILOSEC) 40 MG capsule TAKE ONE CAPSULE BY MOUTH EVERY DAY (Patient taking differently: Take 40 mg by mouth daily as needed (GERD).) 90 capsule 1   OXYGEN Inhale 2 L into the lungs at bedtime.     RESTASIS 0.05 %  ophthalmic emulsion Place 1 drop into both eyes 2 (two) times daily.     spironolactone (ALDACTONE) 25 MG tablet TAKE ONE-HALF TABLET BY MOUTH NIGHTLY AT BEDTIME 90 tablet 5   TRULICITY 3 MG/0.5ML SOAJ INJECT ONE PEN (THREE MG) SUBCUTANEOUSLY ONCE A WEEK 2 mL 3   VENTOLIN HFA 108 (90 Base) MCG/ACT inhaler INHALE TWO PUFFS INTO THE LUNGS EVERY 6 HOURS FOR WHEEZING OR SHORTNESS OF BREATH 18 g 5   No current facility-administered medications on file prior to visit.    Allergies  Allergen Reactions   Lisinopril Swelling    Very swollen bottom lip    Social History:  reports that she has never smoked. She has never used smokeless tobacco. She reports that she does not drink alcohol and does not use drugs.  Family History  Problem Relation Age of Onset   Other Father        GSW   Hypertension Father    Kidney disease Father        was on dialysis prior to death   Alcohol abuse Father        liver failure from alcohol use   Other Mother 110       ESRD   Hyperlipidemia Mother    Hypertension Mother    Alcohol abuse Mother    Hypertension Other    Hypertension Sister    Alcohol abuse Sister    Hypertension Brother    Alcohol abuse Brother     The following portions of the patient's history were reviewed and updated as appropriate: allergies, current medications, past family history, past medical history, past social history, past surgical history and problem list.  Review of Systems Pertinent items noted in HPI and remainder of comprehensive ROS otherwise negative.  Physical Exam:  BP 114/73   Pulse 79   Ht 5\' 3"  (1.6 m)   Wt 190 lb (86.2 kg)   LMP 12/05/2021   BMI 33.66 kg/m  CONSTITUTIONAL: Well-developed, well-nourished female in no acute distress.  HENT:  Normocephalic, atraumatic, External right and left ear normal. Oropharynx is clear and moist EYES: Conjunctivae and EOM are normal. Pupils are equal, round, and reactive to light. No scleral icterus.  NECK: Normal  range of motion, supple, no masses.  Normal thyroid.  SKIN: Skin is warm and dry. No rash noted. Not diaphoretic. No erythema. No pallor. MUSCULOSKELETAL: Normal range of motion.  No tenderness.  No cyanosis, clubbing, or edema.  2+ distal pulses. NEUROLOGIC: Alert and oriented to person, place, and time. Normal reflexes, muscle tone coordination.  PSYCHIATRIC: Normal mood and affect. Normal behavior. Normal judgment and thought content. CARDIOVASCULAR: Normal heart rate noted, regular rhythm RESPIRATORY: Clear to auscultation bilaterally. Effort and breath sounds normal, no problems with respiration noted. BREASTS: Symmetric in size. No masses, tenderness, skin changes, nipple drainage, or lymphadenopathy bilaterally. Performed in the presence of a chaperone. ABDOMEN: Soft, no distention noted.  No tenderness, rebound or guarding.  PELVIC: Normal appearing external genitalia and urethral meatus; normal appearing vaginal mucosa and cervix.  No abnormal discharge noted.  Pap smear obtained.  Normal uterine size, no other palpable masses, no uterine or adnexal tenderness.  Performed in the presence of a chaperone.   Assessment and Plan:    1. Women's annual routine gynecological examination (Primary) Normal annual exam  - Cytology - PAP( [place])  2. Routine screening for STI (sexually transmitted infection) Per pt request - Cervicovaginal ancillary only( [place])  Will follow up results of pap smear and manage accordingly. Mammogram scheduled Routine preventative health maintenance measures emphasized. Please refer to After Visit Summary for other counseling recommendations.     F/u in 1 year or prn  Mariel Aloe, MD, FACOG Obstetrician & Gynecologist, [facility] for [employer], [facility]

## 2024-02-17 ENCOUNTER — Encounter: Payer: Self-pay | Admitting: Obstetrics and Gynecology

## 2024-02-17 LAB — CERVICOVAGINAL ANCILLARY ONLY
Chlamydia: NEGATIVE
Comment: NEGATIVE
Comment: NEGATIVE
Comment: NORMAL
Neisseria Gonorrhea: NEGATIVE
Trichomonas: NEGATIVE

## 2024-02-21 ENCOUNTER — Telehealth: Payer: Self-pay

## 2024-02-21 ENCOUNTER — Ambulatory Visit: Admitting: Student

## 2024-02-21 NOTE — Telephone Encounter (Signed)
 Patient contacted for follow-up of "paperwork" request with PCP Following some discussion, it appeared that this patient was requested to complete medical forms, possibly Health Care Power of [place], with her PCP by her Medicaid insurance.     Since last contact patient reports doing better.  Reports adherence with medications.   - NO change to medication plans today  Missed Appt with PCP today.  Rescheduled with Dr. Claudean Severance, PCP in 9 days.  Instructed to bring forms to that visit.   Also made patient aware of her visit 4/29 with me.   Total time with patient call and documentation of interaction: 12 minutes.

## 2024-02-21 NOTE — Progress Notes (Deleted)
    SUBJECTIVE:   CHIEF COMPLAINT / HPI:   ***  PERTINENT  PMH / PSH: ***  OBJECTIVE:   LMP 12/05/2021   ***  ASSESSMENT/PLAN:   Assessment & Plan      Tiffany Kocher, DO [facility]

## 2024-02-21 NOTE — Telephone Encounter (Signed)
 Patient calls nurse line requesting to speak with Dr. Raymondo Band.  She reports she received a letter in the mail about her "shot." She reports "I have no idea what it says or means." She reports it look important and appears to need a signature of some kind.   Patient advised to drop off "letter" for review.   Patient became agitated stating this needs to be completed today and let me speak to Dr. Raymondo Band. She reports "he will know what to do."   Advised will forward to Dr. Raymondo Band.

## 2024-02-22 LAB — CYTOLOGY - PAP
Comment: NEGATIVE
Comment: NEGATIVE
Comment: NEGATIVE
Diagnosis: NEGATIVE
HPV 16: NEGATIVE
HPV 18 / 45: NEGATIVE
High risk HPV: POSITIVE — AB

## 2024-02-22 NOTE — Telephone Encounter (Signed)
 Reviewed and agree with Dr Macky Lower plan.

## 2024-03-01 ENCOUNTER — Encounter: Payer: Self-pay | Admitting: Student

## 2024-03-01 ENCOUNTER — Ambulatory Visit: Admitting: Student

## 2024-03-01 VITALS — BP 132/88 | HR 72 | Ht 63.0 in | Wt 195.8 lb

## 2024-03-01 DIAGNOSIS — I152 Hypertension secondary to endocrine disorders: Secondary | ICD-10-CM | POA: Diagnosis not present

## 2024-03-01 DIAGNOSIS — E1165 Type 2 diabetes mellitus with hyperglycemia: Secondary | ICD-10-CM

## 2024-03-01 DIAGNOSIS — E1159 Type 2 diabetes mellitus with other circulatory complications: Secondary | ICD-10-CM | POA: Diagnosis not present

## 2024-03-01 DIAGNOSIS — Z794 Long term (current) use of insulin: Secondary | ICD-10-CM

## 2024-03-01 NOTE — Assessment & Plan Note (Signed)
 Significant improved a.m. fasting blood glucose. - Continue Lantus 14 units daily - Continue Trulicity 3 mg weekly - Will send message to pharmacy with regard to CGM - Repeat A1c next month

## 2024-03-01 NOTE — Progress Notes (Signed)
   SUBJECTIVE:   CHIEF COMPLAINT / HPI:   Diabetes Patient presents for follow-up of 2 diabetes.  She is also managed by our pharmacy clinic.  She is currently taking 14 units of Lantus daily and 3 mg of Trulicity weekly.  She ran out of her CGM, appears there is an issue with insurance coverage.  Pharmacy had sent an appeal?  I will reach out to our pharmacy team.  She is currently checking her morning fasting and occasional postprandials with blood glucose monitor.  See below. 3/31: 157 4/01: 132 4/02: 100 4/12: 171 (patient binged that night) 4/15: 107  Several postprandials between 180-190.  Next A1c recheck 5/28  Blood pressure Well-controlled today.  Reports compliance with amlodipine and furosemide.  She is unsure if she is taking her spironolactone correctly.  She is asymptomatic and tolerating meds.  OBJECTIVE:   BP 132/88   Pulse 72   Ht 5\' 3"  (1.6 m)   Wt 195 lb 12.8 oz (88.8 kg)   LMP 12/05/2021   SpO2 95%   BMI 34.68 kg/m    General: NAD, pleasant Cardio: RRR, no MRG. Cap Refill <2s. Respiratory: CTAB, normal wob on RA Skin: Warm and dry  ASSESSMENT/PLAN:   Assessment & Plan Type 2 diabetes mellitus with hyperglycemia, with long-term current use of insulin (HCC) Significant improved a.m. fasting blood glucose. - Continue Lantus 14 units daily - Continue Trulicity 3 mg weekly - Will send message to pharmacy with regard to CGM - Repeat A1c next month Hypertension associated with type 2 diabetes mellitus (HCC) Well-controlled today. - Continue amlodipine 10 mg daily - Continue furosemide 20 mg daily - Continue spironolactone 25 mg daily   Lavada Porteous, DO [facility]

## 2024-03-01 NOTE — Patient Instructions (Addendum)
 It was great to see you! Thank you for allowing me to participate in your care!   I recommend that you always bring your medications to each appointment as this makes it easy to ensure we are on the correct medications and helps us  not miss when refills are needed.  Our plans for today:  - Please schedule appointment for Week of May 28. We will recheck A1c at that time - Please take amlodipine, furosemide and spironolactone (Aldactone) for blood pressure and heart failure - I recommend you calling insurance company and asked them what the appeal is for, asked them what the paperwork here asking us  for, and asking what they need from the physician - Continue taking Trulicity and 14 units of Lantus daily   Take care and seek immediate care sooner if you develop any concerns. Please remember to show up 15 minutes before your scheduled appointment time!  Lavada Porteous, DO [facility]

## 2024-03-01 NOTE — Assessment & Plan Note (Signed)
 Well-controlled today. - Continue amlodipine 10 mg daily - Continue furosemide 20 mg daily - Continue spironolactone 25 mg daily

## 2024-03-03 DIAGNOSIS — G4733 Obstructive sleep apnea (adult) (pediatric): Secondary | ICD-10-CM | POA: Diagnosis not present

## 2024-03-08 ENCOUNTER — Other Ambulatory Visit: Payer: Self-pay | Admitting: Internal Medicine

## 2024-03-08 DIAGNOSIS — B20 Human immunodeficiency virus [HIV] disease: Secondary | ICD-10-CM

## 2024-03-14 ENCOUNTER — Encounter: Payer: Self-pay | Admitting: Pharmacist

## 2024-03-14 ENCOUNTER — Ambulatory Visit: Admitting: Pharmacist

## 2024-03-14 VITALS — BP 133/72 | HR 77 | Wt 198.6 lb

## 2024-03-14 DIAGNOSIS — E1165 Type 2 diabetes mellitus with hyperglycemia: Secondary | ICD-10-CM

## 2024-03-14 DIAGNOSIS — Z794 Long term (current) use of insulin: Secondary | ICD-10-CM | POA: Diagnosis not present

## 2024-03-14 DIAGNOSIS — E1159 Type 2 diabetes mellitus with other circulatory complications: Secondary | ICD-10-CM | POA: Diagnosis not present

## 2024-03-14 DIAGNOSIS — I152 Hypertension secondary to endocrine disorders: Secondary | ICD-10-CM | POA: Diagnosis not present

## 2024-03-14 NOTE — Patient Instructions (Signed)
 It was nice to see you today!  Your goal blood sugar is 80-130 before eating and less than 180 after eating.  Medication Changes: Omeprazole  40 mg - for stomach acid  Continue all medications the same.  Monitor blood sugars at home and keep a log (glucometer or piece of paper) to bring with you to your next visit.  Keep up the good work with diet and exercise. Aim for a diet full of vegetables, fruit and lean meats (chicken, [place], fish). Try to limit salt intake by eating fresh or frozen vegetables (instead of canned), rinse canned vegetables prior to cooking and do not add any additional salt to meals.

## 2024-03-14 NOTE — Assessment & Plan Note (Signed)
 Hypertension longstanding currently controlled on amlodipine  10 mg and spironolactone  25 mg. Blood pressure goal of <130/80 mmHg. Medication adherence is good.  -Continued amlodipine  10 mg and spironolactone  25 mg.

## 2024-03-14 NOTE — Assessment & Plan Note (Signed)
 Diabetes longstanding with improved control from A1c of >15.0 to a current 7-day average blood glucose of 136. Patient is able to verbalize appropriate hypoglycemia management plan. Medication adherence appears to be good. Control is improving with adherence and dietary changes. -Continued basal insulin  Lantus  (insulin  glargine) at 14 units daily in the morning.  -Continued GLP-1 Trulicity  (dulaglutide ) at 3 mg once weekly.  -Extensively discussed pathophysiology of diabetes, recommended lifestyle interventions, dietary effects on blood sugar control.  -Counseled on s/sx of and management of hypoglycemia.

## 2024-03-14 NOTE — Progress Notes (Signed)
 S:     Chief Complaint  Patient presents with   Medication Management    Diabetes   58 y.o. female who presents for diabetes evaluation, education, and management. Patient arrives in good spirits and presents without any assistance.   Patient was referred and last seen by Primary Care Provider, Dr. Telford Feather, on 03/01/2024.   PMH is significant for T2DM, HLD, OSA, seasonal allergies, HIV.  At last visit, adjustments made to insulin  glargine and lasix .   Patient reports Diabetes was diagnosed in 2008.   Current diabetes medications include: insulin  glargine 14 units daily, Trulicity  3 mg once weekly Current hypertension medications include: amlodipine  10 mg, spironolactone  25 mg Current hyperlipidemia medications include: atorvastatin  40 mg  Patient reports adherence to taking all medications as prescribed.  -Checks blood sugar at home four times a day.  Patient denies hypoglycemic events.   O:   Review of Systems  All other systems reviewed and are negative.   Physical Exam Vitals reviewed.  Constitutional:      Appearance: Normal appearance.  Pulmonary:     Effort: Pulmonary effort is normal.  Neurological:     Mental Status: She is alert.  Psychiatric:        Mood and Affect: Mood normal.        Behavior: Behavior normal.        Thought Content: Thought content normal.        Judgment: Judgment normal.    7 day average blood glucose: 136   Lab Results  Component Value Date   HGBA1C >15.0 01/14/2024   Vitals:   03/14/24 1046 03/14/24 1049  BP: (!) 148/86 133/72  Pulse: 77   SpO2: 99%     Lipid Panel     Component Value Date/Time   CHOL 233 (H) 08/31/2023 1041   CHOL 156 03/13/2022 1321   TRIG 122 08/31/2023 1041   HDL 74 08/31/2023 1041   HDL 69 03/13/2022 1321   CHOLHDL 3.1 08/31/2023 1041   VLDL 28 03/30/2017 1555   LDLCALC 136 (H) 08/31/2023 1041   LDLDIRECT 144 (H) 01/06/2021 1514    Clinical Atherosclerotic Cardiovascular Disease  (ASCVD): No  The 10-year ASCVD risk score (Arnett DK, et al., 2019) is: 12.2%   Values used to calculate the score:     Age: 74 years     Sex: Female     Is Non-Hispanic African American: Yes     Diabetic: Yes     Tobacco smoker: No     Systolic Blood Pressure: 133 mmHg     Is BP treated: Yes     HDL Cholesterol: 74 mg/dL     Total Cholesterol: 233 mg/dL   Patient is participating in a Managed Medicaid Plan:  Yes   A/P: Diabetes longstanding with improved control from A1c of >15.0 to a current 7-day average blood glucose of 136. Patient is able to verbalize appropriate hypoglycemia management plan. Medication adherence appears to be good. Control is improving with adherence and dietary changes. -Continued basal insulin  Lantus  (insulin  glargine) at 14 units daily in the morning.  -Continued GLP-1 Trulicity  (dulaglutide ) at 3 mg once weekly.  -Extensively discussed pathophysiology of diabetes, recommended lifestyle interventions, dietary effects on blood sugar control.  -Counseled on s/sx of and management of hypoglycemia.  -Next A1c anticipated at next visit.   ASCVD risk - primary prevention in patient with diabetes. Last LDL is 136 (Oct. 2024) not at goal of <70 mg/dL. ASCVD risk factors include HTN,  HLD and T2DM and 10-year ASCVD risk score of 12.2%. High intensity statin indicated.  -Continued atorvastatin  40 mg.  -Consider increase to 80 mg since goal has not been met.  Hypertension longstanding currently controlled on amlodipine  10 mg and spironolactone  25 mg. Blood pressure goal of <130/80 mmHg. Medication adherence is good.  -Continued amlodipine  10 mg and spironolactone  25 mg.  Written patient instructions provided. Patient verbalized understanding of treatment plan.  Total time in face to face counseling 31 minutes.    Follow-up:  Pharmacist in six weeks PCP clinic visit as needed  Patient seen with Jeanine Millers, PharmD Candidate.

## 2024-03-15 NOTE — Progress Notes (Signed)
.  tmko 

## 2024-03-16 ENCOUNTER — Ambulatory Visit: Admitting: Obstetrics and Gynecology

## 2024-03-16 ENCOUNTER — Encounter: Payer: Self-pay | Admitting: Obstetrics and Gynecology

## 2024-03-16 ENCOUNTER — Other Ambulatory Visit (HOSPITAL_COMMUNITY)
Admission: RE | Admit: 2024-03-16 | Discharge: 2024-03-16 | Disposition: A | Discharge status: Home / Self Care | Source: Ambulatory Visit | Attending: Obstetrics and Gynecology | Admitting: Obstetrics and Gynecology

## 2024-03-16 VITALS — BP 135/89 | HR 86 | Ht 63.0 in | Wt 196.2 lb

## 2024-03-16 DIAGNOSIS — N950 Postmenopausal bleeding: Secondary | ICD-10-CM

## 2024-03-16 DIAGNOSIS — R87612 Low grade squamous intraepithelial lesion on cytologic smear of cervix (LGSIL): Secondary | ICD-10-CM | POA: Diagnosis not present

## 2024-03-16 DIAGNOSIS — G4733 Obstructive sleep apnea (adult) (pediatric): Secondary | ICD-10-CM | POA: Diagnosis not present

## 2024-03-16 DIAGNOSIS — Z3202 Encounter for pregnancy test, result negative: Secondary | ICD-10-CM | POA: Diagnosis not present

## 2024-03-16 DIAGNOSIS — N870 Mild cervical dysplasia: Secondary | ICD-10-CM | POA: Diagnosis not present

## 2024-03-16 DIAGNOSIS — R0902 Hypoxemia: Secondary | ICD-10-CM | POA: Diagnosis not present

## 2024-03-16 DIAGNOSIS — R87810 Cervical high risk human papillomavirus (HPV) DNA test positive: Secondary | ICD-10-CM | POA: Diagnosis not present

## 2024-03-16 DIAGNOSIS — B20 Human immunodeficiency virus [HIV] disease: Secondary | ICD-10-CM | POA: Diagnosis not present

## 2024-03-16 DIAGNOSIS — R062 Wheezing: Secondary | ICD-10-CM | POA: Diagnosis not present

## 2024-03-16 NOTE — Progress Notes (Signed)
    GYNECOLOGY OFFICE COLPOSCOPY PROCEDURE NOTE  58 y.o. G3P0 here for colposcopy for  NILM/HPV+  pap smear on 02/16/24. She has a history of HIV. She reports an episode of postmenopausal bleeding. She went 12+ months without a period and then unexpectedly had heavy vaginal bleeding a couple months ago. It stopped so she cancelled her appointment with us .   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 She is unsure if she got the vaccine  Informed consent and review of risks, benefit and alternatives performed. Written consent given.   Speculum inserted into patient's vagina assuring full view of cervix and vaginal walls. 3 swabs of vinegar solution applied to the cervix and vaginal walls and colposcope was used to observe both the cervix and vaginal walls.   Colposcopy adequate? No Thicker AWE at 12-1 o'clock and then another area with thin AWE and fine mosiacism at 7 o'clock. Corresponding biopsies obtained.  ECC collected.  All specimens were labeled and sent to pathology.  Good hemostasis noted and speculum removed.  Pt tolerated well with minimal pain and bleeding.   Cervix was stenotic with prolapsing vaginal walls. EMB deferred in favor of TVUS. If EMB is needed, will send pre procedural misoprostol.  We discussed option for LEEP if CIN1+ given nearly 2 decades of abnormal paps. She is considering. Would plan OR LEEP if needed.   Patient was given post procedure instructions.  Will follow up pathology and manage accordingly; patient will be contacted with results and recommendations.  Routine  preventative health maintenance measures emphasized.  Loralyn Rochester, MD Obstetrician & Gynecologist, [facility] for [employer], [facility]

## 2024-03-16 NOTE — Progress Notes (Signed)
 Pt states she has no questions or concerns at this time.

## 2024-03-16 NOTE — Patient Instructions (Signed)
It is normal to have cramping and bleeding for the next 2-3 days. You should feel better every day.  Please call us if you have any severe pain, bleeding that soaks more than 1 pad in a hour, have fevers, or feel like you're going to pass out.  You can take tylenol 1000mg every 8 hours and ibuprofen 600mg every 8 hours as needed for pain. It is ok to take both at the same time.   

## 2024-03-20 LAB — SURGICAL PATHOLOGY

## 2024-03-21 ENCOUNTER — Encounter: Payer: Self-pay | Admitting: Obstetrics and Gynecology

## 2024-03-22 ENCOUNTER — Other Ambulatory Visit: Payer: Self-pay

## 2024-03-22 ENCOUNTER — Other Ambulatory Visit

## 2024-03-22 DIAGNOSIS — B20 Human immunodeficiency virus [HIV] disease: Secondary | ICD-10-CM | POA: Diagnosis not present

## 2024-03-22 DIAGNOSIS — Z79899 Other long term (current) drug therapy: Secondary | ICD-10-CM | POA: Diagnosis not present

## 2024-03-23 ENCOUNTER — Ambulatory Visit (HOSPITAL_COMMUNITY): Admission: RE | Admit: 2024-03-23 | Source: Ambulatory Visit

## 2024-03-23 LAB — T-HELPER CELL (CD4) - (RCID CLINIC ONLY)
CD4 % Helper T Cell: 35 % (ref 33–65)
CD4 T Cell Abs: 715 /uL (ref 400–1790)

## 2024-03-24 LAB — HIV-1 RNA QUANT-NO REFLEX-BLD
HIV 1 RNA Quant: <20 {copies}/mL — AB
HIV-1 RNA Quant, Log: <1.3 {Log_copies}/mL — AB

## 2024-03-24 LAB — BASIC METABOLIC PANEL WITH GFR
BUN: 10 mg/dL (ref 7–25)
CO2: 33 mmol/L — ABNORMAL HIGH (ref 20–32)
Calcium: 10.3 mg/dL (ref 8.6–10.4)
Chloride: 100 mmol/L (ref 98–110)
Creat: 0.64 mg/dL (ref 0.50–1.03)
Glucose, Bld: 135 mg/dL — ABNORMAL HIGH (ref 65–99)
Potassium: 3.9 mmol/L (ref 3.5–5.3)
Sodium: 139 mmol/L (ref 135–146)
eGFR: 104 mL/min/{1.73_m2} (ref >=60)

## 2024-03-24 LAB — CBC WITH DIFFERENTIAL/PLATELET
Absolute Lymphocytes: 2385 {cells}/uL (ref 850–3900)
Absolute Monocytes: 330 {cells}/uL (ref 200–950)
Basophils Absolute: 60 {cells}/uL (ref 0–200)
Basophils Relative: 1.2 %
Eosinophils Absolute: 190 {cells}/uL (ref 15–500)
Eosinophils Relative: 3.8 %
HCT: 41.4 % (ref 35.0–45.0)
Hemoglobin: 13.8 g/dL (ref 11.7–15.5)
MCH: 29.2 pg (ref 27.0–33.0)
MCHC: 33.3 g/dL (ref 32.0–36.0)
MCV: 87.5 fL (ref 80.0–100.0)
MPV: 10.2 fL (ref 7.5–12.5)
Monocytes Relative: 6.6 %
Neutro Abs: 2035 {cells}/uL (ref 1500–7800)
Neutrophils Relative %: 40.7 %
Platelets: 339 10*3/uL (ref 140–400)
RBC: 4.73 10*6/uL (ref 3.80–5.10)
RDW: 12.1 % (ref 11.0–15.0)
Total Lymphocyte: 47.7 %
WBC: 5 10*3/uL (ref 3.8–10.8)

## 2024-03-24 LAB — LIPID PANEL
Cholesterol: 186 mg/dL (ref <200)
HDL: 76 mg/dL (ref >=50)
LDL Cholesterol (Calc): 93 mg/dL
Non-HDL Cholesterol (Calc): 110 mg/dL (ref <130)
Total CHOL/HDL Ratio: 2.4 (calc) (ref <5.0)
Triglycerides: 83 mg/dL (ref <150)

## 2024-03-24 LAB — RPR: RPR Ser Ql: NONREACTIVE

## 2024-04-05 ENCOUNTER — Ambulatory Visit (INDEPENDENT_AMBULATORY_CARE_PROVIDER_SITE_OTHER): Payer: Self-pay | Admitting: Internal Medicine

## 2024-04-05 ENCOUNTER — Encounter: Payer: Self-pay | Admitting: Internal Medicine

## 2024-04-05 ENCOUNTER — Other Ambulatory Visit: Payer: Self-pay

## 2024-04-05 VITALS — BP 115/77 | HR 77 | Temp 98.2°F | Wt 198.0 lb

## 2024-04-05 DIAGNOSIS — K219 Gastro-esophageal reflux disease without esophagitis: Secondary | ICD-10-CM | POA: Diagnosis not present

## 2024-04-05 DIAGNOSIS — R140 Abdominal distension (gaseous): Secondary | ICD-10-CM

## 2024-04-05 DIAGNOSIS — B20 Human immunodeficiency virus [HIV] disease: Secondary | ICD-10-CM | POA: Diagnosis not present

## 2024-04-05 DIAGNOSIS — Z0000 Encounter for general adult medical examination without abnormal findings: Secondary | ICD-10-CM

## 2024-04-05 DIAGNOSIS — Z79899 Other long term (current) drug therapy: Secondary | ICD-10-CM

## 2024-04-05 MED ORDER — OMEPRAZOLE 20 MG PO CPDR: 20.0000 mg | DELAYED_RELEASE_CAPSULE | Freq: Every day | ORAL | 11 refills | Status: DC

## 2024-04-05 MED ORDER — BIKTARVY 50-200-25 MG PO TABS: 1.0000 | ORAL_TABLET | Freq: Every day | ORAL | 5 refills | Status: AC

## 2024-04-05 NOTE — Progress Notes (Unsigned)
 RFV: follow up for hiv disease  Patient ID: Leslie Gates, female   DOB: 28-Jul-1966, 58 y.o.   MRN: [id number]  HPI Leslie Gates is a58yo F with hiv disease, on biktarvy .  Cd 4 count of 715/VL<20 on may 2025 labs. Excellent adherence. Doing well overall.   Had mammo and pap- LSIL/CIN1 on path  Outpatient Encounter Medications as of 04/05/2024  Medication Sig   Accu-Chek Softclix Lancets lancets USE TO TEST BLOOD SUGAR FOUR TIMES DAILY AS DIRECTED   amLODipine  (NORVASC ) 10 MG tablet TAKE ONE TABLET BY MOUTH DAILY   atorvastatin  (LIPITOR) 40 MG tablet TAKE ONE TABLET BY MOUTH DAILY - NEEDS APPT FOR FUTURE REFILLS-   BIKTARVY  50-200-25 MG TABS tablet TAKE ONE TABLET BY MOUTH DAILY (NEEDS OFFICE VISIT FOR FURTHER REFILLS)   blood glucose meter kit and supplies KIT Dispense based on patient and insurance preference. Use up to four times daily as directed.   cetirizine  (ZYRTEC ) 10 MG tablet TAKE ONE TABLET BY MOUTH EVERY DAY   furosemide  (LASIX ) 20 MG tablet TAKE ONE TABLET BY MOUTH EVERY DAY -MAY TAKE EXTRA TABLET EVERY DAY AS NEEDED FOR WEIGHT GAIN OF 3 LBS OR MORE OVERNIGHT   glucose blood (ACCU-CHEK AVIVA PLUS) test strip USE TO CHECK BLOOD SUGAR FOUR TIMES DAILY   insulin  glargine (LANTUS  SOLOSTAR) 100 UNIT/ML Solostar Pen Inject 8 Units into the skin daily.   Insulin  Pen Needle 31G X 5 MM MISC 1 each by Does not apply route daily.   Multiple Vitamin (TAB-A-VITE) TABS TAKE ONE TABLET BY MOUTH EVERY DAY   OXYGEN  Inhale 2 L into the lungs at bedtime.   spironolactone  (ALDACTONE ) 25 MG tablet TAKE ONE-HALF TABLET BY MOUTH NIGHTLY AT BEDTIME   TRULICITY  3 MG/0.5ML SOAJ INJECT ONE PEN (THREE MG) SUBCUTANEOUSLY ONCE A WEEK   VENTOLIN  HFA 108 (90 Base) MCG/ACT inhaler INHALE TWO PUFFS INTO THE LUNGS EVERY 6 HOURS FOR WHEEZING OR SHORTNESS OF BREATH   NAPHCON-A  0.025-0.3 % ophthalmic solution INSTILL 1 DROP INTO BOTH EYES FOUR TIMES A DAY AS NEEDED FOR IRRITATION (Patient not taking: No sig  reported)   omeprazole  (PRILOSEC) 40 MG capsule TAKE ONE CAPSULE BY MOUTH EVERY DAY (Patient not taking: Reported on 03/14/2024)   No facility-administered encounter medications on file as of 04/05/2024.     Patient Active Problem List   Diagnosis Date Noted   Hypoglycemic event in diabetes (HCC) 04/29/2023   Seasonal allergies 08/10/2022   Healthcare maintenance 04/10/2022   Type 2 diabetes mellitus with diabetic neuropathy, unspecified (HCC) 03/13/2022   Dysplasia of cervix, low grade (CIN 1) 11/13/2020   Type 2 diabetes mellitus with hyperglycemia (HCC) 12/15/2019   Hyperlipidemia associated with type 2 diabetes mellitus (HCC) 04/29/2018   Chronic diastolic heart failure (HCC) 06/25/2017   Moderate dysplasia of cervix 07/10/2013   History of angioedema 09/09/2011   Obesity hypoventilation syndrome (HCC) 07/10/2010   Obesity, Class I, BMI 30-34.9 07/01/2010   Obstructive sleep apnea 06/27/2010   Pulmonary hypertension (HCC) 05/28/2010   Hypertension associated with type 2 diabetes mellitus (HCC) 03/22/2007   HIV disease (HCC) 06/21/2002     Health Maintenance Due  Topic Date Due   Zoster Vaccines- Shingrix (1 of 2) Never done   COVID-19 Vaccine (3 - Pfizer risk series) 04/24/2020   OPHTHALMOLOGY EXAM  12/20/2021   FOOT EXAM  03/14/2023   Pneumococcal Vaccine 63-80 Years old (4 of 4 - PCV20 or PCV21) 04/30/2024     Review of Systems Review of  Systems  Constitutional: Negative for fever, chills, diaphoresis, activity change, appetite change, fatigue and unexpected weight change.  HENT: Negative for congestion, sore throat, rhinorrhea, sneezing, trouble swallowing and sinus pressure.  Eyes: Negative for photophobia and visual disturbance.  Respiratory: Negative for cough, chest tightness, shortness of breath, wheezing and stridor.  Cardiovascular: Negative for chest pain, palpitations and leg swelling.  Gastrointestinal: Negative for nausea, vomiting, abdominal pain, diarrhea,  constipation, blood in stool, abdominal distention and anal bleeding.  Genitourinary: Negative for dysuria, hematuria, flank pain and difficulty urinating.  Musculoskeletal: Negative for myalgias, back pain, joint swelling, arthralgias and gait problem.  Skin: Negative for color change, pallor, rash and wound.  Neurological: Negative for dizziness, tremors, weakness and light-headedness.  Hematological: Negative for adenopathy. Does not bruise/bleed easily.  Psychiatric/Behavioral: Negative for behavioral problems, confusion, sleep disturbance, dysphoric mood, decreased concentration and agitation.   Physical Exam   BP 115/77   Pulse 77   Temp 98.2 F (36.8 C) (Oral)   Wt 198 lb (89.8 kg)   LMP 12/05/2021   SpO2 91%   BMI 35.07 kg/m   Physical Exam  Constitutional:  oriented to person, place, and time. appears well-developed and well-nourished. No distress.  HENT: [place]/AT, PERRLA, no scleral icterus Mouth/Throat: Oropharynx is clear and moist. No oropharyngeal exudate.  Cardiovascular: Normal rate, regular rhythm and normal heart sounds. Exam reveals no gallop and no friction rub.  No murmur heard.  Pulmonary/Chest: Effort normal and breath sounds normal. No respiratory distress.  has no wheezes.  Neck = supple, no nuchal rigidity Abdominal: Soft. Bowel sounds are normal.  exhibits no distension. There is no tenderness.  Lymphadenopathy: no cervical adenopathy. No axillary adenopathy Neurological: alert and oriented to person, place, and time.  Skin: Skin is warm and dry. No rash noted. No erythema.  Psychiatric: a normal mood and affect.  behavior is normal.   Lab Results  Component Value Date   CD4TCELL 35 03/22/2024   Lab Results  Component Value Date   CD4TABS 715 03/22/2024   CD4TABS 584 08/31/2023   CD4TABS 577 03/04/2023   Lab Results  Component Value Date   HIV1RNAQUANT <20 DETECTED (A) 03/22/2024   Lab Results  Component Value Date   HEPBSAB NEG 02/08/2009    Lab Results  Component Value Date   LABRPR NON-REACTIVE 03/22/2024    CBC Lab Results  Component Value Date   WBC 5.0 03/22/2024   RBC 4.73 03/22/2024   HGB 13.8 03/22/2024   HCT 41.4 03/22/2024   PLT 339 03/22/2024   MCV 87.5 03/22/2024   MCH 29.2 03/22/2024   MCHC 33.3 03/22/2024   RDW 12.1 03/22/2024   LYMPHSABS 1,983 03/04/2023   MONOABS 0.3 04/01/2022   EOSABS 190 03/22/2024    BMET Lab Results  Component Value Date   NA 139 03/22/2024   K 3.9 03/22/2024   CL 100 03/22/2024   CO2 33 (H) 03/22/2024   GLUCOSE 135 (H) 03/22/2024   BUN 10 03/22/2024   CREATININE 0.64 03/22/2024   CALCIUM  10.3 03/22/2024   GFRNONAA >60 04/03/2022   GFRAA 96 02/20/2021      Assessment and Plan  Hiv disease= well controlled, cd 4 count 715, VL <20 -- as noted on recent labs. Continue with great adherence. We will give refills of biktarvy   Long term medication management = cr is stable  Gerd = do a trial of omeprazole   Bloating = do a trial gasex

## 2024-04-05 NOTE — Patient Instructions (Signed)
Mental Health Resources  988: can call or text 24/7  [facility]: Address: [address], [place], [place] [address] Open 24 hours Phone: [phone]  [facility] [place]: Address: [address], [place], [place] [address] Phone: [phone] Appointments: [email or web address]

## 2024-04-09 ENCOUNTER — Other Ambulatory Visit: Payer: Self-pay | Admitting: Student

## 2024-04-09 DIAGNOSIS — I5032 Chronic diastolic (congestive) heart failure: Secondary | ICD-10-CM

## 2024-04-09 DIAGNOSIS — E782 Mixed hyperlipidemia: Secondary | ICD-10-CM

## 2024-04-13 ENCOUNTER — Ambulatory Visit: Payer: Self-pay | Admitting: Obstetrics and Gynecology

## 2024-04-13 ENCOUNTER — Ambulatory Visit: Admitting: Student

## 2024-04-16 DIAGNOSIS — G4733 Obstructive sleep apnea (adult) (pediatric): Secondary | ICD-10-CM | POA: Diagnosis not present

## 2024-04-16 DIAGNOSIS — B20 Human immunodeficiency virus [HIV] disease: Secondary | ICD-10-CM | POA: Diagnosis not present

## 2024-04-16 DIAGNOSIS — R062 Wheezing: Secondary | ICD-10-CM | POA: Diagnosis not present

## 2024-04-16 DIAGNOSIS — R0902 Hypoxemia: Secondary | ICD-10-CM | POA: Diagnosis not present

## 2024-05-02 ENCOUNTER — Ambulatory Visit: Admitting: Pharmacist

## 2024-05-11 ENCOUNTER — Ambulatory Visit: Admitting: Obstetrics and Gynecology

## 2024-05-16 DIAGNOSIS — R062 Wheezing: Secondary | ICD-10-CM | POA: Diagnosis not present

## 2024-05-16 DIAGNOSIS — B20 Human immunodeficiency virus [HIV] disease: Secondary | ICD-10-CM | POA: Diagnosis not present

## 2024-05-16 DIAGNOSIS — R0902 Hypoxemia: Secondary | ICD-10-CM | POA: Diagnosis not present

## 2024-05-16 DIAGNOSIS — G4733 Obstructive sleep apnea (adult) (pediatric): Secondary | ICD-10-CM | POA: Diagnosis not present

## 2024-06-16 DIAGNOSIS — G4733 Obstructive sleep apnea (adult) (pediatric): Secondary | ICD-10-CM | POA: Diagnosis not present

## 2024-06-16 DIAGNOSIS — R0902 Hypoxemia: Secondary | ICD-10-CM | POA: Diagnosis not present

## 2024-06-16 DIAGNOSIS — B20 Human immunodeficiency virus [HIV] disease: Secondary | ICD-10-CM | POA: Diagnosis not present

## 2024-06-16 DIAGNOSIS — R062 Wheezing: Secondary | ICD-10-CM | POA: Diagnosis not present

## 2024-07-17 DIAGNOSIS — B20 Human immunodeficiency virus [HIV] disease: Secondary | ICD-10-CM | POA: Diagnosis not present

## 2024-07-17 DIAGNOSIS — G4733 Obstructive sleep apnea (adult) (pediatric): Secondary | ICD-10-CM | POA: Diagnosis not present

## 2024-07-17 DIAGNOSIS — R0902 Hypoxemia: Secondary | ICD-10-CM | POA: Diagnosis not present

## 2024-07-17 DIAGNOSIS — R062 Wheezing: Secondary | ICD-10-CM | POA: Diagnosis not present

## 2024-07-21 ENCOUNTER — Other Ambulatory Visit: Payer: Self-pay | Admitting: Student

## 2024-07-21 DIAGNOSIS — I10 Essential (primary) hypertension: Secondary | ICD-10-CM

## 2024-08-07 ENCOUNTER — Ambulatory Visit: Admitting: Internal Medicine

## 2024-08-16 DIAGNOSIS — B20 Human immunodeficiency virus [HIV] disease: Secondary | ICD-10-CM | POA: Diagnosis not present

## 2024-08-16 DIAGNOSIS — R062 Wheezing: Secondary | ICD-10-CM | POA: Diagnosis not present

## 2024-08-16 DIAGNOSIS — R0902 Hypoxemia: Secondary | ICD-10-CM | POA: Diagnosis not present

## 2024-08-19 ENCOUNTER — Other Ambulatory Visit: Payer: Self-pay | Admitting: Student

## 2024-08-19 DIAGNOSIS — J302 Other seasonal allergic rhinitis: Secondary | ICD-10-CM

## 2024-08-21 DIAGNOSIS — Z794 Long term (current) use of insulin: Secondary | ICD-10-CM | POA: Diagnosis not present

## 2024-08-21 DIAGNOSIS — F331 Major depressive disorder, recurrent, moderate: Secondary | ICD-10-CM | POA: Diagnosis not present

## 2024-08-21 DIAGNOSIS — E662 Morbid (severe) obesity with alveolar hypoventilation: Secondary | ICD-10-CM | POA: Diagnosis not present

## 2024-08-21 DIAGNOSIS — J4540 Moderate persistent asthma, uncomplicated: Secondary | ICD-10-CM | POA: Diagnosis not present

## 2024-08-21 DIAGNOSIS — E1169 Type 2 diabetes mellitus with other specified complication: Secondary | ICD-10-CM | POA: Diagnosis not present

## 2024-08-24 ENCOUNTER — Telehealth: Payer: Self-pay

## 2024-08-24 NOTE — Telephone Encounter (Signed)
-----   Message from Gladis Church sent at 08/24/2024 12:51 PM EDT ----- Regarding: Needs appt Please call and schedule appt for diabetes follow-up needs A1C checked.

## 2024-08-24 NOTE — Telephone Encounter (Signed)
 Contacted the patient to get her scheduled for a Diabetes f/u /A1C check with PCP but patient stated that she would like to switch providers because she feels that her current pcp is not trying to help her and that they don't see  eye to eye informed patient that she can come into the office to fill out a form to change her pcp.

## 2024-08-25 ENCOUNTER — Other Ambulatory Visit: Payer: Self-pay | Admitting: Family Medicine

## 2024-08-25 DIAGNOSIS — E1165 Type 2 diabetes mellitus with hyperglycemia: Secondary | ICD-10-CM

## 2024-08-31 ENCOUNTER — Telehealth: Payer: Self-pay | Admitting: Pharmacist

## 2024-08-31 NOTE — Telephone Encounter (Signed)
 Patient contacted for follow-up of diabetes control and follow-up appointment  Since last contact patient reports CGM is not working and has been nonadherent with insulin  due to depression. She would like some nutritional support in addition to diabetes management.  Appointment scheduled for 10/22 at 0900.  Total time with patient call and documentation of interaction: 9 minutes.

## 2024-09-04 ENCOUNTER — Other Ambulatory Visit

## 2024-09-04 ENCOUNTER — Other Ambulatory Visit: Payer: Self-pay

## 2024-09-04 DIAGNOSIS — Z113 Encounter for screening for infections with a predominantly sexual mode of transmission: Secondary | ICD-10-CM

## 2024-09-04 DIAGNOSIS — B20 Human immunodeficiency virus [HIV] disease: Secondary | ICD-10-CM

## 2024-09-05 ENCOUNTER — Encounter: Payer: Self-pay | Admitting: Infectious Disease

## 2024-09-05 LAB — T-HELPER CELL (CD4) - (RCID CLINIC ONLY)
CD4 % Helper T Cell: 35 % (ref 33–65)
CD4 T Cell Abs: 412 /uL (ref 400–1790)

## 2024-09-05 NOTE — Progress Notes (Signed)
 This is a follow-up regarding your recent blood glucose reading, which was critically elevated (nearly 600 mg/dL). Triage attempted to  by phone and also contacted your emergency contact, unable to reach or leave a voice message. Will route message to PCP and send patient mychart message.   Enis Kleine, LPN

## 2024-09-05 NOTE — Progress Notes (Signed)
 I was called re this patients labs and a critical lab cause of blood sugar of 541 page came in at 1232 and it took me more than 10 mins of waiting. I called the patient who was undoubtedly asleep and did not pick up. She also does not have VM set up so I could not leave her a message

## 2024-09-05 NOTE — Telephone Encounter (Signed)
 Spoke to patient. With a reminder of her appt tomorrow with Dr. Koval. She stated that she is aware of her appt. Nelson Land, CMA

## 2024-09-06 ENCOUNTER — Ambulatory Visit: Admitting: Pharmacist

## 2024-09-06 ENCOUNTER — Encounter: Payer: Self-pay | Admitting: Pharmacist

## 2024-09-06 VITALS — BP 133/88 | HR 82 | Wt 194.0 lb

## 2024-09-06 DIAGNOSIS — E1165 Type 2 diabetes mellitus with hyperglycemia: Secondary | ICD-10-CM | POA: Diagnosis not present

## 2024-09-06 DIAGNOSIS — Z794 Long term (current) use of insulin: Secondary | ICD-10-CM

## 2024-09-06 LAB — COMPLETE METABOLIC PANEL WITHOUT GFR
AG Ratio: 1.4 (calc) (ref 1.0–2.5)
ALT: 13 U/L (ref 6–29)
AST: 14 U/L (ref 10–35)
Albumin: 4.2 g/dL (ref 3.6–5.1)
Alkaline phosphatase (APISO): 110 U/L (ref 37–153)
BUN: 12 mg/dL (ref 7–25)
CO2: 32 mmol/L (ref 20–32)
Calcium: 10.1 mg/dL (ref 8.6–10.4)
Chloride: 94 mmol/L — ABNORMAL LOW (ref 98–110)
Creat: 0.72 mg/dL (ref 0.50–1.03)
Globulin: 3.1 g/dL (ref 1.9–3.7)
Glucose, Bld: 541 mg/dL (ref 65–99)
Potassium: 4.7 mmol/L (ref 3.5–5.3)
Sodium: 134 mmol/L — ABNORMAL LOW (ref 135–146)
Total Bilirubin: 0.5 mg/dL (ref 0.2–1.2)
Total Protein: 7.3 g/dL (ref 6.1–8.1)

## 2024-09-06 LAB — CBC WITH DIFFERENTIAL/PLATELET
Absolute Lymphocytes: 1332 {cells}/uL (ref 850–3900)
Absolute Monocytes: 344 {cells}/uL (ref 200–950)
Basophils Absolute: 52 {cells}/uL (ref 0–200)
Basophils Relative: 1.3 %
Eosinophils Absolute: 72 {cells}/uL (ref 15–500)
Eosinophils Relative: 1.8 %
HCT: 42 % (ref 35.0–45.0)
Hemoglobin: 13.9 g/dL (ref 11.7–15.5)
MCH: 30 pg (ref 27.0–33.0)
MCHC: 33.1 g/dL (ref 32.0–36.0)
MCV: 90.7 fL (ref 80.0–100.0)
MPV: 11.4 fL (ref 7.5–12.5)
Monocytes Relative: 8.6 %
Neutro Abs: 2200 {cells}/uL (ref 1500–7800)
Neutrophils Relative %: 55 %
Platelets: 268 Thousand/uL (ref 140–400)
RBC: 4.63 Million/uL (ref 3.80–5.10)
RDW: 12.1 % (ref 11.0–15.0)
Total Lymphocyte: 33.3 %
WBC: 4 Thousand/uL (ref 3.8–10.8)

## 2024-09-06 LAB — RPR: RPR Ser Ql: NONREACTIVE

## 2024-09-06 LAB — HIV-1 RNA QUANT-NO REFLEX-BLD
HIV 1 RNA Quant: 1290 {copies}/mL — ABNORMAL HIGH
HIV-1 RNA Quant, Log: 3.11 {Log_copies}/mL — ABNORMAL HIGH

## 2024-09-06 MED ORDER — TRULICITY 0.75 MG/0.5ML ~~LOC~~ SOAJ: 0.7500 mg | SUBCUTANEOUS | 1 refills | Status: DC

## 2024-09-06 MED ORDER — INSULIN GLARGINE 100 UNIT/ML SOLOSTAR PEN: 10.0000 [IU] | PEN_INJECTOR | Freq: Every day | SUBCUTANEOUS | Status: AC

## 2024-09-06 NOTE — Patient Instructions (Signed)
 It was nice to see you today!  Your goal blood sugar is 80-130 before eating and less than 180 after eating.  Medication Changes: RESTART Trulicity  (dulaglutide ) 0.75 mg once weekly, and we will increase your dose back to your old dose.  RESTART Lantus  (insulin  glargine) 10 units once daily.  Continue all other medication the same.  Monitor blood sugars at home and keep a log (glucometer or piece of paper) to bring with you to your next visit.  Keep up the good work with diet and exercise. Aim for a diet full of vegetables, fruit and lean meats (chicken, [place], fish). Try to limit salt intake by eating fresh or frozen vegetables (instead of canned), rinse canned vegetables prior to cooking and do not add any additional salt to meals.

## 2024-09-06 NOTE — Progress Notes (Signed)
 S:     Chief Complaint  Patient presents with   Medication Management    DM    58 y.o. female who presents for diabetes evaluation, education, and management. Patient arrives in good spirits and presents without any assistance.  Patient denies adherence with medications, reports not taking her insulin  in quite a while and not checking sugars at home. Pt reports spotty adherence with her oral medications, reporting that she chooses which pill to take on certain days. Pt has not taken her Biktarvy  or Trulicity  (dulaglutide ) in around a month.  Patient was last seen by me on 03/14/24.  At last visit, current medication regimen was continued and adherence was encouraged.   At pt's ID appointment recently, pt's BG was noted to be 541 mg/dL.  PMH is significant for HIV, T2DM, hypertension, OSA, hyperlipidemia    Currently prescribed medications:  Current diabetes medications include: Lantus  (insulin  glargine) 10 units, trulicity  3 mg once weekly. Current hypertension medications include: amlodipine  10 mg Current hyperlipidemia medications include: atorvastatin  40 mg  Patient denies hypoglycemic events.  Patient reports nocturia (nighttime urination).  Patient denies neuropathy (nerve pain). Patient has eye appointment soon. Patient reports self foot exams.   PHQ-9 Score: 7   O:   Review of Systems  All other systems reviewed and are negative.   Physical Exam Constitutional:      Appearance: Normal appearance.  Neurological:     Mental Status: She is alert.  Psychiatric:        Mood and Affect: Mood normal.        Behavior: Behavior normal.        Thought Content: Thought content normal.        Judgment: Judgment normal.     Lab Results  Component Value Date   HGBA1C >15.0 01/14/2024   Vitals:   09/06/24 0958  BP: 133/88  Pulse: 82  SpO2: 93%    Lipid Panel     Component Value Date/Time   CHOL 186 03/22/2024 0938   CHOL 156 03/13/2022 1321   TRIG  83 03/22/2024 0938   HDL 76 03/22/2024 0938   HDL 69 03/13/2022 1321   CHOLHDL 2.4 03/22/2024 0938   VLDL 28 03/30/2017 1555   LDLCALC 93 03/22/2024 0938   LDLDIRECT 144 (H) 01/06/2021 1514    Clinical Atherosclerotic Cardiovascular Disease (ASCVD): No  The 10-year ASCVD risk score (Arnett DK, et al., 2019) is: 10.6%   Values used to calculate the score:     Age: 64 years     Clincally relevant sex: Female     Is Non-Hispanic African American: Yes     Diabetic: Yes     Tobacco smoker: No     Systolic Blood Pressure: 133 mmHg     Is BP treated: Yes     HDL Cholesterol: 76 mg/dL     Total Cholesterol: 186 mg/dL    A/P: Diabetes longstanding currently uncontrolled. Patient is able to verbalize appropriate hypoglycemia management plan. Medication adherence appears low. Control is suboptimal due to medication non-adherence, with patient reporting she had not taken her insulin  in a while. -Restarted basal insulin  Lantus /Basaglar /Semglee  (insulin  glargine) 10 units daily in the morning.  Medication Samples have been provided to the patient.  Drug name: Basaglar        Strength: 100 unit pen        Qty: 1  LOT: [id number]  Exp.Date: 10/31/24 Dosing instructions: inject 10 units subcutaneously once daily  The patient has been  instructed regarding the correct time, dose, and frequency of taking this medication, including desired effects and most common side effects.   Leslie Gates 10:37 AM 09/06/2024  -Restarted GLP-1 Trulicity  (dulaglutide ) 0.75 mg, plan to increase back to 3mg   -Extensively discussed pathophysiology of diabetes, recommended lifestyle interventions, dietary effects on blood sugar control.  -Counseled on s/sx of and management of hypoglycemia.   ASCVD risk - primary prevention in patient with diabetes. Last LDL is 93 not at goal of <70 mg/dL. ASCVD risk factors include DM, hyperlipidemia, HTN and 10-year ASCVD risk score of 9.9%. high intensity statin indicated.   -Restarted atorvastatin  40 mg.   Hypertension longstanding currently uncontrolled. Blood pressure goal of <130/80 mmHg. Medication adherence inconsistent. Blood pressure control is suboptimal due to medication nonadherence. -Restarted amlodipine  10 mg.  Written patient instructions provided. Patient verbalized understanding of treatment plan.  Total time in face to face counseling 42 minutes.    Follow-up:  Pharmacist 09/20/24 @ 0930 Patient seen with Lawson Mao, PharmD Candidate - PY3 student and Belvie Macintosh, PharmD - PY4 Candidate.

## 2024-09-06 NOTE — Assessment & Plan Note (Addendum)
 Diabetes longstanding currently uncontrolled. Patient is able to verbalize appropriate hypoglycemia management plan. Medication adherence appears low. Control is suboptimal due to medication non-adherence, with patient reporting she had not taken her insulin  in a while. -Restarted basal insulin  Lantus /Basaglar  (insulin  glargine) 10 units daily in the morning.  Medication Samples Basaglar  (insulin  glargine) have been provided to the patient. - Restarted GLP-1 Trulicity  (dulaglutide ) 0.75 mg, plan to increase back to 3mg   -Extensively discussed pathophysiology of diabetes, recommended lifestyle interventions, dietary effects on blood sugar control.  -Counseled on s/sx of and management of hypoglycemia.

## 2024-09-06 NOTE — Progress Notes (Signed)
 Reviewed and agree with Dr Rennis plan.

## 2024-09-07 ENCOUNTER — Ambulatory Visit (INDEPENDENT_AMBULATORY_CARE_PROVIDER_SITE_OTHER): Admitting: Internal Medicine

## 2024-09-07 ENCOUNTER — Encounter: Payer: Self-pay | Admitting: Internal Medicine

## 2024-09-07 ENCOUNTER — Other Ambulatory Visit: Payer: Self-pay

## 2024-09-07 VITALS — BP 123/88 | HR 88 | Temp 97.7°F | Resp 16 | Wt 194.0 lb

## 2024-09-07 DIAGNOSIS — E1165 Type 2 diabetes mellitus with hyperglycemia: Secondary | ICD-10-CM | POA: Diagnosis not present

## 2024-09-07 DIAGNOSIS — Z794 Long term (current) use of insulin: Secondary | ICD-10-CM | POA: Diagnosis not present

## 2024-09-07 DIAGNOSIS — F320 Major depressive disorder, single episode, mild: Secondary | ICD-10-CM

## 2024-09-07 DIAGNOSIS — Z0000 Encounter for general adult medical examination without abnormal findings: Secondary | ICD-10-CM | POA: Diagnosis not present

## 2024-09-07 DIAGNOSIS — B20 Human immunodeficiency virus [HIV] disease: Secondary | ICD-10-CM | POA: Diagnosis not present

## 2024-09-07 LAB — BASIC METABOLIC PANEL WITH GFR
BUN/Creatinine Ratio: 18 (ref 9–23)
BUN: 13 mg/dL (ref 6–24)
CO2: 26 mmol/L (ref 20–29)
Calcium: 10.7 mg/dL — ABNORMAL HIGH (ref 8.7–10.2)
Chloride: 93 mmol/L — ABNORMAL LOW (ref 96–106)
Creatinine, Ser: 0.74 mg/dL (ref 0.57–1.00)
Glucose: 394 mg/dL — ABNORMAL HIGH (ref 70–99)
Potassium: 4.8 mmol/L (ref 3.5–5.2)
Sodium: 135 mmol/L (ref 134–144)
eGFR: 94 mL/min/1.73 (ref >59)

## 2024-09-07 NOTE — Progress Notes (Signed)
 RFV: follow up for hiv disease Patient ID: Leslie Gates, female   DOB: 11/04/1966, 58 y.o.   MRN: [id number]  HPI Leslie Gates, is a 58yo F with poorly controlled hiv disease, and poorly controlled T2DM She was off of her lantus  insulin  for a while, and now re-started just 10/21. Had been off of lantus  for 2 months. Back on lantus  10 units per day; trulicity  once every firday.(3mg )  Had been off of biktarvy  for 3  months --- due to depression -- if I got to live, I got to fight. She now is feeling more committed to her health and getting back onto her HIV regimen.  Just restarted 2 days ago being back on biktarvy , cd 4 count 412 / VL 1290  Outpatient Encounter Medications as of 09/07/2024  Medication Sig   Accu-Chek Softclix Lancets lancets USE TO TEST BLOOD SUGAR FOUR TIMES DAILY AS DIRECTED   amLODipine  (NORVASC ) 10 MG tablet TAKE ONE TABLET BY MOUTH DAILY   atorvastatin  (LIPITOR) 40 MG tablet TAKE ONE TABLET BY MOUTH DAILY (NEEDS OFFICE VISIT FOR FURTHER REFILLS)   bictegravir-emtricitabine -tenofovir  AF (BIKTARVY ) 50-200-25 MG TABS tablet Take 1 tablet by mouth daily.   blood glucose meter kit and supplies KIT Dispense based on patient and insurance preference. Use up to four times daily as directed.   budesonide -glycopyrrolate-formoterol  (BREZTRI  AEROSPHERE) 160-9-4.8 MCG/ACT AERO inhaler Inhale 2 puffs into the lungs 2 (two) times daily.   Dulaglutide  (TRULICITY ) 0.75 MG/0.5ML SOAJ Inject 0.75 mg into the skin once a week.   furosemide  (LASIX ) 20 MG tablet TAKE ONE TABLET BY MOUTH EVERY DAY -MAY TAKE EXTRA TABLET EVERY DAY AS NEEDED FOR WEIGHT GAIN OF 3 LBS OR MORE OVERNIGHT   glucose blood (ACCU-CHEK AVIVA PLUS) test strip USE TO CHECK BLOOD SUGAR FOUR TIMES DAILY   insulin  glargine (LANTUS ) 100 UNIT/ML Solostar Pen Inject 10 Units into the skin daily.   Insulin  Pen Needle 31G X 5 MM MISC 1 each by Does not apply route daily.   Multiple Vitamin (TAB-A-VITE) TABS TAKE ONE TABLET BY  MOUTH EVERY DAY   OXYGEN  Inhale 2 L into the lungs at bedtime.   spironolactone  (ALDACTONE ) 25 MG tablet TAKE ONE-HALF TABLET BY MOUTH NIGHTLY AT BEDTIME   VENTOLIN  HFA 108 (90 Base) MCG/ACT inhaler INHALE TWO PUFFS INTO THE LUNGS EVERY 6 HOURS FOR WHEEZING OR SHORTNESS OF BREATH   cetirizine  (ZYRTEC ) 10 MG tablet TAKE ONE TABLET BY MOUTH EVERY DAY (Patient not taking: Reported on 09/07/2024)   NAPHCON-A  0.025-0.3 % ophthalmic solution INSTILL 1 DROP INTO BOTH EYES FOUR TIMES A DAY AS NEEDED FOR IRRITATION (Patient not taking: Reported on 09/07/2024)   omeprazole  (PRILOSEC) 20 MG capsule Take 1 capsule (20 mg total) by mouth daily. (Patient not taking: Reported on 09/07/2024)   omeprazole  (PRILOSEC) 40 MG capsule TAKE ONE CAPSULE BY MOUTH EVERY DAY (Patient not taking: Reported on 09/07/2024)   No facility-administered encounter medications on file as of 09/07/2024.     Patient Active Problem List   Diagnosis Date Noted   Hypoglycemic event in diabetes (HCC) 04/29/2023   Seasonal allergies 08/10/2022   Healthcare maintenance 04/10/2022   Type 2 diabetes mellitus with diabetic neuropathy, unspecified (HCC) 03/13/2022   Dysplasia of cervix, low grade (CIN 1) 11/13/2020   Type 2 diabetes mellitus with hyperglycemia (HCC) 12/15/2019   Hyperlipidemia associated with type 2 diabetes mellitus (HCC) 04/29/2018   Chronic diastolic heart failure (HCC) 06/25/2017   Moderate dysplasia of cervix 07/10/2013   History  of angioedema 09/09/2011   Obesity hypoventilation syndrome (HCC) 07/10/2010   Obesity, Class I, BMI 30-34.9 07/01/2010   Obstructive sleep apnea 06/27/2010   Pulmonary hypertension (HCC) 05/28/2010   Hypertension associated with type 2 diabetes mellitus (HCC) 03/22/2007   HIV disease (HCC) 06/21/2002     Health Maintenance Due  Topic Date Due   Zoster Vaccines- Shingrix (1 of 2) Never done   COVID-19 Vaccine (3 - Pfizer risk series) 04/24/2020   OPHTHALMOLOGY EXAM  10/03/2021    FOOT EXAM  03/14/2023   Pneumococcal Vaccine: 50+ Years (4 of 4 - PCV20 or PCV21) 04/30/2024   Influenza Vaccine  06/16/2024   HEMOGLOBIN A1C  07/13/2024     Review of Systems +depression, 12 point ros is otherwise negative Physical Exam   BP 123/88   Pulse 88   Temp 97.7 F (36.5 C) (Temporal)   Resp 16   Wt 194 lb (88 kg)   LMP 12/05/2021   SpO2 97%   BMI 34.37 kg/m   Physical Exam  Constitutional:  oriented to person, place, and time. appears well-developed and well-nourished. No distress.  HENT: [place]/AT, PERRLA, no scleral icterus Mouth/Throat: Oropharynx is clear and moist. No oropharyngeal exudate.  Cardiovascular: Normal rate, regular rhythm and normal heart sounds. Exam reveals no gallop and no friction rub.  No murmur heard.  Pulmonary/Chest: Effort normal and breath sounds normal. No respiratory distress.  has no wheezes.  Neck = supple, no nuchal rigidity Lymphadenopathy: no cervical adenopathy. No axillary adenopathy Neurological: alert and oriented to person, place, and time.  Skin: Skin is warm and dry. No rash noted. No erythema.  Psychiatric: a normal mood and affect.  behavior is normal.   Lab Results  Component Value Date   CD4TCELL 35 09/04/2024   Lab Results  Component Value Date   CD4TABS 412 09/04/2024   CD4TABS 715 03/22/2024   CD4TABS 584 08/31/2023   Lab Results  Component Value Date   HIV1RNAQUANT 1,290 (H) 09/04/2024   Lab Results  Component Value Date   HEPBSAB NEG 02/08/2009   Lab Results  Component Value Date   LABRPR NON-REACTIVE 09/04/2024    CBC Lab Results  Component Value Date   WBC 4.0 09/04/2024   RBC 4.63 09/04/2024   HGB 13.9 09/04/2024   HCT 42.0 09/04/2024   PLT 268 09/04/2024   MCV 90.7 09/04/2024   MCH 30.0 09/04/2024   MCHC 33.1 09/04/2024   RDW 12.1 09/04/2024   LYMPHSABS 1,983 03/04/2023   MONOABS 0.3 04/01/2022   EOSABS 72 09/04/2024    BMET Lab Results  Component Value Date   NA 135 09/06/2024    K 4.8 09/06/2024   CL 93 (L) 09/06/2024   CO2 26 09/06/2024   GLUCOSE 394 (H) 09/06/2024   BUN 13 09/06/2024   CREATININE 0.74 09/06/2024   CALCIUM  10.7 (H) 09/06/2024   GFRNONAA >60 04/03/2022   GFRAA 96 02/20/2021      Assessment and Plan Hiv disease = detectable,not well controlled, due to being off for 3 months. Restarted on biktarvy   Hyperglycemia, poorly controlled T2DM == getting restarted on her insulin  and trulicity , anticipate BS trending better in the coming week.  Health maintenance =Already had hpv; decline flu and covid  Depression = counseling offered and interested in the clinic counselor. Not ready for any SSRI  RTC in 4 wk

## 2024-09-08 ENCOUNTER — Other Ambulatory Visit (HOSPITAL_COMMUNITY): Payer: Self-pay

## 2024-09-08 ENCOUNTER — Telehealth: Payer: Self-pay

## 2024-09-08 NOTE — Telephone Encounter (Signed)
 Rec'd PA request for patients Trulicity .   Awaiting clinical questions  Key [id number]

## 2024-09-08 NOTE — Telephone Encounter (Signed)
 Pharmacy Patient Advocate Encounter  Received notification from [place] MEDICAID that Prior Authorization for TRULICITY  0.75MG  has been APPROVED from 09/08/24 to 09/08/25   PA #/Case ID/Reference #: [id number]

## 2024-09-08 NOTE — Telephone Encounter (Signed)
 Prior authorization submitted for TRULICITY  0.75MG  to [facility] MEDICAID via Latent.   Key: [id number]

## 2024-09-11 ENCOUNTER — Ambulatory Visit: Payer: Self-pay | Admitting: Pharmacist

## 2024-09-11 NOTE — Telephone Encounter (Signed)
 Patient contacted for follow-up of lab results that showed continued elevated glucose prior to restarting insulin  last week.  Patient now reports home glucose readings are in the 200s.   Patient also reports much improved adherence with her pills.   Since last contact patient reports she has not been able to find her information for CPAP visit or follow-up for pick-up of new equipment.  I will attempt to inform patient care CMA in provider office to assist if possible with facilitating visit/pick-up. Attempted to call [facility] to facilitate CPAP.  Asked for facilitation of device pick-up with [facility], Dr. Magdaleno office - they agreed to contact patient and communicate pick-up location for CPAP.   No change in diabetes treatment plan today.  Encouraged patient adherence to continue.   Total time with patient call and documentation of interaction: 12 minutes.

## 2024-09-11 NOTE — Telephone Encounter (Signed)
-----   Message from [facility] McDiarmid sent at 09/07/2024  8:26 AM EDT ----- See Dr Rennis note for intervention for elevated serum glucose. ----- Message ----- From: Rebecka Memos Lab Results In Sent: 09/07/2024   8:15 AM EDT To: Krystal BIRCH McDiarmid, MD

## 2024-09-12 ENCOUNTER — Ambulatory Visit (INDEPENDENT_AMBULATORY_CARE_PROVIDER_SITE_OTHER): Admitting: Podiatry

## 2024-09-12 DIAGNOSIS — L600 Ingrowing nail: Secondary | ICD-10-CM | POA: Diagnosis not present

## 2024-09-12 NOTE — Progress Notes (Signed)
 Reviewed and agree with Dr Rennis plan.

## 2024-09-12 NOTE — Progress Notes (Signed)
 Patient presents with complaint and concern about dark area on the medial border of the hallux and central aspect of the nail hallux left.  Does not remember any injury to it.  She is a diabetic and so was concerned about it.  Has not noticed any drainage from it.  Is not painful.  Says she has suddenly noticed it several weeks ago.   Physical exam:  General appearance: Pleasant, and in no acute distress. AOx3.  Vascular: Pedal pulses: DP 2/4 bilaterally, PT 2/4 bilaterally.  Moderate edema lower legs bilaterally. Capillary fill time immediate bilaterally.  Neurological: Light touch intact feet bilaterally.  Normal Achilles reflex bilaterally.  No clonus or spasticity noted.  Diminished vibratory sensation in feet bilaterally  Dermatologic:   There are dark subungual hematoma along the lateral border of the hallux nail left and middle part of the nail.  No tenderness with pressure.  No drainage noted.  No erythema..  Skin normal temperature bilaterally.  Skin normal color, tone, and texture bilaterally.   Musculoskeletal: Normal muscle strength lower extremity bilateral   Diagnosis: 1.  Ingrown nail medial border hallux left  Plan: -New patient office visit for evaluation and management level 3. - Discussed with the ingrown nail and subungual hematoma.  Border of the nail was trimmed back in the office today.  There was no signs of infection or break in the skin..  Was able to curette out some of the dried blood from end of the nail plate.  No signs of infection or foreign body.  Instructed to soak it once or twice a day in warm Epsom salt water for 10 or 15 minutes.  Should be fine in the dark area in the central aspect of the nail should gradually grow out over the next several months.  If she has any concerned about it is or notices any drainage she should call for an appointment.   Return prn

## 2024-09-16 DIAGNOSIS — R062 Wheezing: Secondary | ICD-10-CM | POA: Diagnosis not present

## 2024-09-16 DIAGNOSIS — G4733 Obstructive sleep apnea (adult) (pediatric): Secondary | ICD-10-CM | POA: Diagnosis not present

## 2024-09-16 DIAGNOSIS — R0902 Hypoxemia: Secondary | ICD-10-CM | POA: Diagnosis not present

## 2024-09-16 DIAGNOSIS — B20 Human immunodeficiency virus [HIV] disease: Secondary | ICD-10-CM | POA: Diagnosis not present

## 2024-09-18 DIAGNOSIS — E662 Morbid (severe) obesity with alveolar hypoventilation: Secondary | ICD-10-CM | POA: Diagnosis not present

## 2024-09-20 ENCOUNTER — Ambulatory Visit: Admitting: Pharmacist

## 2024-09-20 ENCOUNTER — Encounter: Payer: Self-pay | Admitting: Pharmacist

## 2024-09-20 VITALS — BP 136/85 | HR 79

## 2024-09-20 DIAGNOSIS — E785 Hyperlipidemia, unspecified: Secondary | ICD-10-CM | POA: Diagnosis not present

## 2024-09-20 DIAGNOSIS — E1159 Type 2 diabetes mellitus with other circulatory complications: Secondary | ICD-10-CM | POA: Diagnosis not present

## 2024-09-20 DIAGNOSIS — E1169 Type 2 diabetes mellitus with other specified complication: Secondary | ICD-10-CM | POA: Diagnosis not present

## 2024-09-20 DIAGNOSIS — E1165 Type 2 diabetes mellitus with hyperglycemia: Secondary | ICD-10-CM | POA: Diagnosis not present

## 2024-09-20 DIAGNOSIS — I152 Hypertension secondary to endocrine disorders: Secondary | ICD-10-CM | POA: Diagnosis not present

## 2024-09-20 DIAGNOSIS — Z794 Long term (current) use of insulin: Secondary | ICD-10-CM | POA: Diagnosis not present

## 2024-09-20 MED ORDER — TRULICITY 1.5 MG/0.5ML ~~LOC~~ SOAJ
1.5000 mg | SUBCUTANEOUS | 3 refills | Status: AC
Start: 2024-09-20 — End: ?

## 2024-09-20 NOTE — Progress Notes (Signed)
 S:     Chief Complaint  Patient presents with   Medication Management    Diabetes management   58 y.o. female who presents for diabetes evaluation, education, and management. Patient arrives in good spirits and presents without  any assistance.   Patient was referred and last seen by Primary Care Provider, Dr. Howell, on 03/01/2024.  At last pharmacy visit, patient expressed concern of her health and was depressed so was not taking her medications.  PMH is significant for HIV, T2DM, hypertension, OSA, hyperlipidemia     Currently prescribed medications:  Current diabetes medications include: Lantus  (insulin  glargine) 10 units, trulicity  0.75 mg once weekly. Current hypertension medications include: amlodipine  10 mg Current hyperlipidemia medications include: atorvastatin  40 mg  Patient denies adherence with medications, reports missing few doses of medications per week, on average.  Do you feel that your medications are working for you? no Have you been experiencing any side effects to the medications prescribed? no Do you have any problems obtaining medications due to transportation or finances? no Insurance coverage: [place] Medicaid  Patient denies hypoglycemic events.  Reported home fasting blood sugars: mid to high 200s   Patient denies nocturia (nighttime urination).  Patient denies neuropathy (nerve pain). Patient reports visual changes. Patient reports self foot exams.   Patient reported dietary habits: Eats 3 meals/day Breakfast: eggs, oatmeal Lunch: turkey sandwiches, flatbread Dinner: cabbage, meatloaf, steak Snacks: garlic parmesan chips,  Drinks: cheer wine, water   Patient-reported exercise habits: walks dog around the block.  O:   Review of Systems  All other systems reviewed and are negative.   Physical Exam Vitals reviewed.  Constitutional:      Appearance: Normal appearance.  Neurological:     Mental Status: She is alert.  Psychiatric:         Mood and Affect: Mood normal.        Behavior: Behavior normal.        Thought Content: Thought content normal.        Judgment: Judgment normal.    Lab Results  Component Value Date   HGBA1C >15.0 01/14/2024   Vitals:   09/20/24 0924 09/20/24 0925  BP: (!) 150/94 136/85  Pulse: 79   SpO2: 99%     Lipid Panel     Component Value Date/Time   CHOL 186 03/22/2024 0938   CHOL 156 03/13/2022 1321   TRIG 83 03/22/2024 0938   HDL 76 03/22/2024 0938   HDL 69 03/13/2022 1321   CHOLHDL 2.4 03/22/2024 0938   VLDL 28 03/30/2017 1555   LDLCALC 93 03/22/2024 0938   LDLDIRECT 144 (H) 01/06/2021 1514    Clinical Atherosclerotic Cardiovascular Disease (ASCVD): No  The 10-year ASCVD risk score (Arnett DK, et al., 2019) is: 11.4%   Values used to calculate the score:     Age: 45 years     Clincally relevant sex: Female     Is Non-Hispanic African American: Yes     Diabetic: Yes     Tobacco smoker: No     Systolic Blood Pressure: 136 mmHg     Is BP treated: Yes     HDL Cholesterol: 76 mg/dL     Total Cholesterol: 186 mg/dL   Patient is participating in a Managed Medicaid Plan:  Yes   A/P: Diabetes longstanding  currently uncontrolled. Patient is able to verbalize appropriate hypoglycemia management plan. Medication adherence appears good. Control is suboptimal due to suboptimal medication regimen. Patient reports that she has been  taking insulin  and Trulicity  (dulaglutide ) and has been adherent and reports average glucose readings between 215-275. -Increased dose of basal insulin  Lantus  (insulin  glargine) from 10 units to 14 units daily  -Increased dose of GLP-1 Trulicity  (dulaglutide )  from 0.75 mg to 1.5 mg weekly.  -Patient educated on purpose, proper use, and potential adverse effects.  -Extensively discussed pathophysiology of diabetes, recommended lifestyle interventions, dietary effects on blood sugar control.  -Counseled on s/sx of and management of hypoglycemia.  -Consider  restarting LIBRE3 glucose sensor at next visit  ASCVD risk - primary prevention in patient with diabetes. Last LDL is 93 not at goal of <70  mg/dL. ASCVD risk factors include diabetes high intensity statin indicated. Patient reports not being adherent to statin.  -Continued Atorvastatin  40 mg and counseled on importance of cholesterol control.   Hypertension longstanding  currently uncontrolled with in office reading of 136/85 and reports of being nonadherent to medication. Blood pressure goal of <130/80  mmHg. Medication adherence poor. Blood pressure control is suboptimal due to lack of adherence and not understanding importance of medications. Reports that she cannot tolerate 25 mg of spironolactone  but is able to tolerate half tablet.  -Continued amlodipine  10 mg daily -Continued Spironolactone  25 mg half tablet daily -Counseled patient on importance of Blood Pressure control and patient verbalized understanding.  Written patient instructions provided. Patient verbalized understanding of treatment plan.  Total time in face to face counseling 36 minutes.    Follow-up:  Pharmacist 10/04/24 Patient seen with Lawson Mao, PharmD Candidate - PY3 student and [employer] PharmD - PY4 Candidate.

## 2024-09-20 NOTE — Patient Instructions (Addendum)
 It was nice to see you today!  Your goal blood sugar is 80-130 before eating and less than 180 after eating.  Medication Changes: Increase Lantus  (insulin  glargine) from 10 to 14 units daily Increase Trulicity  (dulaglutide ) from 0.75 to 1.5 mg weekly.   Continue all other medication the same.   Monitor blood sugars at home and keep a log (glucometer or piece of paper) to bring with you to your next visit.  Keep up the good work with diet and exercise. Aim for a diet full of vegetables, fruit and lean meats (chicken, turkey, fish). Try to limit salt intake by eating fresh or frozen vegetables (instead of canned), rinse canned vegetables prior to cooking and do not add any additional salt to meals.

## 2024-09-20 NOTE — Assessment & Plan Note (Addendum)
 Diabetes longstanding  currently uncontrolled. Patient is able to verbalize appropriate hypoglycemia management plan. Medication adherence appears good. Control is suboptimal due to suboptimal medication regimen. Patient reports that she has been taking insulin  and Trulicity  (dulaglutide ) and has been adherent and reports average glucose readings between 215-275. -Increased dose of basal insulin  Lantus  (insulin  glargine) from 10 units to 14 units daily  -Increased dose of GLP-1 Trulicity  (dulaglutide )  from 0.75 mg to 1.5 mg weekly.  -Patient educated on purpose, proper use, and potential adverse effects.  -Extensively discussed pathophysiology of diabetes, recommended lifestyle interventions, dietary effects on blood sugar control.  -Counseled on s/sx of and management of hypoglycemia.  -Consider restarting LIBRE3 glucose sensor at next visit

## 2024-09-20 NOTE — Progress Notes (Signed)
 Reviewed and agree with Dr Rennis plan.

## 2024-09-20 NOTE — Assessment & Plan Note (Signed)
 Hypertension longstanding  currently uncontrolled with in office reading of 136/85 and reports of being nonadherent to medication. Blood pressure goal of <130/80  mmHg. Medication adherence poor. Blood pressure control is suboptimal due to lack of adherence and not understanding importance of medications. Reports that she cannot tolerate 25 mg of spironolactone  but is able to tolerate half tablet.  -Continued amlodipine  10 mg daily -Continued Spironolactone  25 mg half tablet daily -Counseled patient on importance of Blood Pressure control and patient verbalized understanding.

## 2024-09-20 NOTE — Assessment & Plan Note (Signed)
 ASCVD risk - primary prevention in patient with diabetes. Last LDL is 93 not at goal of <70  mg/dL. ASCVD risk factors include diabetes high intensity statin indicated. Patient reports not being adherent to statin.  -Continued Atorvastatin  40 mg and counseled on importance of cholesterol control.

## 2024-09-21 DIAGNOSIS — E662 Morbid (severe) obesity with alveolar hypoventilation: Secondary | ICD-10-CM | POA: Diagnosis not present

## 2024-09-21 DIAGNOSIS — G4733 Obstructive sleep apnea (adult) (pediatric): Secondary | ICD-10-CM | POA: Diagnosis not present

## 2024-09-25 ENCOUNTER — Ambulatory Visit (INDEPENDENT_AMBULATORY_CARE_PROVIDER_SITE_OTHER): Admitting: Pharmacist

## 2024-09-25 ENCOUNTER — Encounter: Payer: Self-pay | Admitting: Pharmacist

## 2024-09-25 VITALS — BP 140/87 | HR 76 | Wt 196.0 lb

## 2024-09-25 DIAGNOSIS — Z794 Long term (current) use of insulin: Secondary | ICD-10-CM | POA: Diagnosis not present

## 2024-09-25 DIAGNOSIS — E1165 Type 2 diabetes mellitus with hyperglycemia: Secondary | ICD-10-CM | POA: Diagnosis not present

## 2024-09-25 MED ORDER — FREESTYLE LIBRE 3 PLUS SENSOR MISC: 11 refills | Status: AC

## 2024-09-25 NOTE — Progress Notes (Signed)
    S:     Chief Complaint  Patient presents with   Medication Management    Diabetes follow-up for CGM initiation   58 y.o. female who presents for diabetes evaluation, education, and management. Patient arrives in good spirits and presents without any assistance. Reported that she wants to start on CGM.   O:  Review of Systems  All other systems reviewed and are negative.   Physical Exam Constitutional:      Appearance: Normal appearance.  Pulmonary:     Effort: Pulmonary effort is normal.  Neurological:     Mental Status: She is alert.  Psychiatric:        Mood and Affect: Mood normal.        Behavior: Behavior normal.        Thought Content: Thought content normal.        Judgment: Judgment normal.    Lab Results  Component Value Date   HGBA1C >15.0 01/14/2024   Vitals:   09/25/24 1029 09/25/24 1030  BP: (!) 144/91 (!) 140/87  Pulse: 76   SpO2: 100%     Lipid Panel     Component Value Date/Time   CHOL 186 03/22/2024 0938   CHOL 156 03/13/2022 1321   TRIG 83 03/22/2024 0938   HDL 76 03/22/2024 0938   HDL 69 03/13/2022 1321   CHOLHDL 2.4 03/22/2024 0938   VLDL 28 03/30/2017 1555   LDLCALC 93 03/22/2024 0938   LDLDIRECT 144 (H) 01/06/2021 1514   Clinical Atherosclerotic Cardiovascular Disease (ASCVD): No  The 10-year ASCVD risk score (Arnett DK, et al., 2019) is: 12.5%   Values used to calculate the score:     Age: 36 years     Clincally relevant sex: Female     Is Non-Hispanic African American: Yes     Diabetic: Yes     Tobacco smoker: No     Systolic Blood Pressure: 140 mmHg     Is BP treated: Yes     HDL Cholesterol: 76 mg/dL     Total Cholesterol: 186 mg/dL   Patient is participating in a Managed Medicaid Plan:  Yes   A/P: Diabetes longstanding currently uncontrolled. Medication adherence appears good. Patient said she wants to start on CGM to avoid fingerstick daily.  - Initiate Libre 3 Plus CGM. Given sample with receiver at the clinic.   - Prescription for sensor has been sent to pharmacy. Prior Auth required. - Patient to bring receiver to our next appointment  - Continue all current antidiabetic regimen.    Written patient instructions provided. Patient verbalized understanding of treatment plan.  Total time in face to face counseling 30 minutes.    Follow-up:  Pharmacist 10/04/2024 Patient seen with Lawson Mao, PharmD Candidate - PY3 student and [employer] PharmD - PY4 Candidate.

## 2024-09-25 NOTE — Assessment & Plan Note (Addendum)
 Diabetes longstanding currently uncontrolled. Medication adherence appears good. Patient said she wants to start on CGM to avoid fingerstick daily.  - Initiate Libre 3 Plus CGM. Given sample with receiver at the clinic.  - Prescription for sensor has been sent to pharmacy. Prior Auth required. - Patient to bring receiver to our next appointment  - Continue all current antidiabetic regimen.

## 2024-09-25 NOTE — Patient Instructions (Signed)
 It was nice to see you today!  Your goal blood sugar is 80-130 before eating and less than 180 after eating.  Medication Changes: START Libre 3 Plus Sensor. Bring receiver to next appointment for blood glucose review.   Continue all other medication the same.   Monitor blood sugars at home and keep a log (glucometer or piece of paper) to bring with you to your next visit.  Keep up the good work with diet and exercise. Aim for a diet full of vegetables, fruit and lean meats (chicken, turkey, fish). Try to limit salt intake by eating fresh or frozen vegetables (instead of canned), rinse canned vegetables prior to cooking and do not add any additional salt to meals.

## 2024-09-28 ENCOUNTER — Other Ambulatory Visit (HOSPITAL_COMMUNITY): Payer: Self-pay

## 2024-09-28 ENCOUNTER — Telehealth: Payer: Self-pay

## 2024-09-28 DIAGNOSIS — H53022 Refractive amblyopia, left eye: Secondary | ICD-10-CM | POA: Diagnosis not present

## 2024-09-28 DIAGNOSIS — E119 Type 2 diabetes mellitus without complications: Secondary | ICD-10-CM | POA: Diagnosis not present

## 2024-09-28 DIAGNOSIS — H25813 Combined forms of age-related cataract, bilateral: Secondary | ICD-10-CM | POA: Diagnosis not present

## 2024-09-28 LAB — OPHTHALMOLOGY REPORT-SCANNED

## 2024-09-28 NOTE — Telephone Encounter (Signed)
 Prior authorization submitted for FREESTYLE LIBRE 3 PLUS SENSORS to [facility] MEDICAID via Latent.   Key: [id number]

## 2024-09-29 NOTE — Telephone Encounter (Signed)
 Pharmacy Patient Advocate Encounter  Received notification from [facility] MEDICAID that Prior Authorization for FREESTYLE LIBRE 3 PLUS SENSORS has been APPROVED from 09/28/24 to 03/28/25   PA #/Case ID/Reference #: [id number]

## 2024-09-29 NOTE — Progress Notes (Signed)
 Diabetes Self-Management Education  Visit Type: First/Initial  Appt. Start Time: 0845 Appt. End Time: 0945  10/06/2024  [name], identified by name and date of birth, is a 58 y.o. female with a diagnosis of Diabetes: Type 2.   ASSESSMENT  Patient presents today alone with CGM reader. Patient would like to learn learn to eat better for diabetes. Patient lives with her adult son and Pt does all the shopping and cooking.  Pt reports avoiding sugary sweetened beverages stating d/c intake 5 months ago  and was congratulated on her success. Pt reports limiting eating out to 2 times monthly. Pt reports using CGM stating fasting blood sugar today about 130 this morning before breakfast. Pt reports after meals over 200. Pt reports taking 14 Lantus  daily when I remember stating no missed doses-RD encouraged aiming for the same hour daily. Pt states an interest in meal planning. Pt reports she is sedentary and has access to a gym, likes walking and has treadmill at home. All Pt's questions were answered during this encounter.   History includes:   Past Medical History:  Diagnosis Date   Allergic rhinitis    Allergy    Anemia    Anxiety    Arthritis    Asthma    Chronic otitis media    Coronary artery disease    Depression    Diabetes mellitus without complication (HCC)    Diastolic CHF, chronic (HCC)    Dysphagia    Genital warts 11/13/2020   GERD (gastroesophageal reflux disease)    History of cervical dysplasia    History of herpes genitalis 11/13/2020   History of herpes zoster    HTN (hypertension)    Human immunodeficiency virus (HIV) (HCC) 2001   Hyperlipidemia    Obesity    OSA (obstructive sleep apnea)    Oxygen  deficiency    Pulmonary HTN (HCC)    Sleep apnea    Wrist fracture, bilateral     Medications include:   Current Outpatient Medications:    Dulaglutide  (TRULICITY ) 1.5 MG/0.5ML SOAJ, Inject 1.5 mg into the skin once a week., Disp: 2 mL,  Rfl: 3   Accu-Chek Softclix Lancets lancets, USE TO TEST BLOOD SUGAR FOUR TIMES DAILY AS DIRECTED, Disp: 100 each, Rfl: 3   amLODipine  (NORVASC ) 10 MG tablet, TAKE ONE TABLET BY MOUTH DAILY, Disp: 90 tablet, Rfl: 3   atorvastatin  (LIPITOR) 40 MG tablet, TAKE ONE TABLET BY MOUTH DAILY (NEEDS OFFICE VISIT FOR FURTHER REFILLS) (Patient taking differently: Take 40 mg by mouth daily. TAKE ONE TABLET BY MOUTH DAILY (NEEDS OFFICE VISIT FOR FURTHER REFILLS)), Disp: 90 tablet, Rfl: 3   bictegravir-emtricitabine -tenofovir  AF (BIKTARVY ) 50-200-25 MG TABS tablet, Take 1 tablet by mouth daily., Disp: 30 tablet, Rfl: 5   blood glucose meter kit and supplies KIT, Dispense based on patient and insurance preference. Use up to four times daily as directed., Disp: 1 each, Rfl: 0   budesonide -glycopyrrolate-formoterol  (BREZTRI  AEROSPHERE) 160-9-4.8 MCG/ACT AERO inhaler, Inhale 2 puffs into the lungs 2 (two) times daily. (Patient not taking: Reported on 09/20/2024), Disp: , Rfl:    Continuous Glucose Sensor (FREESTYLE LIBRE 3 PLUS SENSOR) MISC, Change sensor every 15 days., Disp: 2 each, Rfl: 11   furosemide  (LASIX ) 20 MG tablet, TAKE ONE TABLET BY MOUTH EVERY DAY -MAY TAKE EXTRA TABLET EVERY DAY AS NEEDED FOR WEIGHT GAIN OF 3 LBS OR MORE OVERNIGHT, Disp: 30 tablet, Rfl: 3   glucose blood (ACCU-CHEK AVIVA PLUS) test strip, USE TO CHECK  BLOOD SUGAR FOUR TIMES DAILY, Disp: 100 each, Rfl: 12   insulin  glargine (LANTUS ) 100 UNIT/ML Solostar Pen, Inject 10 Units into the skin daily., Disp: , Rfl:    Insulin  Pen Needle 31G X 5 MM MISC, 1 each by Does not apply route daily., Disp: 100 each, Rfl: 3   Multiple Vitamin (TAB-A-VITE) TABS, TAKE ONE TABLET BY MOUTH EVERY DAY, Disp: 30 tablet, Rfl: 11   OXYGEN , Inhale 2 L into the lungs at bedtime., Disp: , Rfl:    spironolactone  (ALDACTONE ) 25 MG tablet, TAKE ONE-HALF TABLET BY MOUTH NIGHTLY AT BEDTIME (Patient not taking: Reported on 09/20/2024), Disp: 90 tablet, Rfl: 5   VENTOLIN  HFA  108 (90 Base) MCG/ACT inhaler, INHALE TWO PUFFS INTO THE LUNGS EVERY 6 HOURS FOR WHEEZING OR SHORTNESS OF BREATH, Disp: 18 g, Rfl: 5   Labs noted:   Lab Results  Component Value Date   CHOL 186 03/22/2024   HDL 76 03/22/2024   LDLCALC 93 03/22/2024   LDLDIRECT 144 (H) 01/06/2021   TRIG 83 03/22/2024   CHOLHDL 2.4 03/22/2024   Lab Results  Component Value Date   HGBA1C >15.0 01/14/2024   Wt Readings from Last 3 Encounters:  09/25/24 196 lb (88.9 kg)  09/07/24 194 lb (88 kg)  09/06/24 194 lb (88 kg)     CGM Results from download:   % Time CGM active:   79 %   (Goal >70%)  Average glucose:   160 mg/dL for 14 days  Time in range (70-180 mg/dL):   72 %   (Goal >29%)  Time High (>181 mg/dL):   82 %   (Goal < 74%)  Time Low (54-69 mg/dL):   0 %   (Goal <5%)  Time Very Low (<54 mg/dL):   0 %   (Goal <8%)   Last menstrual period 12/05/2021. There is no height or weight on file to calculate BMI.   Diabetes Self-Management Education - 10/06/24 0855       Visit Information   Visit Type First/Initial      Initial Visit   Diabetes Type Type 2    Date Diagnosed 2-3 years    Are you currently following a meal plan? No    Are you taking your medications as prescribed? Yes      Health Coping   How would you rate your overall health? Fair      Psychosocial Assessment   Patient Belief/Attitude about Diabetes Afraid    What is the hardest part about your diabetes right now, causing you the most concern, or is the most worrisome to you about your diabetes?   Getting support / problem solving;Making healty food and beverage choices    Self-care barriers None    Self-management support Doctor's office    Other persons present Patient    Patient Concerns Nutrition/Meal planning;Problem Solving    Special Needs Unable to determine    Preferred Learning Style No preference indicated    How often do you need to have someone help you when you read instructions, pamphlets, or other  written materials from your doctor or pharmacy? 1 - Never    What is the last grade level you completed in school? 12th      Pre-Education Assessment   Patient understands the diabetes disease and treatment process. Needs Instruction    Patient understands incorporating nutritional management into lifestyle. Needs Instruction    Patient undertands incorporating physical activity into lifestyle. Needs Instruction    Patient understands  using medications safely. Needs Instruction    Patient understands monitoring blood glucose, interpreting and using results Needs Instruction    Patient understands prevention, detection, and treatment of acute complications. Needs Instruction    Patient understands prevention, detection, and treatment of chronic complications. Needs Instruction    Patient understands how to develop strategies to address psychosocial issues. Needs Instruction    Patient understands how to develop strategies to promote health/change behavior. Needs Instruction      Complications   Last HgB A1C per patient/outside source 15 %    How often do you check your blood sugar? > 4 times/day    Fasting Blood glucose range (mg/dL) 29-870    Postprandial Blood glucose range (mg/dL) >799    Number of hypoglycemic episodes per month 0    Number of hyperglycemic episodes ( >200mg /dL): Daily    Can you tell when your blood sugar is high? No    Have you had a dilated eye exam in the past 12 months? Yes    Have you had a dental exam in the past 12 months? Yes    Are you checking your feet? Yes    How many days per week are you checking your feet? 7      Dietary Intake   Breakfast 5 crackers, water    Lunch ~12pm: baked chicken, kale, water    Dinner ~ 6pm: noodles, baked chicken, peas, water    Beverage(s) water      Activity / Exercise   Activity / Exercise Type ADL's      Patient Education   Previous Diabetes Education No    Disease Pathophysiology Definition of diabetes, type 1  and 2, and the diagnosis of diabetes    Healthy Eating Plate Method;Reviewed blood glucose goals for pre and post meals and how to evaluate the patients' food intake on their blood glucose level.;Food label reading, portion sizes and measuring food.;Role of diet in the treatment of diabetes and the relationship between the three main macronutrients and blood glucose level;Carbohydrate counting    Being Active Role of exercise on diabetes management, blood pressure control and cardiac health.    Medications Reviewed patients medication for diabetes, action, purpose, timing of dose and side effects.    Monitoring Daily foot exams;Yearly dilated eye exam;Identified appropriate SMBG and/or A1C goals.;Taught/evaluated CGM (comment)    Acute complications Taught prevention, symptoms, and  treatment of hypoglycemia - the 15 rule.    Chronic complications Retinopathy and reason for yearly dilated eye exams;Relationship between chronic complications and blood glucose control;Assessed and discussed foot care and prevention of foot problems    Diabetes Stress and Support Worked with patient to identify barriers to care and solutions;Identified and addressed patients feelings and concerns about diabetes;Role of stress on diabetes    Preconception care --   n/a   Lifestyle and Health Coping Lifestyle issues that need to be addressed for better diabetes care      Individualized Goals (developed by patient)   Nutrition Follow meal plan discussed    Medications take my medication as prescribed    Monitoring  Test my blood glucose as discussed    Problem Solving Eating Pattern    Reducing Risk do foot checks daily;treat hypoglycemia with 15 grams of carbs if blood glucose less than 70mg /dL    Health Coping Ask for help with psychological, social, or emotional issues      Post-Education Assessment   Patient understands the diabetes disease and treatment process. Needs  Review    Patient understands incorporating  nutritional management into lifestyle. Needs Review    Patient undertands incorporating physical activity into lifestyle. Needs Review    Patient understands using medications safely. Comphrehends key points    Patient understands monitoring blood glucose, interpreting and using results Comprehends key points    Patient understands prevention, detection, and treatment of acute complications. Needs Review    Patient understands prevention, detection, and treatment of chronic complications. Needs Review    Patient understands how to develop strategies to address psychosocial issues. Needs Review    Patient understands how to develop strategies to promote health/change behavior. Needs Review      Outcomes   Expected Outcomes Demonstrated limited interest in learning.  Expect minimal changes    Future DMSE 3-4 months    Program Status Not Completed          Individualized Plan for Diabetes Self-Management Training:   Learning Objective:  Patient will have a greater understanding of diabetes self-management. Patient education plan is to attend individual and/or group sessions per assessed needs and concerns.   Plan:   Patient Instructions  Aim to eat balanced meals on a schedule Aim to use treadmill 2-3 days weekly as tolerated  Expected Outcomes:  Demonstrated limited interest in learning.  Expect minimal changes  Education material provided: ADA - How to Thrive: A Guide for Your Journey with Diabetes, Food label handouts, My Plate, Snack sheet, and Diabetes Resources  If problems or questions, patient to contact team via:  Phone  Future DSME appointment: 3-4 months

## 2024-10-04 ENCOUNTER — Ambulatory Visit: Admitting: Pharmacist

## 2024-10-05 ENCOUNTER — Other Ambulatory Visit: Payer: Self-pay | Admitting: Student

## 2024-10-05 DIAGNOSIS — E1159 Type 2 diabetes mellitus with other circulatory complications: Secondary | ICD-10-CM

## 2024-10-05 DIAGNOSIS — I5032 Chronic diastolic (congestive) heart failure: Secondary | ICD-10-CM

## 2024-10-06 ENCOUNTER — Encounter: Attending: Internal Medicine | Admitting: Dietician

## 2024-10-06 ENCOUNTER — Other Ambulatory Visit: Payer: Self-pay

## 2024-10-06 ENCOUNTER — Other Ambulatory Visit

## 2024-10-06 DIAGNOSIS — Z794 Long term (current) use of insulin: Secondary | ICD-10-CM | POA: Diagnosis present

## 2024-10-06 DIAGNOSIS — E1165 Type 2 diabetes mellitus with hyperglycemia: Secondary | ICD-10-CM | POA: Insufficient documentation

## 2024-10-06 DIAGNOSIS — B20 Human immunodeficiency virus [HIV] disease: Secondary | ICD-10-CM

## 2024-10-06 NOTE — Patient Instructions (Signed)
 Aim to eat balanced meals on a schedule Aim to use treadmill 2-3 days weekly as tolerated

## 2024-10-09 ENCOUNTER — Telehealth: Payer: Self-pay

## 2024-10-09 NOTE — Telephone Encounter (Signed)
 Reviewed and agree with Dr Rennis plan.

## 2024-10-09 NOTE — Telephone Encounter (Signed)
 Patient calls nurse line in regards to [facility].   She reports she has not received her shipment from [facility].   She reports the current one she has on now is going to need to be changed in the next few days.   She is requesting a sample if we have any.   I did call [facility]. They report her first shipment of this medication will go out on 12/3. She reports they will send #2 each month.   Will forward to pharmacy team for possible sample.

## 2024-10-09 NOTE — Telephone Encounter (Signed)
 Patient contacted for follow-up of request for CGM support  Since last contact patient reports glucose readings are doing well with most readings in the 100s, reports she had 1 reading ~ 95  Agreed to help with 1 sensor for coverage gap by insurance.  Sample sensors has been provided for patient pick-up.  Sueanne Maniaci 2:25 PM 10/09/2024   Medication Plan: - No Change - Continue the same.   Total time with patient call and documentation of interaction: 11 minutes.

## 2024-10-10 LAB — HIV-1 RNA QUANT-NO REFLEX-BLD
HIV 1 RNA Quant: NOT DETECTED {copies}/mL
HIV-1 RNA Quant, Log: NOT DETECTED {Log_copies}/mL

## 2024-10-11 ENCOUNTER — Ambulatory Visit: Admitting: Internal Medicine

## 2024-10-11 ENCOUNTER — Other Ambulatory Visit: Payer: Self-pay

## 2024-10-11 ENCOUNTER — Encounter: Payer: Self-pay | Admitting: Internal Medicine

## 2024-10-11 VITALS — BP 138/81 | HR 76 | Temp 98.4°F | Wt 197.0 lb

## 2024-10-11 DIAGNOSIS — Z794 Long term (current) use of insulin: Secondary | ICD-10-CM

## 2024-10-11 DIAGNOSIS — E1165 Type 2 diabetes mellitus with hyperglycemia: Secondary | ICD-10-CM

## 2024-10-11 DIAGNOSIS — Z79899 Other long term (current) drug therapy: Secondary | ICD-10-CM

## 2024-10-11 DIAGNOSIS — B20 Human immunodeficiency virus [HIV] disease: Secondary | ICD-10-CM

## 2024-10-11 NOTE — Progress Notes (Signed)
 RFV: follow up for hiv disease  Patient ID: Leslie Gates, female   DOB: 1966/02/19, 58 y.o.   MRN: [id number]  HPI Leslie Gates is a 58yo F who has HIV disease, she has Restarted on biktarvy  mid October-- VL on November 21st - back to being undetectable.she reports being back on track in terms of her mental health, engaged to continue on taking her medicines. No recent illnesses  since last visit.  Outpatient Encounter Medications as of 10/11/2024  Medication Sig   Accu-Chek Softclix Lancets lancets USE TO TEST BLOOD SUGAR FOUR TIMES DAILY AS DIRECTED   amLODipine  (NORVASC ) 10 MG tablet TAKE ONE TABLET BY MOUTH DAILY   atorvastatin  (LIPITOR) 40 MG tablet TAKE ONE TABLET BY MOUTH DAILY (NEEDS OFFICE VISIT FOR FURTHER REFILLS) (Patient taking differently: Take 40 mg by mouth daily. TAKE ONE TABLET BY MOUTH DAILY (NEEDS OFFICE VISIT FOR FURTHER REFILLS))   bictegravir-emtricitabine -tenofovir  AF (BIKTARVY ) 50-200-25 MG TABS tablet Take 1 tablet by mouth daily.   blood glucose meter kit and supplies KIT Dispense based on patient and insurance preference. Use up to four times daily as directed.   budesonide -glycopyrrolate-formoterol  (BREZTRI  AEROSPHERE) 160-9-4.8 MCG/ACT AERO inhaler Inhale 2 puffs into the lungs 2 (two) times daily.   Continuous Glucose Sensor (FREESTYLE LIBRE 3 PLUS SENSOR) MISC Change sensor every 15 days.   Dulaglutide  (TRULICITY ) 1.5 MG/0.5ML SOAJ Inject 1.5 mg into the skin once a week.   furosemide  (LASIX ) 20 MG tablet TAKE ONE TABLET BY MOUTH EVERY DAY -MAY TAKE EXTRA TABLET EVERY DAY AS NEEDED FOR WEIGHT GAIN OF 3 LBS OR MORE OVERNIGHT   glucose blood (ACCU-CHEK AVIVA PLUS) test strip USE TO CHECK BLOOD SUGAR FOUR TIMES DAILY   insulin  glargine (LANTUS ) 100 UNIT/ML Solostar Pen Inject 10 Units into the skin daily.   Insulin  Pen Needle 31G X 5 MM MISC 1 each by Does not apply route daily.   Multiple Vitamin (TAB-A-VITE) TABS TAKE ONE TABLET BY MOUTH EVERY DAY   OXYGEN   Inhale 2 L into the lungs at bedtime.   spironolactone  (ALDACTONE ) 25 MG tablet TAKE ONE-HALF TABLET BY MOUTH NIGHTLY AT BEDTIME   VENTOLIN  HFA 108 (90 Base) MCG/ACT inhaler INHALE TWO PUFFS INTO THE LUNGS EVERY 6 HOURS FOR WHEEZING OR SHORTNESS OF BREATH   No facility-administered encounter medications on file as of 10/11/2024.     Patient Active Problem List   Diagnosis Date Noted   Hypoglycemic event in diabetes (HCC) 04/29/2023   Seasonal allergies 08/10/2022   Healthcare maintenance 04/10/2022   Type 2 diabetes mellitus with diabetic neuropathy, unspecified (HCC) 03/13/2022   Dysplasia of cervix, low grade (CIN 1) 11/13/2020   Type 2 diabetes mellitus with hyperglycemia (HCC) 12/15/2019   Hyperlipidemia associated with type 2 diabetes mellitus (HCC) 04/29/2018   Chronic diastolic heart failure (HCC) 06/25/2017   Moderate dysplasia of cervix 07/10/2013   History of angioedema 09/09/2011   Obesity hypoventilation syndrome (HCC) 07/10/2010   Obesity, Class I, BMI 30-34.9 07/01/2010   Obstructive sleep apnea 06/27/2010   Pulmonary hypertension (HCC) 05/28/2010   Hypertension associated with type 2 diabetes mellitus (HCC) 03/22/2007   HIV disease (HCC) 06/21/2002     Health Maintenance Due  Topic Date Due   Zoster Vaccines- Shingrix (1 of 2) Never done   COVID-19 Vaccine (3 - Pfizer risk series) 04/24/2020   OPHTHALMOLOGY EXAM  10/03/2021   FOOT EXAM  03/14/2023   Pneumococcal Vaccine: 50+ Years (4 of 4 - PCV20 or PCV21) 04/30/2024   Influenza  Vaccine  06/16/2024   HEMOGLOBIN A1C  07/13/2024     Review of Systems Review of Systems  Constitutional: Negative for fever, chills, diaphoresis, activity change, appetite change, fatigue and unexpected weight change.  HENT: Negative for congestion, sore throat, rhinorrhea, sneezing, trouble swallowing and sinus pressure.  Eyes: Negative for photophobia and visual disturbance.  Respiratory: Negative for cough, chest tightness,  shortness of breath, wheezing and stridor.  Cardiovascular: Negative for chest pain, palpitations and leg swelling.  Gastrointestinal: Negative for nausea, vomiting, abdominal pain, diarrhea, constipation, blood in stool, abdominal distention and anal bleeding.  Genitourinary: Negative for dysuria, hematuria, flank pain and difficulty urinating.  Musculoskeletal: Negative for myalgias, back pain, joint swelling, arthralgias and gait problem.  Skin: Negative for color change, pallor, rash and wound.  Neurological: Negative for dizziness, tremors, weakness and light-headedness.  Hematological: Negative for adenopathy. Does not bruise/bleed easily.  Psychiatric/Behavioral: Negative for behavioral problems, confusion, sleep disturbance, dysphoric mood, decreased concentration and agitation.   Physical Exam   BP 138/81   Pulse 76   Temp 98.4 F (36.9 C) (Oral)   Wt 197 lb (89.4 kg)   LMP 12/05/2021   SpO2 96%   BMI 34.90 kg/m   Physical Exam  Constitutional:  oriented to person, place, and time. appears well-developed and well-nourished. No distress.  HENT: [place]/AT, PERRLA, no scleral icterus Mouth/Throat: Oropharynx is clear and moist. No oropharyngeal exudate.  Cardiovascular: Normal rate, regular rhythm and normal heart sounds. Exam reveals no gallop and no friction rub.  No murmur heard.  Pulmonary/Chest: Effort normal and breath sounds normal. No respiratory distress.  has no wheezes.  Neck = supple, no nuchal rigidity Abdominal: Soft. Bowel sounds are normal.  exhibits no distension. There is no tenderness.  Lymphadenopathy: no cervical adenopathy. No axillary adenopathy Neurological: alert and oriented to person, place, and time.  Skin: Skin is warm and dry. No rash noted. No erythema.  Psychiatric: a normal mood and affect.  behavior is normal.   Lab Results  Component Value Date   CD4TCELL 35 09/04/2024   Lab Results  Component Value Date   CD4TABS 412 09/04/2024   CD4TABS  715 03/22/2024   CD4TABS 584 08/31/2023   Lab Results  Component Value Date   HIV1RNAQUANT NOT DETECTED 10/06/2024   Lab Results  Component Value Date   HEPBSAB NEG 02/08/2009   Lab Results  Component Value Date   LABRPR NON-REACTIVE 09/04/2024    CBC Lab Results  Component Value Date   WBC 4.0 09/04/2024   RBC 4.63 09/04/2024   HGB 13.9 09/04/2024   HCT 42.0 09/04/2024   PLT 268 09/04/2024   MCV 90.7 09/04/2024   MCH 30.0 09/04/2024   MCHC 33.1 09/04/2024   RDW 12.1 09/04/2024   LYMPHSABS 1,983 03/04/2023   MONOABS 0.3 04/01/2022   EOSABS 72 09/04/2024    BMET Lab Results  Component Value Date   NA 135 09/06/2024   K 4.8 09/06/2024   CL 93 (L) 09/06/2024   CO2 26 09/06/2024   GLUCOSE 394 (H) 09/06/2024   BUN 13 09/06/2024   CREATININE 0.74 09/06/2024   CALCIUM  10.7 (H) 09/06/2024   GFRNONAA >60 04/03/2022   GFRAA 96 02/20/2021      Assessment and Plan HIV disease= back to being undetectable. Has improvement with her adherence with biktarvy , overall doing better Doing better. Will see back in 6 wk to do labs at that time  T2DM = poorly controlled, as can see with random BS at 394.  Encourage ot have better control of DM with medication adherence to this chronic illness as well  Long term medication management = cr still appears WNL with cr 0.74  Health maintenance=  Try to convince her to get pneumonia, flu

## 2024-10-18 ENCOUNTER — Ambulatory Visit: Admitting: Licensed Clinical Social Worker

## 2024-10-18 ENCOUNTER — Other Ambulatory Visit: Payer: Self-pay | Admitting: Student

## 2024-10-18 ENCOUNTER — Other Ambulatory Visit: Payer: Self-pay

## 2024-10-18 DIAGNOSIS — F4329 Adjustment disorder with other symptoms: Secondary | ICD-10-CM

## 2024-10-18 DIAGNOSIS — E662 Morbid (severe) obesity with alveolar hypoventilation: Secondary | ICD-10-CM | POA: Diagnosis not present

## 2024-10-18 DIAGNOSIS — G4733 Obstructive sleep apnea (adult) (pediatric): Secondary | ICD-10-CM | POA: Diagnosis not present

## 2024-10-18 NOTE — Progress Notes (Signed)
   THERAPIST PROGRESS NOTE  Session Time: 60 minutes  Participation Level: Active  Behavioral Response: Well GroomedAlertAnxious  Type of Therapy: Individual Therapy  Treatment Goals addressed: Client and therapist met for first session. Client reports feelings of depression and anxiety related to current environment and health situations.  ProgressTowards Goals: Initial  Interventions: Motivational Interviewing  Summary: Leslie Gates is a 58 y.o. female who presents with anxiety and depression.   Suicidal/Homicidal: denies SI/HI/AVH  Therapist Response: Client and therapist met to discuss current symptoms. Client reports anxiety and depression related to health, current environment, and social climate. Client reports she lives with her son and his father. Client reports worries about bills, her children, her health, living situation, etc. Client reports she is living day by day and tries to do things to help her stay busy. Client reports she likes to draw, color, go to the park, and do puzzles. Client reports wanting to participate in classes or activities that help her get out of the house more. Client reports she is struggling with her health, identity, and isolation. Client and therapist discussed her situation and possibly starting therapy. Client referred to [facility] for services due to insurance.    Plan: Referred to [facility] for therapy services  Diagnosis: Adjustment Disorder with mixed emotional features  Collaboration of Care: will coordinate with [facility]  Patient was advised Release of Information must be obtained prior to any record release in order to collaborate their care with an outside provider. Patient was advised if they have not already done so to contact the registration department to sign all necessary forms in order for us  to release information regarding their care.   Consent: Patient gives verbal consent for treatment and assignment of benefits for services provided  during this visit. Patient expressed understanding and agreed to proceed.   [name], [place] 10/18/2024

## 2024-11-27 ENCOUNTER — Telehealth: Payer: Self-pay | Admitting: Pharmacist

## 2024-11-27 NOTE — Telephone Encounter (Signed)
 Attempted to contact patient for follow-up of glucose control and to request to reschedule.  Unable to reach - Voice Mail - Not set up.   Total time with patient call and documentation of interaction: 3 minutes.
